# Patient Record
Sex: Female | Born: 1960 | Race: White | Hispanic: No | Marital: Married | Smoking: Never smoker
Health system: Southern US, Community
[De-identification: ages and names within clinical notes are randomized; demographics above are authoritative.]

## PROBLEM LIST (undated history)

## (undated) DIAGNOSIS — F32A Depression, unspecified: Secondary | ICD-10-CM

## (undated) DIAGNOSIS — C50919 Malignant neoplasm of unspecified site of unspecified female breast: Secondary | ICD-10-CM

## (undated) DIAGNOSIS — K5090 Crohn's disease, unspecified, without complications: Secondary | ICD-10-CM

## (undated) DIAGNOSIS — Z8614 Personal history of Methicillin resistant Staphylococcus aureus infection: Secondary | ICD-10-CM

## (undated) DIAGNOSIS — J189 Pneumonia, unspecified organism: Secondary | ICD-10-CM

## (undated) DIAGNOSIS — E042 Nontoxic multinodular goiter: Secondary | ICD-10-CM

## (undated) DIAGNOSIS — J45909 Unspecified asthma, uncomplicated: Secondary | ICD-10-CM

## (undated) DIAGNOSIS — F419 Anxiety disorder, unspecified: Secondary | ICD-10-CM

## (undated) DIAGNOSIS — G4730 Sleep apnea, unspecified: Secondary | ICD-10-CM

## (undated) DIAGNOSIS — Z923 Personal history of irradiation: Secondary | ICD-10-CM

## (undated) DIAGNOSIS — M1990 Unspecified osteoarthritis, unspecified site: Secondary | ICD-10-CM

## (undated) DIAGNOSIS — C73 Malignant neoplasm of thyroid gland: Secondary | ICD-10-CM

## (undated) DIAGNOSIS — F329 Major depressive disorder, single episode, unspecified: Secondary | ICD-10-CM

## (undated) DIAGNOSIS — K219 Gastro-esophageal reflux disease without esophagitis: Secondary | ICD-10-CM

## (undated) HISTORY — DX: Malignant neoplasm of unspecified site of unspecified female breast: C50.919

## (undated) HISTORY — PX: COLONOSCOPY: SHX174

## (undated) HISTORY — DX: Nontoxic multinodular goiter: E04.2

---

## 1997-04-25 ENCOUNTER — Other Ambulatory Visit: Admission: RE | Admit: 1997-04-25 | Discharge: 1997-04-25 | Payer: Self-pay | Admitting: *Deleted

## 1997-05-30 ENCOUNTER — Emergency Department (HOSPITAL_COMMUNITY): Admission: EM | Admit: 1997-05-30 | Discharge: 1997-05-30 | Payer: Self-pay | Admitting: Emergency Medicine

## 1997-07-26 ENCOUNTER — Other Ambulatory Visit: Admission: RE | Admit: 1997-07-26 | Discharge: 1997-07-26 | Payer: Self-pay | Admitting: Dermatology

## 1998-05-23 ENCOUNTER — Other Ambulatory Visit: Admission: RE | Admit: 1998-05-23 | Discharge: 1998-05-23 | Payer: Self-pay | Admitting: *Deleted

## 1999-05-17 ENCOUNTER — Other Ambulatory Visit: Admission: RE | Admit: 1999-05-17 | Discharge: 1999-05-17 | Payer: Self-pay | Admitting: *Deleted

## 2000-05-19 ENCOUNTER — Other Ambulatory Visit: Admission: RE | Admit: 2000-05-19 | Discharge: 2000-05-19 | Payer: Self-pay | Admitting: *Deleted

## 2001-06-10 ENCOUNTER — Other Ambulatory Visit: Admission: RE | Admit: 2001-06-10 | Discharge: 2001-06-10 | Payer: Self-pay | Admitting: *Deleted

## 2001-10-22 ENCOUNTER — Encounter: Admission: RE | Admit: 2001-10-22 | Discharge: 2001-10-22 | Payer: Self-pay | Admitting: Gastroenterology

## 2001-10-22 ENCOUNTER — Encounter: Payer: Self-pay | Admitting: Gastroenterology

## 2002-01-14 HISTORY — PX: ANAL FISSURE REPAIR: SHX2312

## 2002-05-03 ENCOUNTER — Encounter (INDEPENDENT_AMBULATORY_CARE_PROVIDER_SITE_OTHER): Payer: Self-pay | Admitting: Specialist

## 2002-05-03 ENCOUNTER — Ambulatory Visit (HOSPITAL_COMMUNITY): Admission: RE | Admit: 2002-05-03 | Discharge: 2002-05-03 | Payer: Self-pay | Admitting: Gastroenterology

## 2002-06-01 ENCOUNTER — Ambulatory Visit (HOSPITAL_COMMUNITY): Admission: AD | Admit: 2002-06-01 | Discharge: 2002-06-01 | Payer: Self-pay | Admitting: General Surgery

## 2002-06-01 ENCOUNTER — Encounter (INDEPENDENT_AMBULATORY_CARE_PROVIDER_SITE_OTHER): Payer: Self-pay | Admitting: Specialist

## 2002-06-07 ENCOUNTER — Encounter: Admission: RE | Admit: 2002-06-07 | Discharge: 2002-06-07 | Payer: Self-pay | Admitting: Gastroenterology

## 2002-06-07 ENCOUNTER — Encounter: Payer: Self-pay | Admitting: Gastroenterology

## 2002-06-11 ENCOUNTER — Encounter: Admission: RE | Admit: 2002-06-11 | Discharge: 2002-06-11 | Payer: Self-pay | Admitting: Gastroenterology

## 2002-06-11 ENCOUNTER — Encounter: Payer: Self-pay | Admitting: Gastroenterology

## 2002-06-29 ENCOUNTER — Ambulatory Visit (HOSPITAL_COMMUNITY): Admission: RE | Admit: 2002-06-29 | Discharge: 2002-06-29 | Payer: Self-pay | Admitting: Gastroenterology

## 2002-07-13 ENCOUNTER — Encounter (HOSPITAL_COMMUNITY): Admission: RE | Admit: 2002-07-13 | Discharge: 2002-10-11 | Payer: Self-pay | Admitting: Gastroenterology

## 2002-08-24 ENCOUNTER — Other Ambulatory Visit: Admission: RE | Admit: 2002-08-24 | Discharge: 2002-08-24 | Payer: Self-pay | Admitting: *Deleted

## 2003-06-15 ENCOUNTER — Ambulatory Visit (HOSPITAL_COMMUNITY): Admission: RE | Admit: 2003-06-15 | Discharge: 2003-06-15 | Payer: Self-pay | Admitting: Gastroenterology

## 2003-06-15 ENCOUNTER — Encounter (INDEPENDENT_AMBULATORY_CARE_PROVIDER_SITE_OTHER): Payer: Self-pay | Admitting: Specialist

## 2003-06-17 ENCOUNTER — Encounter (HOSPITAL_COMMUNITY): Admission: RE | Admit: 2003-06-17 | Discharge: 2003-09-14 | Payer: Self-pay | Admitting: Gastroenterology

## 2003-10-14 ENCOUNTER — Encounter (HOSPITAL_COMMUNITY): Admission: RE | Admit: 2003-10-14 | Discharge: 2003-10-18 | Payer: Self-pay | Admitting: Gastroenterology

## 2004-06-25 ENCOUNTER — Emergency Department (HOSPITAL_COMMUNITY): Admission: EM | Admit: 2004-06-25 | Discharge: 2004-06-25 | Payer: Self-pay | Admitting: Emergency Medicine

## 2004-06-26 ENCOUNTER — Inpatient Hospital Stay (HOSPITAL_COMMUNITY): Admission: AD | Admit: 2004-06-26 | Discharge: 2004-06-28 | Payer: Self-pay | Admitting: Gastroenterology

## 2004-06-28 ENCOUNTER — Encounter (INDEPENDENT_AMBULATORY_CARE_PROVIDER_SITE_OTHER): Payer: Self-pay | Admitting: *Deleted

## 2004-10-17 ENCOUNTER — Encounter: Admission: RE | Admit: 2004-10-17 | Discharge: 2004-10-17 | Payer: Self-pay | Admitting: Gastroenterology

## 2005-01-14 DIAGNOSIS — K5090 Crohn's disease, unspecified, without complications: Secondary | ICD-10-CM | POA: Insufficient documentation

## 2005-11-21 ENCOUNTER — Encounter: Admission: RE | Admit: 2005-11-21 | Discharge: 2005-11-21 | Payer: Self-pay | Admitting: Orthopedic Surgery

## 2006-04-14 ENCOUNTER — Other Ambulatory Visit: Admission: RE | Admit: 2006-04-14 | Discharge: 2006-04-14 | Payer: Self-pay | Admitting: Internal Medicine

## 2008-01-15 DIAGNOSIS — L853 Xerosis cutis: Secondary | ICD-10-CM | POA: Insufficient documentation

## 2008-04-29 ENCOUNTER — Ambulatory Visit: Payer: Self-pay | Admitting: Internal Medicine

## 2008-04-29 ENCOUNTER — Other Ambulatory Visit: Admission: RE | Admit: 2008-04-29 | Discharge: 2008-04-29 | Payer: Self-pay | Admitting: Internal Medicine

## 2008-05-27 ENCOUNTER — Ambulatory Visit: Payer: Self-pay | Admitting: Internal Medicine

## 2008-07-11 ENCOUNTER — Ambulatory Visit: Payer: Self-pay | Admitting: Internal Medicine

## 2009-03-06 ENCOUNTER — Ambulatory Visit: Payer: Self-pay | Admitting: Internal Medicine

## 2009-03-14 ENCOUNTER — Ambulatory Visit (HOSPITAL_BASED_OUTPATIENT_CLINIC_OR_DEPARTMENT_OTHER): Admission: RE | Admit: 2009-03-14 | Discharge: 2009-03-14 | Payer: Self-pay | Admitting: Orthopedic Surgery

## 2009-11-03 ENCOUNTER — Ambulatory Visit: Payer: Self-pay | Admitting: Cardiovascular Disease

## 2010-01-14 DIAGNOSIS — H409 Unspecified glaucoma: Secondary | ICD-10-CM | POA: Insufficient documentation

## 2010-02-04 ENCOUNTER — Encounter: Payer: Self-pay | Admitting: Orthopedic Surgery

## 2010-02-04 ENCOUNTER — Encounter: Payer: Self-pay | Admitting: Gastroenterology

## 2010-02-12 ENCOUNTER — Ambulatory Visit
Admission: RE | Admit: 2010-02-12 | Discharge: 2010-02-12 | Payer: Self-pay | Source: Home / Self Care | Attending: Internal Medicine | Admitting: Internal Medicine

## 2010-03-02 ENCOUNTER — Ambulatory Visit: Payer: Self-pay | Admitting: Internal Medicine

## 2010-04-05 ENCOUNTER — Other Ambulatory Visit: Payer: Self-pay | Admitting: Cardiology

## 2010-04-05 ENCOUNTER — Ambulatory Visit (INDEPENDENT_AMBULATORY_CARE_PROVIDER_SITE_OTHER): Payer: Commercial Managed Care - PPO | Admitting: *Deleted

## 2010-04-05 DIAGNOSIS — R0789 Other chest pain: Secondary | ICD-10-CM

## 2010-04-05 DIAGNOSIS — Z79899 Other long term (current) drug therapy: Secondary | ICD-10-CM

## 2010-04-05 DIAGNOSIS — E7800 Pure hypercholesterolemia, unspecified: Secondary | ICD-10-CM

## 2010-04-06 LAB — COMPREHENSIVE METABOLIC PANEL
ALT: 14 U/L (ref 0–35)
Albumin: 4.4 g/dL (ref 3.5–5.2)
Calcium: 9.3 mg/dL (ref 8.4–10.5)
Creat: 0.74 mg/dL (ref 0.40–1.20)
Glucose, Bld: 81 mg/dL (ref 70–99)
Sodium: 138 mEq/L (ref 135–145)
Total Protein: 7.2 g/dL (ref 6.0–8.3)

## 2010-04-06 LAB — CBC WITH DIFFERENTIAL/PLATELET
Basophils Absolute: 0.1 10*3/uL (ref 0.0–0.1)
HCT: 41 % (ref 36.0–46.0)
Hemoglobin: 12.9 g/dL (ref 12.0–15.0)
Lymphs Abs: 1.9 10*3/uL (ref 0.7–4.0)
MCH: 30.6 pg (ref 26.0–34.0)
Neutrophils Relative %: 46 % (ref 43–77)
Platelets: 251 10*3/uL (ref 150–400)
WBC: 4.7 10*3/uL (ref 4.0–10.5)

## 2010-04-06 LAB — ALKALINE PHOSPHATASE: Alkaline Phosphatase: 36 U/L — ABNORMAL LOW (ref 39–117)

## 2010-04-06 LAB — LIPID PANEL
Cholesterol: 217 mg/dL — ABNORMAL HIGH (ref 0–200)
HDL: 76 mg/dL (ref >39)
LDL Cholesterol: 132 mg/dL — ABNORMAL HIGH (ref 0–99)
Triglycerides: 44 mg/dL (ref <150)

## 2010-04-06 NOTE — Progress Notes (Signed)
Quick Note:  Blood work ok ______

## 2010-04-08 LAB — ANAEROBIC CULTURE: Gram Stain: NONE SEEN

## 2010-04-08 LAB — TISSUE CULTURE: Gram Stain: NONE SEEN

## 2010-04-08 LAB — WOUND CULTURE

## 2010-06-01 NOTE — Op Note (Signed)
[name], BELKNAP                        ACCOUNT NO.:  [id number]   MEDICAL RECORD NO.:  [id number]                   PATIENT TYPE:  AMB   LOCATION:  ENDO                                 FACILITY:  [facility]   PHYSICIAN:  Ronald Lobo, M.D.                DATE OF BIRTH:  [birth year]   DATE OF PROCEDURE:  06/15/2003  DATE OF DISCHARGE:                                 OPERATIVE REPORT   PROCEDURE:  Colonoscopy with biopsies.   INDICATION:  This is a 50 year old female with a history of significant  Crohn's colitis, responding in the past to Remicade.  She is maintained on  6MP.  She had been doing well for the past year or so, with her most recent  colonoscopy seven months ago showing no colitis activity whatsoever.  However, over the past couple of months, she has had a gradual but  progressive recrudescence of symptoms, with crampy abdominal discomfort,  increased frequency of bowel movements, and passage of some bloody mucous.   FINDINGS:  Distal proctitis with minimal inflammation of the ileocecal  valve.   PROCEDURE:  The nature, purpose and risks of the procedure were familiar to  the patient from prior examinations and she provided written consent.  Sedation was fentanyl 112.5 mcg and Versed 11 mg IV without arrhythmias or  desaturation.  The patient was comfortably and adequately sedated throughout  the procedure.  Perianal exam was unremarkable, specifically no fissures,  fistulae, abscess or other abnormalities were identified, just some skin  tags.   The Olympus adjustable tension pediatric video colonoscope was advanced to  the terminal ileum, turning the patient into the supine position and  applying some external abdominal compression to control looping.   The terminal ileum was entered for a distance of about 5-10 cm and had a  normal appearance.  Biopsies were obtained for histologic confirmation.   In the cecum, there was a little bit of erythema with minimal  granularity  and exudate around the orifice of the appendix, and the ileocecal valve,  especially on its roof, had granularity, exudate and superficial  ulcerations.  That was biopsied as well.   __________ was then performed around the colon.  As before, I saw some  inflammatory nodules in the mid colon.   The patient had a solitary diverticulum observed in the sigmoid region.   The only other area of inflammation apart from the cecum was in the distal  rectum, from the anal verge in a confluent fashion up to about 10 cm,  characterized by fairly intense erythema, exudate and granularity, probably  a severity of 6, or perhaps even 7, on a scale of 10.  Above 10 cm, the  proximal rectum looked normal.   Several biopsies were obtained from the normal and the inflamed sections of  the rectum for comparison.  Retroflexion was not performed in the rectum.   The patient tolerated the  procedure well and there were no apparent  complications.   IMPRESSION:  1. Crohn's colitis, with moderate but very localized activity at the present     time, confined to the distal rectum and the ileocecal valve and     appendiceal orifice (555.1).  2. Minimal diverticular change.   PLAN:  Consider aggressive topical therapy for the proctitis with  consideration of Remicade if the patient continues to have symptoms of  malaise, abdominal discomfort, hip pain, etc.                                               Ronald Lobo, M.D.    RB/MEDQ  D:  06/15/2003  T:  06/15/2003  Job:  [id number]   cc:   Cresenciano Lick. Renold Genta, M.D.  [address] [place]  [place] [address]  Fax: [phone]

## 2010-06-01 NOTE — Op Note (Signed)
[name], [name]                        ACCOUNT NO.:  [id number]   MEDICAL RECORD NO.:  [id number]                   PATIENT TYPE:  AMB   LOCATION:  DAY                                  FACILITY:  [facility]   PHYSICIAN:  Timothy E. Rosana Hoes, M.D.              DATE OF BIRTH:  01-Nov-1960   DATE OF PROCEDURE:  06/01/2002  DATE OF DISCHARGE:                                 OPERATIVE REPORT   PREOPERATIVE DIAGNOSES:  1. Multiple perirectal abscesses.  2. Anal fissure Crohn's disease.   POSTOPERATIVE DIAGNOSES:  1. Multiple perirectal abscesses.  2. Anal fissure Crohn's disease.   PROCEDURES:  1. Examination under anesthesia.  2. Multiple biopsies.  3. Incision and drainage of abscesses.   SURGEON:  Timothy E. Rosana Hoes, M.D.   ANESTHESIA:  General.   CLINICAL HISTORY:  I met the patient yesterday afternoon.  She has recently  been diagnosed with Crohn's disease and placed on oral medications.  In  spite of that, for the past several weeks she has felt not well, has eaten  little, and has had intensifying anorectal pain.  The past three days she  had had the appearance of an abscess with partial spontaneous drainage.  When seen in the office, the abscesses were noted, the anal fissure was  noted, and drainage was recommended.  She is in agreement, as well her  husband, [name], M.D.   DESCRIPTION OF PROCEDURE:  No prep was accomplished.  The patient was  identified and the permit signed.  Her laboratory data were noted with a low  albumin, low potassium, low hemoglobin and hematocrit.   She was taken to the operating room and placed supine, LMA anesthesia  provided.  She was placed in lithotomy.  The perianal area inspected,  prepped and draped in the usual fashion.  Careful inspection revealed no  involvement of the vagina.  Apparently there was a right anterior perirectal  abscess in the 10 o'clock position.  A gentle probe with a malleable probe  revealed no evidence  of fistulization.  There was a tiny abscess in the left  lateral position, and this was just outside the intense left lateral  fissure.  Again probing revealed no evidence of fistulization.  The fissure  was intense.  The base was approximately 15 mm across.  The edges were  heaped up between 5 and 8 mm over the edges of this juicy granulomatous  tissue typical of Crohn's disease.  The fissure was over 3 cm in  length.  I  was able to introduce an anoscope without difficulty and examine this more  closely.  I made two biopsies of the granulation tissue around the fissure.  I also biopsied the right anterior abscess cavity.  These were all submitted  for routine pathology.   I incised and drained the right anterior abscess, debrided it, and then  cauterized the base.  The left  lateral abscess was opened only for a  distance of about 6-7 mm, and it was as well debrided.  I was able to  cauterize the areas on the fissure internally where the biopsies were made,  and I provided no further treatment for the fissure at this time.  The  wounds were dry.  The patient was stable.  Gelfoam, gauze, and dry sterile  dressing applied.  She tolerated it well, was removed to the recovery room  in good condition.   The patient has Vicodin and antibiotic to take at home.  She will be  followed closely in the office.                                               Timothy E. Rosana Hoes, M.D.    TED/MEDQ  D:  06/01/2002  T:  06/01/2002  Job:  [id number]   cc:   Ronald Lobo, M.D.  [address] [place]  [place], [place] [address]  Fax: [phone]

## 2010-06-01 NOTE — Op Note (Signed)
[name], [name]              ACCOUNT NO.:  [id number]   MEDICAL RECORD NO.:  [id number]          PATIENT TYPE:  INP   LOCATION:  5708                         FACILITY:  [facility]   PHYSICIAN:  Ronald Lobo, M.D.   DATE OF BIRTH:  [birth year]   DATE OF PROCEDURE:  06/28/2004  DATE OF DISCHARGE:                                 OPERATIVE REPORT   PROCEDURE:  Colonoscopy with biopsies.   INDICATIONS:  This is a 50 year old female with a two-year history of  Crohn's colitis, managed over the past 6 months with Humira, with very good  control of symptoms until about a month ago when she began noticing an  increased frequency of bowel movements and some degree of looseness of  stools, without bleeding, significant pain, etc.. She was admitted to the  hospital two days ago with a 48-hour history of right upper quadrant  abdominal pain and thoracic pleuritic pain of unclear cause which, in  retrospect, might be due to be a viral pleuritis.   FINDINGS:  Essentially normal exam.   PROCEDURE:  The nature, purpose, and risks of the procedure were familiar to  the patient from prior examinations and she provided written consent.  Sedation was fentanyl 50 mcg, Versed 7.5 milligrams and Phenergan 12.5  milligrams IV without arrhythmias or desaturation. The Olympus adjustable  tension pediatric video colonoscope was advanced without significant  difficulty to the terminal ileum which had a normal appearance. It was  examined for perhaps 5 to 8 cm or so and appeared normal. Pullback was then  performed. The quality of prep was excellent and it is felt that all areas  were well seen. There was a solitary left side diverticulum and perhaps a  few tiny inflammatory polyps or nodules near the distal transverse colon,  but apart from that, this was a remarkably normal examination with good  vascularity and no evidence of colitis activity such as erythema, loss of  vascularity, exudate, ulcerations,  granularity, pseudomembranes, etc..   Random biopsies were obtained from the terminal ileum and around the colon  for histologic analysis.   No obvious colonic polyps nor any masses were observed. I did not see any  vascular ectasia. Retroflexion the rectum was attempted but could not be  accomplished due to a small rectal ampulla.   The patient tolerated the procedure well and there no apparent  complications.   IMPRESSION:  1.  Crohn's colitis, currently endoscopically quiescent (555.1).  2.  Solitary left side diverticulum, perhaps a few tiny inflammatory polyps      present.  3.  No obvious source for one-month history of altered bowel habit noted.   PLAN:  Await pathology results. Continue Humira for now.       RB/MEDQ  D:  06/28/2004  T:  06/28/2004  Job:  [id number]   cc:   Cresenciano Lick. Renold Genta, M.D.  [address] [place] [address]  Fax: [phone]   Dr. Kristie Cowman, [facility] [place]

## 2010-06-01 NOTE — Discharge Summary (Signed)
NAMELENELL, MCCONNELL              ACCOUNT NO.:  [id number]   MEDICAL RECORD NO.:  [id number]          PATIENT TYPE:  INP   LOCATION:  5708                         FACILITY:  [facility]   PHYSICIAN:  Ronald Lobo, M.D.   DATE OF BIRTH:  1960/05/18   DATE OF ADMISSION:  06/26/2004  DATE OF DISCHARGE:  06/28/2004                                 DISCHARGE SUMMARY   FINAL DIAGNOSES:  1.  Right upper quadrant pain and right thoracic pain, etiology      undetermined, resolved.  2.  Small pleural effusion with right lower lobe atelectasis.  3.  Crohn's colitis of two years' duration, currently quiescent.  4.  Past history of questionable achalasia without radiographic confirmation      on most recent testing.   CONSULTATIONS:  Danton Sewer, M.D., pulmonary medicine.   COMPLICATIONS:  None.   PROCEDURES:  1.  CT of chest, abdomen and pelvis.  2.  Perfusion scan of the lungs.  3.  Colonoscopy with biopsies.   LABORATORY DATA:  Admission white count was 9500, discharge white count  5800.  Admission differential unremarkable with 77 polys, 13 lymphs.  Hemoglobin 13.1 on admission, 11.7 following hydration.  Platelets normal at  221,000.  Sedimentation rate slightly elevated at 31.  Chemistry panel  essentially within normal limits with BUN of 4 on admission, creatinine 0.7.  Liver chemistries normal.  Albumin 3.5 on admission and 2.6 prior to  discharge.  Lipase repeatedly normal.  Urinalysis showed moderate dipstick  blood but just 0-2 red cells, and this was felt most likely to be perineal  contamination from menses.   HOSPITAL COURSE:  [name] was admitted urgently because of a two-day history  of fairly significant right upper quadrant pleuritic pain which came on  rather abruptly and was associated with some degree of shortness of breath  and food intolerance.  As an outpatient in the emergency room, the patient  had had an ultrasound  that was negative for stones despite a family  history  of gallbladder disease, and a hepatobiliary scan that was also negative for  any evidence of acute biliary obstruction or cholecystitis.  Despite normal  objective testing such as white count and the absence of fever, in view of  the ongoing symptoms and considering the patient is on immunosuppressive  therapy with Humira (antitumor necrosis factor antibody), careful inpatient  monitoring was felt to be warranted, not to mention the need for better  control of symptoms of pain.   On admission, the patient had impressive guarding of the right upper  quadrant.  A CT of the chest, abdomen and pelvis was obtained.  The CT of  the chest was done on the pulmonary embolism protocol and was negative for  any evidence of PEs but a small pleural effusion was noted along with some  atelectasis in the right lower lobe, raising impression is to whether some  very small subsegmental emboli could have occurred.  Venous Dopplers of the  lower extremities were performed and were negative and prior to discharge,  after obtaining pulmonary consultation, a perfusion scan of  the lungs was  obtained which was negative.  Thus, it is felt that pulmonary embolism, in  particular of any significance, was highly unlikely in view of the overall  picture.   The CT of the abdomen and pelvis did not show any bowel abnormalities or  evidence of colitis activity or other problems to account for the patient's  symptoms.   Fortunately, during the patient's 48-hour stay, there was a progressive  decrease in symptoms such that she did not need pain medication nor have any  night sweats on the evening prior to discharge and by the day of discharge,  her abdomen was completely nontender.   A colonoscopy was performed prior to discharge in view of the fact that, for  the preceding month, the patient had had an increased frequency of bowel  movements, but this exam was really normal all the way to the terminal   ileum, with random biopsies pending.   In view of the patient's clinical improvement and the absence of any evident  disease process, discharge was felt to be clinically appropriate.   TESTS PENDING AT TIME OF DISCHARGE:  Blood culture, PPD (negative at 24  hours).   DISPOSITION:  Discharge home.  Regular diet.  Office follow-up with me will  probably be arranged for a month or two from now to monitor the patient's  progress.   DISCHARGE MEDICATIONS:  The patient will continue her outpatient  medications, which included:  1.  Humira 40 milligrams subcutaneously once weekly.  2.  Zoloft 50 mg daily.  3.  Yasmin  on a daily basis.   COMMENT:  On balance, it is felt that this was most likely a viral pleuritis  but no definite or specific diagnosis could ever be proven in this patient.   CONDITION ON DISCHARGE:  Improved.       RB/MEDQ  D:  06/28/2004  T:  06/28/2004  Job:  [id number]   cc:   Cresenciano Lick. Renold Genta, M.D.  [address]  [place]  [place] [address]  Fax: [phone]   Danton Sewer, M.D. [facility]   Dr. Ellender Hose

## 2010-06-01 NOTE — H&P (Signed)
[name], [name] NO.:  [id number]   MEDICAL RECORD NO.:  [id number]          PATIENT TYPE:  INP   LOCATION:  5708                         FACILITY:  [facility]   PHYSICIAN:  Ronald Lobo, M.D.   DATE OF BIRTH:  [birth year]   DATE OF ADMISSION:  06/26/2004  DATE OF DISCHARGE:                                HISTORY & PHYSICAL   This 50 year old female is being admitted to the hospital because of  abdominal pain.   [name] has a two-year history of inflammatory bowel disease felt most likely  to be Crohn colitis, for which therapy over the past couple of years has  included 6-MP, Remicade, and methotrexate.  Due to medically refractory  disease, she was placed on Humira last November through Dr. Ellender Hose at [facility] and has had a fairly good response.  Over the past month or so, she has  had a mild flare up of symptoms characterized by low abdominal cramps and  diarrhea three or four times a day without bleeding or nocturnal  symptomatology and without any significant decrement in her normal  functional status, in that she is able, for example, to continue playing  tennis on a regular basis, although she does not feel she has quite her  usual pep and energy.  Note that the patient had been co-treated with  prednisone for several months and this was tapered to zero as of about six  weeks ago.  With that background, approximately 48 hours ago the patient had the fairly  abrupt onset of rather diffuse right sided abdominal pain involving the  right upper quadrant of the abdomen and the right chest with some pleuritic  right sided chest pain, although nothing really dramatic, noted only on deep  breathing.  She might have some slight exertional dyspnea but it is hard to  tell whether she is primarily short of breath or the chest discomfort cuts  her breath off somewhat but again this is a relatively moderate symptom for  the patient.  It does hurt for her to move  around, sit up, reach over, and  so forth suggesting a musculoskeletal component to exacerbation of these  symptoms whereas they do not seem to be related to food intake.  However,  she really has not eaten much over the past two days since the symptoms  began.  Yesterday afternoon, she had a temp of 100.5 degrees.  She has not been  coughing, nor has she been around anybody with respiratory ailments.  Because of these symptoms, the patient was directed yesterday afternoon to  the emergency room where she was evaluated and noted to have normal  laboratories including a normal CBC and normal liver chemistries.  An  ultrasound was obtained which showed no gallstones, although the patient has  a family history of gallbladder disease, so she was then sent for a  hepatobiliary scan which was also normal.  She was sent home on Percocet but  has continued to have pain especially when the Percocet wears off, and  really she does not feel any better.  Because of  these symptoms, inpatient  evaluation was felt to be warranted.   PAST MEDICAL HISTORY:  No known allergies.   OUTPATIENT MEDICATIONS:  1.  Yasmin (this is her week of plain pills).  2.  Zoloft 50 mg daily.  3.  Humira 40 mg subcutaneously once a week.   OPERATIONS:  No abdominal surgery but has had drainage of a perirectal  abscess, by Dr. Lennie Hummer, approximately two years ago.   MEDICAL ILLNESSES:  1.  There is a questionable history of achalasia based on dysphagia symptoms      and an abnormal barium swallow at the [facility] about ten years      ago; however, a more recent barium swallow done here about seven years      ago did not show evidence of achalasia.  The patient has had a solitary      episode of a food impaction with a questionable esophageal ring.  No      known cardiopulmonary disease, diabetes, or hypertension, or other      medical illnesses.  2.  She has been treated with Zoloft for some depressive  symptomatology.  3.  She had Clostridium difficile colitis in August 2001.   HABITS:  I believe the patient is both a nonsmoker and just occasional user  of ethanol.   FAMILY HISTORY:  Positive for gallbladder disease.   SOCIAL HISTORY:  Married, homemaker.   REVIEW OF SYSTEMS:  No problem with weight loss of loss of appetite.  On the  contrary, has been struggling not to gain weight especially on the  prednisone.  No recent rectal bleeding but has had a mild flare up of bowel  symptoms over the past month as noted.  The flare up of abdominal symptoms  does not seem to have increased over the past 48 hours since she developed  this right sided discomfort as described above.   PHYSICAL EXAMINATION:  GENERAL:  [name] is a healthy-appearing female in no  evident distress and appearing neither anxious or depressed.  VITAL SIGNS:  Temperature 98.2, blood pressure 109/58, pulse 66,  respirations 20 and unlabored.  HEENT:  She is anicteric.  SKIN:  Rather pale but warm and dry.  Note that this is despite a normal  hemoglobin of 13.1 when checked in the emergency room yesterday.  CHEST:  Clear to auscultation without any wheezes or pleural rubs.  HEART:  Normal without gallops, rubs, murmurs, clicks, or arrhythmias.  ABDOMEN:  Has bowel sounds present without any obvious bruits but there is  quite a bit of guarding and firmness and tenderness on the right side of the  abdomen, especially the right upper quadrant without any discrete mass  effect or peritoneal findings.   LABS:  Obtained in the emergency room approximately 24 hours ago, these  showed a white count of 9500, hemoglobin 13.1, platelets 221,000,  differential normal with 77% polys, 13 lymphs, and 10 monocytes.  Chemistries unremarkable with particular reference to liver chemistries  which were entirely within normal limits and albumin was normal at 3.5,  lipase normal at 27.  Urinalysis showed mild ketones but was positive  for DIP stick blood but only 0-2 red cells (note that the patient is on her  period this week).   IMPRESSION:  1.  Right sided upper quadrant and lower chest pain with questionable      pleuritic component.  The differential diagnosis might include pulmonary      embolism (for which  she is only at risk based on her Crohn disease, no      factors such as obesity, immobility, recent surgery, etcetera),      acalculous gallbladder disease, or perhaps an atypical manifestation of      renal colic.  2.  Crohn colitis of about two years' duration on immunosuppression with      Humira (anti TNF).  3.  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  4.  DIP stick hematuria, probably due to contamination from perineal blood.   PLAN:  1.  CT of chest, abdomen, and pelvis.  2.  If the above is unrevealing, get updated blood work and arrange for a      CCK stimulated hepatobiliary scan.  3.  Surgical consultation will be considered if appropriate.       RB/MEDQ  D:  06/26/2004  T:  06/26/2004  Job:  [id number]   cc:   Cresenciano Lick. Renold Genta, M.D.  [address] [place] [address]  Fax: [phone]   Kristie Cowman, Dr.  Division of Digestive Diseases  [facility] [place], [place] [address]

## 2010-06-01 NOTE — Op Note (Signed)
[name], [name]                        ACCOUNT NO.:  [id number]   MEDICAL RECORD NO.:  [id number]                   PATIENT TYPE:  AMB   LOCATION:  ENDO                                 FACILITY:  [facility]   PHYSICIAN:  Ronald Lobo, M.D.                DATE OF BIRTH:  29-Mar-1960   DATE OF PROCEDURE:  05/03/2002  DATE OF DISCHARGE:                                 OPERATIVE REPORT   PROCEDURE PERFORMED:  Colonoscopy with biopsies.   ENDOSCOPIST:  Cleotis Nipper, M.D.   INDICATIONS FOR PROCEDURE:  The patient is a 50 year old female with  longstanding history of what was thought to be irritable bowel syndrome who  recently was found to have inflammatory changes on a colonoscopy done as  part of preparation for an IBS study.  She has been placed on Asacol and  various other medications but so far has had progression of symptoms.  Further clarification of her status was felt to be warranted.   FINDINGS:  Patchy colitis consistent with a mixed picture of Crohn's colitis  and ulcerative colitis.   DESCRIPTION OF PROCEDURE:  The patient provided written consent for the  procedure.  Sedation was fentanyl 110 mcg and Versed 14 mg IV prior to and  during the course of the procedure without arrhythmias or desaturation.   The Olympus adjustable tension pediatric video colonoscope was advanced  around the colon to the terminal ileum which was entered for a short  distance and had a normal appearance.  Advancement was fairly easily  accomplished.  Biopsies were obtained of the terminal ileum prior to  pullback.  There were patchy inflammatory changes throughout the colon, as  described below.   In the ascending colon, the surrounding mucosa was normal but there was a  deep 2 to 3 cm diameter ulceration with surrounding mucosal erythema and  edema.  This solitary ulceration was the only inflammatory change in the  proximal ascending colon.  In the region of the hepatic flexure  and  transverse colon, there was a fairly lengthy (probably 30 cm) segment of  colitis with the appearance of ulcerative colitis, characterized by loss of  mucosal vascularity, and the presence of punctate erythema, and granularity.  There was no significant exudate, no significant friability, so I would rate  this roughly a level 3 on a scale of 10 in terms of severity of  inflammation.  In the left colon, the mucosa was more normal in appearance  if not frankly normal.  There was preserved vascularity.  There was a rare  diverticulum in the left colon.   In the rectum, inflammatory changes were again encountered with mild distal  proctitis (level 2 out of 10) with essentially no inflammation in the  proximal rectum.  Biopsies were obtained from the ulceration in the inflamed  segment and the terminal ileum.  Retroflexion was not performed in the  rectum.  The patient did have a large skin tag versus sentinel pile in the  perianal area.   The patient did not have any obvious fissures, fistulae or abscesses in the  perianal area although a detailed inspection was not performed.  The patient  tolerated the procedure well and there were no apparent complications.   IMPRESSION:  Nonspecific colitis as described above.  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    PLAN:  Await pathology on today's biopsies and intensify therapy by adding  Entocort 9 mg each morning and increasing Asacol from eight  tablets a day  to 12 tablets a day if she is not already on that dose.                                                Ronald Lobo, M.D.    RB/MEDQ  D:  05/03/2002  T:  05/04/2002  Job:  [id number]   cc:   Cresenciano Lick. Renold Genta, M.D.  [address] [place]  [place] [address]  Fax: [phone]

## 2010-08-07 ENCOUNTER — Encounter: Payer: Self-pay | Admitting: Internal Medicine

## 2010-08-23 ENCOUNTER — Encounter: Payer: Self-pay | Admitting: Internal Medicine

## 2010-12-17 DIAGNOSIS — K621 Rectal polyp: Secondary | ICD-10-CM

## 2010-12-17 DIAGNOSIS — K620 Anal polyp: Secondary | ICD-10-CM | POA: Insufficient documentation

## 2010-12-17 DIAGNOSIS — K639 Disease of intestine, unspecified: Secondary | ICD-10-CM | POA: Insufficient documentation

## 2011-01-21 ENCOUNTER — Ambulatory Visit (INDEPENDENT_AMBULATORY_CARE_PROVIDER_SITE_OTHER): Payer: Commercial Managed Care - PPO | Admitting: Pharmacist

## 2011-01-21 ENCOUNTER — Encounter: Payer: Self-pay | Admitting: Pharmacist

## 2011-01-21 DIAGNOSIS — L853 Xerosis cutis: Secondary | ICD-10-CM

## 2011-01-21 DIAGNOSIS — K5090 Crohn's disease, unspecified, without complications: Secondary | ICD-10-CM

## 2011-01-21 DIAGNOSIS — L258 Unspecified contact dermatitis due to other agents: Secondary | ICD-10-CM

## 2011-01-21 DIAGNOSIS — H409 Unspecified glaucoma: Secondary | ICD-10-CM

## 2011-01-21 NOTE — Assessment & Plan Note (Signed)
Following medication review, no suggestions for change.  Complete medication list provided to patient.  Total time in face to face medication review: 20 minutes.  Patient seen with: James Ivanoff, PharmD Candidate and Laurey Morale, Pharmacy Resident

## 2011-01-21 NOTE — Progress Notes (Signed)
  Subjective:    Patient ID: [name], female    DOB: 1960/02/03, 51 y.o.   MRN: [id number]  HPI Patient arrives in good spirits for medication review.  Reports seeing Dr. Tommie Ard [name] as primary care provider, Dr. Kristie Cowman [facility]) and Dr. Cristina Gong for GI, Dr. Ubaldo Glassing for dematology, Dr. Earl Gala for ophthalmology. Reports being diagnosed with Crohn's since 2007 and states she is under acceptable level of control.      Review of Systems     Objective:   Physical Exam        Assessment & Plan:  Following medication review, no suggestions for change.  Complete medication list provided to patient.  Total time in face to face medication review: 20 minutes.  Patient seen with: James Ivanoff, PharmD Candidate and Laurey Morale, Pharmacy Resident

## 2011-01-21 NOTE — Progress Notes (Signed)
  Subjective:    Patient ID: [name], female    DOB: 1960-12-12, 51 y.o.   MRN: [id number]  HPI  Reviewed and agree with Dr. Graylin Shiver management.     Review of Systems     Objective:   Physical Exam        Assessment & Plan:

## 2012-02-07 ENCOUNTER — Ambulatory Visit: Payer: Commercial Managed Care - PPO | Admitting: Pharmacist

## 2012-02-13 ENCOUNTER — Encounter: Payer: Self-pay | Admitting: Pharmacist

## 2012-02-13 ENCOUNTER — Ambulatory Visit (INDEPENDENT_AMBULATORY_CARE_PROVIDER_SITE_OTHER): Payer: Self-pay | Admitting: Pharmacist

## 2012-02-13 VITALS — BP 99/56 | HR 69 | Ht 63.0 in | Wt 138.0 lb

## 2012-02-13 DIAGNOSIS — K5090 Crohn's disease, unspecified, without complications: Secondary | ICD-10-CM

## 2012-02-13 NOTE — Assessment & Plan Note (Signed)
Following medication review, no suggestions for change.  Complete medication list provided to patient.  Total time in face to face medication review: 12 minutes.  Patient seen with: Doris Cheadle PharmD, Pharmacy Resident.

## 2012-02-13 NOTE — Patient Instructions (Addendum)
Thanks for coming in today!

## 2012-02-13 NOTE — Progress Notes (Signed)
  Subjective:    Patient ID: [name], female    DOB: February 11, 1960, 52 y.o.   MRN: [id number]  HPI  Patient arrives in good spirits for medication review. Reports seeing Dr. Eden Emms [name] as primary care provider, Dr. Cedric Fishman [facility]) for GI, Dr. Nicholas Lose for dermatology, Dr. Abel Presto for ophthalmology, Dr. Mitzi Hansen with [facility]. Reports being diagnosed with Crohn's since 2007 and states she is under acceptable level of control.   Review of Systems     Objective:   Physical Exam        Assessment & Plan:  Following medication review, no suggestions for change.  Complete medication list provided to patient.  Total time in face to face medication review: 12 minutes.  Patient seen with: Doris Cheadle PharmD, Pharmacy Resident.

## 2012-02-14 NOTE — Progress Notes (Signed)
Patient ID: [name], female   DOB: December 05, 1960, 52 y.o.   MRN: [id number] Reviewed:  Agree with Dr Graylin Shiver documentation and management.

## 2012-08-13 ENCOUNTER — Other Ambulatory Visit: Payer: Self-pay | Admitting: Dermatology

## 2013-11-15 ENCOUNTER — Other Ambulatory Visit: Payer: Self-pay | Admitting: Radiology

## 2013-11-24 ENCOUNTER — Telehealth: Payer: Self-pay

## 2013-11-24 NOTE — Telephone Encounter (Signed)
Patient called requesting hepatitis b vaccine.  She had some labs done that show she is not immune to hep b.  She would like to know if this is something she should get.  Her [facility] providers advised her to do so.  Please advise.

## 2013-11-24 NOTE — Telephone Encounter (Signed)
I have contacted her and told her it would be a 3 dose series over 6 months. I believe we have one dose here. Please check and if so, she may have that dose. Once given order one more dose to have on hand.  Second dose in 2 months.

## 2013-11-25 ENCOUNTER — Ambulatory Visit (INDEPENDENT_AMBULATORY_CARE_PROVIDER_SITE_OTHER): Payer: 59 | Admitting: Internal Medicine

## 2013-11-25 DIAGNOSIS — Z23 Encounter for immunization: Secondary | ICD-10-CM

## 2014-01-24 ENCOUNTER — Encounter: Payer: Self-pay | Admitting: Internal Medicine

## 2014-01-24 ENCOUNTER — Ambulatory Visit (INDEPENDENT_AMBULATORY_CARE_PROVIDER_SITE_OTHER): Payer: 59 | Admitting: Internal Medicine

## 2014-01-24 VITALS — BP 96/62 | HR 78 | Temp 98.2°F | Wt 138.0 lb

## 2014-01-24 DIAGNOSIS — Z23 Encounter for immunization: Secondary | ICD-10-CM

## 2014-01-24 DIAGNOSIS — F329 Major depressive disorder, single episode, unspecified: Secondary | ICD-10-CM

## 2014-01-24 DIAGNOSIS — F32A Depression, unspecified: Secondary | ICD-10-CM

## 2014-01-24 NOTE — Patient Instructions (Addendum)
Start Zoloft 50 mg daily and call me in 4 weeks with progress report. Soak toe in [place] salt water nightly.

## 2014-01-27 ENCOUNTER — Ambulatory Visit: Payer: 59 | Admitting: Internal Medicine

## 2014-02-01 ENCOUNTER — Other Ambulatory Visit: Payer: Self-pay | Admitting: Internal Medicine

## 2014-02-01 ENCOUNTER — Telehealth: Payer: Self-pay | Admitting: Internal Medicine

## 2014-02-01 DIAGNOSIS — F329 Major depressive disorder, single episode, unspecified: Secondary | ICD-10-CM | POA: Insufficient documentation

## 2014-02-01 DIAGNOSIS — F32A Depression, unspecified: Secondary | ICD-10-CM | POA: Insufficient documentation

## 2014-02-01 MED ORDER — SERTRALINE HCL 50 MG PO TABS: 50.0000 mg | ORAL_TABLET | Freq: Every day | ORAL | Status: DC

## 2014-02-01 NOTE — Telephone Encounter (Signed)
You were going to try here on some Zoloft.    Pharmacy:  [facility]  Can we please call her to let her know this has been done?  Thanks.

## 2014-02-01 NOTE — Telephone Encounter (Signed)
Please call pt. Zoloft has been sent to [facility]

## 2014-02-01 NOTE — Telephone Encounter (Signed)
Notified patient Zoloft script sent to [facility]

## 2014-02-13 ENCOUNTER — Encounter: Payer: Self-pay | Admitting: Internal Medicine
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
## 2014-04-28 ENCOUNTER — Other Ambulatory Visit: Payer: Self-pay | Admitting: Internal Medicine

## 2014-04-28 NOTE — Telephone Encounter (Signed)
Zoloft refilled

## 2014-05-24 DIAGNOSIS — K5010 Crohn's disease of large intestine without complications: Secondary | ICD-10-CM | POA: Insufficient documentation

## 2014-05-26 ENCOUNTER — Ambulatory Visit (INDEPENDENT_AMBULATORY_CARE_PROVIDER_SITE_OTHER): Payer: 59 | Admitting: Internal Medicine

## 2014-05-26 VITALS — HR 68 | Temp 97.9°F

## 2014-05-26 DIAGNOSIS — Z23 Encounter for immunization: Secondary | ICD-10-CM | POA: Diagnosis not present

## 2014-05-26 DIAGNOSIS — F32A Depression, unspecified: Secondary | ICD-10-CM

## 2014-05-26 DIAGNOSIS — F329 Major depressive disorder, single episode, unspecified: Secondary | ICD-10-CM

## 2014-05-26 DIAGNOSIS — Z0000 Encounter for general adult medical examination without abnormal findings: Secondary | ICD-10-CM

## 2014-05-26 DIAGNOSIS — K5090 Crohn's disease, unspecified, without complications: Secondary | ICD-10-CM | POA: Diagnosis not present

## 2014-05-26 MED ORDER — SERTRALINE HCL 100 MG PO TABS: 100.0000 mg | ORAL_TABLET | Freq: Every day | ORAL | Status: DC

## 2014-05-26 NOTE — Progress Notes (Signed)
Pt presents today for Hepatitis B vaccine # 3. Patient tolerated injection well.

## 2014-06-11 ENCOUNTER — Encounter: Payer: Self-pay | Admitting: Internal Medicine

## 2014-06-11 NOTE — Progress Notes (Signed)
   Subjective:    Patient ID: [name], female    DOB: [birth year], 54 y.o.   MRN: [id number]  HPI  Came in to receive hepatitis B #3. Was restarted on Zoloft recently. Would like to increase to 100 mg daily. Depression symptoms better but would like to increase dose.    Review of Systems     Objective:   Physical Exam  Not examined. Hepatitis B #3 given today.      Assessment & Plan:  Depression  Hepatitis B series completed  Plan: Hepatitis B surface antibody has been drawn at [facility] per patient. Increase Zoloft 100 mg daily and return in 4 months

## 2014-09-01 ENCOUNTER — Telehealth: Payer: Self-pay | Admitting: *Deleted

## 2014-09-01 MED ORDER — ALBUTEROL SULFATE HFA 108 (90 BASE) MCG/ACT IN AERS: 2.0000 | INHALATION_SPRAY | Freq: Four times a day (QID) | RESPIRATORY_TRACT | Status: DC | PRN

## 2014-09-01 NOTE — Telephone Encounter (Signed)
Patient requesting Albuterol Inhaler for cough and Shortness of breath. Ok to send in script per Dr Renold Genta

## 2014-09-05 ENCOUNTER — Encounter: Payer: Self-pay | Admitting: Internal Medicine

## 2014-09-05 ENCOUNTER — Telehealth: Payer: Self-pay | Admitting: *Deleted

## 2014-09-05 ENCOUNTER — Ambulatory Visit
Admission: RE | Admit: 2014-09-05 | Discharge: 2014-09-05 | Disposition: A | Discharge status: Home / Self Care | Payer: 59 | Source: Ambulatory Visit | Attending: Internal Medicine | Admitting: Internal Medicine

## 2014-09-05 ENCOUNTER — Ambulatory Visit (INDEPENDENT_AMBULATORY_CARE_PROVIDER_SITE_OTHER): Payer: 59 | Admitting: Internal Medicine

## 2014-09-05 VITALS — BP 112/76 | HR 83 | Temp 98.1°F | Wt 142.0 lb

## 2014-09-05 DIAGNOSIS — R05 Cough: Secondary | ICD-10-CM

## 2014-09-05 DIAGNOSIS — R059 Cough, unspecified: Secondary | ICD-10-CM

## 2014-09-05 DIAGNOSIS — J189 Pneumonia, unspecified organism: Secondary | ICD-10-CM

## 2014-09-05 DIAGNOSIS — J209 Acute bronchitis, unspecified: Secondary | ICD-10-CM | POA: Diagnosis not present

## 2014-09-05 DIAGNOSIS — J181 Lobar pneumonia, unspecified organism: Principal | ICD-10-CM

## 2014-09-05 MED ORDER — CLARITHROMYCIN 500 MG PO TABS: 500.0000 mg | ORAL_TABLET | Freq: Two times a day (BID) | ORAL | Status: DC

## 2014-09-05 MED ORDER — HYDROCODONE-HOMATROPINE 5-1.5 MG/5ML PO SYRP: 5.0000 mL | ORAL_SOLUTION | Freq: Three times a day (TID) | ORAL | Status: DC | PRN

## 2014-09-05 NOTE — Telephone Encounter (Signed)
Reviewed xray results with patient. Patient scheduled for repeat chest xray 09/15/14

## 2014-09-05 NOTE — Progress Notes (Signed)
   Subjective:    Patient ID: [name], female    DOB: July 15, 1960, 54 y.o.   MRN: [id number]  HPI She called last week asking for an inhaler to be prescribed. She thought she just had allergies but then developed persistent coughing. Little to no sputum production. Low-grade temperature around 99.9 orally. No shaking chills. Has been exposed to a relative who had pneumonia. Patient is on Humira and methotrexate for Crohn's disease. Has not had a respiratory infection in some time.    Review of Systems     Objective:   Physical Exam  Skin warm and dry. Nodes none. TMs are clear. Pharynx is slightly injected. Neck is supple. Chest has some coarse breath sounds in left lower lobe but no wheezing and no frank rales. She is coughing continuously in the office and cannot stop.      Assessment & Plan:  Acute bronchitis-rule out pneumonia  Plan: Chest x-ray. Biaxin 500 mg twice daily for 10 days. Hycodan 1 teaspoon by mouth every 8 hours when necessary cough.

## 2014-09-07 ENCOUNTER — Encounter: Payer: Self-pay | Admitting: Internal Medicine

## 2014-09-15 ENCOUNTER — Encounter: Payer: Self-pay | Admitting: Internal Medicine

## 2014-09-15 ENCOUNTER — Ambulatory Visit
Admission: RE | Admit: 2014-09-15 | Discharge: 2014-09-15 | Disposition: A | Discharge status: Home / Self Care | Payer: 59 | Source: Ambulatory Visit | Attending: Internal Medicine | Admitting: Internal Medicine

## 2014-09-15 ENCOUNTER — Ambulatory Visit (INDEPENDENT_AMBULATORY_CARE_PROVIDER_SITE_OTHER): Payer: 59 | Admitting: Internal Medicine

## 2014-09-15 VITALS — BP 114/62 | HR 78 | Temp 97.8°F | Wt 142.0 lb

## 2014-09-15 DIAGNOSIS — J069 Acute upper respiratory infection, unspecified: Secondary | ICD-10-CM

## 2014-09-15 DIAGNOSIS — J9801 Acute bronchospasm: Secondary | ICD-10-CM

## 2014-09-15 DIAGNOSIS — J189 Pneumonia, unspecified organism: Secondary | ICD-10-CM | POA: Diagnosis not present

## 2014-09-15 DIAGNOSIS — R5383 Other fatigue: Secondary | ICD-10-CM

## 2014-09-15 DIAGNOSIS — J181 Lobar pneumonia, unspecified organism: Principal | ICD-10-CM

## 2014-09-15 MED ORDER — MOXIFLOXACIN HCL 400 MG PO TABS: 400.0000 mg | ORAL_TABLET | Freq: Every day | ORAL | Status: DC

## 2014-09-15 MED ORDER — HYDROCODONE-HOMATROPINE 5-1.5 MG/5ML PO SYRP: 5.0000 mL | ORAL_SOLUTION | Freq: Three times a day (TID) | ORAL | Status: DC | PRN

## 2014-09-15 MED ORDER — METHYLPREDNISOLONE ACETATE 80 MG/ML IJ SUSP
80.0000 mg | Freq: Once | INTRAMUSCULAR | Status: AC
  Administered 2014-09-15: 80 mg via INTRAMUSCULAR

## 2014-09-22 ENCOUNTER — Encounter: Payer: Self-pay | Admitting: Internal Medicine

## 2014-09-22 ENCOUNTER — Ambulatory Visit (INDEPENDENT_AMBULATORY_CARE_PROVIDER_SITE_OTHER): Payer: 59 | Admitting: Internal Medicine

## 2014-09-22 VITALS — BP 102/72 | HR 78 | Temp 97.9°F | Ht 63.0 in | Wt 142.0 lb

## 2014-09-22 DIAGNOSIS — J069 Acute upper respiratory infection, unspecified: Secondary | ICD-10-CM | POA: Diagnosis not present

## 2014-09-22 DIAGNOSIS — R5383 Other fatigue: Secondary | ICD-10-CM

## 2014-09-22 DIAGNOSIS — K5190 Ulcerative colitis, unspecified, without complications: Secondary | ICD-10-CM

## 2014-09-22 DIAGNOSIS — J9801 Acute bronchospasm: Secondary | ICD-10-CM | POA: Diagnosis not present

## 2014-09-22 LAB — CBC WITH DIFFERENTIAL/PLATELET
Basophils Absolute: 0.1 10*3/uL (ref 0.0–0.1)
Basophils Relative: 1 % (ref 0–1)
EOS ABS: 0.2 10*3/uL (ref 0.0–0.7)
EOS PCT: 3 % (ref 0–5)
HEMATOCRIT: 40.1 % (ref 36.0–46.0)
Hemoglobin: 13.2 g/dL (ref 12.0–15.0)
LYMPHS PCT: 38 % (ref 12–46)
Lymphs Abs: 2.5 10*3/uL (ref 0.7–4.0)
MCH: 30.3 pg (ref 26.0–34.0)
MCHC: 32.9 g/dL (ref 30.0–36.0)
MCV: 92.2 fL (ref 78.0–100.0)
MONO ABS: 0.5 10*3/uL (ref 0.1–1.0)
MPV: 9.3 fL (ref 8.6–12.4)
Monocytes Relative: 7 % (ref 3–12)
Neutro Abs: 3.4 10*3/uL (ref 1.7–7.7)
Neutrophils Relative %: 51 % (ref 43–77)
PLATELETS: 314 10*3/uL (ref 150–400)
RBC: 4.35 MIL/uL (ref 3.87–5.11)
RDW: 13.8 % (ref 11.5–15.5)
WBC: 6.6 10*3/uL (ref 4.0–10.5)

## 2014-09-22 NOTE — Progress Notes (Addendum)
   Subjective:    Patient ID: [name], female    DOB: [birth year], 54 y.o.   MRN: [id number]  HPI  In today to follow-up on left lower lobe and lingular pneumonia diagnosed in late August. Was seen last week. Was given Depo-Medrol and Avelox. Was complaining of fatigue. Chest x-ray last week showed resolution of pneumonia. Still feeling a bit tired. History of Crohn's disease treated with Humira.   Review of Systems     Objective:   Physical Exam  Chest is clear to auscultation without rales or wheezing.      Assessment & Plan:  Fatigue  Plan: CBC with differential. Continue to rest an additional week. Call if symptoms do not improve.

## 2014-10-08 NOTE — Patient Instructions (Signed)
Rest an additional week and call if symptoms do not improve. CBC with differential drawn.

## 2014-10-08 NOTE — Progress Notes (Signed)
   Subjective:    Patient ID: [name], female    DOB: [birth year], 54 y.o.   MRN: [id number]  HPI Was seen August 22. Had URI symptoms that were protracted. Had chest x-ray and was found to have  left lower lobe and lingular pneumonia. She was started on Biaxin and Hycodan. An inhaler had been prescribed previously. She is feeling some better but is extremely fatigued. Is frustrated that she is not 100% well. Still has some cough. Repeat chest x-ray today shows resolution of pneumonia.   Review of Systems     Objective:   Physical Exam  Chest is clear to auscultation without rales or wheezing. Coarse left lower lobe breath sounds have resolved      Assessment & Plan:  Left lower lobe and lingular pneumonia-resolved  Bronchospasm  Plan: Depo-Medrol 80 mg IM. Avelox 400 mg daily for 7 days. Return in one week.

## 2014-10-08 NOTE — Patient Instructions (Addendum)
Depo-Medrol 80 mg IM given. Avelox 400 mg daily for 7 days. Return in one week.

## 2014-10-26 ENCOUNTER — Telehealth: Payer: Self-pay | Admitting: *Deleted

## 2014-10-26 DIAGNOSIS — R5383 Other fatigue: Secondary | ICD-10-CM

## 2014-10-26 DIAGNOSIS — R0683 Snoring: Secondary | ICD-10-CM

## 2014-10-26 NOTE — Telephone Encounter (Signed)
Home sleep study ordered [facility] to contact patient to set up appt.No prior authorization required for in network provider per insurance company [employer] . Patient notified that order has been placed

## 2014-12-01 ENCOUNTER — Telehealth: Payer: Self-pay | Admitting: Internal Medicine

## 2014-12-01 NOTE — Telephone Encounter (Signed)
Sleep study ordered Oct 12th. Spoke with Dr. Elsworth Soho who was not aware of this. He will check with his office. No prior authorization required.

## 2014-12-01 NOTE — Telephone Encounter (Signed)
Dr Elsworth Soho called to speak with Dr. Renold Genta r/t Home Sleep Study.  States that he would like to have Dr. Renold Genta order Home Sleep Study because he's unable to get patient in for some time.  He would like to help expedite this study for patient since he cannot get her into his office to be evaluated for some time.    Dr. Renold Genta spoke with Dr. Elsworth Soho and advised that she had ordered this study on 69/86 and no pre-cert was required for this study.  Advised that patient had been notified of this information and that all they needed to do was set the study up with the patient and proceed.    Dr Renold Genta, Jul[facility] Rainier.

## 2014-12-05 ENCOUNTER — Telehealth: Payer: Self-pay | Admitting: *Deleted

## 2014-12-05 DIAGNOSIS — G4733 Obstructive sleep apnea (adult) (pediatric): Secondary | ICD-10-CM | POA: Diagnosis not present

## 2014-12-05 NOTE — Telephone Encounter (Signed)
[facility] - when patient comes in to pick up HST, can you make sure it is set up to where Dr. Elsworth Soho can read it.   thanks

## 2014-12-05 NOTE — Telephone Encounter (Signed)
-----   Message from Rigoberto Noel, MD sent at 12/04/2014  7:30 PM EST ----- Regarding: FW: HST   ----- Message -----    From: Len Blalock, CMA    Sent: 12/02/2014   5:04 PM      To: Rigoberto Noel, MD Subject: HST                                            Hey Dr. Elsworth Soho, Just FYI: pt is picking machine for HST on Monday (12/05/14).  Thanks, Caryl Pina

## 2014-12-07 DIAGNOSIS — G4733 Obstructive sleep apnea (adult) (pediatric): Secondary | ICD-10-CM | POA: Diagnosis not present

## 2014-12-14 ENCOUNTER — Encounter: Payer: Self-pay | Admitting: Pulmonary Disease

## 2014-12-14 ENCOUNTER — Ambulatory Visit (INDEPENDENT_AMBULATORY_CARE_PROVIDER_SITE_OTHER): Payer: 59 | Admitting: Pulmonary Disease

## 2014-12-14 VITALS — BP 112/64 | HR 80 | Ht 63.0 in | Wt 143.2 lb

## 2014-12-14 DIAGNOSIS — G4733 Obstructive sleep apnea (adult) (pediatric): Secondary | ICD-10-CM

## 2014-12-14 NOTE — Progress Notes (Signed)
Subjective:     Patient ID: [name], female   DOB: [birth year], 54 y.o.   MRN: [id number]  HPI   Chief Complaint  Patient presents with  . PULMONARY CONSULT    Pt here for sleep consult. Pt had home sleep study several weeks ago. Pt reports snoring, apneas and fatigue.    54 year old woman, [name] wife, presents for evaluation of sleep-disordered breathing. Her husband has witnessed apneas and moderate snoring. She is tired when she wakes up in the morning and has occasional morning headaches. Epworth sleepiness score is 16 and she reports excessive somnolence while sitting and reading, as a passenger in a car or lying down to rest in the afternoons. Bedtime is around 11 PM, sleep latency is about 20 minutes, she sleeps on her back or her side with one pillow, reports numerous spontaneous nocturnal awakenings and is out of bed by 6:30 AM feeling tired with occasional dryness of mouth or headache. She denies nocturia. There is no history suggestive of cataplexy, sleep paralysis or parasomnias  She has Crohn's disease which is well controlled on Humira and methotrexate. She has a glass of wine daily.  Home sleep study showed total recording time of 400 minutes. There were 39 obstructive apneas and 28 hypopneas with an AHI of 12/hour, lowest desaturation was 86%. There was no relation to body position.  No past medical history on file.  No past surgical history on file.  No Known Allergies  Social History   Social History  . Marital Status: Married    Spouse Name: N/A  . Number of Children: N/A  . Years of Education: N/A   Occupational History  . Not on file.   Social History Main Topics  . Smoking status: Never Smoker   . Smokeless tobacco: Never Used  . Alcohol Use: Not on file  . Drug Use: Not on file  . Sexual Activity: Not on file   Other Topics Concern  . Not on file   Social History Narrative    Family History  Problem Relation Age of Onset  .  Alcohol abuse Mother   . Alcohol abuse Sister     Review of Systems  Constitutional: Negative.  Negative for fever and unexpected weight change.  HENT: Positive for sore throat. Negative for congestion, dental problem, ear pain, nosebleeds, postnasal drip, rhinorrhea, sinus pressure, sneezing and trouble swallowing.   Eyes: Positive for itching. Negative for redness.  Respiratory: Negative.  Negative for cough, chest tightness, shortness of breath and wheezing.   Cardiovascular: Positive for palpitations. Negative for leg swelling.  Gastrointestinal: Negative.  Negative for nausea and vomiting.  Endocrine: Negative.   Genitourinary: Negative.  Negative for dysuria.  Musculoskeletal: Positive for arthralgias. Negative for joint swelling.  Skin: Negative.  Negative for rash.  Allergic/Immunologic: Positive for environmental allergies.  Neurological: Positive for headaches.  Hematological: Negative.  Does not bruise/bleed easily.  Psychiatric/Behavioral: Negative.  Negative for dysphoric mood. The patient is not nervous/anxious.        Objective:   Physical Exam Gen. Pleasant, well-nourished, in no distress, normal affect ENT - no lesions, no post nasal drip Neck: No JVD, no thyromegaly, no carotid bruits Lungs: no use of accessory muscles, no dullness to percussion, clear without rales or rhonchi  Cardiovascular: Rhythm regular, heart sounds  normal, no murmurs or gallops, no peripheral edema Abdomen: soft and non-tender, no hepatosplenomegaly, BS normal. Musculoskeletal: No deformities, no cyanosis or clubbing Neuro:  alert, non focal  Assessment:         Plan:

## 2014-12-14 NOTE — Patient Instructions (Signed)
We will set you up with a CPAP machine Call for problems

## 2014-12-14 NOTE — Assessment & Plan Note (Addendum)
She has mild OSA- we discussed that there is not much cardio vascular risk with this degree of sleep disorder breathing. However she does appear to be symptomatic with increased somnolence and tiredness-which is impacting her daytime functioning. And so would be reasonable to undertake a trial of treatment.  We discussed the choices including oral appliance and CPAP therapy. We'll proceed with auto CPAP 5-12 cm with nasal pillows and humidity. Will obtain a download in 4 weeks for compliance and look for improvement in her somnolence and daytime energy levels  compliance with goal of at least 4-6 hrs every night is the expectation. Advised against medications with sedative side effects

## 2014-12-21 ENCOUNTER — Other Ambulatory Visit: Payer: Self-pay | Admitting: *Deleted

## 2014-12-21 DIAGNOSIS — R5383 Other fatigue: Secondary | ICD-10-CM

## 2014-12-21 DIAGNOSIS — R0683 Snoring: Secondary | ICD-10-CM

## 2015-01-12 ENCOUNTER — Other Ambulatory Visit: Payer: Self-pay | Admitting: Internal Medicine

## 2015-01-19 DIAGNOSIS — F411 Generalized anxiety disorder: Secondary | ICD-10-CM | POA: Diagnosis not present

## 2015-01-19 DIAGNOSIS — G4733 Obstructive sleep apnea (adult) (pediatric): Secondary | ICD-10-CM | POA: Diagnosis not present

## 2015-01-27 ENCOUNTER — Encounter: Payer: Self-pay | Admitting: Pulmonary Disease

## 2015-01-31 MED FILL — FOLIC ACID 1 MG TABLET: 1 | 30 days supply | Qty: 30 | Fill #5

## 2015-01-31 MED FILL — HUMIRA PEN 40 MG/0.8ML PNKT: 40 | 28 days supply | Qty: 4 | Fill #0

## 2015-02-01 DIAGNOSIS — F411 Generalized anxiety disorder: Secondary | ICD-10-CM | POA: Diagnosis not present

## 2015-02-09 MED FILL — ESTRADIOL-NORETH 1.0-0.5 MG: 1-0.5 | 28 days supply | Qty: 28 | Fill #6

## 2015-02-10 DIAGNOSIS — F411 Generalized anxiety disorder: Secondary | ICD-10-CM | POA: Diagnosis not present

## 2015-02-13 ENCOUNTER — Ambulatory Visit (INDEPENDENT_AMBULATORY_CARE_PROVIDER_SITE_OTHER): Payer: 59 | Admitting: Adult Health

## 2015-02-13 ENCOUNTER — Encounter: Payer: Self-pay | Admitting: Adult Health

## 2015-02-13 VITALS — BP 94/62 | HR 64 | Temp 98.3°F | Ht 63.0 in | Wt 149.0 lb

## 2015-02-13 DIAGNOSIS — G4733 Obstructive sleep apnea (adult) (pediatric): Secondary | ICD-10-CM | POA: Diagnosis not present

## 2015-02-13 DIAGNOSIS — Z23 Encounter for immunization: Secondary | ICD-10-CM | POA: Diagnosis not present

## 2015-02-13 NOTE — Progress Notes (Signed)
Reviewed & agree with plan Can change to fixed pressure based on download

## 2015-02-13 NOTE — Progress Notes (Signed)
Subjective:    Patient ID: [name], female    DOB: Sep 22, 1960, 55 y.o.   MRN: [id number]  HPI 55 yo female with Mild OSA  Has Crohn's disease  TEST  HST 11/2014 AHI 12/hr , SaO2 86%.   02/13/2015 Follow up : OSA Pt returns for 2 month follow up .  Pt was found to have mild OSA on home sleep study with AHI at 12/hr .  She was started on CPAP .  Says she is doing okay on CPAP . Is trying to get used to wearing mask.  Uses most nights for at least 7hrs each night.  Does feel she does not feel as sleepy but remains tired throughout day.  Download shows excellent compliance with avg usage at 7 hr . AHI 1.2 . Minimal leaks.  On autoset 5-15cmH2O.    Patient Active Problem List   Diagnosis Date Noted  . OSA (obstructive sleep apnea) 12/14/2014  . Depression 02/01/2014  . Glaucoma 01/14/2010  . Dry skin dermatitis 01/15/2008  . Crohn's disease ([place]) 01/14/2005     Current Outpatient Prescriptions on File Prior to Visit  Medication Sig Dispense Refill  . adalimumab (HUMIRA) 40 MG/0.8ML injection Inject 0.8 mLs (40 mg total) into the skin once a week.    . estradiol-norethindrone (ACTIVELLA) 1-0.5 MG per tablet Take 1 tablet by mouth daily.    . fluocinonide ointment (LIDEX) [id number] % Apply 1 application topically daily as needed.    . folic acid (FOLVITE) 1 MG tablet Take 1 tablet (1 mg total) by mouth daily.    . JUBLIA 10 % SOLN APP TO NAILS HS FOR 48 WEEKS UTD  10  . methotrexate (RHEUMATREX) 2.5 MG tablet TAKE 4 TABLETS BY MOUTH ONCE A WEEK    . sertraline (ZOLOFT) 100 MG tablet TAKE 1 TABLET BY MOUTH DAILY. 90 tablet 3   No current facility-administered medications on file prior to visit.     Review of Systems Constitutional:   No  weight loss, night sweats,  Fevers, chills, fatigue, or  lassitude.  HEENT:   No headaches,  Difficulty swallowing,  Tooth/dental problems, or  Sore throat,                No sneezing, itching, ear ache, nasal congestion, post nasal  drip,   CV:  No chest pain,  Orthopnea, PND, swelling in lower extremities, anasarca, dizziness, palpitations, syncope.    Resp: No shortness of breath with exertion or at rest.  No excess mucus, no productive cough,  No non-productive cough,  No coughing up of blood.  No change in color of mucus.  No wheezing.  No chest wall deformity  Skin: no rash or lesions.  GU: no dysuria, change in color of urine, no urgency or frequency.  No flank pain, no hematuria   MS:  No joint pain or swelling.  No decreased range of motion.  No back pain.  Psych:  No change in mood or affect. No depression or anxiety.  No memory loss.         Objective:   Physical Exam   Filed Vitals:   02/13/15 1431  BP: 94/62  Pulse: 64  Temp: 98.3 F (36.8 C)  TempSrc: Oral  Height: 5\' 3"  (1.6 m)  Weight: 149 lb (67.586 kg)  SpO2: 98%   Body mass index is 26.4 kg/(m^2).   GEN: A/Ox3; pleasant , NAD  HEENT:  [place]/AT,  EACs-clear, TMs-wnl, NOSE-clear, THROAT-clear, no lesions, no  postnasal drip or exudate noted.   NECK:  Supple w/ fair ROM; no JVD; normal carotid impulses w/o bruits; no thyromegaly or nodules palpated; no lymphadenopathy.  RESP  Clear  P & A; w/o, wheezes/ rales/ or rhonchi.no accessory muscle use, no dullness to percussion  CARD:  RRR, no m/r/g  , no peripheral edema, pulses intact, no cyanosis or clubbing.  GI:   Soft & nt; nml bowel sounds; no organomegaly or masses detected.  Musco: Warm bil, no deformities or joint swelling noted.   Neuro: alert, no focal deficits noted.    Skin: Warm, no lesions or rashes         Assessment & Plan:

## 2015-02-13 NOTE — Patient Instructions (Signed)
Continue on CPAP At bedtime  .  Keep up good work .  Flu shot today .  follow up Dr. Elsworth Soho  In 4 months and As needed

## 2015-02-13 NOTE — Assessment & Plan Note (Signed)
Doing well on CPAP   Plan  Continue on CPAP At bedtime  .  Keep up good work .  Flu shot today .  follow up Dr. Elsworth Soho  In 4 months and As needed

## 2015-02-19 DIAGNOSIS — G4733 Obstructive sleep apnea (adult) (pediatric): Secondary | ICD-10-CM | POA: Diagnosis not present

## 2015-02-21 ENCOUNTER — Institutional Professional Consult (permissible substitution): Payer: 59 | Admitting: Pulmonary Disease

## 2015-02-24 DIAGNOSIS — F411 Generalized anxiety disorder: Secondary | ICD-10-CM | POA: Diagnosis not present

## 2015-02-27 MED FILL — HUMIRA 40 MG/0.8ML PSKT: 40 | 28 days supply | Qty: 4 | Fill #0

## 2015-02-27 MED FILL — FOLIC ACID 1 MG TABLET: 1 | 30 days supply | Qty: 30 | Fill #6

## 2015-02-27 MED FILL — METHOTREXATE 2.5 MG TABLET: 2.5 | 28 days supply | Qty: 16 | Fill #1

## 2015-02-28 MED FILL — ESTRADIOL-NORETH 1.0-0.5 MG: 1-0.5 | 28 days supply | Qty: 28 | Fill #7

## 2015-03-03 ENCOUNTER — Encounter: Payer: Self-pay | Admitting: Adult Health

## 2015-03-14 DIAGNOSIS — F411 Generalized anxiety disorder: Secondary | ICD-10-CM | POA: Diagnosis not present

## 2015-03-19 DIAGNOSIS — G4733 Obstructive sleep apnea (adult) (pediatric): Secondary | ICD-10-CM | POA: Diagnosis not present

## 2015-03-22 DIAGNOSIS — F411 Generalized anxiety disorder: Secondary | ICD-10-CM | POA: Diagnosis not present

## 2015-03-24 DIAGNOSIS — G4733 Obstructive sleep apnea (adult) (pediatric): Secondary | ICD-10-CM | POA: Diagnosis not present

## 2015-04-06 MED FILL — ESTRADIOL-NORETH 1.0-0.5 MG: 1-0.5 | 28 days supply | Qty: 28 | Fill #8

## 2015-04-06 MED FILL — SERTRALINE HCL 100 MG TAB: 100 | 90 days supply | Qty: 90 | Fill #1

## 2015-04-06 MED FILL — FOLIC ACID 1 MG TABLET: 1 | 30 days supply | Qty: 30 | Fill #7

## 2015-04-06 MED FILL — HUMIRA 40 MG/0.8ML PSKT: 40 | 28 days supply | Qty: 4 | Fill #1

## 2015-04-10 MED FILL — METHOTREXATE 2.5 MG TABLET: 2.5 | 28 days supply | Qty: 16 | Fill #0

## 2015-04-18 ENCOUNTER — Other Ambulatory Visit: Payer: Self-pay | Admitting: Internal Medicine

## 2015-04-18 ENCOUNTER — Encounter: Payer: Self-pay | Admitting: Internal Medicine

## 2015-04-18 ENCOUNTER — Ambulatory Visit (INDEPENDENT_AMBULATORY_CARE_PROVIDER_SITE_OTHER): Payer: 59 | Admitting: Internal Medicine

## 2015-04-18 ENCOUNTER — Other Ambulatory Visit: Payer: Self-pay

## 2015-04-18 VITALS — BP 104/60 | HR 74 | Temp 97.7°F | Resp 18 | Ht 63.0 in | Wt 149.5 lb

## 2015-04-18 DIAGNOSIS — M7918 Myalgia, other site: Secondary | ICD-10-CM

## 2015-04-18 DIAGNOSIS — M791 Myalgia: Secondary | ICD-10-CM | POA: Diagnosis not present

## 2015-04-18 DIAGNOSIS — M2550 Pain in unspecified joint: Secondary | ICD-10-CM

## 2015-04-18 DIAGNOSIS — R768 Other specified abnormal immunological findings in serum: Secondary | ICD-10-CM | POA: Diagnosis not present

## 2015-04-18 LAB — RHEUMATOID FACTOR

## 2015-04-18 NOTE — Progress Notes (Addendum)
   Subjective:    Patient ID: [name], female    DOB: 14-Oct-1960, 55 y.o.   MRN: [id number]  HPI 55 year old Female with history of Crohn's disease of the colon treated with Humira and methotrexate in today complaining of diffuse joint pain. 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 She has knee pain and recently after a drive to [facility] had significant bilateral hip pain. She has back pain and shoulder pain. Has noticed some soreness and stiffness in her hands. Crohn's disease is treated by gastroenterologist at [facility]. Currently taking methotrexate 10 mg weekly.Patient is been advised by gastroenterologist not to take nonsteroidal anti-inflammatory medication however, the pain has been so severe she's been taking some. Offered her pain medication today such as hydrocodone/APAP or tramadol but she declined. She may want to try extra strength Tylenol sparingly.    Review of Systems     Objective:   Physical Exam She has crepitus in both knees. No knee joint effusions. No elbow effusions. No increased warmth or redness of joints. Good range of motion in shoulders.       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 Refer to rheumatologist for evaluation.

## 2015-04-18 NOTE — Patient Instructions (Signed)
Laboratory studies pending for arthritis workup. Patient declined pain medication. Refer to rheumatologist.

## 2015-04-19 DIAGNOSIS — G4733 Obstructive sleep apnea (adult) (pediatric): Secondary | ICD-10-CM | POA: Diagnosis not present

## 2015-04-19 LAB — ANTI-NUCLEAR AB-TITER (ANA TITER): ANA Titer 1: 1:640 {titer} — ABNORMAL HIGH

## 2015-04-19 LAB — SEDIMENTATION RATE: Sed Rate: 1 mm/hr (ref 0–30)

## 2015-04-19 LAB — CK TOTAL AND CKMB (NOT AT ARMC): CK TOTAL: 247 U/L — AB (ref 7–177)

## 2015-04-19 LAB — CYCLIC CITRUL PEPTIDE ANTIBODY, IGG

## 2015-04-19 LAB — ANA: Anti Nuclear Antibody(ANA): POSITIVE — AB

## 2015-04-20 DIAGNOSIS — F411 Generalized anxiety disorder: Secondary | ICD-10-CM | POA: Diagnosis not present

## 2015-04-21 LAB — ANTI-SMITH ANTIBODY: ENA SM AB SER-ACNC: NEGATIVE

## 2015-04-21 LAB — ANTI-DNA ANTIBODY, DOUBLE-STRANDED: ds DNA Ab: 2 IU/mL

## 2015-05-04 DIAGNOSIS — F411 Generalized anxiety disorder: Secondary | ICD-10-CM | POA: Diagnosis not present

## 2015-05-05 ENCOUNTER — Ambulatory Visit (INDEPENDENT_AMBULATORY_CARE_PROVIDER_SITE_OTHER): Payer: 59 | Admitting: Internal Medicine

## 2015-05-05 ENCOUNTER — Encounter: Payer: Self-pay | Admitting: Internal Medicine

## 2015-05-05 VITALS — BP 106/60 | HR 67 | Temp 98.0°F | Resp 18 | Wt 148.0 lb

## 2015-05-05 DIAGNOSIS — F909 Attention-deficit hyperactivity disorder, unspecified type: Secondary | ICD-10-CM

## 2015-05-05 DIAGNOSIS — Z1159 Encounter for screening for other viral diseases: Secondary | ICD-10-CM | POA: Diagnosis not present

## 2015-05-05 DIAGNOSIS — F988 Other specified behavioral and emotional disorders with onset usually occurring in childhood and adolescence: Secondary | ICD-10-CM

## 2015-05-05 DIAGNOSIS — R5383 Other fatigue: Secondary | ICD-10-CM | POA: Diagnosis not present

## 2015-05-05 LAB — TSH: TSH: 2.95 m[IU]/L

## 2015-05-05 LAB — T4, FREE: FREE T4: 1.1 ng/dL (ref 0.8–1.8)

## 2015-05-05 MED ORDER — AMPHETAMINE-DEXTROAMPHET ER 10 MG PO CP24: 10.0000 mg | ORAL_CAPSULE | Freq: Every day | ORAL | Status: DC

## 2015-05-05 MED FILL — ESTRADIOL-NORETH 1.0-0.5 MG: 1-0.5 | 28 days supply | Qty: 28 | Fill #9

## 2015-05-05 MED FILL — METHOTREXATE 2.5 MG TABLET: 2.5 | 28 days supply | Qty: 16 | Fill #1

## 2015-05-05 MED FILL — HUMIRA 40 MG/0.8ML PSKT: 40 | 28 days supply | Qty: 4 | Fill #2

## 2015-05-05 MED FILL — DEXTROAMP-AMPHET ER 10 MG C: 10 | 30 days supply | Qty: 30 | Fill #0

## 2015-05-05 MED FILL — FOLIC ACID 1 MG TABLET: 1 | 30 days supply | Qty: 30 | Fill #8

## 2015-05-05 NOTE — Patient Instructions (Addendum)
Starrt Adderall XR 10 mg daily and follow up in 3 weeks. Hepatitis C screening done today at patient request

## 2015-05-06 LAB — HEPATITIS C ANTIBODY: HCV AB: NEGATIVE

## 2015-05-11 DIAGNOSIS — F411 Generalized anxiety disorder: Secondary | ICD-10-CM | POA: Diagnosis not present

## 2015-05-13 NOTE — Progress Notes (Signed)
   Subjective:    Patient ID: [name], female    DOB: 12-14-1960, 54 y.o.   MRN: [id number]  HPI Patient says her counselor thinks she has an element of attention deficit disorder. She has noticed she is having trouble organizing things and keeping up with things. She does have a lot going on with family situation. She has taken in her nephew. Her sister is living in a homeless shelter in [place]. Patient has 2 sons of her own-- one of whom is living at home. Patient has had issues with back pain and hip pain and is seeing a rheumatologist in the near future for possible ankylosing spondylitis. Patient is on an SSRI for depression.    Review of Systems see above     Objective:   Physical Exam  Spent 20 minutes speaking with patient about these issues. It seems reasonable to give Adderall  a try at a low-dose and have her follow-up in mid-May.      Assessment & Plan:  Probable attention deficit disorder  Situational stress  History of depression  Crohn's disease  Musculoskeletal pain  Screen for hepatitis C at patient request  Plan: Adderall XR 10 mg daily. Reevaluate mid-May. Has upcoming Rheumatology appointment. Continue to see counselor. Continue Zoloft.

## 2015-05-16 DIAGNOSIS — M2550 Pain in unspecified joint: Secondary | ICD-10-CM | POA: Diagnosis not present

## 2015-05-16 DIAGNOSIS — M25561 Pain in right knee: Secondary | ICD-10-CM | POA: Diagnosis not present

## 2015-05-16 DIAGNOSIS — M533 Sacrococcygeal disorders, not elsewhere classified: Secondary | ICD-10-CM | POA: Diagnosis not present

## 2015-05-19 MED FILL — CELECOXIB 100 MG CAPSULE: 100 | 30 days supply | Qty: 60 | Fill #0

## 2015-05-26 ENCOUNTER — Ambulatory Visit: Payer: 59 | Admitting: Internal Medicine

## 2015-05-29 ENCOUNTER — Ambulatory Visit: Payer: Self-pay | Admitting: Internal Medicine

## 2015-05-30 ENCOUNTER — Encounter: Payer: Self-pay | Admitting: Internal Medicine

## 2015-05-30 ENCOUNTER — Ambulatory Visit (INDEPENDENT_AMBULATORY_CARE_PROVIDER_SITE_OTHER): Payer: 59 | Admitting: Internal Medicine

## 2015-05-30 VITALS — BP 106/60 | HR 93 | Temp 97.3°F | Resp 18 | Wt 141.5 lb

## 2015-05-30 DIAGNOSIS — F988 Other specified behavioral and emotional disorders with onset usually occurring in childhood and adolescence: Secondary | ICD-10-CM

## 2015-05-30 DIAGNOSIS — Z658 Other specified problems related to psychosocial circumstances: Secondary | ICD-10-CM | POA: Diagnosis not present

## 2015-05-30 DIAGNOSIS — F329 Major depressive disorder, single episode, unspecified: Secondary | ICD-10-CM

## 2015-05-30 DIAGNOSIS — F32A Depression, unspecified: Secondary | ICD-10-CM

## 2015-05-30 DIAGNOSIS — F909 Attention-deficit hyperactivity disorder, unspecified type: Secondary | ICD-10-CM | POA: Diagnosis not present

## 2015-05-30 DIAGNOSIS — F439 Reaction to severe stress, unspecified: Secondary | ICD-10-CM

## 2015-05-30 MED ORDER — ESCITALOPRAM OXALATE 10 MG PO TABS: 10.0000 mg | ORAL_TABLET | Freq: Every day | ORAL | Status: DC

## 2015-05-30 MED FILL — ESCITALOPRAM 10 MG TABLET: 10 | 90 days supply | Qty: 90 | Fill #0

## 2015-05-31 DIAGNOSIS — F411 Generalized anxiety disorder: Secondary | ICD-10-CM | POA: Diagnosis not present

## 2015-05-31 MED FILL — ESTRADIOL-NORETH 1.0-0.5 MG: 1-0.5 | 28 days supply | Qty: 28 | Fill #10

## 2015-06-06 ENCOUNTER — Other Ambulatory Visit: Payer: Self-pay | Admitting: Internal Medicine

## 2015-06-06 MED ORDER — AMPHETAMINE-DEXTROAMPHET ER 10 MG PO CP24: 10.0000 mg | ORAL_CAPSULE | Freq: Every day | ORAL | Status: DC

## 2015-06-07 NOTE — Progress Notes (Signed)
   Subjective:    Patient ID: [name], female    DOB: 20-Nov-1960, 55 y.o.   MRN: [id number]  HPI At last visit on April 21, we checked thyroid functions. These were normal. She requested screening for Hepatitis C which was negative. She is on Zoloft 100 mg daily. Feels that 10 mg of Adderall XR is about right for her but she still having some difficulty getting things organized. She is worried about her nephew who lives with the family at the present time. His parents apparently live in [place]. His mother is living in a shelter and has history of alcoholism. Patient continues to see counselor. Has noticed she is short tempered at times. Feels like she is overwhelmed many days. She saw a rheumatologist recently. Apparently does not have ankylosing spondylitis. She is on Humira and methotrexate for Crohn's disease.She has Celebrex for musculoskeletal pain.    Review of Systems see above     Objective:   Physical Exam  Not examined. Spent 20 minutes speaking with her about these issues.      Assessment & Plan:  Situational stress  Attention deficit-doing okay low-dose Adderall XR  Depression-continue Zoloft 100 mg daily  Crohn's disease  Musculoskeletal pain  Plan: Follow-up in late May

## 2015-06-07 NOTE — Patient Instructions (Addendum)
Return in 2 weeks for follow-up. Continue same medications.

## 2015-06-13 ENCOUNTER — Encounter: Payer: Self-pay | Admitting: Internal Medicine

## 2015-06-13 ENCOUNTER — Ambulatory Visit (INDEPENDENT_AMBULATORY_CARE_PROVIDER_SITE_OTHER): Payer: 59 | Admitting: Internal Medicine

## 2015-06-13 VITALS — BP 110/64 | HR 68 | Temp 98.0°F | Resp 18 | Wt 144.5 lb

## 2015-06-13 DIAGNOSIS — F439 Reaction to severe stress, unspecified: Secondary | ICD-10-CM

## 2015-06-13 DIAGNOSIS — Z658 Other specified problems related to psychosocial circumstances: Secondary | ICD-10-CM | POA: Diagnosis not present

## 2015-06-13 DIAGNOSIS — F411 Generalized anxiety disorder: Secondary | ICD-10-CM | POA: Diagnosis not present

## 2015-06-13 MED ORDER — ALPRAZOLAM 0.25 MG PO TABS: 0.2500 mg | ORAL_TABLET | Freq: Two times a day (BID) | ORAL | Status: DC | PRN

## 2015-06-13 NOTE — Patient Instructions (Signed)
Takes Xanax sparingly for anxiety. Continue Lexapro. Return as needed. Continue counseling.

## 2015-06-13 NOTE — Progress Notes (Signed)
   Subjective:    Patient ID: [name], female    DOB: July 03, 1960, 55 y.o.   MRN: [id number]  HPI She returns today for follow-up. She thinks Adderall 10 mg was making her irritable. She doesn't want to take it anymore. Still feels overwhelmed at times. Might benefit more from a small dose of antianxiety medication. She is on Lexapro. No longer on Zoloft. Continues with counseling. Her nephew who is 13 years old continues to be a challenge for her. He did not turn in a math assignment that was do today. He says he forgot his work and did not take it with him when the family when out of town for [employer]. He has been tested but his test results will be available for 2 weeks. Talked with her about possibility of [facility] with [facility] at [facility]. She thinks he might need a Company secretary. He demonstrates some oppositional defiant behavior from time to time. She feels like he is passive-aggressive at times like her sister was. This is a struggle for her. She took him in out of the goodness of her heart as her sister has alcohol abuse issues and is in a shelter in [place]. She is getting Medicaid benefits for him in the near future. She thinks she will pass the school year.    Review of Systems     Objective:   Physical Exam Spent 20 minutes speaking with her about these issues.       Assessment & Plan:  Situational stress  Plan: Continue Lexapro 10 mg daily. Xanax 0.25 mg twice daily as needed for anxiety. Take sparingly. Discontinue Adderall. Follow-up when necessary. Continue counseling.

## 2015-06-14 ENCOUNTER — Ambulatory Visit (HOSPITAL_BASED_OUTPATIENT_CLINIC_OR_DEPARTMENT_OTHER): Payer: 59 | Admitting: Pharmacist

## 2015-06-14 DIAGNOSIS — K50919 Crohn's disease, unspecified, with unspecified complications: Secondary | ICD-10-CM

## 2015-06-14 MED ORDER — ADALIMUMAB 40 MG/0.8ML ~~LOC~~ PSKT: 40.0000 mg | PREFILLED_SYRINGE | SUBCUTANEOUS | Status: DC

## 2015-06-14 MED FILL — ALPRAZolam 0.25 MG TABS: 0.25 | 30 days supply | Qty: 60 | Fill #0

## 2015-06-14 MED FILL — HUMIRA 40 MG/0.8ML PSKT: 40 | 28 days supply | Qty: 4 | Fill #0

## 2015-06-14 NOTE — Progress Notes (Signed)
S: Patient presents today to the [facility].  Patient is currently taking Humira for Crohn's Disease. Patient is managed by Kristie Cowman at [facility] for this.   Adherence: denies any missed doses of Humira or methotrexate but does endorse taking her Activella (estradiol-norethindrone) a few hours late which will lead to spotting. She is interested in discussing ways to help her remember to take it on time. She has tried using an alarm and really likes it, but will sometimes silence the alarm and then get distracted by something and forget to take the medication.   Dosing:  40 mg every week.   Drug-drug interactions: celecoxib and methotrexate - patient uses celecoxib sparingly.  Screening: TB test: completed prior to initiation  Hepatitis: completed prior to initiation  Monitoring: S/sx of infection: denies CBC: last one WNL S/sx of hypersensitivity: denies S/sx of malignancy: S/sx of heart failure: denies  Drug-specific monitoring: Adalimumab levels 5.6 Anti-Adalimumab Ab levels: <25   O:     Lab Results  Component Value Date   WBC 6.6 09/22/2014   HGB 13.2 09/22/2014   HCT 40.1 09/22/2014   MCV 92.2 09/22/2014   PLT 314 09/22/2014      Chemistry      Component Value Date/Time   NA 138 04/05/2010 0855   K 4.2 04/05/2010 0855   CL 103 04/05/2010 0855   CO2 23 04/05/2010 0855   BUN 15 04/05/2010 0855   CREATININE 0.74 04/05/2010 0855      Component Value Date/Time   CALCIUM 9.3 04/05/2010 0855   ALKPHOS 33* 04/05/2010 0855   ALKPHOS 36* 04/05/2010 0855   AST 19 04/05/2010 0855   ALT 14 04/05/2010 0855   BILITOT 0.5 04/05/2010 0855       A/P: 1. 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 All of her labs have been normal and she is regularly following with gastroenterologist at [facility] - she is having a colonoscopy and endoscopy there next month. Encouraged  patient to avoid use of NSAIDs due to drug-drug interactions. No recommendations for changes to any medications.   Counseled patient on adherence techniques, such as alarms, notes, pill boxes, and placement of medication in a often visited area. Patient will try to use a second alarm to remind her to take her medication on time.   Nicoletta Ba, PharmD, BCPS, [place] [facility] [phone]  Evaluation and management procedures were performed by the Certified Pharmacy Practitioner under my supervision and collaboration. I have reviewed the CPP's note and chart, and I agree with the management and plan.  Angelica Chessman, MD, [place], [place], [place], [place] and [place], [place]   06/14/2015, 12:00 PM

## 2015-06-15 MED FILL — FOLIC ACID 1 MG TABLET: 1 | 30 days supply | Qty: 30 | Fill #9

## 2015-06-19 ENCOUNTER — Encounter: Payer: Self-pay | Admitting: Pulmonary Disease

## 2015-06-19 ENCOUNTER — Ambulatory Visit (INDEPENDENT_AMBULATORY_CARE_PROVIDER_SITE_OTHER): Payer: 59 | Admitting: Pulmonary Disease

## 2015-06-19 VITALS — BP 122/60 | HR 63 | Ht 63.0 in | Wt 147.8 lb

## 2015-06-19 DIAGNOSIS — G4733 Obstructive sleep apnea (adult) (pediatric): Secondary | ICD-10-CM

## 2015-06-19 NOTE — Progress Notes (Signed)
   Subjective:    Patient ID: [name], female    DOB: [birth year], 55 y.o.   MRN: [id number]  HPI 55 yo female with Mild OSA  Has Crohn's disease  Chief Complaint  Patient presents with  . Follow-up    pt states she wears CPAP avg 7 hr nightly. feels mask & pressure are ok. no supplies needed. [place]   She was started on auto CPAP with good results She feels less tired rested in the daytime Download on auto settings show average pressure of 8 cm with no residuals and no leak Good compliance with average of about 7 hours  No issues with pressure or dryness    Significant tests/ events  HST 11/2014 AHI 12/hr , SaO2 86%.      Review of Systems neg for any significant sore throat, dysphagia, itching, sneezing, nasal congestion or excess/ purulent secretions, fever, chills, sweats, unintended wt loss, pleuritic or exertional cp, hempoptysis, orthopnea pnd or change in chronic leg swelling. Also denies presyncope, palpitations, heartburn, abdominal pain, nausea, vomiting, diarrhea or change in bowel or urinary habits, dysuria,hematuria, rash, arthralgias, visual complaints, headache, numbness weakness or ataxia.     Objective:   Physical Exam  Gen. Pleasant, well-nourished, in no distress ENT - no lesions, no post nasal drip Neck: No JVD, no thyromegaly, no carotid bruits Lungs: no use of accessory muscles, no dullness to percussion, clear without rales or rhonchi  Cardiovascular: Rhythm regular, heart sounds  normal, no murmurs or gallops, no peripheral edema Musculoskeletal: No deformities, no cyanosis or clubbing         Assessment & Plan:

## 2015-06-19 NOTE — Assessment & Plan Note (Signed)
Good results with CPAP usage including improvement in daytime fatigue and sleepiness.  Weight loss encouraged, compliance with goal of at least 4-6 hrs every night is the expectation. Advised against medications with sedative side effects Cautioned against driving when sleepy - understanding that sleepiness will vary on a day to day basis

## 2015-06-19 NOTE — Patient Instructions (Signed)
You are on auto CPAP settings - avg pr is 8 cm

## 2015-06-23 MED FILL — TERCONAZOLE 0.8% VAGINAL CR: 0.8 | 3 days supply | Qty: 20 | Fill #0

## 2015-06-28 DIAGNOSIS — M79641 Pain in right hand: Secondary | ICD-10-CM | POA: Diagnosis not present

## 2015-06-28 DIAGNOSIS — S6991XA Unspecified injury of right wrist, hand and finger(s), initial encounter: Secondary | ICD-10-CM | POA: Diagnosis not present

## 2015-06-28 DIAGNOSIS — M79644 Pain in right finger(s): Secondary | ICD-10-CM | POA: Diagnosis not present

## 2015-06-29 ENCOUNTER — Encounter: Payer: Self-pay | Admitting: Pulmonary Disease

## 2015-06-29 DIAGNOSIS — F411 Generalized anxiety disorder: Secondary | ICD-10-CM | POA: Diagnosis not present

## 2015-06-29 MED FILL — ESTRADIOL-NORETH 1.0-0.5 MG: 1-0.5 | 28 days supply | Qty: 28 | Fill #11

## 2015-07-11 MED FILL — GAVILYTE-N SOLUTION: 420 | 1 days supply | Qty: 4000 | Fill #0

## 2015-07-12 DIAGNOSIS — K5730 Diverticulosis of large intestine without perforation or abscess without bleeding: Secondary | ICD-10-CM | POA: Diagnosis not present

## 2015-07-12 DIAGNOSIS — K50118 Crohn's disease of large intestine with other complication: Secondary | ICD-10-CM | POA: Diagnosis not present

## 2015-07-12 DIAGNOSIS — K220 Achalasia of cardia: Secondary | ICD-10-CM | POA: Diagnosis not present

## 2015-07-12 DIAGNOSIS — Z1211 Encounter for screening for malignant neoplasm of colon: Secondary | ICD-10-CM | POA: Diagnosis not present

## 2015-07-12 DIAGNOSIS — K5010 Crohn's disease of large intestine without complications: Secondary | ICD-10-CM | POA: Diagnosis not present

## 2015-07-12 DIAGNOSIS — L309 Dermatitis, unspecified: Secondary | ICD-10-CM | POA: Diagnosis not present

## 2015-07-12 DIAGNOSIS — Z79899 Other long term (current) drug therapy: Secondary | ICD-10-CM | POA: Diagnosis not present

## 2015-07-12 DIAGNOSIS — K224 Dyskinesia of esophagus: Secondary | ICD-10-CM | POA: Diagnosis not present

## 2015-07-12 DIAGNOSIS — K5140 Inflammatory polyps of colon without complications: Secondary | ICD-10-CM | POA: Diagnosis not present

## 2015-07-12 DIAGNOSIS — G4730 Sleep apnea, unspecified: Secondary | ICD-10-CM | POA: Diagnosis not present

## 2015-07-12 DIAGNOSIS — M1990 Unspecified osteoarthritis, unspecified site: Secondary | ICD-10-CM | POA: Diagnosis not present

## 2015-07-12 DIAGNOSIS — Z8719 Personal history of other diseases of the digestive system: Secondary | ICD-10-CM | POA: Diagnosis not present

## 2015-07-14 MED FILL — FOLIC ACID 1 MG TABLET: 1 | 30 days supply | Qty: 30 | Fill #10

## 2015-07-14 MED FILL — METHOTREXATE 2.5 MG TABLET: 2.5 | 28 days supply | Qty: 16 | Fill #0

## 2015-07-19 ENCOUNTER — Telehealth: Payer: Self-pay | Admitting: Internal Medicine

## 2015-07-19 DIAGNOSIS — S6991XD Unspecified injury of right wrist, hand and finger(s), subsequent encounter: Secondary | ICD-10-CM | POA: Diagnosis not present

## 2015-07-19 NOTE — Telephone Encounter (Signed)
Patient had Upper GI and Colon at [facility] on 07/12/15.  They found that she had Hernia and she needed repair.  Faxed info over to [facility].  Patient has appointment with Dr. Fanny Skates on 7/20 @ 9:45.  Patient has been notified.

## 2015-07-24 DIAGNOSIS — F411 Generalized anxiety disorder: Secondary | ICD-10-CM | POA: Diagnosis not present

## 2015-07-31 MED FILL — ESTRADIOL-NORETH 1.0-0.5 MG: 1-0.5 | 28 days supply | Qty: 28 | Fill #0

## 2015-08-01 DIAGNOSIS — F411 Generalized anxiety disorder: Secondary | ICD-10-CM | POA: Diagnosis not present

## 2015-08-03 DIAGNOSIS — F329 Major depressive disorder, single episode, unspecified: Secondary | ICD-10-CM | POA: Diagnosis not present

## 2015-08-03 DIAGNOSIS — F988 Other specified behavioral and emotional disorders with onset usually occurring in childhood and adolescence: Secondary | ICD-10-CM | POA: Diagnosis not present

## 2015-08-03 DIAGNOSIS — K439 Ventral hernia without obstruction or gangrene: Secondary | ICD-10-CM | POA: Diagnosis not present

## 2015-08-03 DIAGNOSIS — G4733 Obstructive sleep apnea (adult) (pediatric): Secondary | ICD-10-CM | POA: Diagnosis not present

## 2015-08-03 DIAGNOSIS — K5090 Crohn's disease, unspecified, without complications: Secondary | ICD-10-CM | POA: Diagnosis not present

## 2015-08-14 DIAGNOSIS — S6991XD Unspecified injury of right wrist, hand and finger(s), subsequent encounter: Secondary | ICD-10-CM | POA: Diagnosis not present

## 2015-08-15 ENCOUNTER — Telehealth: Payer: Self-pay | Admitting: Pulmonary Disease

## 2015-08-15 DIAGNOSIS — Z1151 Encounter for screening for human papillomavirus (HPV): Secondary | ICD-10-CM | POA: Diagnosis not present

## 2015-08-15 DIAGNOSIS — Z01419 Encounter for gynecological examination (general) (routine) without abnormal findings: Secondary | ICD-10-CM | POA: Diagnosis not present

## 2015-08-15 DIAGNOSIS — Z1231 Encounter for screening mammogram for malignant neoplasm of breast: Secondary | ICD-10-CM | POA: Diagnosis not present

## 2015-08-15 DIAGNOSIS — Z6826 Body mass index (BMI) 26.0-26.9, adult: Secondary | ICD-10-CM | POA: Diagnosis not present

## 2015-08-15 NOTE — Telephone Encounter (Signed)
I tried calling her left her message  For nasal congestion associated with CPAP- heated humidifier helps If she is already using humidifier can turn up settings  Also nasal steroid such as Nasonex 1 spray each nare can help

## 2015-08-15 NOTE — Telephone Encounter (Signed)
Spoke with pt and she c/o increasing nasal congestion since starting CPAP use. Pt reports that she has sinus drainage and cough. She has been using Ventolin when she coughs a lot. Pt denies wheeze/CP/tightness. Pt has not taken any other medications for symptoms.   RA please advise. Thanks!

## 2015-08-16 ENCOUNTER — Other Ambulatory Visit: Payer: Self-pay | Admitting: Orthopedic Surgery

## 2015-08-16 DIAGNOSIS — M79644 Pain in right finger(s): Secondary | ICD-10-CM

## 2015-08-16 DIAGNOSIS — F411 Generalized anxiety disorder: Secondary | ICD-10-CM | POA: Diagnosis not present

## 2015-08-16 NOTE — Telephone Encounter (Signed)
Called and spoke with pt and she is aware of RA recs.  Nothing further is needed.  

## 2015-08-17 MED FILL — METHOTREXATE 2.5 MG TABLET: 2.5 | 28 days supply | Qty: 16 | Fill #1

## 2015-08-21 MED FILL — FOLIC ACID 1 MG TABLET: 1 | 90 days supply | Qty: 90 | Fill #0

## 2015-08-24 MED FILL — ESTRADIOL-NORETH 1.0-0.5 MG: 1-0.5 | 84 days supply | Qty: 84 | Fill #0

## 2015-08-28 ENCOUNTER — Other Ambulatory Visit: Payer: Self-pay | Admitting: Internal Medicine

## 2015-08-28 MED FILL — ESCITALOPRAM 10 MG TABLET: 10 | 90 days supply | Qty: 90 | Fill #0

## 2015-08-29 DIAGNOSIS — Z13220 Encounter for screening for lipoid disorders: Secondary | ICD-10-CM | POA: Diagnosis not present

## 2015-08-29 DIAGNOSIS — Z1329 Encounter for screening for other suspected endocrine disorder: Secondary | ICD-10-CM | POA: Diagnosis not present

## 2015-08-29 DIAGNOSIS — Z0000 Encounter for general adult medical examination without abnormal findings: Secondary | ICD-10-CM | POA: Diagnosis not present

## 2015-08-29 DIAGNOSIS — F411 Generalized anxiety disorder: Secondary | ICD-10-CM | POA: Diagnosis not present

## 2015-08-29 DIAGNOSIS — Z130 Encounter for screening for diseases of the blood and blood-forming organs and certain disorders involving the immune mechanism: Secondary | ICD-10-CM | POA: Diagnosis not present

## 2015-08-31 DIAGNOSIS — D1801 Hemangioma of skin and subcutaneous tissue: Secondary | ICD-10-CM | POA: Diagnosis not present

## 2015-08-31 DIAGNOSIS — B351 Tinea unguium: Secondary | ICD-10-CM | POA: Diagnosis not present

## 2015-08-31 DIAGNOSIS — L309 Dermatitis, unspecified: Secondary | ICD-10-CM | POA: Diagnosis not present

## 2015-08-31 DIAGNOSIS — L738 Other specified follicular disorders: Secondary | ICD-10-CM | POA: Diagnosis not present

## 2015-08-31 MED FILL — FLUCONAZOLE 200 MG TABLET: 200 | 84 days supply | Qty: 12 | Fill #0

## 2015-08-31 MED FILL — FLUOCINONIDE 0.05% CREAM: 0.05 | 30 days supply | Qty: 60 | Fill #0

## 2015-08-31 MED FILL — HYDROCORTISONE 2.5% CREAM: 2.5 | 14 days supply | Qty: 30 | Fill #0

## 2015-09-01 ENCOUNTER — Ambulatory Visit
Admission: RE | Admit: 2015-09-01 | Discharge: 2015-09-01 | Disposition: A | Discharge status: Home / Self Care | Payer: 59 | Source: Ambulatory Visit | Attending: Orthopedic Surgery | Admitting: Orthopedic Surgery

## 2015-09-01 DIAGNOSIS — M79644 Pain in right finger(s): Secondary | ICD-10-CM

## 2015-09-01 MED ORDER — IOPAMIDOL (ISOVUE-M 200) INJECTION 41%: 3.0000 mL | Freq: Once | INTRAMUSCULAR | Status: DC

## 2015-09-04 DIAGNOSIS — S6991XD Unspecified injury of right wrist, hand and finger(s), subsequent encounter: Secondary | ICD-10-CM | POA: Diagnosis not present

## 2015-09-06 DIAGNOSIS — M79644 Pain in right finger(s): Secondary | ICD-10-CM | POA: Diagnosis not present

## 2015-09-11 DIAGNOSIS — M79644 Pain in right finger(s): Secondary | ICD-10-CM | POA: Diagnosis not present

## 2015-09-13 DIAGNOSIS — F411 Generalized anxiety disorder: Secondary | ICD-10-CM | POA: Diagnosis not present

## 2015-09-20 DIAGNOSIS — M79644 Pain in right finger(s): Secondary | ICD-10-CM | POA: Diagnosis not present

## 2015-09-20 MED FILL — METHOTREXATE 2.5 MG TABLET: 2.5 | 28 days supply | Qty: 16 | Fill #0

## 2015-09-22 ENCOUNTER — Other Ambulatory Visit: Payer: Self-pay | Admitting: Pharmacist

## 2015-09-22 DIAGNOSIS — F411 Generalized anxiety disorder: Secondary | ICD-10-CM | POA: Diagnosis not present

## 2015-09-22 DIAGNOSIS — L0101 Non-bullous impetigo: Secondary | ICD-10-CM | POA: Diagnosis not present

## 2015-09-22 DIAGNOSIS — L738 Other specified follicular disorders: Secondary | ICD-10-CM | POA: Diagnosis not present

## 2015-09-22 DIAGNOSIS — L0109 Other impetigo: Secondary | ICD-10-CM | POA: Diagnosis not present

## 2015-09-22 MED ORDER — ADALIMUMAB 40 MG/0.8ML ~~LOC~~ PSKT
40.0000 mg | PREFILLED_SYRINGE | SUBCUTANEOUS | 6 refills | Status: DC
Start: 2015-09-22 — End: 2016-06-05

## 2015-09-22 MED FILL — MUPIROCIN 2% OINTMENT: 2 | 10 days supply | Qty: 22 | Fill #0

## 2015-09-22 MED FILL — HUMIRA 40 MG/0.8ML PSKT: 40 | 28 days supply | Qty: 4 | Fill #0

## 2015-09-22 MED FILL — CEPHALEXIN 500 MG CAPSULE: 500 | 13 days supply | Qty: 32 | Fill #0

## 2015-09-23 ENCOUNTER — Encounter (HOSPITAL_COMMUNITY): Payer: Self-pay

## 2015-09-23 ENCOUNTER — Emergency Department (HOSPITAL_COMMUNITY)
Admission: EM | Admit: 2015-09-23 | Discharge: 2015-09-23 | Disposition: A | Discharge status: Home / Self Care | Payer: 59 | Attending: Emergency Medicine | Admitting: Emergency Medicine

## 2015-09-23 DIAGNOSIS — L0889 Other specified local infections of the skin and subcutaneous tissue: Secondary | ICD-10-CM | POA: Diagnosis not present

## 2015-09-23 DIAGNOSIS — IMO0001 Reserved for inherently not codable concepts without codable children: Secondary | ICD-10-CM

## 2015-09-23 DIAGNOSIS — L03012 Cellulitis of left finger: Secondary | ICD-10-CM | POA: Diagnosis not present

## 2015-09-23 DIAGNOSIS — L02512 Cutaneous abscess of left hand: Secondary | ICD-10-CM | POA: Diagnosis not present

## 2015-09-23 HISTORY — DX: Crohn's disease, unspecified, without complications: K50.90

## 2015-09-23 MED ORDER — HYDROCODONE-ACETAMINOPHEN 5-325 MG PO TABS: 1.0000 | ORAL_TABLET | Freq: Four times a day (QID) | ORAL | 0 refills | Status: DC | PRN

## 2015-09-23 MED ORDER — SULFAMETHOXAZOLE-TRIMETHOPRIM 800-160 MG PO TABS: 1.0000 | ORAL_TABLET | Freq: Two times a day (BID) | ORAL | 0 refills | Status: AC

## 2015-09-23 MED ORDER — LIDOCAINE HCL 2 % IJ SOLN
20.0000 mL | Freq: Once | INTRAMUSCULAR | Status: DC
  Filled 2015-09-23: qty 20

## 2015-09-23 NOTE — ED Provider Notes (Signed)
MSE was initiated and I personally evaluated the patient and placed orders (if any) at  3:17 PM on September 23, 2015.  The patient appears stable so that the remainder of the MSE may be completed by another provider.  Patient seen upon arrival by myself. Patient is here to see Dr. Amedeo Plenty in, the hand surgeon. Well-appearing. Stable to be seen by Dr. Amedeo Plenty.   Davonna Belling, MD 09/23/15 [phone]

## 2015-09-23 NOTE — ED Triage Notes (Signed)
Pt sent here to see Dr. Amedeo Plenty for a possible staph infection to left ring finger. Finger is red and swollen.

## 2015-09-23 NOTE — ED Notes (Signed)
Declined W/C at D/C and was escorted to lobby by RN. 

## 2015-09-23 NOTE — Consult Note (Signed)
Reason for Consult: Infection left ring finger dorsal soft tissue Referring Physician: Brennah Quraishi is an 55 y.o. female.  HPI:  55 year old female with a pro the patient and her husband all issues.  She denies neck back or chest pain.  gressive infection about the ring finger left hand. She's been on Keflex. She seen a dermatologist. She expressed some infectious fluid out of the finger today. She notes no prior history of injury to the hand on the left side that she is aware of. She denies insect bite or inoculation.  I reviewed her past medical and surgical history at length. I discussed all issues with the patient and her husband at length  The infection has been worsening with progressive swelling.  Past Medical History:  Diagnosis Date  . Crohn disease [facility])     Past Surgical History:  Procedure Laterality Date  . CESAREAN SECTION      Family History  Problem Relation Age of Onset  . Alcohol abuse Mother   . Alcohol abuse Sister     Social History:  reports that she has never smoked. She has never used smokeless tobacco. She reports that she drinks alcohol. Her drug history is not on file.  Allergies: No Known Allergies  Medications: I have reviewed the patient's current medications.  No results found for this or any previous visit (from the past 48 hour(s)).  No results found.  Review of Systems  Eyes: Negative.   Respiratory: Negative.   Cardiovascular: Negative.   Genitourinary: Negative.    Blood pressure 131/75, pulse 74, temperature 98.2 F (36.8 C), temperature source Oral, resp. rate 18, SpO2 100 %. Physical Exam patient presents with known infection worsening in nature about left ring finger. She has a raised area with a sinus tract present. There is no evidence of necrotizing fasciitis dystrophy or compartment syndrome. Tendon architecture is intact. The patient is alert and oriented in no acute distress. The patient complains of pain  in the affected upper extremity.  The patient is noted to have a normal HEENT exam. Lung fields show equal chest expansion and no shortness of breath. Abdomen exam is nontender without distention. Lower extremity examination does not show any fracture dislocation or blood clot symptoms. Pelvis is stable and the neck and back are stable and nontender.  Assessment/Plan: Infection left ring finger dorsal aspect overlying the P1 region  I recommended irrigation and debridement. She consents.We are planning surgery for your upper extremity. The risk and benefits of surgery to include risk of bleeding, infection, anesthesia,  damage to normal structures and failure of the surgery to accomplish its intended goals of relieving symptoms and restoring function have been discussed in detail. With this in mind we plan to proceed. I have specifically discussed with the patient the pre-and postoperative regime and the dos and don'ts and risk and benefits in great detail. Risk and benefits of surgery also include risk of dystrophy(CRPS), chronic nerve pain, failure of the healing process to go onto completion and other inherent risks of surgery The relavent the pathophysiology of the disease/injury process, as well as the alternatives for treatment and postoperative course of action has been discussed in great detail with the patient who desires to proceed.  We will do everything in our power to help you (the patient) restore function to the upper extremity. It is a pleasure to see this patient today.   Procedure she underwent a Betadine scrub followed by alcohol preparation. Sterile field secured  in timeout was called. Following this patient underwent incision and drainage of an infectious area about the dorsal aspect of her left ring finger. This was a I and D of a deep abscess. I performed a limited extensor 10 lysis tenosynovitis me and expressed obvious pyruvic material. This was cultured for aerobic and  anaerobic culture.  I discussed with the patient these issues. She was lavage with multiple rounds of saline. This was a debridement of skin subcutaneous tenderness tissue and tendon.  Following the irrigation process we packed her with iodoform gauze without difficulty.  I would recommend she see me tomorrow for dressing change and I'll remove the packing lavage the wound and then transition her to therapy.  I recommended Bactrim DS 1 by mouth twice a day in addition to Keflex 14 days I recommended Norco when necessary pain.  She'll notify me same problem screw they have my cell phone number.   Paulene Floor 09/23/2015, 3:52 PM

## 2015-09-25 DIAGNOSIS — M79645 Pain in left finger(s): Secondary | ICD-10-CM | POA: Diagnosis not present

## 2015-09-25 LAB — AEROBIC CULTURE W GRAM STAIN (SUPERFICIAL SPECIMEN)

## 2015-09-27 ENCOUNTER — Telehealth (HOSPITAL_COMMUNITY): Payer: Self-pay

## 2015-09-27 NOTE — Telephone Encounter (Signed)
Post ED Visit - Positive Culture Follow-up  Culture report reviewed by antimicrobial stewardship pharmacist:  []  Elenor Quinones, Pharm.D. []  Heide Guile, Pharm.D., BCPS []  Parks Neptune, Pharm.D. []  Alycia Rossetti, Pharm.D., BCPS []  [place], Pharm.D., BCPS, AAHIVP []  Legrand Como, Pharm.D., BCPS, AAHIVP []  Milus Glazier, Pharm.D. []  Stephens November, [place].D. Shanon Rosser, Pharm.D.  Positive  Culture, Left Finger -> few Staph. Aureus Treated with Sulfa-TriMeth, organism sensitive to the same and no further patient follow-up is required at this time.  Dortha Kern 09/27/2015, 1:54 PM

## 2015-09-28 ENCOUNTER — Other Ambulatory Visit: Payer: Self-pay | Admitting: Internal Medicine

## 2015-09-28 DIAGNOSIS — M79645 Pain in left finger(s): Secondary | ICD-10-CM | POA: Diagnosis not present

## 2015-09-28 MED FILL — VENTOLIN HFA 90 MCG INHALER: 108 (90 BAS | 30 days supply | Qty: 18 | Fill #0

## 2015-10-02 DIAGNOSIS — G4733 Obstructive sleep apnea (adult) (pediatric): Secondary | ICD-10-CM | POA: Diagnosis not present

## 2015-10-02 DIAGNOSIS — H40013 Open angle with borderline findings, low risk, bilateral: Secondary | ICD-10-CM | POA: Diagnosis not present

## 2015-10-03 ENCOUNTER — Ambulatory Visit (INDEPENDENT_AMBULATORY_CARE_PROVIDER_SITE_OTHER): Payer: 59 | Admitting: Pulmonary Disease

## 2015-10-03 ENCOUNTER — Encounter: Payer: Self-pay | Admitting: Pulmonary Disease

## 2015-10-03 DIAGNOSIS — G4733 Obstructive sleep apnea (adult) (pediatric): Secondary | ICD-10-CM

## 2015-10-03 DIAGNOSIS — J329 Chronic sinusitis, unspecified: Secondary | ICD-10-CM | POA: Diagnosis not present

## 2015-10-03 DIAGNOSIS — J40 Bronchitis, not specified as acute or chronic: Secondary | ICD-10-CM

## 2015-10-03 MED ORDER — GUAIFENESIN-CODEINE 100-10 MG/5ML PO SYRP: 5.0000 mL | ORAL_SOLUTION | Freq: Three times a day (TID) | ORAL | 0 refills | Status: DC | PRN

## 2015-10-03 MED FILL — CHERATUSSIN AC SYRUP: 100-10 | 7 days supply | Qty: 120 | Fill #0

## 2015-10-03 NOTE — Assessment & Plan Note (Signed)
Complete antibiotic Prescription cough syrup-Cheratussin at bedtime-can take occasional dose in daytime if this does not make you too drowsy -no driving afterwards

## 2015-10-03 NOTE — Assessment & Plan Note (Signed)
Okay to stay off CPAP for 4 weeks until cough is better   Trial of SOclean -cleaning agent for CPAP  If recurrent LRI, may have to consider alternatives such as dental appliance

## 2015-10-03 NOTE — Progress Notes (Signed)
   Subjective:    Patient ID: [name], female    DOB: 10-Jan-1961, 55 y.o.   MRN: [id number]  HPI  55 yo female with Mild OSA  Has Crohn's disease  10/03/2015  Chief Complaint  Patient presents with  . Acute Visit    Pt. states she has had a dry cough x 1 week, some sob with her coughing spells, says she has been congestioned off and on since being on her CPAP machine,     She was started on auto CPAP with good results In terms of daytime tiredness and somnolence. However she complains of frequent cough and congestion and wonders if this is related to CPAP I looked at her nose and nose are pillows and humidifier and they appear clean  She had a staph infection and required I&D of left hand. She is now on Bactrim. She reports sinus congestion and cough for about 2 weeks-she has tried over-the-counter cough syrup Delsym with mild relief  Frequent coughing paroxysms in the office  Last Download on auto settings show average pressure of 8 cm with no residuals and no leak Good compliance with average of about 7 hours  No issues with pressure or dryness    Significant tests/ events  HST 11/2014 AHI 12/hr , SaO2 86%.    Review of Systems neg for any significant sore throat, dysphagia, itching, sneezing, nasal congestion or excess/ purulent secretions, fever, chills, sweats, unintended wt loss, pleuritic or exertional cp, hempoptysis, orthopnea pnd or change in chronic leg swelling. Also denies presyncope, palpitations, heartburn, abdominal pain, nausea, vomiting, diarrhea or change in bowel or urinary habits, dysuria,hematuria, rash, arthralgias, visual complaints, headache, numbness weakness or ataxia.     Objective:   Physical Exam  Gen. Pleasant, well-nourished, in no distress ENT - no lesions, no post nasal drip Neck: No JVD, no thyromegaly, no carotid bruits Lungs: no use of accessory muscles, no dullness to percussion, clear without rales or rhonchi    Cardiovascular: Rhythm regular, heart sounds  normal, no murmurs or gallops, no peripheral edema Musculoskeletal: No deformities, no cyanosis or clubbing        Assessment & Plan:

## 2015-10-03 NOTE — Patient Instructions (Signed)
Okay to stay off CPAP for 4 weeks until cough is better  Complete antibiotic Prescription cough syrup-Cheratussin at bedtime-can take occasional dose in daytime if this does not make you too drowsy -no driving afterwards   Trial of SOclean -cleaning agent for CPAP

## 2015-10-06 ENCOUNTER — Telehealth: Payer: Self-pay | Admitting: Pulmonary Disease

## 2015-10-06 DIAGNOSIS — F411 Generalized anxiety disorder: Secondary | ICD-10-CM | POA: Diagnosis not present

## 2015-10-06 MED ORDER — HYDROCOD POLST-CPM POLST ER 10-8 MG/5ML PO SUER: 5.0000 mL | Freq: Two times a day (BID) | ORAL | 0 refills | Status: DC

## 2015-10-06 MED FILL — HYDROCODONE-CHLORPHENIRAM S: 10-8 | 12 days supply | Qty: 120 | Fill #0

## 2015-10-06 NOTE — Telephone Encounter (Signed)
Spoke to pt & told her Rx is up front for her.

## 2015-10-06 NOTE — Telephone Encounter (Signed)
LMTCB x1 for pt tussionex Rx signed and placed at the front for pick up.

## 2015-10-06 NOTE — Telephone Encounter (Signed)
Tussionex 31m PO bid #120 ml No driving etc precautions with codeine

## 2015-10-06 NOTE — Telephone Encounter (Signed)
Per 10/03/15 OV: Instructions     Return in about 3 months (around 01/02/2016).   Okay to stay off CPAP for 4 weeks until cough is better   Complete antibiotic Prescription cough syrup-Cheratussin at bedtime-can take occasional dose in daytime if this does not make you too drowsy -no driving afterwards    Trial of SOclean -cleaning agent for CPAP  ---  Called spoke with pt. She reports the cough syrup is not working for her at all. Reports she is coughing so much. She is requesting tussionex. Also reports she is coughing all day long and can't get any relief. Please advise Dr. Elsworth Soho thanks

## 2015-10-12 DIAGNOSIS — F411 Generalized anxiety disorder: Secondary | ICD-10-CM | POA: Diagnosis not present

## 2015-10-13 MED FILL — METHOTREXATE 2.5 MG TABLET: 2.5 | 28 days supply | Qty: 16 | Fill #1

## 2015-10-16 DIAGNOSIS — S6991XD Unspecified injury of right wrist, hand and finger(s), subsequent encounter: Secondary | ICD-10-CM | POA: Diagnosis not present

## 2015-10-17 ENCOUNTER — Other Ambulatory Visit: Payer: Self-pay | Admitting: Orthopedic Surgery

## 2015-10-20 ENCOUNTER — Telehealth: Payer: Self-pay | Admitting: Pulmonary Disease

## 2015-10-20 DIAGNOSIS — F411 Generalized anxiety disorder: Secondary | ICD-10-CM | POA: Diagnosis not present

## 2015-10-20 NOTE — Telephone Encounter (Signed)
Spoke with the pt and notified of recs per MW  She verbalized understanding  Nothing further needed 

## 2015-10-20 NOTE — Telephone Encounter (Signed)
Per 9.19.17 ov w/ RA: Patient Instructions   Okay to stay off CPAP for 4 weeks until cough is better   Complete antibiotic Prescription cough syrup-Cheratussin at bedtime-can take occasional dose in daytime if this does not make you too drowsy -no driving afterwards    Trial of SOclean -cleaning agent for CPAP    Called spoke with patient who reported she is still having the dry cough, chest congestion and SOB.  Denies any wheezing, tightness or purulent sputum.  Cough is "some better" but still struggling at night and have "a couple of coughing jags" during the day.  Pt is requesting alternative therapy.  RA please advise, thank you!  NKDA [facility]

## 2015-10-20 NOTE — Telephone Encounter (Signed)
For w/e try Try prilosec otc 54m  Take 30-60 min before first meal of the day and Pepcid ac (famotidine) 20 mg one @  bedtime until cough is completely gone for at least a week without the need for cough suppression  If not better next week ov

## 2015-10-24 ENCOUNTER — Encounter (HOSPITAL_BASED_OUTPATIENT_CLINIC_OR_DEPARTMENT_OTHER): Payer: Self-pay | Admitting: *Deleted

## 2015-10-27 ENCOUNTER — Other Ambulatory Visit: Payer: Self-pay | Admitting: Internal Medicine

## 2015-10-27 ENCOUNTER — Ambulatory Visit (INDEPENDENT_AMBULATORY_CARE_PROVIDER_SITE_OTHER): Payer: 59 | Admitting: Internal Medicine

## 2015-10-27 ENCOUNTER — Encounter: Payer: Self-pay | Admitting: Internal Medicine

## 2015-10-27 VITALS — BP 102/70 | HR 70 | Wt 150.0 lb

## 2015-10-27 DIAGNOSIS — L0203 Carbuncle of face: Secondary | ICD-10-CM

## 2015-10-27 DIAGNOSIS — F411 Generalized anxiety disorder: Secondary | ICD-10-CM | POA: Diagnosis not present

## 2015-10-27 DIAGNOSIS — L0202 Furuncle of face: Secondary | ICD-10-CM | POA: Diagnosis not present

## 2015-10-27 DIAGNOSIS — K5090 Crohn's disease, unspecified, without complications: Secondary | ICD-10-CM | POA: Diagnosis not present

## 2015-10-27 MED ORDER — SULFAMETHOXAZOLE-TRIMETHOPRIM 800-160 MG PO TABS: 1.0000 | ORAL_TABLET | Freq: Two times a day (BID) | ORAL | 0 refills | Status: DC

## 2015-10-27 MED FILL — SULFAMETHOXAZOLE/TMP DS TAB: 800-160 | 10 days supply | Qty: 20 | Fill #0

## 2015-10-27 NOTE — Patient Instructions (Signed)
Bactrim DS 1 by mouth twice a day for 10 days. Patient is to call Dr. Fredna Dow and inquire about upcoming surgery with possible diagnosis of Staph on face. Continue Bactroban in nostrils at bedtime. Recommend bathing and Hibiclens from neck down and using antibacterial facial wash.

## 2015-10-27 NOTE — Progress Notes (Signed)
   Subjective:    Patient ID: [name], female    DOB: 1960/11/22, 55 y.o.   MRN: [id number]  HPI  Patient developed Staph aureus abscess left fourth finger requiring surgical drainage by Dr. Phillip Heal make him urgently over the Labor Day holiday. She was placed on Bactrim. Finger has improved. She is also scheduled to have right index radial collateral ligament repair by Dr. Fredna Dow October 17.  Recently has developed a couple of pustules on her right chin area. She squeezed one and said some purulent drainage came. We attempted to culture this today.  She's been using Bactroban in her nostrils.  Says she's had some eczema on the backs of her legs and developed some lesions of the air prior to the abscess on her left index finger and also had similar lesions in her scalp. She has seen dermatologist for these lesions.  She has a history of Crohn's disease. She is on methotrexate. Will not take methotrexate while she is taking Bactrim.  No fever or chills.  Has been using an antiseptic facial wash.    Review of Systems as above     Objective:   Physical Exam 2 small pustules right chin the lower one is a bit larger approximately 2 mm in size. Culture pending.  Spent 25 minutes with her discussing this issue collecting specimen and making recommendations     Assessment & Plan:  History of Staph aureus infection left index finger  Pustules right chin-likely Staph infection  She may well be colonized with staph aureus. Is to continue Bactroban to nostrils daily. Recommend bathing and Hibiclens from neck down leave on for 5 minutes and rinse well. She should contact Dr. Fredna Dow to let him know she's been placed on Bactrim for possible recurrent staph infection for 10 days. She has upcoming schedule surgery with him on October 17. Do not take methotrexate while on Bactrim. Do not squeeze pimples on face.

## 2015-10-30 LAB — CULTURE, ROUTINE-ABSCESS
GRAM STAIN: NONE SEEN
Gram Stain: NONE SEEN
Gram Stain: NONE SEEN

## 2015-10-31 ENCOUNTER — Ambulatory Visit (HOSPITAL_BASED_OUTPATIENT_CLINIC_OR_DEPARTMENT_OTHER): Admission: RE | Admit: 2015-10-31 | Payer: 59 | Source: Ambulatory Visit | Admitting: Orthopedic Surgery

## 2015-10-31 SURGERY — REPAIR, LIGAMENT
Anesthesia: Choice | Laterality: Right

## 2015-11-03 ENCOUNTER — Ambulatory Visit
Admission: RE | Admit: 2015-11-03 | Discharge: 2015-11-03 | Disposition: A | Discharge status: Home / Self Care | Payer: 59 | Source: Ambulatory Visit | Attending: Internal Medicine | Admitting: Internal Medicine

## 2015-11-03 ENCOUNTER — Ambulatory Visit (INDEPENDENT_AMBULATORY_CARE_PROVIDER_SITE_OTHER): Payer: 59 | Admitting: Internal Medicine

## 2015-11-03 ENCOUNTER — Encounter: Payer: Self-pay | Admitting: Internal Medicine

## 2015-11-03 ENCOUNTER — Telehealth: Payer: Self-pay

## 2015-11-03 VITALS — BP 110/70 | HR 68 | Wt 146.5 lb

## 2015-11-03 DIAGNOSIS — R05 Cough: Secondary | ICD-10-CM

## 2015-11-03 DIAGNOSIS — K5090 Crohn's disease, unspecified, without complications: Secondary | ICD-10-CM | POA: Diagnosis not present

## 2015-11-03 DIAGNOSIS — A4902 Methicillin resistant Staphylococcus aureus infection, unspecified site: Secondary | ICD-10-CM | POA: Diagnosis not present

## 2015-11-03 DIAGNOSIS — J209 Acute bronchitis, unspecified: Secondary | ICD-10-CM

## 2015-11-03 DIAGNOSIS — R059 Cough, unspecified: Secondary | ICD-10-CM

## 2015-11-03 MED ORDER — LEVOFLOXACIN 500 MG PO TABS: 500.0000 mg | ORAL_TABLET | Freq: Every day | ORAL | 0 refills | Status: DC

## 2015-11-03 MED FILL — levoFLOXacin 500 MG TABS: 500 | 10 days supply | Qty: 10 | Fill #0

## 2015-11-03 NOTE — Patient Instructions (Signed)
Levaquin 500 milligrams daily for 10 days. Chest x-ray. Allergy testing schedule next week.

## 2015-11-03 NOTE — Telephone Encounter (Signed)
CXR is negative

## 2015-11-03 NOTE — Progress Notes (Signed)
   Subjective:    Patient ID: [name], female    DOB: January 02, 1961, 55 y.o.   MRN: [id number]  HPI  55 year old Female seen October 13 with lesions on her face. These were cultured and grew MRSA. She was treated with doxycycline. She has had a cough now for some 6 weeks. No fever. She has stopped taking methotrexate. She has a history of Crohn's disease. GI doctor advise stop taking methotrexate until these issues have resolved. She has appointment for allergy testing on Wednesday, October 25.  History of left lower lobe pneumonia August 2016.    Review of Systems as above     Objective:   Physical Exam Skin warm and dry. Nodes none. TMs and pharynx are clear. Neck is supple. She has a deep congested cough. Chest clear to auscultation.       Assessment & Plan:  Bronchitis  History of Crohn's disease-methotrexate has been discontinued  MRSA infection of chin-status post treatment with doxycycline  Plan: Patient cannot have surgery on hand until respiratory infection and MRSA infection have resolved.  Was scheduled to have repair of radial collateral ligament injury right index finger with Dr. Fredna Dow October 17 but this surgery was canceled due to MRSA infection.  Levaquin 500 milligrams daily for 10 days. Considered short course of prednisone but she is to have allergy testing this coming Wednesday so decided not to do this. She does have cough medication. Chest x-ray ordered. History of left lower lobe pneumonia 2016

## 2015-11-03 NOTE — Telephone Encounter (Signed)
Pt was here on 10/27/15, her cough is no better "she's coughing up" "colored stuff" she would like to know if you can send something to her pharmacy.

## 2015-11-03 NOTE — Telephone Encounter (Signed)
Appointment given for today at 4:15.

## 2015-11-03 NOTE — Telephone Encounter (Signed)
Needs OV.  

## 2015-11-06 ENCOUNTER — Ambulatory Visit: Payer: Self-pay | Admitting: Internal Medicine

## 2015-11-08 ENCOUNTER — Encounter: Payer: Self-pay | Admitting: Allergy

## 2015-11-08 ENCOUNTER — Ambulatory Visit (INDEPENDENT_AMBULATORY_CARE_PROVIDER_SITE_OTHER): Payer: 59 | Admitting: Allergy

## 2015-11-08 VITALS — BP 100/75 | HR 75 | Temp 98.5°F | Resp 16 | Ht 62.21 in | Wt 147.0 lb

## 2015-11-08 DIAGNOSIS — H1010 Acute atopic conjunctivitis, unspecified eye: Secondary | ICD-10-CM | POA: Diagnosis not present

## 2015-11-08 DIAGNOSIS — K219 Gastro-esophageal reflux disease without esophagitis: Secondary | ICD-10-CM | POA: Diagnosis not present

## 2015-11-08 DIAGNOSIS — J45991 Cough variant asthma: Secondary | ICD-10-CM

## 2015-11-08 DIAGNOSIS — J309 Allergic rhinitis, unspecified: Secondary | ICD-10-CM | POA: Diagnosis not present

## 2015-11-08 MED ORDER — BECLOMETHASONE DIPROPIONATE 80 MCG/ACT IN AERS: 2.0000 | INHALATION_SPRAY | Freq: Two times a day (BID) | RESPIRATORY_TRACT | 5 refills | Status: DC

## 2015-11-08 MED ORDER — AZELASTINE HCL 0.1 % NA SOLN: 2.0000 | Freq: Two times a day (BID) | NASAL | 5 refills | Status: DC

## 2015-11-08 MED FILL — AZELASTINE HCL 137 MCG SPRY: 0.1 | 30 days supply | Qty: 30 | Fill #0

## 2015-11-08 MED FILL — QVAR 80 MCG ORAL INHALER: 80 | 30 days supply | Qty: 9 | Fill #0

## 2015-11-08 NOTE — Progress Notes (Signed)
New Patient Note  RE: [name] MRN: [id number] DOB: 02/19/1960 Date of Office Visit: 11/08/2015  Referring provider: Elby Showers, MD Primary care provider: Elby Showers, MD  Chief Complaint: cough  History of present illness: [name] is a 55 y.o. female presenting today for consultation for cough.  She has a history of Crohn's disease on Humira.   She has been having a cough since the beginning of September.  She feels like the cough is due to throat irritation and she also feels like "stuff is coming".   She reports the phlegm she coughs up was originally clear which had changed in color over time to green-yellow.  She feels someone what improved after her levaquin course that she recently completed.  She has coughing spasms as well.   Cough is worse in the morning and at night.  She denies any wheezing.  She does feel like she has tickle in her throat and has had some voice changes.  She was provided with albuterol with onset of this cough and she does feel it is helpful.  She was using albuterol several times a day in the beginning but now just using daily.     She has nasal congestion and drainage and does feel PND.  Also with itchy watery eyes.  Allergy symptoms worse in spring and fall.  She will take zyrtec but she recently switched to [place].   She uses nasacort daily 2 sprays each nostril.   She has had "bronchitis" before but it has never lasted as long as this.      She recently had a MRSA infection of her chin treated with bactrim at the beginning of the month.   She has seen pulmonary (Dr Elsworth Soho) for OSA.   She has a CPAP but stopped using as she feels it made her more congested.  He started her on pepcid and omeprazole for possible reflux as contributor to cough.  She denies any heartburn symptoms and is not sure if it has made much difference.    Review of systems: Review of Systems  Constitutional: Positive for malaise/fatigue. Negative for chills and  fever.  HENT: Positive for congestion. Negative for sore throat.   Eyes: Negative for redness.  Respiratory: Positive for cough. Negative for wheezing.   Cardiovascular: Negative for chest pain.  Gastrointestinal: Negative for heartburn, nausea and vomiting.  Skin: Negative for itching and rash.  Neurological: Negative for headaches.    All other systems negative unless noted above in HPI  Past medical history: Past Medical History:  Diagnosis Date  . Crohn disease ([place])     Past surgical history: Past Surgical History:  Procedure Laterality Date  . CESAREAN SECTION      Family history:  Family History  Problem Relation Age of Onset  . Alcohol abuse Mother   . Alcohol abuse Sister   . Angioedema Neg Hx   . Allergic rhinitis Neg Hx   . Asthma Neg Hx   . Atopy Neg Hx   . Eczema Neg Hx   . Immunodeficiency Neg Hx   . Urticaria Neg Hx     Social history: She lives in a home with carpeting in the bedroom with gas heating and central cooling. There are 2 dogs in the home. There are no concerns for water damage or mildew or cockroaches in the home.  She does not work. She is a lifetime nonsmoker.   Medication List:   Medication List  Accurate as of 11/08/15 11:15 AM. Always use your most recent med list.          Adalimumab 40 MG/0.8ML Pskt Commonly known as:  HUMIRA Inject 0.8 mLs (40 mg total) into the skin once a week.   ALPRAZolam 0.25 MG tablet Commonly known as:  XANAX Take 1 tablet (0.25 mg total) by mouth 2 (two) times daily as needed for anxiety.   azelastine 0.1 % nasal spray Commonly known as:  ASTELIN Place 2 sprays into both nostrils 2 (two) times daily.   beclomethasone 80 MCG/ACT inhaler Commonly known as:  QVAR Inhale 2 puffs into the lungs 2 (two) times daily.   chlorpheniramine-HYDROcodone 10-8 MG/5ML Suer Commonly known as:  TUSSIONEX PENNKINETIC ER Take 5 mLs by mouth 2 (two) times daily.   escitalopram 10 MG tablet Commonly  known as:  LEXAPRO TAKE 1 TABLET (10 MG TOTAL) BY MOUTH DAILY.   estradiol-norethindrone 1-0.5 MG tablet Commonly known as:  ACTIVELLA Take 1 tablet by mouth daily.   fluocinonide ointment 0.05 % Commonly known as:  LIDEX Apply 1 application topically daily as needed.   folic acid 1 MG tablet Commonly known as:  FOLVITE Take 1 tablet (1 mg total) by mouth daily.   methotrexate 2.5 MG tablet Commonly known as:  RHEUMATREX TAKE 4 TABLETS BY MOUTH ONCE A WEEK   PENNSAID 2 % Soln Generic drug:  Diclofenac Sodium APPLY 2 PUMPS (2 GRAMS) TO AFFECTED AREA TOPICALLY TWICE DAILY AS DIRECTED   VENTOLIN HFA 108 (90 Base) MCG/ACT inhaler Generic drug:  albuterol INHALE 2 PUFFS INTO THE LUNGS EVERY 6 HOURS AS NEEDED FOR WHEEZING OR SHORTNESS OF BREATH.       Known medication allergies: Allergies  Allergen Reactions  . Hydrocodone Itching     Physical examination: Blood pressure 100/75, pulse 75, temperature 98.5 F (36.9 C), temperature source Oral, resp. rate 16, height 5' 2.21" (1.58 m), weight 147 lb (66.7 kg).  General: Alert, interactive, in no acute distress. HEENT: TMs pearly gray, turbinates moderately edematous with clear discharge, post-pharynx non erythematous. Neck: Supple without lymphadenopathy. Lungs: Clear to auscultation without wheezing, rhonchi or rales. {no increased work of breathing. CV: Normal S1, S2 without murmurs. Abdomen: Nondistended, nontender. Skin: Warm and dry, without lesions or rashes. Extremities:  No clubbing, cyanosis or edema. Neuro:   Grossly intact.  Diagnositics/Labs  Spirometry: FEV1: 2.6L   111%, FVC: 3.06L  107%, ratio consistent with Nonobstructive pattern  Imaging: 11/03/15:  Personally reviewed with no focal opacities   Allergy testing:  Skin prick testing positive for oak and pecan tree and dust mites.  IDs were negative to weeds, grasses, molds, cat, dog, cockroach Allergy testing results were read and interpreted by  provider, documented by clinical staff.   Assessment and plan:   Allergic rhinoconjunctivitis   - Allergy testing today was positive for several trees (oak, pecan) and dust mites   - Allergen avoidance measures discussed.   - Continue allegra 180mg  daily   - Continue Nasacort 2 sprays each nostril daily   - Start Astelin nasal spray 2 sprays twice a day   - Demonstrated proper nasal spray technique  Cough   - likely Multifactorial in nature. Given her atopy and improved with albuterol . Her cough may be cough variant asthma. Spirometry today was normal.  Will trial on an inhaled corticosteroid to see if she has further improvement which would be diagnostic of coughing variant asthma.  Copies also likely worsened by postnasal drip from  her allergic rhinitis as well as possible LPR.  - Continue albuterol 2 puffs every 4-6 hours as needed for cough, wheeze, shortness of breath  - Trial on Qvar 12mcg 2 puffs twice a day  - Consider adding Singulair if she does not have complete resolution with inhaled corticosteroid  - She should receive a flu vaccine this season  LPR   - Continue Pepcid 20mg  twice a day  Follow-up 2 months  I appreciate the opportunity to take part in Cacie's care. Please do not hesitate to contact me with questions.  Sincerely,   Prudy Feeler, MD Allergy/Immunology [facility]

## 2015-11-08 NOTE — Patient Instructions (Addendum)
Allergy testing today was positive for several trees (oak, pecan), dust mites  Allergen avoidance measures discussed.  Continue albuterol 2 puffs every 4-6 hours as needed for cough, wheeze, shortness of breath  Trial on Qvar 53mcg 2 puffs twice a day  Continue allegra 180mg  daily  Continue Nasacort 2 sprays each nostril daily  Start Astelin nasal spray 2 sprays twice a day  Demonstrated proper nasal spray technique  Continue Pepcid 20mg  twice a day  Follow-up 2 months

## 2015-11-13 MED FILL — ESTRADIOL-NORETH 1.0-0.5 MG: 1-0.5 | 84 days supply | Qty: 84 | Fill #1

## 2015-11-17 ENCOUNTER — Encounter (HOSPITAL_BASED_OUTPATIENT_CLINIC_OR_DEPARTMENT_OTHER): Payer: Self-pay | Admitting: *Deleted

## 2015-11-17 ENCOUNTER — Other Ambulatory Visit: Payer: Self-pay | Admitting: Orthopedic Surgery

## 2015-11-20 MED FILL — FLUCONAZOLE 200 MG TABLET: 200 | 55 days supply | Qty: 8 | Fill #1

## 2015-11-23 ENCOUNTER — Encounter (HOSPITAL_BASED_OUTPATIENT_CLINIC_OR_DEPARTMENT_OTHER): Payer: Self-pay | Admitting: Anesthesiology

## 2015-11-23 ENCOUNTER — Ambulatory Visit (HOSPITAL_BASED_OUTPATIENT_CLINIC_OR_DEPARTMENT_OTHER)
Admission: RE | Admit: 2015-11-23 | Discharge: 2015-11-23 | Disposition: A | Discharge status: Home / Self Care | Payer: 59 | Source: Ambulatory Visit | Attending: Orthopedic Surgery | Admitting: Orthopedic Surgery

## 2015-11-23 ENCOUNTER — Ambulatory Visit (HOSPITAL_BASED_OUTPATIENT_CLINIC_OR_DEPARTMENT_OTHER): Payer: 59 | Admitting: Anesthesiology

## 2015-11-23 ENCOUNTER — Encounter (HOSPITAL_BASED_OUTPATIENT_CLINIC_OR_DEPARTMENT_OTHER)
Admission: RE | Disposition: A | Discharge status: Home / Self Care | Payer: Self-pay | Source: Ambulatory Visit | Attending: Orthopedic Surgery

## 2015-11-23 DIAGNOSIS — G4730 Sleep apnea, unspecified: Secondary | ICD-10-CM | POA: Diagnosis not present

## 2015-11-23 DIAGNOSIS — K219 Gastro-esophageal reflux disease without esophagitis: Secondary | ICD-10-CM | POA: Diagnosis not present

## 2015-11-23 DIAGNOSIS — S63650A Sprain of metacarpophalangeal joint of right index finger, initial encounter: Secondary | ICD-10-CM | POA: Insufficient documentation

## 2015-11-23 DIAGNOSIS — K5090 Crohn's disease, unspecified, without complications: Secondary | ICD-10-CM | POA: Diagnosis not present

## 2015-11-23 DIAGNOSIS — X58XXXA Exposure to other specified factors, initial encounter: Secondary | ICD-10-CM | POA: Insufficient documentation

## 2015-11-23 DIAGNOSIS — S63690A Other sprain of right index finger, initial encounter: Secondary | ICD-10-CM | POA: Diagnosis not present

## 2015-11-23 DIAGNOSIS — G4733 Obstructive sleep apnea (adult) (pediatric): Secondary | ICD-10-CM | POA: Diagnosis not present

## 2015-11-23 DIAGNOSIS — F329 Major depressive disorder, single episode, unspecified: Secondary | ICD-10-CM | POA: Diagnosis not present

## 2015-11-23 HISTORY — PX: LIGAMENT REPAIR: SHX5444

## 2015-11-23 HISTORY — DX: Gastro-esophageal reflux disease without esophagitis: K21.9

## 2015-11-23 HISTORY — DX: Sleep apnea, unspecified: G47.30

## 2015-11-23 HISTORY — DX: Personal history of Methicillin resistant Staphylococcus aureus infection: Z86.14

## 2015-11-23 HISTORY — DX: Major depressive disorder, single episode, unspecified: F32.9

## 2015-11-23 HISTORY — DX: Depression, unspecified: F32.A

## 2015-11-23 HISTORY — DX: Anxiety disorder, unspecified: F41.9

## 2015-11-23 SURGERY — REPAIR, LIGAMENT
Anesthesia: Monitor Anesthesia Care | Site: Hand | Laterality: Right

## 2015-11-23 MED ORDER — PROPOFOL 10 MG/ML IV BOLUS
INTRAVENOUS | Status: AC
  Filled 2015-11-23: qty 20

## 2015-11-23 MED ORDER — FENTANYL CITRATE (PF) 100 MCG/2ML IJ SOLN
50.0000 ug | INTRAMUSCULAR | Status: DC | PRN
  Administered 2015-11-23: 100 ug via INTRAVENOUS

## 2015-11-23 MED ORDER — LACTATED RINGERS IV SOLN
INTRAVENOUS | Status: DC
  Administered 2015-11-23: 12:00:00 via INTRAVENOUS

## 2015-11-23 MED ORDER — KETOROLAC TROMETHAMINE 30 MG/ML IJ SOLN: 30.0000 mg | Freq: Once | INTRAMUSCULAR | Status: DC | PRN

## 2015-11-23 MED ORDER — PROMETHAZINE HCL 25 MG/ML IJ SOLN: 6.2500 mg | INTRAMUSCULAR | Status: DC | PRN

## 2015-11-23 MED ORDER — LIDOCAINE HCL (CARDIAC) 20 MG/ML IV SOLN
INTRAVENOUS | Status: DC | PRN
  Administered 2015-11-23: 30 mg via INTRAVENOUS

## 2015-11-23 MED ORDER — BUPIVACAINE-EPINEPHRINE (PF) 0.5% -1:200000 IJ SOLN
INTRAMUSCULAR | Status: DC | PRN
  Administered 2015-11-23: 30 mL via PERINEURAL

## 2015-11-23 MED ORDER — MIDAZOLAM HCL 2 MG/2ML IJ SOLN
INTRAMUSCULAR | Status: AC
  Filled 2015-11-23: qty 2

## 2015-11-23 MED ORDER — DIPHENHYDRAMINE HCL 50 MG/ML IJ SOLN
12.5000 mg | Freq: Once | INTRAMUSCULAR | Status: AC
  Administered 2015-11-23: 12.5 mg via INTRAVENOUS

## 2015-11-23 MED ORDER — FENTANYL CITRATE (PF) 100 MCG/2ML IJ SOLN
INTRAMUSCULAR | Status: AC
  Filled 2015-11-23: qty 2

## 2015-11-23 MED ORDER — CEFAZOLIN SODIUM-DEXTROSE 2-4 GM/100ML-% IV SOLN
INTRAVENOUS | Status: AC
  Filled 2015-11-23: qty 100

## 2015-11-23 MED ORDER — DEXAMETHASONE SODIUM PHOSPHATE 10 MG/ML IJ SOLN
INTRAMUSCULAR | Status: AC
  Filled 2015-11-23: qty 1

## 2015-11-23 MED ORDER — ONDANSETRON HCL 4 MG/2ML IJ SOLN
INTRAMUSCULAR | Status: AC
  Filled 2015-11-23: qty 2

## 2015-11-23 MED ORDER — ONDANSETRON HCL 4 MG/2ML IJ SOLN
INTRAMUSCULAR | Status: DC | PRN
  Administered 2015-11-23: 4 mg via INTRAVENOUS

## 2015-11-23 MED ORDER — MIDAZOLAM HCL 2 MG/2ML IJ SOLN
1.0000 mg | INTRAMUSCULAR | Status: DC | PRN
  Administered 2015-11-23: 2 mg via INTRAVENOUS

## 2015-11-23 MED ORDER — CHLORHEXIDINE GLUCONATE 4 % EX LIQD: 60.0000 mL | Freq: Once | CUTANEOUS | Status: DC

## 2015-11-23 MED ORDER — PROPOFOL 500 MG/50ML IV EMUL
INTRAVENOUS | Status: DC | PRN
  Administered 2015-11-23: 75 ug/kg/min via INTRAVENOUS

## 2015-11-23 MED ORDER — SCOPOLAMINE 1 MG/3DAYS TD PT72: 1.0000 | MEDICATED_PATCH | Freq: Once | TRANSDERMAL | Status: DC | PRN

## 2015-11-23 MED ORDER — CEFAZOLIN SODIUM-DEXTROSE 2-4 GM/100ML-% IV SOLN
2.0000 g | INTRAVENOUS | Status: AC
  Administered 2015-11-23: 2 g via INTRAVENOUS

## 2015-11-23 MED ORDER — LIDOCAINE 2% (20 MG/ML) 5 ML SYRINGE
INTRAMUSCULAR | Status: AC
  Filled 2015-11-23: qty 5

## 2015-11-23 MED ORDER — FENTANYL CITRATE (PF) 100 MCG/2ML IJ SOLN: 25.0000 ug | INTRAMUSCULAR | Status: DC | PRN

## 2015-11-23 MED ORDER — OXYCODONE-ACETAMINOPHEN 5-325 MG PO TABS: ORAL_TABLET | ORAL | 0 refills | Status: DC

## 2015-11-23 MED ORDER — MEPERIDINE HCL 25 MG/ML IJ SOLN: 6.2500 mg | INTRAMUSCULAR | Status: DC | PRN

## 2015-11-23 MED ORDER — DIPHENHYDRAMINE HCL 50 MG/ML IJ SOLN
INTRAMUSCULAR | Status: AC
  Filled 2015-11-23: qty 1

## 2015-11-23 MED FILL — OXYCODONE W/APAP 5/325 TAB: 5-325 | 3 days supply | Qty: 20 | Fill #0

## 2015-11-23 SURGICAL SUPPLY — 68 items
ANCHOR JUGGERKNOT 1.0 3-0 NLD (Anchor) ×2 IMPLANT
BANDAGE ACE 3X5.8 VEL STRL LF (GAUZE/BANDAGES/DRESSINGS) ×2 IMPLANT
BLADE MINI RND TIP GREEN BEAV (BLADE) ×2 IMPLANT
BLADE SURG 15 STRL LF DISP TIS (BLADE) ×2 IMPLANT
BLADE SURG 15 STRL SS (BLADE) ×2
BNDG ELASTIC 2X5.8 VLCR STR LF (GAUZE/BANDAGES/DRESSINGS) IMPLANT
BNDG ESMARK 4X9 LF (GAUZE/BANDAGES/DRESSINGS) ×2 IMPLANT
BNDG GAUZE ELAST 4 BULKY (GAUZE/BANDAGES/DRESSINGS) ×2 IMPLANT
CATH ROBINSON RED A/P 8FR (CATHETERS) IMPLANT
CHLORAPREP W/TINT 26ML (MISCELLANEOUS) IMPLANT
CORDS BIPOLAR (ELECTRODE) ×2 IMPLANT
COVER BACK TABLE 60X90IN (DRAPES) ×2 IMPLANT
COVER MAYO STAND STRL (DRAPES) ×2 IMPLANT
CUFF TOURNIQUET SINGLE 18IN (TOURNIQUET CUFF) ×2 IMPLANT
DECANTER SPIKE VIAL GLASS SM (MISCELLANEOUS) IMPLANT
DRAPE EXTREMITY T 121X128X90 (DRAPE) ×2 IMPLANT
DRAPE OEC MINIVIEW 54X84 (DRAPES) IMPLANT
DRAPE SURG 17X23 STRL (DRAPES) ×2 IMPLANT
DRSG PAD ABDOMINAL 8X10 ST (GAUZE/BANDAGES/DRESSINGS) IMPLANT
DURAPREP 26ML APPLICATOR (WOUND CARE) ×2 IMPLANT
GAUZE SPONGE 4X4 12PLY STRL (GAUZE/BANDAGES/DRESSINGS) ×2 IMPLANT
GAUZE XEROFORM 1X8 LF (GAUZE/BANDAGES/DRESSINGS) ×2 IMPLANT
GLOVE BIO SURGEON STRL SZ 6.5 (GLOVE) ×2 IMPLANT
GLOVE BIO SURGEON STRL SZ7.5 (GLOVE) ×2 IMPLANT
GLOVE BIOGEL PI IND STRL 7.0 (GLOVE) ×2 IMPLANT
GLOVE BIOGEL PI IND STRL 8 (GLOVE) ×1 IMPLANT
GLOVE BIOGEL PI IND STRL 8.5 (GLOVE) ×1 IMPLANT
GLOVE BIOGEL PI INDICATOR 7.0 (GLOVE) ×2
GLOVE BIOGEL PI INDICATOR 8 (GLOVE) ×1
GLOVE BIOGEL PI INDICATOR 8.5 (GLOVE) ×1
GLOVE SURG ORTHO 8.0 STRL STRW (GLOVE) ×2 IMPLANT
GOWN STRL REUS W/ TWL LRG LVL3 (GOWN DISPOSABLE) ×1 IMPLANT
GOWN STRL REUS W/TWL LRG LVL3 (GOWN DISPOSABLE) ×1
GOWN STRL REUS W/TWL XL LVL3 (GOWN DISPOSABLE) ×4 IMPLANT
K-WIRE .035X4 (WIRE) IMPLANT
NDL SAFETY ECLIPSE 18X1.5 (NEEDLE) IMPLANT
NEEDLE HYPO 18GX1.5 SHARP (NEEDLE)
NEEDLE HYPO 25X1 1.5 SAFETY (NEEDLE) IMPLANT
NEEDLE KEITH (NEEDLE) IMPLANT
NS IRRIG 1000ML POUR BTL (IV SOLUTION) ×2 IMPLANT
PACK BASIN DAY SURGERY FS (CUSTOM PROCEDURE TRAY) ×2 IMPLANT
PAD CAST 3X4 CTTN HI CHSV (CAST SUPPLIES) ×1 IMPLANT
PAD CAST 4YDX4 CTTN HI CHSV (CAST SUPPLIES) IMPLANT
PADDING CAST ABS 4INX4YD NS (CAST SUPPLIES) ×1
PADDING CAST ABS COTTON 4X4 ST (CAST SUPPLIES) ×1 IMPLANT
PADDING CAST COTTON 3X4 STRL (CAST SUPPLIES) ×1
PADDING CAST COTTON 4X4 STRL (CAST SUPPLIES)
PASSER SUT SWANSON 36MM LOOP (INSTRUMENTS) IMPLANT
SLEEVE SCD COMPRESS KNEE MED (MISCELLANEOUS) IMPLANT
SLING ARM FOAM STRAP MED (SOFTGOODS) ×2 IMPLANT
SPLINT PLASTER CAST XFAST 3X15 (CAST SUPPLIES) ×1 IMPLANT
SPLINT PLASTER XTRA FASTSET 3X (CAST SUPPLIES) ×1
STOCKINETTE 4X48 STRL (DRAPES) ×2 IMPLANT
SUT ETHIBOND 3-0 V-5 (SUTURE) IMPLANT
SUT ETHILON 3 0 PS 1 (SUTURE) IMPLANT
SUT ETHILON 4 0 PS 2 18 (SUTURE) IMPLANT
SUT FIBERWIRE 2-0 18 17.9 3/8 (SUTURE)
SUT MERSILENE 2.0 SH NDLE (SUTURE) IMPLANT
SUT MERSILENE 4 0 P 3 (SUTURE) IMPLANT
SUT MERSILENE 6 0 P 1 (SUTURE) IMPLANT
SUT SILK 4 0 PS 2 (SUTURE) IMPLANT
SUT VIC AB 0 SH 27 (SUTURE) IMPLANT
SUT VICRYL 4-0 PS2 18IN ABS (SUTURE) IMPLANT
SUTURE FIBERWR 2-0 18 17.9 3/8 (SUTURE) IMPLANT
SYR BULB 3OZ (MISCELLANEOUS) ×2 IMPLANT
SYR CONTROL 10ML LL (SYRINGE) IMPLANT
TOWEL OR 17X24 6PK STRL BLUE (TOWEL DISPOSABLE) ×4 IMPLANT
UNDERPAD 30X30 (UNDERPADS AND DIAPERS) ×2 IMPLANT

## 2015-11-23 NOTE — Anesthesia Postprocedure Evaluation (Signed)
Anesthesia Post Note  Patient: [name]  Procedure(s) Performed: Procedure(s) (LRB): right index radial collateral LIGAMENT REPAIR (Right)  Patient location during evaluation: PACU Anesthesia Type: MAC and Regional Level of consciousness: awake and alert Pain management: pain level controlled Vital Signs Assessment: post-procedure vital signs reviewed and stable Respiratory status: spontaneous breathing Cardiovascular status: stable Anesthetic complications: no    Last Vitals:  Vitals:   11/23/15 1430 11/23/15 1507  BP: (!) 111/59 (!) 106/45  Pulse: 70 62  Resp: (!) 23 16  Temp:  36.9 C    Last Pain:  Vitals:   11/23/15 1507  TempSrc: Oral  PainSc: 0-No pain                 Nolon Nations

## 2015-11-23 NOTE — Anesthesia Procedure Notes (Signed)
Anesthesia Regional Block:  Axillary brachial plexus block  Pre-Anesthetic Checklist: ,, timeout performed, Correct Patient, Correct Site, Correct Laterality, Correct Procedure, Correct Position, site marked, Risks and benefits discussed,  Surgical consent,  Pre-op evaluation,  At surgeon's request and post-op pain management  Laterality: Right  Prep: chloraprep       Needles:  Injection technique: Single-shot  Needle Type: Stimulator Needle - 40     Needle Length: 4cm 4 cm Needle Gauge: 22 and 22 G    Additional Needles:  Procedures: nerve stimulator Axillary brachial plexus block Narrative:  Injection made incrementally with aspirations every 5 mL. Anesthesiologist: Nolon Nations  Additional Notes: BP cuff, EKG monitors applied. Sedation begun. Nerve location verified with U/S. Anesthetic injected incrementally, slowly , and after neg aspirations under direct u/s guidance. Good perineural spread. Tolerated well.

## 2015-11-23 NOTE — Op Note (Signed)
I assisted Surgeon(s) and Role:    * Leanora Cover, MD - Primary    * Daryll Brod, MD - Assisting on the Procedure(s): right index radial collateral LIGAMENT REPAIR on 11/23/2015.  I provided assistance on this case as follows: approach, dissection, identification of the lesion, repair and closure including application of the dressing and splint. I was present for the entire case.   Electronically signed by: Wynonia Sours, MD Date: 11/23/2015 Time: 2:02 PM

## 2015-11-23 NOTE — Anesthesia Procedure Notes (Signed)
Procedure Name: MAC Date/Time: 11/23/2015 1:03 PM Performed by: Asencion Loveday D Pre-anesthesia Checklist: Patient identified, Emergency Drugs available, Suction available, Patient being monitored and Timeout performed Patient Re-evaluated:Patient Re-evaluated prior to inductionOxygen Delivery Method: Simple face mask

## 2015-11-23 NOTE — Transfer of Care (Addendum)
Immediate Anesthesia Transfer of Care Note  Patient: [name]  Procedure(s) Performed: Procedure(s): right index radial collateral LIGAMENT REPAIR versus reconstruction possible retinacular graft (Right)  Patient Location: PACU  Anesthesia Type:MAC regional  Level of Consciousness: awake, alert , oriented and patient cooperative  Airway & Oxygen Therapy: Patient Spontanous Breathing and Patient connected to face mask oxygen  Post-op Assessment: Report given to RN and Post -op Vital signs reviewed and stable  Post vital signs: Reviewed and stable  Last Vitals:  Vitals:   11/23/15 1230 11/23/15 1235  BP: (!) 108/52   Pulse: (!) 57 74  Resp: 10 (!) 23  Temp:      Last Pain:  Vitals:   11/23/15 1145  TempSrc: Oral         Complications: No apparent anesthesia complications

## 2015-11-23 NOTE — H&P (Signed)
  [name] is an 55 y.o. female.   Chief Complaint: right index collateral ligament injury HPI: 55 yo female with right index finger radial collateral ligament injury.  She has treated this non operatively with some improvement but continued pain in the mp joint of the finger.  She wishes to have repair vs reconstruction of the ligament.  Allergies:  Allergies  Allergen Reactions  . Hydrocodone Itching    Past Medical History:  Diagnosis Date  . Anxiety   . Crohn disease ([place])   . Depression   . GERD (gastroesophageal reflux disease)   . History of methicillin resistant staphylococcus aureus (MRSA)   . Sleep apnea    CPAP    Past Surgical History:  Procedure Laterality Date  . CESAREAN SECTION      Family History: Family History  Problem Relation Age of Onset  . Alcohol abuse Mother   . Alcohol abuse Sister   . Angioedema Neg Hx   . Allergic rhinitis Neg Hx   . Asthma Neg Hx   . Atopy Neg Hx   . Eczema Neg Hx   . Immunodeficiency Neg Hx   . Urticaria Neg Hx     Social History:   reports that she has never smoked. She has never used smokeless tobacco. She reports that she drinks alcohol. She reports that she does not use drugs.  Medications: No prescriptions prior to admission.    No results found for this or any previous visit (from the past 48 hour(s)).  No results found.   A comprehensive review of systems was negative.  Height 5\' 2"  (1.575 m), weight 66.7 kg (147 lb), last menstrual period 01/10/2014.  General appearance: alert, cooperative and appears stated age Head: Normocephalic, without obvious abnormality, atraumatic Neck: supple, symmetrical, trachea midline Resp: clear to auscultation bilaterally Cardio: regular rate and rhythm GI: non-tender Extremities: Intact sensation and capillary refill all digits.  +epl/fpl/io.  No wounds.  Pulses: 2+ and symmetric Skin: Skin color, texture, turgor normal. No rashes or lesions Neurologic:  Grossly normal Incision/Wound:none  Assessment/Plan Right index finger radial collateral ligament injury.  Non operative and operative treatment options were discussed with the patient and patient wishes to proceed with operative treatment. Risks, benefits, and alternatives of surgery were discussed and the patient agrees with the plan of care.   Tanaka Gillen R 11/23/2015, 9:40 AM

## 2015-11-23 NOTE — Anesthesia Preprocedure Evaluation (Signed)
Anesthesia Evaluation  Patient identified by MRN, date of birth, ID band Patient awake    Reviewed: Allergy & Precautions, NPO status , Patient's Chart, lab work & pertinent test results  Airway Mallampati: II  TM Distance: >3 FB Neck ROM: Full    Dental no notable dental hx.    Pulmonary sleep apnea ,    Pulmonary exam normal breath sounds clear to auscultation       Cardiovascular negative cardio ROS Normal cardiovascular exam Rhythm:Regular Rate:Normal     Neuro/Psych negative neurological ROS  negative psych ROS   GI/Hepatic Neg liver ROS, GERD  ,  Endo/Other  negative endocrine ROS  Renal/GU negative Renal ROS     Musculoskeletal negative musculoskeletal ROS (+)   Abdominal   Peds  Hematology negative hematology ROS (+)   Anesthesia Other Findings   Reproductive/Obstetrics negative OB ROS                            Anesthesia Physical Anesthesia Plan  ASA: II  Anesthesia Plan: Regional   Post-op Pain Management:    Induction:   Airway Management Planned:   Additional Equipment:   Intra-op Plan:   Post-operative Plan:   Informed Consent: I have reviewed the patients History and Physical, chart, labs and discussed the procedure including the risks, benefits and alternatives for the proposed anesthesia with the patient or authorized representative who has indicated his/her understanding and acceptance.   Dental advisory given  Plan Discussed with: CRNA  Anesthesia Plan Comments:         Anesthesia Quick Evaluation

## 2015-11-23 NOTE — Discharge Instructions (Addendum)

## 2015-11-23 NOTE — Progress Notes (Signed)
Assisted Dr. Germeroth with right, ultrasound guided, axillary block. Side rails up, monitors on throughout procedure. See vital signs in flow sheet. Tolerated Procedure well. 

## 2015-11-23 NOTE — Brief Op Note (Signed)
11/23/2015  1:57 PM  PATIENT:  [name]  55 y.o. female  PRE-OPERATIVE DIAGNOSIS:  right index radial collateral ligament tear  POST-OPERATIVE DIAGNOSIS:  right index radial collateral ligament tear  PROCEDURE:  Procedure(s): right index radial collateral LIGAMENT REPAIR versus reconstruction possible retinacular graft (Right)  SURGEON:  Surgeon(s) and Role:    * Leanora Cover, MD - Primary    * Daryll Brod, MD - Assisting  PHYSICIAN ASSISTANT:   ASSISTANTS: Daryll Brod, MD   ANESTHESIA:   regional and MAC  EBL:  Total I/O In: 700 [I.V.:700] Out: -   BLOOD ADMINISTERED:none  DRAINS: none   LOCAL MEDICATIONS USED:  NONE  SPECIMEN:  No Specimen  DISPOSITION OF SPECIMEN:  N/A  COUNTS:  YES  TOURNIQUET:   Total Tourniquet Time Documented: Upper Arm (Right) - 30 minutes Total: Upper Arm (Right) - 30 minutes   DICTATION: .Other Dictation: Dictation Number no number given  PLAN OF CARE: Discharge to home after PACU  PATIENT DISPOSITION:  PACU - hemodynamically stable.

## 2015-11-24 ENCOUNTER — Encounter (HOSPITAL_BASED_OUTPATIENT_CLINIC_OR_DEPARTMENT_OTHER): Payer: Self-pay | Admitting: Orthopedic Surgery

## 2015-11-24 NOTE — Op Note (Deleted)
  The note originally documented on this encounter has been moved the the encounter in which it belongs.  

## 2015-11-24 NOTE — Op Note (Signed)
NAMEKAZMIRA, HARMENING NO.:  [id number]  MEDICAL RECORD NO.:  [id number]  LOCATION:                                 FACILITY:  PHYSICIAN:  Leanora Cover, MD        DATE OF BIRTH:  Sep 12, 1960  DATE OF PROCEDURE:  11/23/2015 DATE OF DISCHARGE:                              OPERATIVE REPORT   PREOPERATIVE DIAGNOSIS:  Right index finger MP joint radial collateral ligament tear.  POSTOPERATIVE DIAGNOSIS:  Right index finger MP joint radial collateral ligament tear.  PROCEDURE:  Right index finger radial collateral ligament repair.  SURGEON:  Leanora Cover, MD.  ASSISTANT:  Daryll Brod, MD.  ANESTHESIA:  Regional with MAC.  IV FLUIDS:  Per anesthesia flow sheet.  ESTIMATED BLOOD LOSS:  Minimal.  COMPLICATIONS:  None.  SPECIMENS:  None.  TOURNIQUET TIME:  30 minutes.  DISPOSITION:  Stable to PACU.  INDICATIONS:  Ms. Dougal is a 55 year old female, who injured her right index finger radial collateral ligament at the MP joint.  She attempted to manage this nonoperatively with splinting.  She has continued to have pain.  She wished to have a repair of the ligament for management issues.  We discussed risks, benefits, and alternatives of surgery including the risk of blood loss; infection; damage to nerves, vessels, tendons, ligaments, bone; failure of surgery; need for additional surgery; complications with wound healing, continued pain, and stiffness.  She voiced understanding of these risks and elected to proceed.  OPERATIVE COURSE:  After being identified preoperatively by myself, the patient and I agreed upon the procedure and site of procedure.  Surgical site was marked.  The risks, benefits, and alternatives of surgery were reviewed, and she wished to proceed.  Surgical consent had been signed. She was given IV Ancef as preoperative antibiotic prophylaxis.  She was transferred to the operating room and placed on the operating room table in supine  position with the right upper extremity on arm board.  MAC anesthesia induced by Anesthesiology.  A regional block had been performed by Anesthesia in the preoperative holding area.  The right upper extremity was prepped and draped in normal sterile orthopedic fashion.  Surgical pause was performed between surgeons, anesthesia, and operating staff, and all were in agreement as to the patient, procedure, and site of procedure.  Tourniquet at the proximal aspect of the extremity was inflated to 250 mmHg after exsanguination of the limb with an Esmarch bandage.  An incision was made at the radial side of the index finger MP joint, carried into the subcutaneous tissues by spreading technique.  Bipolar electrocautery was used to obtain hemostasis.  Care was taken to protect all neurovascular structures. The sagittal bands were sharply incised.  The joint was identified.  It was entered.  The collateral ligament was identified.  It was intact in its midsection.  It appeared nicely attached at the proximal phalanx. At the sulcus of the metacarpal head, it was detached except for the edges.  This was then freed up with the [facility] blade.  There was degenerative tissue within the metacarpal head sulcus.  This was removed with a house curette.  A JuggerKnot suture anchor was  then used.  It was placed using standard technique.  The suture anchor was used to repair the collateral ligament origin back into the sulcus.  This was done with the finger flexed approximately 45 degrees.  This provided good repair of the ligament.  The suture was then used to repair the edges of the ligament as well.  The wound was copiously irrigated with sterile saline.  The sagittal bands were repaired with a 4-0 Mersilene in running fashion.  The skin was closed with 4-0 nylon in a horizontal mattress fashion.  The wound was dressed with sterile Xeroform, 4x4s, and wrapped with a Kerlix bandage.  A volar splint was placed  and wrapped with Kerlix and Ace bandage.  Tourniquet was deflated at 30 minutes.  Fingertips were pink with brisk capillary refill after deflation of tourniquet.  Operative drapes were broken down.  The patient was awakened from anesthesia safely.  She was transferred back to a stretcher and taken to PACU in stable condition.  I will see her back in the office in 1 week for postoperative followup.  I will give her Percocet 5/325 one to two p.o. q.6 hours p.r.n. pain, dispensed #20.     Leanora Cover, MD     KK/MEDQ  D:  11/23/2015  T:  11/24/2015  Job:  [id number]

## 2015-11-27 ENCOUNTER — Telehealth: Payer: Self-pay | Admitting: Allergy

## 2015-11-27 MED FILL — FOLIC ACID 1 MG TABLET: 1 | 90 days supply | Qty: 90 | Fill #1

## 2015-11-27 MED FILL — ESCITALOPRAM 10 MG TABLET: 10 | 90 days supply | Qty: 90 | Fill #1

## 2015-11-27 NOTE — Telephone Encounter (Signed)
Patient informed she may use medications as needed. Patient agreed with plan.

## 2015-11-27 NOTE — Telephone Encounter (Signed)
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  If cough is resolved she may discontinue use of Qvar and may use 2 puffs twice a day in future if/when cough returns.  Nasal spray she can also use as needed for runny or congested nose.  Thanks.

## 2015-11-27 NOTE — Telephone Encounter (Signed)
Pt called and needs to talk with Dr. Nelva Bush about about the qvar and nasel spray, want to know if she needs to stay on it because her cough went away. [phone]

## 2015-11-28 DIAGNOSIS — K5010 Crohn's disease of large intestine without complications: Secondary | ICD-10-CM | POA: Diagnosis not present

## 2015-11-28 DIAGNOSIS — Z23 Encounter for immunization: Secondary | ICD-10-CM | POA: Diagnosis not present

## 2015-11-28 DIAGNOSIS — M2550 Pain in unspecified joint: Secondary | ICD-10-CM | POA: Diagnosis not present

## 2015-11-28 DIAGNOSIS — K5090 Crohn's disease, unspecified, without complications: Secondary | ICD-10-CM | POA: Diagnosis not present

## 2015-11-29 DIAGNOSIS — M79644 Pain in right finger(s): Secondary | ICD-10-CM | POA: Diagnosis not present

## 2015-11-29 DIAGNOSIS — F411 Generalized anxiety disorder: Secondary | ICD-10-CM | POA: Diagnosis not present

## 2015-12-11 DIAGNOSIS — M79644 Pain in right finger(s): Secondary | ICD-10-CM | POA: Diagnosis not present

## 2015-12-11 DIAGNOSIS — F411 Generalized anxiety disorder: Secondary | ICD-10-CM | POA: Diagnosis not present

## 2015-12-14 DIAGNOSIS — F411 Generalized anxiety disorder: Secondary | ICD-10-CM | POA: Diagnosis not present

## 2015-12-19 MED FILL — HUMIRA 40 MG/0.8ML PSKT: 40 | 28 days supply | Qty: 4 | Fill #1

## 2015-12-21 DIAGNOSIS — M79644 Pain in right finger(s): Secondary | ICD-10-CM | POA: Diagnosis not present

## 2015-12-26 DIAGNOSIS — M79644 Pain in right finger(s): Secondary | ICD-10-CM | POA: Diagnosis not present

## 2015-12-27 DIAGNOSIS — F411 Generalized anxiety disorder: Secondary | ICD-10-CM | POA: Diagnosis not present

## 2015-12-29 ENCOUNTER — Ambulatory Visit (INDEPENDENT_AMBULATORY_CARE_PROVIDER_SITE_OTHER): Payer: 59 | Admitting: Internal Medicine

## 2015-12-29 ENCOUNTER — Encounter: Payer: Self-pay | Admitting: Internal Medicine

## 2015-12-29 VITALS — BP 110/72 | HR 95 | Temp 98.1°F | Ht 62.0 in | Wt 146.0 lb

## 2015-12-29 DIAGNOSIS — A4902 Methicillin resistant Staphylococcus aureus infection, unspecified site: Secondary | ICD-10-CM | POA: Diagnosis not present

## 2015-12-29 DIAGNOSIS — F439 Reaction to severe stress, unspecified: Secondary | ICD-10-CM

## 2015-12-29 DIAGNOSIS — K5090 Crohn's disease, unspecified, without complications: Secondary | ICD-10-CM

## 2015-12-29 DIAGNOSIS — Z23 Encounter for immunization: Secondary | ICD-10-CM | POA: Diagnosis not present

## 2015-12-29 DIAGNOSIS — K130 Diseases of lips: Secondary | ICD-10-CM | POA: Diagnosis not present

## 2015-12-29 MED ORDER — CLOTRIMAZOLE-BETAMETHASONE 1-0.05 % EX CREA: 1.0000 "application " | TOPICAL_CREAM | Freq: Two times a day (BID) | CUTANEOUS | 0 refills | Status: DC

## 2015-12-29 MED ORDER — DOXYCYCLINE HYCLATE 100 MG PO TABS: 100.0000 mg | ORAL_TABLET | Freq: Two times a day (BID) | ORAL | 0 refills | Status: DC

## 2015-12-29 MED ORDER — VALACYCLOVIR HCL 500 MG PO TABS: 500.0000 mg | ORAL_TABLET | Freq: Every day | ORAL | 0 refills | Status: AC

## 2015-12-29 MED FILL — CLOTRIMAZOLE-BETAMETHASONE: 1-0.05 | 10 days supply | Qty: 30 | Fill #0

## 2015-12-29 MED FILL — DOXYCYCLINE HYCLATE 100 MG: 100 | 10 days supply | Qty: 20 | Fill #0

## 2015-12-29 MED FILL — VALACYCLOVIR HCL 500 MG TAB: 500 | 5 days supply | Qty: 5 | Fill #0

## 2016-01-02 ENCOUNTER — Ambulatory Visit (INDEPENDENT_AMBULATORY_CARE_PROVIDER_SITE_OTHER): Payer: 59 | Admitting: Pulmonary Disease

## 2016-01-02 ENCOUNTER — Encounter: Payer: Self-pay | Admitting: Pulmonary Disease

## 2016-01-02 ENCOUNTER — Ambulatory Visit: Payer: 59 | Admitting: Pulmonary Disease

## 2016-01-02 DIAGNOSIS — M79644 Pain in right finger(s): Secondary | ICD-10-CM | POA: Diagnosis not present

## 2016-01-02 DIAGNOSIS — G4733 Obstructive sleep apnea (adult) (pediatric): Secondary | ICD-10-CM

## 2016-01-02 DIAGNOSIS — H1010 Acute atopic conjunctivitis, unspecified eye: Secondary | ICD-10-CM | POA: Diagnosis not present

## 2016-01-02 DIAGNOSIS — J309 Allergic rhinitis, unspecified: Secondary | ICD-10-CM

## 2016-01-02 NOTE — Progress Notes (Signed)
   Subjective:    Patient ID: [name], female    DOB: 1960/08/19, 55 y.o.   MRN: [id number]  HPI   55 yo female with Mild OSA  Has Crohn's disease  01/02/2016  Chief Complaint  Patient presents with  . Follow-up    Pt. uses a cpap machine, Pt. having trouble breathing with the machine, tries to use it every night, DME: [facility]    She was started on auto CPAP in 2016 with good results In terms of daytime tiredness and somnolence. She had a chronic cough that lasted for almost 4 months over the summer and fall -she finally saw an allergist, received Qvar and Astelin nasal spray which finally seemed to subside the cough . she was also maintained on Pepcid for reflux   She is now back on the CPAP machine and download shows good compliance with average pressure 9 cm, on auto settings with good control of events and minimal leak .  Unfortunately she has a right wrist splint due to a ligament tear. And she continues to have problems with staph infections and is on doxycycline   She complains of nasal congestion despite of using Allegra and Astelin nasal spray        Significant tests/ events  HST 11/2014 [facility] 12/hr , SaO2 86%.    Review of Systems neg for any significant sore throat, dysphagia, itching, sneezing, nasal congestion or excess/ purulent secretions, fever, chills, sweats, unintended wt loss, pleuritic or exertional cp, hempoptysis, orthopnea pnd or change in chronic leg swelling. Also denies presyncope, palpitations, heartburn, abdominal pain, nausea, vomiting, diarrhea or change in bowel or urinary habits, dysuria,hematuria, rash, arthralgias, visual complaints, headache, numbness weakness or ataxia.     Objective:   Physical Exam   Gen. Pleasant, well-nourished, in no distress ENT - no lesions, no post nasal drip Neck: No JVD, no thyromegaly, no carotid bruits Lungs: no use of accessory muscles, no dullness to percussion, clear without rales or rhonchi    Cardiovascular: Rhythm regular, heart sounds  normal, no murmurs or gallops, no peripheral edema Musculoskeletal: No deformities, no cyanosis or clubbing         Assessment & Plan:

## 2016-01-02 NOTE — Patient Instructions (Signed)
Stay on Astelin nasal spray Okay to take storebrand Sudafed for one week for nasal congestion  Change CPAP to 9 cm  We discussed overall appliance- can Refer to Dentist in the Future

## 2016-01-02 NOTE — Assessment & Plan Note (Signed)
Change CPAP to 9 cm  We discussed dental appliance- can Refer to Dentist in the Future  Weight loss encouraged, compliance with goal of at least 4-6 hrs every night is the expectation. Advised against medications with sedative side effects Cautioned against driving when sleepy - understanding that sleepiness will vary on a day to day basis

## 2016-01-02 NOTE — Assessment & Plan Note (Signed)
Stay on Astelin nasal spray Okay to take storebrand Sudafed for one week for nasal congestion

## 2016-01-10 DIAGNOSIS — F411 Generalized anxiety disorder: Secondary | ICD-10-CM | POA: Diagnosis not present

## 2016-01-11 ENCOUNTER — Ambulatory Visit: Payer: 59 | Admitting: Allergy

## 2016-01-11 ENCOUNTER — Encounter: Payer: Self-pay | Admitting: Pulmonary Disease

## 2016-01-11 DIAGNOSIS — M79644 Pain in right finger(s): Secondary | ICD-10-CM | POA: Diagnosis not present

## 2016-01-12 NOTE — Patient Instructions (Signed)
Use Lotrisone cream in corner of mouth twice daily until lesion heals. Doxycycline 100 mg twice daily for 10 days.

## 2016-01-12 NOTE — Progress Notes (Signed)
   Subjective:    Patient ID: [name], female    DOB: [birth year], 55 y.o.   MRN: [id number]  HPI Patient with history of Crohn's disease presents with cracking in corner of mouth. Also has lesion on lip that she thinks could be MRSA. Has situational stress.    Review of Systems see above     Objective:   Physical Exam Lesion consistent with cheilosis right corner of mouth. Upper lip slightly swollen. Patient indicates it's quite tender.       Assessment & Plan:  Cheilosis-explained to her that that she is usually a yeast infection. Prescribed Lotrisone cream twice daily for this. Regarding upper lip swelling, this does not appear to be angioedema. Cannot be certain if she has another MRSA infection but have prescribed doxycycline 100 mg twice daily for 10 days.  She'll let me know if these lesions do not improve.

## 2016-01-22 ENCOUNTER — Encounter: Payer: Self-pay | Admitting: Allergy

## 2016-01-22 ENCOUNTER — Ambulatory Visit (INDEPENDENT_AMBULATORY_CARE_PROVIDER_SITE_OTHER): Payer: 59 | Admitting: Allergy

## 2016-01-22 VITALS — BP 118/70 | HR 81 | Temp 97.6°F | Resp 19 | Ht 62.5 in | Wt 148.0 lb

## 2016-01-22 DIAGNOSIS — K219 Gastro-esophageal reflux disease without esophagitis: Secondary | ICD-10-CM

## 2016-01-22 DIAGNOSIS — H1010 Acute atopic conjunctivitis, unspecified eye: Secondary | ICD-10-CM

## 2016-01-22 DIAGNOSIS — J45991 Cough variant asthma: Secondary | ICD-10-CM

## 2016-01-22 DIAGNOSIS — J309 Allergic rhinitis, unspecified: Secondary | ICD-10-CM | POA: Diagnosis not present

## 2016-01-22 NOTE — Patient Instructions (Addendum)
Continue allergen avoidance measures  Resume use of Qvar 62mcg 2 puffs twice a day.   Use during periods of cough for presumed cough variant asthma.   Use albuterol 2 puffs every 4-6 hours as needed for cough, wheeze, shortness of breath  Use allegra 180mg  daily  Use Nasacort 2 sprays each nostril daily.  Use for 1-2 weeks at a time before stopping.    Start Mucinex 1200mg  daily (may use up to twice a day) and drink with plenty of water to help thin mucus and help with cough.    Resume Astelin nasal spray 2 sprays twice a day once your nasal congestion has improved  Continue Pepcid 20mg  twice a day  Follow-up 2 months

## 2016-01-22 NOTE — Progress Notes (Signed)
Follow-up Note  RE: [name] MRN: [id number] DOB: 25-Oct-1960 Date of Office Visit: 01/22/2016   History of present illness: [name] is a 56 y.o. female presenting today for follow-up of allergic rhinoconjunctivitis, cough and reflux.  She was last seen for initial visit on 11/08/15.  At that time she was trialed on Qvar 24mg 2 puffs twice a day and albuterol as needed.   For her allergies (sensitivity to trees, dust mites) she was started on astelin spray.    She states after this visit she used to Qvar 40 while and her cough stopped and her symptoms improved that she stop using the Qvar and had no need for albuterol. She also did not need to take the nasal spray or use the antihistamine. Around Christmas however her congestion worse and she started coughing again. She states she has been coughing up a bit of mucus. Outside of taking Sudafed she has not resumed any of her medications previously prescribed. She denies any fevers.   With her reflux she continues on pepcid.      Review of systems: Review of Systems  Constitutional: Negative for chills, fever and malaise/fatigue.  HENT: Positive for congestion. Negative for ear pain, nosebleeds, sinus pain and sore throat.   Eyes: Negative for discharge and redness.  Respiratory: Positive for cough and sputum production. Negative for shortness of breath and wheezing.   Cardiovascular: Negative for chest pain.  Gastrointestinal: Negative for abdominal pain, heartburn, nausea and vomiting.  Skin: Negative for itching and rash.    All other systems negative unless noted above in HPI  Past medical/social/surgical/family history have been reviewed and are unchanged unless specifically indicated below.  No changes  Medication List: Allergies as of 01/22/2016      Reactions   Chlorhexidine Gluconate Itching   Hydrocodone Itching      Medication List       Accurate as of 01/22/16 12:16 PM. Always use your most recent med  list.          Adalimumab 40 MG/0.8ML Pskt Commonly known as:  HUMIRA Inject 0.8 mLs (40 mg total) into the skin once a week.   ALPRAZolam 0.25 MG tablet Commonly known as:  XANAX Take 1 tablet (0.25 mg total) by mouth 2 (two) times daily as needed for anxiety.   azelastine 0.1 % nasal spray Commonly known as:  ASTELIN Place 2 sprays into both nostrils 2 (two) times daily.   beclomethasone 80 MCG/ACT inhaler Commonly known as:  QVAR Inhale 2 puffs into the lungs 2 (two) times daily.   clotrimazole-betamethasone cream Commonly known as:  LOTRISONE Apply 1 application topically 2 (two) times daily.   doxycycline 100 MG tablet Commonly known as:  VIBRA-TABS Take 1 tablet (100 mg total) by mouth 2 (two) times daily.   escitalopram 10 MG tablet Commonly known as:  LEXAPRO TAKE 1 TABLET (10 MG TOTAL) BY MOUTH DAILY.   estradiol-norethindrone 1-0.5 MG tablet Commonly known as:  ACTIVELLA Take 1 tablet by mouth daily.   famotidine 20 MG tablet Commonly known as:  PEPCID Take 20 mg by mouth daily.   fluocinonide ointment 0.05 % Commonly known as:  LIDEX Apply 1 application topically daily as needed.   VENTOLIN HFA 108 (90 Base) MCG/ACT inhaler Generic drug:  albuterol INHALE 2 PUFFS INTO THE LUNGS EVERY 6 HOURS AS NEEDED FOR WHEEZING OR SHORTNESS OF BREATH.       Known medication allergies: Allergies  Allergen Reactions  .  Chlorhexidine Gluconate Itching  . Hydrocodone Itching     Physical examination: Blood pressure 118/70, pulse 81, temperature 97.6 F (36.4 C), temperature source Oral, resp. rate 19, height 5' 2.5" (1.588 m), weight 148 lb (67.1 kg), last menstrual period 01/10/2014, SpO2 97 %.  General: Alert, interactive, in no acute distress. HEENT: TMs pearly gray, turbinates moderately edematous with clear discharge, post-pharynx non erythematous. Neck: Supple without lymphadenopathy. Lungs: Clear to auscultation without wheezing, rhonchi or rales.  {no increased work of breathing. CV: Normal S1, S2 without murmurs. Abdomen: Nondistended, nontender. Skin: Warm and dry, without lesions or rashes. Extremities:  No clubbing, cyanosis or edema. Neuro:   Grossly intact.  Diagnositics/Labs:  Spirometry: FEV1: 2.37L  97%, FVC: 2.75L  93%, ratio consistent with Nonobstructive pattern  Assessment and plan:   Allergic rhinoconjunctivitis  - Continue allergen avoidance measures  - Use allegra 165m daily  - Use Nasacort 2 sprays each nostril daily.  Use for 1-2 weeks at a time before stopping.  - Resume Astelin nasal spray 2 sprays twice a day once your nasal congestion has improved  Cough, asthma variant  - Resume use of Qvar 877m 2 puffs twice a day.   Use during periods of cough for presumed cough variant asthma.   - Use albuterol 2 puffs every 4-6 hours as needed for cough, wheeze, shortness of breath  - Start Mucinex 120038maily (may use up to twice a day) and drink with plenty of water to help thin mucus and help with cough.    LPR   - Continue Pepcid 6m49mice a day  Follow-up 2-3 months  I appreciate the opportunity to take part in Dail's care. Please do not hesitate to contact me with questions.  Sincerely,   ShayPrudy Feeler [facility]

## 2016-01-24 DIAGNOSIS — F411 Generalized anxiety disorder: Secondary | ICD-10-CM | POA: Diagnosis not present

## 2016-02-07 DIAGNOSIS — F411 Generalized anxiety disorder: Secondary | ICD-10-CM | POA: Diagnosis not present

## 2016-02-09 ENCOUNTER — Encounter: Payer: Self-pay | Admitting: Pharmacist

## 2016-02-09 NOTE — Progress Notes (Signed)
Completing chart review for specialty medication.  Patient is currently taking Humira for Crohn's Disease. Patient is managed by Kristie Cowman at [facility] for this.   Patient stopped methotrexate in November 2017 when it was held for a surgery and she did not have any worsening of disease while off of it.   Dosing:  40 mg every week.    Screening: TB test: completed prior to initiation  Hepatitis: completed prior to initiation  Monitoring: S/sx of infection: reported to GI that she has had a viral infection in November 2017 CBC: last one WNL (I cannot see the last labs but per GI were normal, next labs due in May 2018) S/sx of hypersensitivity: none reported S/sx of malignancy: none reported S/sx of heart failure: none reported  Patient tolerating Humira well and has continued regular follow up with gastroenterology. She has recently stopped methotrexate with no worsening of disease. No recommendations for any changes - will follow up with patient at next refill.

## 2016-02-12 MED FILL — ESTRADIOL-NORETH 1.0-0.5 MG: 1-0.5 | 84 days supply | Qty: 84 | Fill #2

## 2016-02-14 MED FILL — HUMIRA 40 MG/0.8ML PSKT: 40 | 28 days supply | Qty: 4 | Fill #2

## 2016-02-20 ENCOUNTER — Encounter: Payer: Self-pay | Admitting: Allergy

## 2016-02-21 DIAGNOSIS — F411 Generalized anxiety disorder: Secondary | ICD-10-CM | POA: Diagnosis not present

## 2016-02-26 ENCOUNTER — Other Ambulatory Visit: Payer: Self-pay | Admitting: Internal Medicine

## 2016-02-26 MED FILL — ESCITALOPRAM 10 MG TABLET: 10 | 90 days supply | Qty: 90 | Fill #0

## 2016-03-04 ENCOUNTER — Encounter: Payer: Self-pay | Admitting: Internal Medicine

## 2016-03-04 ENCOUNTER — Telehealth: Payer: Self-pay | Admitting: Internal Medicine

## 2016-03-04 ENCOUNTER — Ambulatory Visit (INDEPENDENT_AMBULATORY_CARE_PROVIDER_SITE_OTHER): Payer: 59 | Admitting: Internal Medicine

## 2016-03-04 VITALS — BP 112/70 | HR 72 | Temp 98.1°F | Ht 63.0 in | Wt 150.0 lb

## 2016-03-04 DIAGNOSIS — J209 Acute bronchitis, unspecified: Secondary | ICD-10-CM

## 2016-03-04 DIAGNOSIS — R05 Cough: Secondary | ICD-10-CM | POA: Diagnosis not present

## 2016-03-04 DIAGNOSIS — K5090 Crohn's disease, unspecified, without complications: Secondary | ICD-10-CM

## 2016-03-04 DIAGNOSIS — F419 Anxiety disorder, unspecified: Secondary | ICD-10-CM

## 2016-03-04 DIAGNOSIS — Z8701 Personal history of pneumonia (recurrent): Secondary | ICD-10-CM | POA: Diagnosis not present

## 2016-03-04 DIAGNOSIS — R059 Cough, unspecified: Secondary | ICD-10-CM

## 2016-03-04 MED ORDER — LEVOFLOXACIN 500 MG PO TABS: 500.0000 mg | ORAL_TABLET | Freq: Every day | ORAL | 0 refills | Status: DC

## 2016-03-04 MED ORDER — BUSPIRONE HCL 7.5 MG PO TABS: 7.5000 mg | ORAL_TABLET | Freq: Two times a day (BID) | ORAL | 0 refills | Status: DC

## 2016-03-04 MED ORDER — HYDROCODONE-HOMATROPINE 5-1.5 MG/5ML PO SYRP: 5.0000 mL | ORAL_SOLUTION | Freq: Three times a day (TID) | ORAL | 0 refills | Status: DC | PRN

## 2016-03-04 MED FILL — busPIRone HCL 7.5 MG TABS: 7.5 | 30 days supply | Qty: 60 | Fill #0

## 2016-03-04 MED FILL — levoFLOXacin 500 MG TABS: 500 | 10 days supply | Qty: 10 | Fill #0

## 2016-03-04 NOTE — Progress Notes (Signed)
   Subjective:    Patient ID: [name], female    DOB: 20-Jan-1960, 56 y.o.   MRN: [id number]  HPI 56 year old Female in today for cough which is been going on for about 3 weeks. No fever or shaking chills. No myalgias. Coughing a lot in the office today.  History of lingular and left lower lobe pneumonia 2016. Called today wanting refill of Hycodan but I wanted to examine her chest.  Also she seeing counselor recently who recommended BuSpar instead of Xanax.  Patient also has history of Crohn's disease bowel disease which is stable.  Has seen allergist, Dr. Nelva Bush and has Qvar and albuterol inhalers. She saw allergist in January after initial visit October 2017. Also under treatment for reflux. Thought to have asthma variant. Allergy skin tests were positive to oak, pecan tree, dust mite.    Review of Systems see above     Objective:   Physical Exam TMs are clear. Pharynx slightly injected. Neck supple. Chest clear. Lots of coughing in the office today.       Assessment & Plan:  Acute bronchitis  History of allergic rhinitis  Anxiety disorder  Plan: BuSpar 7.5 mg twice daily. Is to call a progress report in 2 weeks. Zithromax Z-PAK take as directed. Hycodan 1 teaspoon by mouth every 8 hours when necessary cough. Rest and drink plenty of fluids.

## 2016-03-04 NOTE — Telephone Encounter (Signed)
Patient called to request a refill on Hycodan that was given sometime back September or October.  Advised patient that if she is that sick and hasn't been seen for this cough, Dr. Renold Genta will want to see her.  And, due to the change of laws for prescribing narcotic medications, we need to see her prior to prescribing this medication for her.    Patient given appointment for today @ 3:45 p.m.

## 2016-03-04 NOTE — Patient Instructions (Signed)
Take Zithromax Z-PAK as directed. Rest and drink plenty of fluids. Call if not better in one week or sooner if worse. Take Hycodan sparingly for cough.  With regard to anxiety start BuSpar 7.5 mg twice a day and call if not significantly improved in 2 weeks for dose increase.

## 2016-03-05 DIAGNOSIS — F411 Generalized anxiety disorder: Secondary | ICD-10-CM | POA: Diagnosis not present

## 2016-03-05 MED FILL — HYDROCODONE-HOMATROPINE SYR: 5-1.5 | 8 days supply | Qty: 120 | Fill #0

## 2016-03-18 DIAGNOSIS — L089 Local infection of the skin and subcutaneous tissue, unspecified: Secondary | ICD-10-CM | POA: Diagnosis not present

## 2016-03-18 MED FILL — DOXYCYCLINE HYCLATE 100 MG: 100 | 14 days supply | Qty: 28 | Fill #0

## 2016-03-19 DIAGNOSIS — L089 Local infection of the skin and subcutaneous tissue, unspecified: Secondary | ICD-10-CM | POA: Diagnosis not present

## 2016-03-20 DIAGNOSIS — L089 Local infection of the skin and subcutaneous tissue, unspecified: Secondary | ICD-10-CM | POA: Diagnosis not present

## 2016-03-20 DIAGNOSIS — F411 Generalized anxiety disorder: Secondary | ICD-10-CM | POA: Diagnosis not present

## 2016-03-21 ENCOUNTER — Ambulatory Visit: Payer: 59 | Admitting: Allergy

## 2016-03-21 DIAGNOSIS — L089 Local infection of the skin and subcutaneous tissue, unspecified: Secondary | ICD-10-CM | POA: Diagnosis not present

## 2016-03-28 ENCOUNTER — Ambulatory Visit: Payer: 59 | Admitting: Allergy

## 2016-04-01 ENCOUNTER — Encounter: Payer: Self-pay | Admitting: Allergy

## 2016-04-01 ENCOUNTER — Ambulatory Visit (INDEPENDENT_AMBULATORY_CARE_PROVIDER_SITE_OTHER): Payer: 59 | Admitting: Allergy

## 2016-04-01 VITALS — BP 110/68 | HR 74 | Resp 16

## 2016-04-01 DIAGNOSIS — H1010 Acute atopic conjunctivitis, unspecified eye: Secondary | ICD-10-CM

## 2016-04-01 DIAGNOSIS — K219 Gastro-esophageal reflux disease without esophagitis: Secondary | ICD-10-CM | POA: Diagnosis not present

## 2016-04-01 DIAGNOSIS — J309 Allergic rhinitis, unspecified: Secondary | ICD-10-CM | POA: Diagnosis not present

## 2016-04-01 DIAGNOSIS — J45991 Cough variant asthma: Secondary | ICD-10-CM

## 2016-04-01 DIAGNOSIS — Z9114 Patient's other noncompliance with medication regimen: Secondary | ICD-10-CM | POA: Diagnosis not present

## 2016-04-01 NOTE — Patient Instructions (Signed)
Continue allergen avoidance measures  Resume use of Qvar 15mcg 2 puffs twice a day.     Use albuterol 2 puffs every 4-6 hours as needed for cough, wheeze, shortness of breath  Use allegra 180mg  daily  Use Dymista spray (sample provided) 1-2 sprays twice a day  While on Dymista hold Nasacort/Flonase and Astelin.  Once dymista is completed resume use Nasacort or Flonase 2 sprays each nostril daily.   Astelin nasal spray 2 sprays twice a day once your nasal congestion has improved  Continue Pepcid 20mg  twice a day  Follow-up 4-6 months

## 2016-04-01 NOTE — Progress Notes (Signed)
Follow-up Note  RE: CORLIS ANGELICA MRN: [id number] DOB: [birth year] Date of Office Visit: 04/01/2016   History of present illness: [name] is a 56 y.o. female presenting today for follow-up of allergic rhinoconjunctivitis, cough variant asthma and reflux.   She was last seen in office on 01/22/2016 by myself.   The last 2-3 weeks she reports she is doing better.   Prior to that she reports she had a lot of coughing, headaches and congestion.  She report she was treated with an antibiotic by her PCP.  She has not been taking her medications as prescribed as she says she moved her medicines around and has been forgetting to take them. This includes her nasal sprays as well as her Qvar.  She has been using Mucinex mostly at night which she reports helps her to be able to sleep with her CPAP.  She also has been taking allegra as well as sudafed in the mornings.    Review of systems: Review of Systems  Constitutional: Negative for chills, fever and malaise/fatigue.  HENT: Positive for congestion. Negative for ear discharge, ear pain, nosebleeds, sinus pain, sore throat and tinnitus.   Eyes: Negative for discharge and redness.  Respiratory: Positive for cough and shortness of breath. Negative for wheezing.   Cardiovascular: Negative for chest pain.  Gastrointestinal: Positive for heartburn. Negative for abdominal pain, diarrhea, nausea and vomiting.  Musculoskeletal: Negative for joint pain and myalgias.  Skin: Negative for itching and rash.  Neurological: Positive for headaches.    All other systems negative unless noted above in HPI  Past medical/social/surgical/family history have been reviewed and are unchanged unless specifically indicated below.  No changes  Medication List: Allergies as of 04/01/2016      Reactions   Chlorhexidine Gluconate Itching   Hydrocodone Itching      Medication List       Accurate as of 04/01/16 12:03 PM. Always use your most recent med  list.          Adalimumab 40 MG/0.8ML Pskt Commonly known as:  HUMIRA Inject 0.8 mLs (40 mg total) into the skin once a week.   ALPRAZolam 0.25 MG tablet Commonly known as:  XANAX Take 1 tablet (0.25 mg total) by mouth 2 (two) times daily as needed for anxiety.   azelastine 0.1 % nasal spray Commonly known as:  ASTELIN Place 2 sprays into both nostrils 2 (two) times daily.   beclomethasone 80 MCG/ACT inhaler Commonly known as:  QVAR Inhale 2 puffs into the lungs 2 (two) times daily.   busPIRone 7.5 MG tablet Commonly known as:  BUSPAR Take 1 tablet (7.5 mg total) by mouth 2 (two) times daily.   clotrimazole-betamethasone cream Commonly known as:  LOTRISONE Apply 1 application topically 2 (two) times daily.   doxycycline 100 MG tablet Commonly known as:  VIBRA-TABS Take 1 tablet (100 mg total) by mouth 2 (two) times daily.   escitalopram 10 MG tablet Commonly known as:  LEXAPRO TAKE 1 TABLET BY MOUTH ONCE DAILY   estradiol-norethindrone 1-0.5 MG tablet Commonly known as:  ACTIVELLA Take 1 tablet by mouth daily.   famotidine 20 MG tablet Commonly known as:  PEPCID Take 20 mg by mouth daily.   fluocinonide ointment 0.05 % Commonly known as:  LIDEX Apply 1 application topically daily as needed.   HYDROcodone-homatropine 5-1.5 MG/5ML syrup Commonly known as:  HYCODAN Take 5 mLs by mouth every 8 (eight) hours as needed for cough.   levofloxacin  500 MG tablet Commonly known as:  LEVAQUIN Take 1 tablet (500 mg total) by mouth daily.   MUCINEX 600 MG 12 hr tablet Generic drug:  guaiFENesin Take 600 mg by mouth every evening.   SUDAFED 30 MG tablet Generic drug:  pseudoephedrine Take 30 mg by mouth every morning.   VENTOLIN HFA 108 (90 Base) MCG/ACT inhaler Generic drug:  albuterol INHALE 2 PUFFS INTO THE LUNGS EVERY 6 HOURS AS NEEDED FOR WHEEZING OR SHORTNESS OF BREATH.       Known medication allergies: Allergies  Allergen Reactions  . Chlorhexidine  Gluconate Itching  . Hydrocodone Itching     Physical examination: Blood pressure 110/68, pulse 74, resp. rate 16, last menstrual period 01/10/2014, SpO2 96 %.  General: Alert, interactive, in no acute distress. HEENT: TMs pearly gray, turbinates moderately edematous with clear discharge, post-pharynx non erythematous. Neck: Supple without lymphadenopathy. Lungs: Clear to auscultation without wheezing, rhonchi or rales. {no increased work of breathing. CV: Normal S1, S2 without murmurs. Abdomen: Nondistended, nontender. Skin: Warm and dry, without lesions or rashes. Extremities:  No clubbing, cyanosis or edema. Neuro:   Grossly intact.  Diagnositics/Labs: None today  Assessment and plan:   Cough variant asthma - Resume use of Qvar 14mg 2 puffs twice a day. - Use albuterol 2 puffs every 4-6 hours as needed for cough, wheeze, shortness of breath  Allergic rhinoconjunctivitis - Continue allergen avoidance measures - Use allegra 1847mdaily - Trial Dymista spray (sample provided) 1-2 sprays twice a day    While on Dymista hold Nasacort/Flonase and Astelin.  Once Dymista is completed resume use Nasacort or Flonase 2 sprays each nostril daily.   Astelin nasal spray 2 sprays twice a day once your nasal congestion has improved  GERD - Continue Pepcid 2071mwice a day  Medication noncompliance Encouraged importance of being compliant with her medications to help maintain and manage her day-to-day symptoms.   Follow-up 4-6 months   I appreciate the opportunity to take part in Zurri's care. Please do not hesitate to contact me with questions.  Sincerely,   ShaPrudy FeelerD Allergy/Immunology [facility] [place]

## 2016-04-04 ENCOUNTER — Other Ambulatory Visit: Payer: Self-pay | Admitting: Internal Medicine

## 2016-04-04 MED FILL — busPIRone HCL 7.5 MG TABS: 7.5 | 90 days supply | Qty: 180 | Fill #0

## 2016-05-01 DIAGNOSIS — F411 Generalized anxiety disorder: Secondary | ICD-10-CM | POA: Diagnosis not present

## 2016-05-07 MED FILL — ESTRADIOL-NORETH 1.0-0.5 MG: 1-0.5 | 84 days supply | Qty: 84 | Fill #3

## 2016-05-10 MED FILL — DOXYCYCLINE HYCLATE 100 MG: 100 | 14 days supply | Qty: 28 | Fill #0

## 2016-05-15 DIAGNOSIS — F411 Generalized anxiety disorder: Secondary | ICD-10-CM | POA: Diagnosis not present

## 2016-05-24 DIAGNOSIS — B351 Tinea unguium: Secondary | ICD-10-CM | POA: Diagnosis not present

## 2016-05-24 DIAGNOSIS — L0109 Other impetigo: Secondary | ICD-10-CM | POA: Diagnosis not present

## 2016-05-24 DIAGNOSIS — L0889 Other specified local infections of the skin and subcutaneous tissue: Secondary | ICD-10-CM | POA: Diagnosis not present

## 2016-05-24 DIAGNOSIS — L738 Other specified follicular disorders: Secondary | ICD-10-CM | POA: Diagnosis not present

## 2016-05-24 DIAGNOSIS — L0101 Non-bullous impetigo: Secondary | ICD-10-CM | POA: Diagnosis not present

## 2016-05-24 DIAGNOSIS — L723 Sebaceous cyst: Secondary | ICD-10-CM | POA: Diagnosis not present

## 2016-05-24 DIAGNOSIS — L814 Other melanin hyperpigmentation: Secondary | ICD-10-CM | POA: Diagnosis not present

## 2016-05-24 MED FILL — FLUCONAZOLE 200 MG TABLET: 200 | 84 days supply | Qty: 12 | Fill #0

## 2016-05-24 MED FILL — DOXYCYCLINE HYCLATE 100 MG: 100 | 14 days supply | Qty: 28 | Fill #1

## 2016-05-27 MED FILL — ESCITALOPRAM 10 MG TABLET: 10 | 90 days supply | Qty: 90 | Fill #1

## 2016-06-05 ENCOUNTER — Other Ambulatory Visit: Payer: Self-pay | Admitting: Pharmacist

## 2016-06-05 MED ORDER — ADALIMUMAB 40 MG/0.8ML ~~LOC~~ PSKT: 40.0000 mg | PREFILLED_SYRINGE | SUBCUTANEOUS | 5 refills | Status: DC

## 2016-06-05 MED FILL — HUMIRA 40 MG/0.8ML PSKT: 40 | 28 days supply | Qty: 4 | Fill #0

## 2016-06-12 DIAGNOSIS — F411 Generalized anxiety disorder: Secondary | ICD-10-CM | POA: Diagnosis not present

## 2016-06-13 MED FILL — DOXYCYCLINE HYCLATE 100 MG: 100 | 14 days supply | Qty: 28 | Fill #0

## 2016-06-26 DIAGNOSIS — F411 Generalized anxiety disorder: Secondary | ICD-10-CM | POA: Diagnosis not present

## 2016-07-04 MED FILL — valACYclovir HCL 1 GM TABS: 1 | 3 days supply | Qty: 12 | Fill #0

## 2016-07-09 DIAGNOSIS — M2550 Pain in unspecified joint: Secondary | ICD-10-CM | POA: Diagnosis not present

## 2016-07-09 DIAGNOSIS — Z79899 Other long term (current) drug therapy: Secondary | ICD-10-CM | POA: Diagnosis not present

## 2016-07-09 DIAGNOSIS — K5010 Crohn's disease of large intestine without complications: Secondary | ICD-10-CM | POA: Diagnosis not present

## 2016-07-09 DIAGNOSIS — L989 Disorder of the skin and subcutaneous tissue, unspecified: Secondary | ICD-10-CM | POA: Diagnosis not present

## 2016-07-09 MED FILL — rifAMPin 300 MG CAPS: 300 | 30 days supply | Qty: 30 | Fill #0

## 2016-07-10 DIAGNOSIS — F411 Generalized anxiety disorder: Secondary | ICD-10-CM | POA: Diagnosis not present

## 2016-07-22 ENCOUNTER — Telehealth: Payer: Self-pay

## 2016-07-22 DIAGNOSIS — Z1239 Encounter for other screening for malignant neoplasm of breast: Secondary | ICD-10-CM

## 2016-07-22 NOTE — Telephone Encounter (Signed)
Order for mammogram placed and letter sent

## 2016-07-24 DIAGNOSIS — F411 Generalized anxiety disorder: Secondary | ICD-10-CM | POA: Diagnosis not present

## 2016-07-24 MED FILL — busPIRone HCL 7.5 MG TABS: 7.5 | 90 days supply | Qty: 180 | Fill #1

## 2016-07-25 MED FILL — ESTRADIOL-NORETH 1.0-0.5 MG: 1-0.5 | 84 days supply | Qty: 84 | Fill #4

## 2016-08-06 MED FILL — FLUCONAZOLE 200 MG TABLET: 200 | 84 days supply | Qty: 12 | Fill #1

## 2016-08-14 DIAGNOSIS — L739 Follicular disorder, unspecified: Secondary | ICD-10-CM | POA: Diagnosis not present

## 2016-08-14 MED FILL — CLINDAMYCIN PHOSP 1% LOTION: 1 | 30 days supply | Qty: 60 | Fill #0

## 2016-08-14 MED FILL — rifAMPin 300 MG CAPS: 300 | 30 days supply | Qty: 30 | Fill #0

## 2016-08-26 ENCOUNTER — Other Ambulatory Visit: Payer: Self-pay | Admitting: Internal Medicine

## 2016-08-26 MED FILL — ESCITALOPRAM 10 MG TABLET: 10 | 90 days supply | Qty: 90 | Fill #0

## 2016-08-27 DIAGNOSIS — Z01419 Encounter for gynecological examination (general) (routine) without abnormal findings: Secondary | ICD-10-CM | POA: Diagnosis not present

## 2016-08-27 DIAGNOSIS — Z1231 Encounter for screening mammogram for malignant neoplasm of breast: Secondary | ICD-10-CM | POA: Diagnosis not present

## 2016-08-27 DIAGNOSIS — Z6826 Body mass index (BMI) 26.0-26.9, adult: Secondary | ICD-10-CM | POA: Diagnosis not present

## 2016-09-04 DIAGNOSIS — F411 Generalized anxiety disorder: Secondary | ICD-10-CM | POA: Diagnosis not present

## 2016-09-05 MED FILL — HUMIRA 40 MG/0.8ML PSKT: 40 | 28 days supply | Qty: 4 | Fill #1

## 2016-09-19 DIAGNOSIS — F411 Generalized anxiety disorder: Secondary | ICD-10-CM | POA: Diagnosis not present

## 2016-09-25 DIAGNOSIS — L739 Follicular disorder, unspecified: Secondary | ICD-10-CM | POA: Diagnosis not present

## 2016-09-26 MED FILL — FLUOCINONIDE 0.05 % OINT: 0.05 | 30 days supply | Qty: 60 | Fill #0

## 2016-09-26 MED FILL — CLINDAMYCIN PHOSPHATE 1% FO: 1 | 30 days supply | Qty: 50 | Fill #0

## 2016-09-26 MED FILL — FLUOCINONIDE 0.05% SOLUTION: 0.05 | 30 days supply | Qty: 60 | Fill #0

## 2016-10-03 DIAGNOSIS — Z13220 Encounter for screening for lipoid disorders: Secondary | ICD-10-CM | POA: Diagnosis not present

## 2016-10-03 DIAGNOSIS — Z1329 Encounter for screening for other suspected endocrine disorder: Secondary | ICD-10-CM | POA: Diagnosis not present

## 2016-10-03 DIAGNOSIS — Z131 Encounter for screening for diabetes mellitus: Secondary | ICD-10-CM | POA: Diagnosis not present

## 2016-10-03 DIAGNOSIS — F411 Generalized anxiety disorder: Secondary | ICD-10-CM | POA: Diagnosis not present

## 2016-10-17 DIAGNOSIS — F411 Generalized anxiety disorder: Secondary | ICD-10-CM | POA: Diagnosis not present

## 2016-10-22 MED FILL — ESTRADIOL-NORETH 1.0-0.5 MG: 1-0.5 | 84 days supply | Qty: 84 | Fill #0

## 2016-11-01 ENCOUNTER — Other Ambulatory Visit: Payer: Self-pay | Admitting: Internal Medicine

## 2016-11-01 DIAGNOSIS — F411 Generalized anxiety disorder: Secondary | ICD-10-CM | POA: Diagnosis not present

## 2016-11-01 MED FILL — busPIRone HCL 7.5 MG TABS: 7.5 | 90 days supply | Qty: 180 | Fill #0

## 2016-11-14 DIAGNOSIS — F411 Generalized anxiety disorder: Secondary | ICD-10-CM | POA: Diagnosis not present

## 2016-11-19 MED FILL — ESCITALOPRAM 10 MG TABLET: 10 | 90 days supply | Qty: 90 | Fill #1

## 2016-11-27 DIAGNOSIS — F411 Generalized anxiety disorder: Secondary | ICD-10-CM | POA: Diagnosis not present

## 2016-12-10 MED FILL — HUMIRA 40 MG/0.8ML PSKT: 40 | 28 days supply | Qty: 4 | Fill #2

## 2016-12-13 DIAGNOSIS — H40013 Open angle with borderline findings, low risk, bilateral: Secondary | ICD-10-CM | POA: Diagnosis not present

## 2016-12-13 DIAGNOSIS — H2513 Age-related nuclear cataract, bilateral: Secondary | ICD-10-CM | POA: Diagnosis not present

## 2016-12-13 DIAGNOSIS — H10413 Chronic giant papillary conjunctivitis, bilateral: Secondary | ICD-10-CM | POA: Diagnosis not present

## 2016-12-26 DIAGNOSIS — F411 Generalized anxiety disorder: Secondary | ICD-10-CM | POA: Diagnosis not present

## 2017-01-06 MED FILL — ESTRADIOL-NORETH 1.0-0.5 MG: 1-0.5 | 84 days supply | Qty: 84 | Fill #1

## 2017-01-10 DIAGNOSIS — H2513 Age-related nuclear cataract, bilateral: Secondary | ICD-10-CM | POA: Diagnosis not present

## 2017-01-10 DIAGNOSIS — H10413 Chronic giant papillary conjunctivitis, bilateral: Secondary | ICD-10-CM | POA: Diagnosis not present

## 2017-01-10 DIAGNOSIS — H40013 Open angle with borderline findings, low risk, bilateral: Secondary | ICD-10-CM | POA: Diagnosis not present

## 2017-01-10 MED FILL — HUMIRA 40 MG/0.8ML PSKT: 40 | 28 days supply | Qty: 4 | Fill #3

## 2017-01-15 ENCOUNTER — Telehealth: Payer: Self-pay | Admitting: Allergy

## 2017-01-15 NOTE — Telephone Encounter (Signed)
I think it would be best if she can been seen to determine symptoms and if she needs antibiotics or steroids or other management.  If she can come tomorrow please put her in on of those slots Dr. Ernst Bowler has.    In the meantime recommend she use her albuterol 2 puffs every 4 hours while awake for next several days.  She can also increase her Qvar to 2 puffs 3 times a day as well.

## 2017-01-15 NOTE — Telephone Encounter (Signed)
Please advise 

## 2017-01-15 NOTE — Telephone Encounter (Signed)
Patient thinks she has bronchitis. She was coughing a lot. She wants to know if something can be called into [facility], or if she needs to make an appt. Dr. Nelva Bush doesn't have anything for awhile. Dr. Ernst Bowler has 2 appointment tomorrow. She would like a call back to advise her on what needs to be done.

## 2017-01-15 NOTE — Telephone Encounter (Signed)
Spoke with Pt. Advised pt to increase Qvar to 2 puffs 3 times a day and to use her albuterol 2 puffs every 4 hours. Pt stated she would like to be seen tomorrow. Appointment scheduled.

## 2017-01-16 ENCOUNTER — Encounter: Payer: Self-pay | Admitting: Family Medicine

## 2017-01-16 ENCOUNTER — Ambulatory Visit (INDEPENDENT_AMBULATORY_CARE_PROVIDER_SITE_OTHER): Payer: 59 | Admitting: Family Medicine

## 2017-01-16 VITALS — BP 110/75 | HR 76 | Temp 98.7°F | Resp 16

## 2017-01-16 DIAGNOSIS — Z9114 Patient's other noncompliance with medication regimen: Secondary | ICD-10-CM

## 2017-01-16 DIAGNOSIS — J45991 Cough variant asthma: Secondary | ICD-10-CM | POA: Diagnosis not present

## 2017-01-16 DIAGNOSIS — J309 Allergic rhinitis, unspecified: Secondary | ICD-10-CM

## 2017-01-16 DIAGNOSIS — K219 Gastro-esophageal reflux disease without esophagitis: Secondary | ICD-10-CM

## 2017-01-16 DIAGNOSIS — H1010 Acute atopic conjunctivitis, unspecified eye: Secondary | ICD-10-CM | POA: Diagnosis not present

## 2017-01-16 DIAGNOSIS — Z91148 Patient's other noncompliance with medication regimen for other reason: Secondary | ICD-10-CM

## 2017-01-16 MED ORDER — IPRATROPIUM BROMIDE 0.03 % NA SOLN: 2.0000 | Freq: Two times a day (BID) | NASAL | 12 refills | Status: DC

## 2017-01-16 MED ORDER — BENZONATATE 100 MG PO CAPS
100.0000 mg | ORAL_CAPSULE | Freq: Three times a day (TID) | ORAL | 1 refills | Status: DC | PRN
Start: 2017-01-16 — End: 2017-07-11

## 2017-01-16 MED ORDER — ALBUTEROL SULFATE HFA 108 (90 BASE) MCG/ACT IN AERS: 2.0000 | INHALATION_SPRAY | RESPIRATORY_TRACT | 3 refills | Status: DC | PRN

## 2017-01-16 MED ORDER — FLUTICASONE PROPIONATE HFA 110 MCG/ACT IN AERO: 2.0000 | INHALATION_SPRAY | Freq: Two times a day (BID) | RESPIRATORY_TRACT | 5 refills | Status: DC

## 2017-01-16 MED ORDER — GUAIFENESIN ER 600 MG PO TB12: 600.0000 mg | ORAL_TABLET | Freq: Every evening | ORAL | 1 refills | Status: DC

## 2017-01-16 MED FILL — VENTOLIN HFA 90 MCG INHALER: 108 (90 BAS | 17 days supply | Qty: 18 | Fill #0

## 2017-01-16 MED FILL — BENZONATATE 100 MG CAPS: 100 | 30 days supply | Qty: 90 | Fill #0

## 2017-01-16 MED FILL — LUMIGAN 0.01% EYE DROPS: 0.01 | 30 days supply | Qty: 3 | Fill #0

## 2017-01-16 MED FILL — IPRATROPIUM 0.03% SPRAY: 0.03 | 30 days supply | Qty: 30 | Fill #0

## 2017-01-16 MED FILL — FLOVENT HFA 110 MCG INHALER: 110 | 30 days supply | Qty: 12 | Fill #0

## 2017-01-16 NOTE — Progress Notes (Addendum)
[place] [address] Dept: [phone]  FAMILY NURSE PRACTITIONER FOLLOW UP NOTE  Patient ID: [name], female    DOB: Mar 03, 1960  Age: 57 y.o. MRN: [id number] Date of Office Visit: 01/16/2017  Assessment  Chief Complaint: Shortness of Breath; Cough; Headache; and Nasal Congestion  HPI [name] is a 57 year old female patient who presents to the clinic today for a sick visit. She was last seen in this office on 04/01/2016 by Dr. Nelva Bush for evaluation of allergic rhinoconjunctivitis, cough variant asthma, and reflux. Prior to that visit, she had been treated by her primary care doctor with an antibiotic, however, she had not been taking any of her medications for asthma. At that time, she was prescribed Qvar, albuterol, Pepcid, and Dymista to gain better control of her airway inflammation.   At today's visit, she reports she began to have cold symptoms that began around the last week of November including nasal congestion, headache, watery eyes, and dry cough that resolved in about one week with the exception of a dry cough. On Saturday, 6 days ago, she started to have shortness of breath, wheezing, and her dry cough continued. On Saturday, she started using a Qvar inhaler that she has had at her home for about 1 year and albuterol every 4 hours as needed. She is reporting thick post nasal drip as well as rhinitis. She has not taken any nasal steroids or nasal rinses. She has taken Allegra on an as needed basis. She is reporting occasional heartburn which is worse after eating spicy food or beef for which she takes Pepcid 20 mg as needed. She denies fever and sick contacts.    Drug Allergies:  Allergies  Allergen Reactions  . Chlorhexidine Gluconate Itching  . Hydrocodone Itching    Physical Exam: BP 110/75 (BP Location: Right Arm, Cuff Size: Normal)   Pulse 76   Temp 98.7 F (37.1 C) (Oral)   Resp 16   LMP 01/10/2014   SpO2 97%    Physical  Exam  Constitutional: She is oriented to person, place, and time. She appears well-developed and well-nourished.  HENT:  Right Ear: External ear normal.  Left Ear: External ear normal.  Bilateral nares slightly erythematous and edematous. Ears normal. Eyes normal. Pharynx slightly erythematous and edematous with cobblestoning appearance and thick mucus noted.  Eyes: Conjunctivae are normal.  Neck: Normal range of motion. Neck supple.  Cardiovascular: Normal rate, regular rhythm and normal heart sounds.  S1-S2 normal. Regular heart rate and rhythm. No murmur noted.  Pulmonary/Chest: Effort normal and breath sounds normal.  Lungs clear to auscultation. Continuous dry cough noted  Musculoskeletal: Normal range of motion.  Neurological: She is alert and oriented to person, place, and time.  Skin: Skin is warm and dry.  Psychiatric: She has a normal mood and affect. Her behavior is normal.    Diagnostics: FVC 2.6, FEV1 2.36. Predicted FVC 3.29, predicted FEV1 2.57. Spirometry is within the normal range.   Assessment and Plan: 1. Allergic rhinoconjunctivitis   2. Gastroesophageal reflux disease, esophagitis presence not specified   3. Cough variant asthma   4. Noncompliance with medication treatment due to intermittent use of medication     Meds ordered this encounter  Medications  . benzonatate (TESSALON PERLES) 100 MG capsule    Sig: Take 1 capsule (100 mg total) by mouth 3 (three) times daily as needed for cough.    Dispense:  90 capsule    Refill:  1  .  fluticasone (FLOVENT HFA) 110 MCG/ACT inhaler    Sig: Inhale 2 puffs into the lungs 2 (two) times daily.    Dispense:  1 Inhaler    Refill:  5  . ipratropium (ATROVENT) 0.03 % nasal spray    Sig: Place 2 sprays into both nostrils 2 (two) times daily.    Dispense:  30 mL    Refill:  12  . albuterol (PROVENTIL HFA;VENTOLIN HFA) 108 (90 Base) MCG/ACT inhaler    Sig: Inhale 2 puffs into the lungs every 4 (four) hours as needed for  wheezing or shortness of breath (Ane cough).    Dispense:  1 Inhaler    Refill:  3  . guaiFENesin (MUCINEX) 600 MG 12 hr tablet    Sig: Take 1 tablet (600 mg total) by mouth every evening.    Dispense:  30 tablet    Refill:  1    Patient Instructions  Cough variant asthma - Begin Flovent 110 two puffs twice a day with a spacer - Continue albuterol 2 puffs every 4 hours as needed. - Tessalon Perles 100 mg up to 3 times a day as needed for cough Asthma control goals:   Full participation in all desired activities (may need albuterol before activity)  Albuterol use two time or less a week on average (not counting use with activity)  Cough interfering with sleep two time or less a month  Oral steroids no more than once a year  No hospitalizations   Allergic rhinoconjunctivitis - Continue allergen avoidance measures - Begin Atrovent nasal spray 0.03% two sprays in each nostril twice a day as needed  - Continue Allegra 180 mg a day as needed  - Continue mucinex 600 mg twice a day as needed  Gastroesophageal reflux disease - Continue Pepcid 20 mg a day for the next 2 weeks. May increase to 40 mg once a day if cough or heartburn is not well controlled  - Decrease intake of spice foods for now  Return in about 2 months (around 03/16/2017), or if symptoms worsen or fail to improve.    Thank you for the opportunity to care for this patient.  Please do not hesitate to contact me with questions.  Gareth Morgan, FNP [facility] [place] [facility]     I reviewed the Nurse Practitioner's note and agree with the documented findings and plan of care. We briefly discussed the patient and developed a plan concurrently.   Salvatore Marvel, MD [facility] [place]

## 2017-01-16 NOTE — Patient Instructions (Addendum)
Cough variant asthma - Begin Flovent 110 two puffs twice a day with a spacer - Continue albuterol 2 puffs every 4 hours as needed. - Tessalon Perles 100 mg up to 3 times a day as needed for cough Asthma control goals:   Full participation in all desired activities (may need albuterol before activity)  Albuterol use two time or less a week on average (not counting use with activity)  Cough interfering with sleep two time or less a month  Oral steroids no more than once a year  No hospitalizations   Allergic rhinoconjunctivitis - Continue allergen avoidance measures - Begin Atrovent nasal spray 0.03% two sprays in each nostril twice a day as needed  - Continue Allegra 180 mg a day as needed  - Continue mucinex 600 mg twice a day as needed  Gastroesophageal reflux disease - Continue Pepcid 20 mg a day for the next 2 weeks. May increase to 40 mg once a day if cough or heartburn is not well controlled  - Decrease intake of spice foods for now  Follow up in 2 months

## 2017-01-17 DIAGNOSIS — F411 Generalized anxiety disorder: Secondary | ICD-10-CM | POA: Diagnosis not present

## 2017-01-23 DIAGNOSIS — F411 Generalized anxiety disorder: Secondary | ICD-10-CM | POA: Diagnosis not present

## 2017-01-30 DIAGNOSIS — D485 Neoplasm of uncertain behavior of skin: Secondary | ICD-10-CM | POA: Diagnosis not present

## 2017-01-30 DIAGNOSIS — L738 Other specified follicular disorders: Secondary | ICD-10-CM | POA: Diagnosis not present

## 2017-01-30 DIAGNOSIS — L820 Inflamed seborrheic keratosis: Secondary | ICD-10-CM | POA: Diagnosis not present

## 2017-01-30 DIAGNOSIS — L723 Sebaceous cyst: Secondary | ICD-10-CM | POA: Diagnosis not present

## 2017-01-30 DIAGNOSIS — L603 Nail dystrophy: Secondary | ICD-10-CM | POA: Diagnosis not present

## 2017-01-30 DIAGNOSIS — I788 Other diseases of capillaries: Secondary | ICD-10-CM | POA: Diagnosis not present

## 2017-02-07 DIAGNOSIS — F411 Generalized anxiety disorder: Secondary | ICD-10-CM | POA: Diagnosis not present

## 2017-02-14 DIAGNOSIS — L720 Epidermal cyst: Secondary | ICD-10-CM | POA: Diagnosis not present

## 2017-02-14 DIAGNOSIS — L723 Sebaceous cyst: Secondary | ICD-10-CM | POA: Diagnosis not present

## 2017-02-19 DIAGNOSIS — M5137 Other intervertebral disc degeneration, lumbosacral region: Secondary | ICD-10-CM | POA: Diagnosis not present

## 2017-02-19 DIAGNOSIS — M50223 Other cervical disc displacement at C6-C7 level: Secondary | ICD-10-CM | POA: Diagnosis not present

## 2017-02-19 DIAGNOSIS — M5127 Other intervertebral disc displacement, lumbosacral region: Secondary | ICD-10-CM | POA: Diagnosis not present

## 2017-02-19 DIAGNOSIS — M791 Myalgia, unspecified site: Secondary | ICD-10-CM | POA: Diagnosis not present

## 2017-02-19 DIAGNOSIS — M545 Low back pain: Secondary | ICD-10-CM | POA: Diagnosis not present

## 2017-02-19 MED FILL — FLUCONAZOLE 200 MG TABLET: 200 | 84 days supply | Qty: 12 | Fill #0

## 2017-02-20 DIAGNOSIS — F411 Generalized anxiety disorder: Secondary | ICD-10-CM | POA: Diagnosis not present

## 2017-02-21 ENCOUNTER — Other Ambulatory Visit: Payer: Self-pay | Admitting: Internal Medicine

## 2017-02-21 MED FILL — busPIRone HCL 7.5 MG TABS: 7.5 | 90 days supply | Qty: 180 | Fill #1

## 2017-02-21 MED FILL — ESCITALOPRAM 10 MG TABLET: 10 | 90 days supply | Qty: 90 | Fill #0

## 2017-02-28 DIAGNOSIS — M50223 Other cervical disc displacement at C6-C7 level: Secondary | ICD-10-CM | POA: Diagnosis not present

## 2017-02-28 DIAGNOSIS — M5127 Other intervertebral disc displacement, lumbosacral region: Secondary | ICD-10-CM | POA: Diagnosis not present

## 2017-02-28 DIAGNOSIS — M791 Myalgia, unspecified site: Secondary | ICD-10-CM | POA: Diagnosis not present

## 2017-02-28 DIAGNOSIS — M545 Low back pain: Secondary | ICD-10-CM | POA: Diagnosis not present

## 2017-02-28 DIAGNOSIS — M5137 Other intervertebral disc degeneration, lumbosacral region: Secondary | ICD-10-CM | POA: Diagnosis not present

## 2017-03-04 DIAGNOSIS — F411 Generalized anxiety disorder: Secondary | ICD-10-CM | POA: Diagnosis not present

## 2017-03-11 DIAGNOSIS — F411 Generalized anxiety disorder: Secondary | ICD-10-CM | POA: Diagnosis not present

## 2017-03-18 MED FILL — LUMIGAN 0.01% EYE DROPS: 0.01 | 30 days supply | Qty: 3 | Fill #1

## 2017-03-27 DIAGNOSIS — H40013 Open angle with borderline findings, low risk, bilateral: Secondary | ICD-10-CM | POA: Diagnosis not present

## 2017-03-27 DIAGNOSIS — H10413 Chronic giant papillary conjunctivitis, bilateral: Secondary | ICD-10-CM | POA: Diagnosis not present

## 2017-03-27 DIAGNOSIS — H2513 Age-related nuclear cataract, bilateral: Secondary | ICD-10-CM | POA: Diagnosis not present

## 2017-04-03 DIAGNOSIS — F411 Generalized anxiety disorder: Secondary | ICD-10-CM | POA: Diagnosis not present

## 2017-04-07 MED FILL — ESTRADIOL-NORETH 1.0-0.5 MG: 1-0.5 | 28 days supply | Qty: 28 | Fill #2

## 2017-04-17 DIAGNOSIS — F411 Generalized anxiety disorder: Secondary | ICD-10-CM | POA: Diagnosis not present

## 2017-04-29 DIAGNOSIS — F411 Generalized anxiety disorder: Secondary | ICD-10-CM | POA: Diagnosis not present

## 2017-04-29 MED FILL — ESTRADIOL-NORETH 1.0-0.5 MG: 1-0.5 | 28 days supply | Qty: 28 | Fill #3

## 2017-05-05 ENCOUNTER — Other Ambulatory Visit: Payer: Self-pay | Admitting: Pharmacist

## 2017-05-05 MED ORDER — ADALIMUMAB 40 MG/0.8ML ~~LOC~~ PSKT: 40.0000 mg | PREFILLED_SYRINGE | SUBCUTANEOUS | 6 refills | Status: DC

## 2017-05-05 MED FILL — HUMIRA 40 MG/0.8ML PSKT: 40 | 28 days supply | Qty: 4 | Fill #4

## 2017-05-05 NOTE — Telephone Encounter (Signed)
Called patient to schedule an appointment for the [facility]. I was unable to reach the patient so I left a HIPAA-compliant message requesting that the patient return my call.   

## 2017-05-07 ENCOUNTER — Ambulatory Visit (INDEPENDENT_AMBULATORY_CARE_PROVIDER_SITE_OTHER): Payer: 59 | Admitting: Pharmacist

## 2017-05-07 DIAGNOSIS — Z79899 Other long term (current) drug therapy: Secondary | ICD-10-CM

## 2017-05-07 NOTE — Addendum Note (Signed)
Addended by: Rica Mast on: 05/07/2017 09:37 AM   Modules accepted: Orders

## 2017-05-07 NOTE — Progress Notes (Addendum)
S: Patient presents today to the [facility] for review of her specialty medication.  Patient is currently taking Humira for Crohn's Disease. Patient is managed by Kristie Cowman at [facility] for this.   Adherence: denies any missed doses of Humira   Dosing:  40 mg every week.   Screening: TB test: completed prior to initiation  Hepatitis: completed prior to initiation  Efficacy: reports that it is working very well for her and that she has not really had any side effects.   Monitoring: S/sx of infection: denies CBC: last one WNL S/sx of hypersensitivity: denies S/sx of malignancy: denies S/sx of heart failure: denies  Drug-specific monitoring: Adalimumab levels 5.6 Anti-Adalimumab Ab levels: <25   O:     Lab Results  Component Value Date   WBC 6.6 09/22/2014   HGB 13.2 09/22/2014   HCT 40.1 09/22/2014   MCV 92.2 09/22/2014   PLT 314 09/22/2014      Chemistry      Component Value Date/Time   NA 138 04/05/2010 0855   K 4.2 04/05/2010 0855   CL 103 04/05/2010 0855   CO2 23 04/05/2010 0855   BUN 15 04/05/2010 0855   CREATININE 0.74 04/05/2010 0855      Component Value Date/Time   CALCIUM 9.3 04/05/2010 0855   ALKPHOS 33 (L) 04/05/2010 0855   ALKPHOS 36 (L) 04/05/2010 0855   AST 19 04/05/2010 0855   ALT 14 04/05/2010 0855   BILITOT 0.5 04/05/2010 0855       A/P: 1. 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 All of her labs have been normal and she is regularly following with gastroenterologist at [facility] - last colonoscopy showed disease in remission. She did have skin lesions and they are unsure if they are secondary to Humira use or autoimmune disease but she is to continue the Humira for now. No recommendations for changes at this time.    Christella Hartigan, PharmD, BCPS, BCACP, CPP Clinical Pharmacist Practitioner  [phone]

## 2017-05-15 ENCOUNTER — Other Ambulatory Visit: Payer: Self-pay | Admitting: Internal Medicine

## 2017-05-15 DIAGNOSIS — F411 Generalized anxiety disorder: Secondary | ICD-10-CM | POA: Diagnosis not present

## 2017-05-15 MED FILL — LUMIGAN 0.01% EYE DROPS: 0.01 | 30 days supply | Qty: 3 | Fill #2

## 2017-05-16 MED FILL — busPIRone HCL 7.5 MG TABS: 7.5 | 90 days supply | Qty: 180 | Fill #0

## 2017-05-28 MED FILL — ESCITALOPRAM 10 MG TABLET: 10 | 90 days supply | Qty: 90 | Fill #1

## 2017-06-02 MED FILL — ESTRADIOL-NORETH 1.0-0.5 MG: 1-0.5 | 28 days supply | Qty: 28 | Fill #4

## 2017-06-12 DIAGNOSIS — F411 Generalized anxiety disorder: Secondary | ICD-10-CM | POA: Diagnosis not present

## 2017-06-20 ENCOUNTER — Other Ambulatory Visit: Payer: 59 | Admitting: Internal Medicine

## 2017-06-23 IMAGING — CR DG CHEST 2V
2 series · 2 of 2 positions shown · non-contrast
Comparison: PA and lateral chest x-ray dated June 28, 2004

CLINICAL DATA: Five days of nonproductive cough and low-grade
fever, recent exposure to family member with pneumonia

EXAM:
CHEST  2 VIEW

[w chest pa]
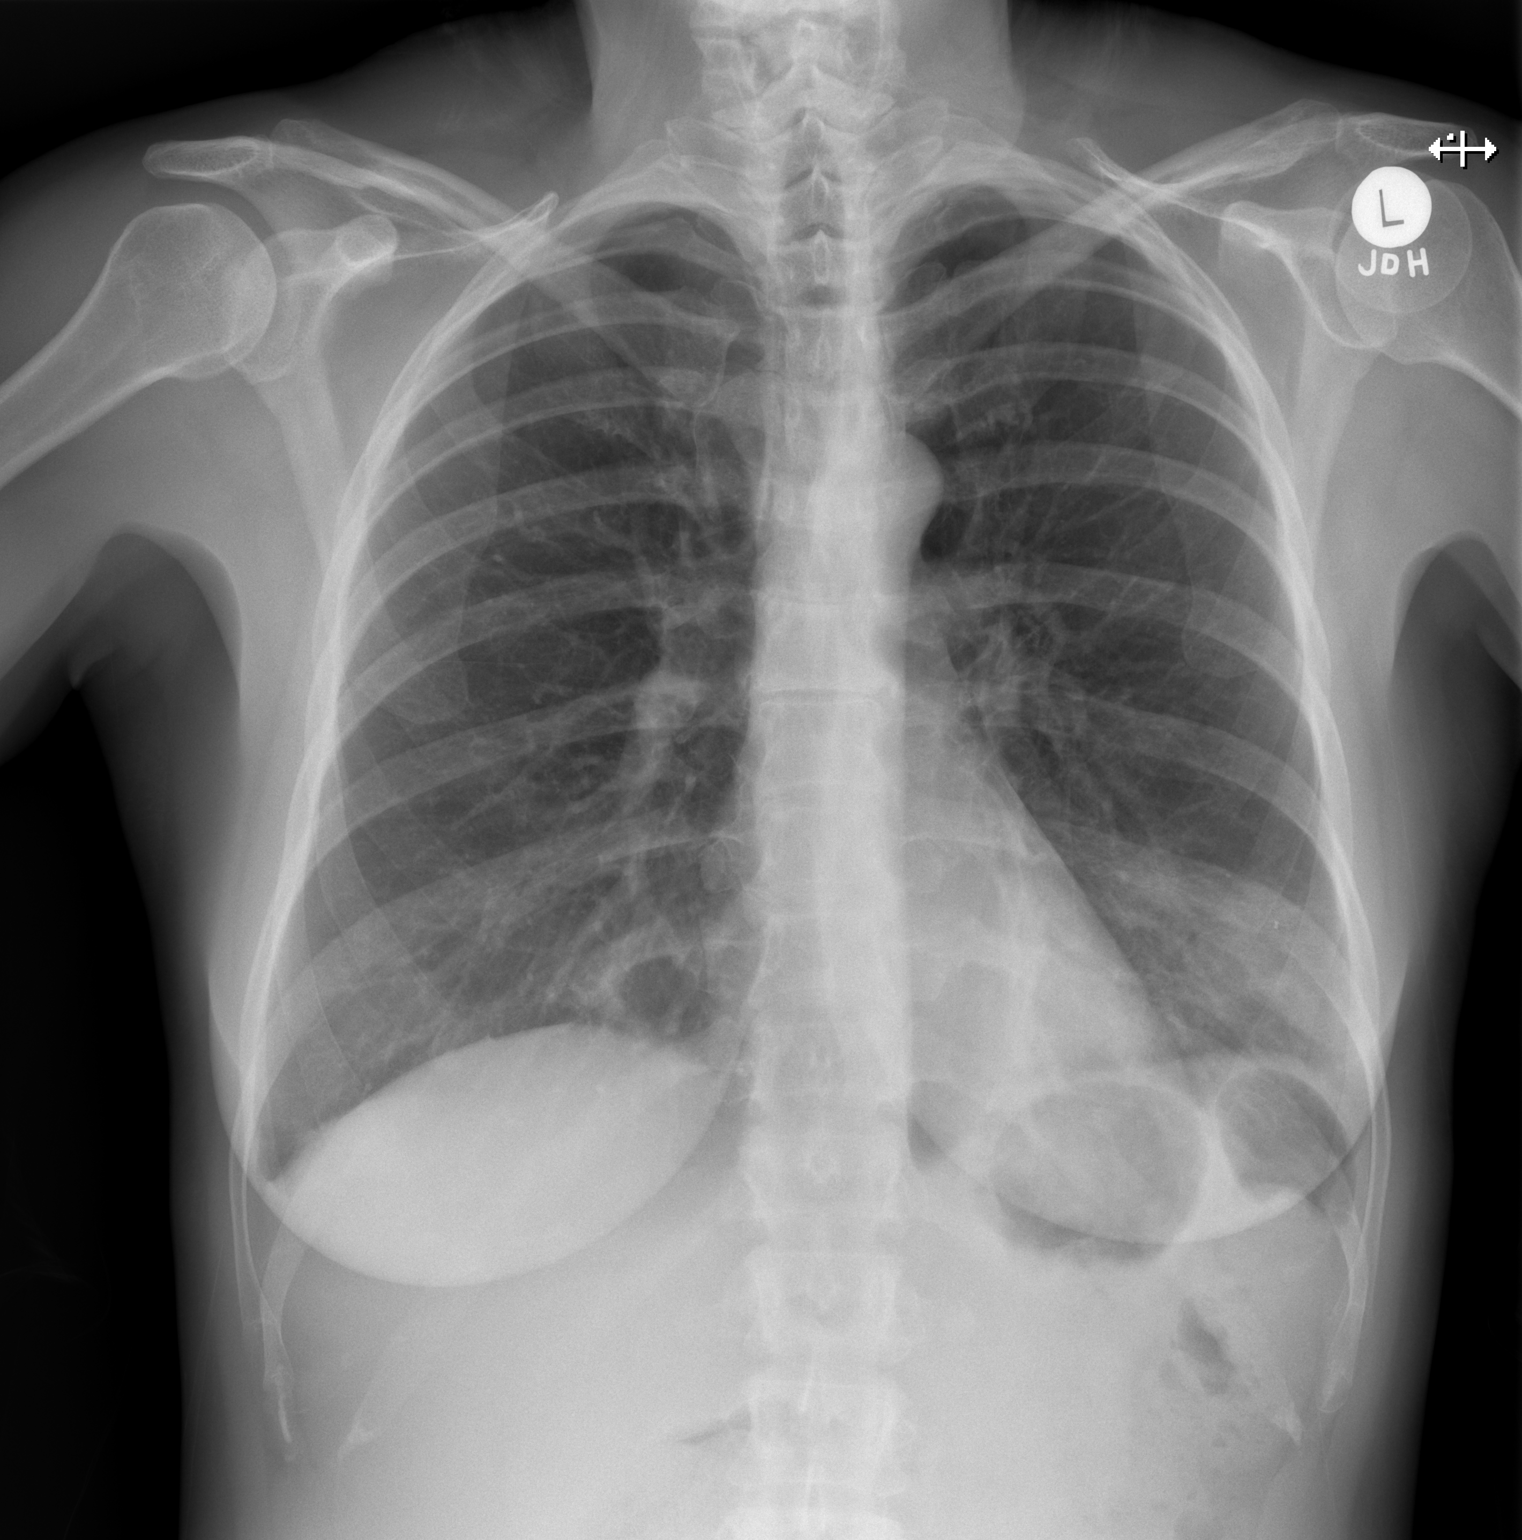

[w chest lat]
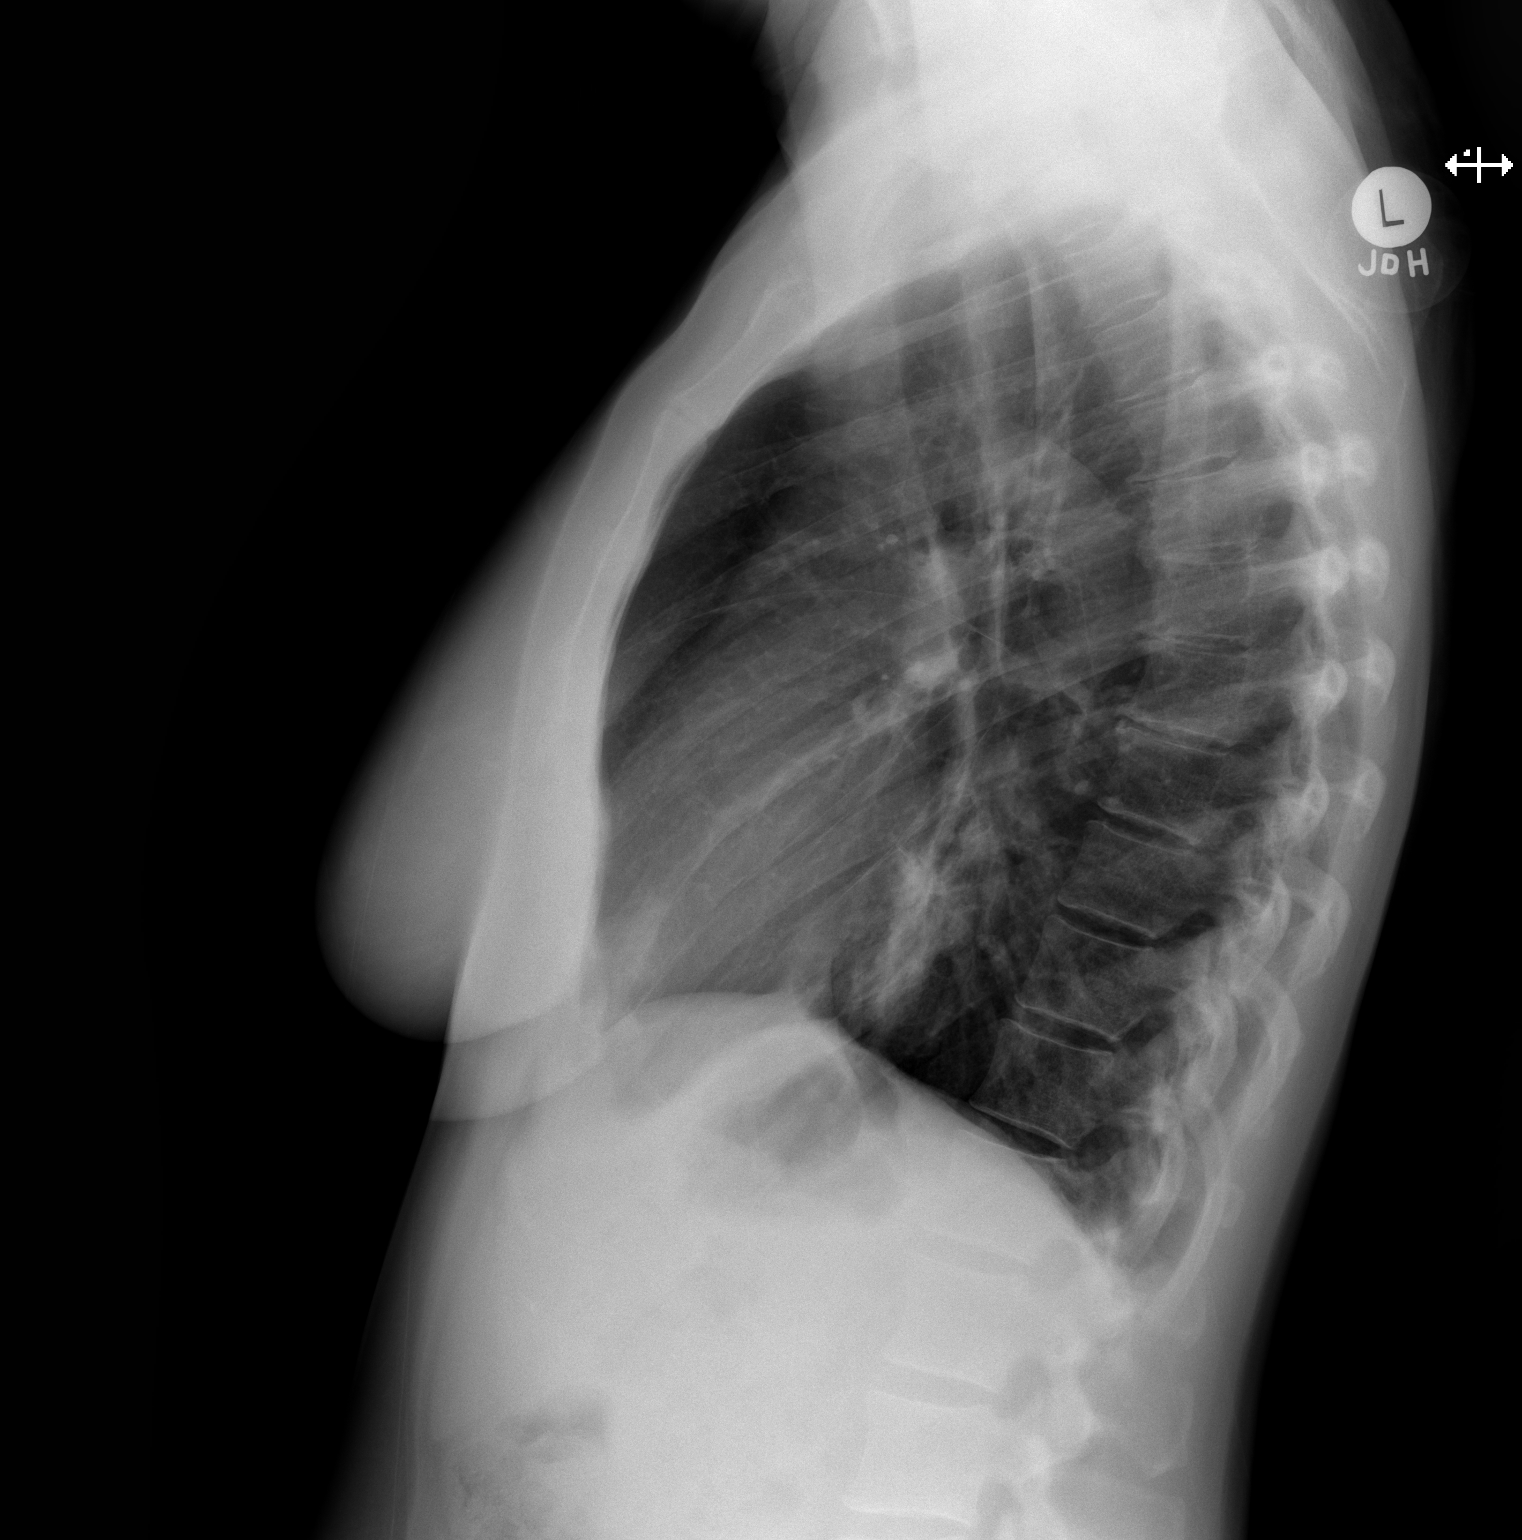

[2 of 2 positions shown; findings below may reference images not displayed]

FINDINGS: There is an infiltrate in the anterior aspect of the right middle
lobe. There is minimal lingular infiltrate is well. There is no
pleural effusion. The right lung is clear. The heart and pulmonary
vascularity are normal. The mediastinum is normal in width. The bony
thorax exhibits no acute abnormality.
IMPRESSION: Lingular and left lower lobe pneumonia. Followup PA and lateral
chest X-ray is recommended in 3-4 weeks following trial of
antibiotic therapy to ensure resolution and exclude underlying
malignancy.

## 2017-06-24 ENCOUNTER — Encounter: Payer: 59 | Admitting: Internal Medicine

## 2017-06-25 ENCOUNTER — Other Ambulatory Visit: Payer: Self-pay

## 2017-06-25 DIAGNOSIS — Z1321 Encounter for screening for nutritional disorder: Secondary | ICD-10-CM

## 2017-06-25 DIAGNOSIS — Z1329 Encounter for screening for other suspected endocrine disorder: Secondary | ICD-10-CM

## 2017-06-25 DIAGNOSIS — K50919 Crohn's disease, unspecified, with unspecified complications: Secondary | ICD-10-CM

## 2017-06-25 DIAGNOSIS — Z8639 Personal history of other endocrine, nutritional and metabolic disease: Secondary | ICD-10-CM

## 2017-06-25 DIAGNOSIS — Z0000 Encounter for general adult medical examination without abnormal findings: Secondary | ICD-10-CM

## 2017-06-26 DIAGNOSIS — F411 Generalized anxiety disorder: Secondary | ICD-10-CM | POA: Diagnosis not present

## 2017-06-26 MED FILL — ESTRADIOL-NORETH 1.0-0.5 MG: 1-0.5 | 28 days supply | Qty: 28 | Fill #5

## 2017-07-10 ENCOUNTER — Other Ambulatory Visit: Payer: 59 | Admitting: Internal Medicine

## 2017-07-10 DIAGNOSIS — K50919 Crohn's disease, unspecified, with unspecified complications: Secondary | ICD-10-CM | POA: Diagnosis not present

## 2017-07-10 DIAGNOSIS — Z1329 Encounter for screening for other suspected endocrine disorder: Secondary | ICD-10-CM

## 2017-07-10 DIAGNOSIS — Z8639 Personal history of other endocrine, nutritional and metabolic disease: Secondary | ICD-10-CM

## 2017-07-10 DIAGNOSIS — Z0000 Encounter for general adult medical examination without abnormal findings: Secondary | ICD-10-CM | POA: Diagnosis not present

## 2017-07-10 DIAGNOSIS — Z1321 Encounter for screening for nutritional disorder: Secondary | ICD-10-CM

## 2017-07-10 MED FILL — LUMIGAN 0.01% EYE DROPS: 0.01 | 30 days supply | Qty: 3 | Fill #3

## 2017-07-11 ENCOUNTER — Other Ambulatory Visit: Payer: 59 | Admitting: Internal Medicine

## 2017-07-11 ENCOUNTER — Ambulatory Visit (INDEPENDENT_AMBULATORY_CARE_PROVIDER_SITE_OTHER): Payer: 59 | Admitting: Internal Medicine

## 2017-07-11 ENCOUNTER — Encounter: Payer: Self-pay | Admitting: Internal Medicine

## 2017-07-11 VITALS — BP 110/70 | HR 72 | Ht 62.5 in | Wt 142.0 lb

## 2017-07-11 DIAGNOSIS — J309 Allergic rhinitis, unspecified: Secondary | ICD-10-CM

## 2017-07-11 DIAGNOSIS — F439 Reaction to severe stress, unspecified: Secondary | ICD-10-CM

## 2017-07-11 DIAGNOSIS — K50919 Crohn's disease, unspecified, with unspecified complications: Secondary | ICD-10-CM

## 2017-07-11 DIAGNOSIS — Z0000 Encounter for general adult medical examination without abnormal findings: Secondary | ICD-10-CM

## 2017-07-11 DIAGNOSIS — Z23 Encounter for immunization: Secondary | ICD-10-CM | POA: Diagnosis not present

## 2017-07-11 DIAGNOSIS — F411 Generalized anxiety disorder: Secondary | ICD-10-CM

## 2017-07-11 LAB — POCT URINALYSIS DIPSTICK
APPEARANCE: NORMAL
Bilirubin, UA: NEGATIVE
Glucose, UA: NEGATIVE
Ketones, UA: NEGATIVE
Leukocytes, UA: NEGATIVE
NITRITE UA: NEGATIVE
ODOR: NORMAL
PH UA: 6 (ref 5.0–8.0)
Protein, UA: NEGATIVE
Spec Grav, UA: 1.015 (ref 1.010–1.025)
UROBILINOGEN UA: 0.2 U/dL

## 2017-07-11 LAB — CBC WITH DIFFERENTIAL/PLATELET
Basophils Absolute: 83 cells/uL (ref 0–200)
Basophils Relative: 1.6 %
EOS PCT: 5 %
Eosinophils Absolute: 260 cells/uL (ref 15–500)
HEMATOCRIT: 38.1 % (ref 35.0–45.0)
Hemoglobin: 12.7 g/dL (ref 11.7–15.5)
LYMPHS ABS: 2678 {cells}/uL (ref 850–3900)
MCH: 30.8 pg (ref 27.0–33.0)
MCHC: 33.3 g/dL (ref 32.0–36.0)
MCV: 92.5 fL (ref 80.0–100.0)
MPV: 10.1 fL (ref 7.5–12.5)
Monocytes Relative: 8.5 %
NEUTROS PCT: 33.4 %
Neutro Abs: 1737 cells/uL (ref 1500–7800)
Platelets: 244 10*3/uL (ref 140–400)
RBC: 4.12 10*6/uL (ref 3.80–5.10)
RDW: 11.8 % (ref 11.0–15.0)
Total Lymphocyte: 51.5 %
WBC: 5.2 10*3/uL (ref 3.8–10.8)
WBCMIX: 442 {cells}/uL (ref 200–950)

## 2017-07-11 LAB — LIPID PANEL
Cholesterol: 207 mg/dL — ABNORMAL HIGH (ref <200)
HDL: 78 mg/dL (ref >50)
LDL Cholesterol (Calc): 115 mg/dL (calc) — ABNORMAL HIGH
Non-HDL Cholesterol (Calc): 129 mg/dL (calc) (ref <130)
Total CHOL/HDL Ratio: 2.7 (calc) (ref <5.0)
Triglycerides: 49 mg/dL (ref <150)

## 2017-07-11 LAB — COMPLETE METABOLIC PANEL WITH GFR
AG Ratio: 1.4 (calc) (ref 1.0–2.5)
ALBUMIN MSPROF: 4.2 g/dL (ref 3.6–5.1)
ALT: 16 U/L (ref 6–29)
AST: 21 U/L (ref 10–35)
Alkaline phosphatase (APISO): 29 U/L — ABNORMAL LOW (ref 33–130)
BILIRUBIN TOTAL: 0.7 mg/dL (ref 0.2–1.2)
BUN: 17 mg/dL (ref 7–25)
CALCIUM: 8.9 mg/dL (ref 8.6–10.4)
CHLORIDE: 104 mmol/L (ref 98–110)
CO2: 26 mmol/L (ref 20–32)
Creat: 0.72 mg/dL (ref 0.50–1.05)
GFR, Est African American: 108 mL/min/{1.73_m2} (ref >=60)
GFR, Est Non African American: 94 mL/min/{1.73_m2} (ref >=60)
GLUCOSE: 83 mg/dL (ref 65–99)
Globulin: 2.9 g/dL (calc) (ref 1.9–3.7)
Potassium: 4.3 mmol/L (ref 3.5–5.3)
Sodium: 135 mmol/L (ref 135–146)
TOTAL PROTEIN: 7.1 g/dL (ref 6.1–8.1)

## 2017-07-11 LAB — TSH: TSH: 1.32 m[IU]/L (ref 0.40–4.50)

## 2017-07-11 LAB — VITAMIN D 25 HYDROXY (VIT D DEFICIENCY, FRACTURES): VIT D 25 HYDROXY: 32 ng/mL (ref 30–100)

## 2017-07-11 MED ORDER — PREDNISONE 10 MG PO TABS: ORAL_TABLET | ORAL | 0 refills | Status: DC

## 2017-07-11 MED ORDER — ESCITALOPRAM OXALATE 10 MG PO TABS: 10.0000 mg | ORAL_TABLET | Freq: Every day | ORAL | 1 refills | Status: DC

## 2017-07-11 MED FILL — predniSONE 10 MG TABS: 10 | 6 days supply | Qty: 21 | Fill #0

## 2017-07-11 NOTE — Patient Instructions (Addendum)
Take Prednisone in tapering course as directed 6-5-4-3-2-1.  If not improved, see allergist for further treatment.  Tetanus immunization update given.  Okay to refill Lexapro and BuSpar.

## 2017-07-11 NOTE — Progress Notes (Signed)
Subjective:    Patient ID: [name], female    DOB: 1960/02/05, 57 y.o.   MRN: [id number]  HPI 57 year old Female for health maintenance exam and evaluation of medical issues.  She has a history of Crohn's disease and is followed at [facility] by Dr. Kristie Cowman and is on Humira..  She has a history of allergic rhinitis and chronic recurrent sinusitis.  She has had significant nasal congestion for a number of weeks.  She will be treated with short course of prednisone going from 60 mg to 0 mg over 7 days.  We talked about antibiotics but she prefers not to do this at the present time.  She may need to go back and see Dr. Nelva Bush, allergist.  She has a history of situational stress and anxiety with her sister who has alcohol addiction issues.  Patient is raising sister's son who is now 29 years old.  He has not been doing well in school.  Patient has a history of sleep apnea but has not been able to wear his CPAP device recently because of chronic nasal congestion.  She has been taking antihistamine orally and has been prescribed steroid inhalers in the past per allergist.  Most recently has been using Atrovent nasal spray and Flovent inhaler.  For anxiety and situational stress she is on Lexapro 10 mg daily and BuSpar.  Tetanus immunization update given today.  LDL is mildly elevated at 115 with a total cholesterol of 207.  She gets a lot of exercise playing tennis and watches her weight.  Social history: She is married.  2 sons.  She does not smoke.  Occasionally consumes beer.  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  Husband is a cardiologist.  Fractured left arm 1973.  Dog bite with sutures on face and left ear 1965.  Cesarean section January 01, 1989 in [place].  Vertigo 2002.  In 2011 she had left thumb incision and drainage of a mucoid cyst by Dr. Daylene Katayama.  History of Herpes simplex type I.  Family history: Stroke and history of alcoholism in mother and  sister.  Mother is now deceased.  Patient says she never knew her real father.  In November 2017 she had right index finger radial collateral ligament injury requiring surgery by Dr. Fredna Dow.  In September 2017 she had abscess of fourth finger requiring surgical drainage.  Culture grew Staph aureus sensitive to Bactrim.  Epidermoid cyst ( See Derm Path report) from back February 2019 at [facility]    Review of Systems no new complaints except for nasal congestion     Objective:   Physical Exam  Constitutional: She is oriented to person, place, and time. She appears well-developed and well-nourished. No distress.  HENT:  Head: Normocephalic and atraumatic.  Right Ear: External ear normal.  Left Ear: External ear normal.  Mouth/Throat: Oropharynx is clear and moist.  Eyes: Pupils are equal, round, and reactive to light. EOM are normal. Right eye exhibits no discharge. Left eye exhibits no discharge. No scleral icterus.  Neck: Neck supple. No JVD present. No thyromegaly present.  Cardiovascular: Normal rate, regular rhythm and normal heart sounds. Exam reveals no friction rub.  No murmur heard. Pulmonary/Chest: Effort normal and breath sounds normal. No stridor. No respiratory distress. She has no wheezes.  Abdominal: Soft. Bowel sounds are normal. She exhibits no distension and no mass. There is no tenderness. There is no guarding.  Genitourinary:  Genitourinary Comments: Deferred to GYN  Musculoskeletal: She exhibits no edema.  Lymphadenopathy:    She has no cervical adenopathy.  Neurological: She is alert and oriented to person, place, and time. She displays normal reflexes. No cranial nerve deficit. Coordination normal.  Skin: Skin is warm and dry. She is not diaphoretic.  Psychiatric: She has a normal mood and affect. Her behavior is normal. Judgment and thought content normal.  Vitals reviewed.         Assessment & Plan:  Mild elevation of LDL  cholesterol  Crohn's disease treated with Humira  Need for Tdap vaccine  Chronic rhinosinusitis/allergic rhinitis.  Take short course of prednisone and see if improves.  If not needs to be seen by allergist.  Situational stress and anxiety treated with Lexapro and BuSpar  Plan: Continue current medications and return in 1 year or as needed.

## 2017-07-15 ENCOUNTER — Encounter: Payer: 59 | Admitting: Internal Medicine

## 2017-07-24 MED FILL — ESTRADIOL-NORETH 1.0-0.5 MG: 1-0.5 | 28 days supply | Qty: 28 | Fill #6

## 2017-08-18 DIAGNOSIS — F411 Generalized anxiety disorder: Secondary | ICD-10-CM | POA: Diagnosis not present

## 2017-08-25 MED FILL — ESTRADIOL-NORETH 1.0-0.5 MG: 1-0.5 | 28 days supply | Qty: 28 | Fill #7

## 2017-08-25 MED FILL — ESCITALOPRAM 10 MG TABLET: 10 | 90 days supply | Qty: 90 | Fill #0

## 2017-08-25 MED FILL — HUMIRA 40 MG/0.8ML PSKT: 40 | 28 days supply | Qty: 4 | Fill #0

## 2017-08-25 MED FILL — busPIRone HCL 7.5 MG TABS: 7.5 | 90 days supply | Qty: 180 | Fill #1

## 2017-09-04 DIAGNOSIS — F411 Generalized anxiety disorder: Secondary | ICD-10-CM | POA: Diagnosis not present

## 2017-09-08 ENCOUNTER — Encounter: Payer: Self-pay | Admitting: Internal Medicine

## 2017-09-08 DIAGNOSIS — Z1231 Encounter for screening mammogram for malignant neoplasm of breast: Secondary | ICD-10-CM | POA: Diagnosis not present

## 2017-09-11 DIAGNOSIS — F411 Generalized anxiety disorder: Secondary | ICD-10-CM | POA: Diagnosis not present

## 2017-09-11 MED FILL — IPRATROPIUM 0.03% SPRAY: 0.03 | 30 days supply | Qty: 30 | Fill #1

## 2017-09-18 DIAGNOSIS — F411 Generalized anxiety disorder: Secondary | ICD-10-CM | POA: Diagnosis not present

## 2017-09-23 MED FILL — FLUCONAZOLE 200 MG TABLET: 200 | 27 days supply | Qty: 4 | Fill #1

## 2017-09-25 DIAGNOSIS — F411 Generalized anxiety disorder: Secondary | ICD-10-CM | POA: Diagnosis not present

## 2017-09-29 DIAGNOSIS — Z01419 Encounter for gynecological examination (general) (routine) without abnormal findings: Secondary | ICD-10-CM | POA: Diagnosis not present

## 2017-09-29 DIAGNOSIS — Z6825 Body mass index (BMI) 25.0-25.9, adult: Secondary | ICD-10-CM | POA: Diagnosis not present

## 2017-09-29 MED FILL — ESTRADIOL-NORETH 1.0-0.5 MG: 1-0.5 | 84 days supply | Qty: 84 | Fill #0

## 2017-10-02 DIAGNOSIS — F411 Generalized anxiety disorder: Secondary | ICD-10-CM | POA: Diagnosis not present

## 2017-10-07 DIAGNOSIS — H10413 Chronic giant papillary conjunctivitis, bilateral: Secondary | ICD-10-CM | POA: Diagnosis not present

## 2017-10-07 DIAGNOSIS — H2513 Age-related nuclear cataract, bilateral: Secondary | ICD-10-CM | POA: Diagnosis not present

## 2017-10-07 DIAGNOSIS — H40013 Open angle with borderline findings, low risk, bilateral: Secondary | ICD-10-CM | POA: Diagnosis not present

## 2017-10-09 DIAGNOSIS — F411 Generalized anxiety disorder: Secondary | ICD-10-CM | POA: Diagnosis not present

## 2017-10-14 DIAGNOSIS — Z23 Encounter for immunization: Secondary | ICD-10-CM | POA: Diagnosis not present

## 2017-10-14 DIAGNOSIS — K5010 Crohn's disease of large intestine without complications: Secondary | ICD-10-CM | POA: Diagnosis not present

## 2017-10-14 DIAGNOSIS — K5090 Crohn's disease, unspecified, without complications: Secondary | ICD-10-CM | POA: Diagnosis not present

## 2017-10-16 DIAGNOSIS — F411 Generalized anxiety disorder: Secondary | ICD-10-CM | POA: Diagnosis not present

## 2017-10-17 MED FILL — GAVILYTE-G SOLUTION: 236 | 1 days supply | Qty: 4000 | Fill #0

## 2017-10-23 MED FILL — LUMIGAN 0.01% EYE DROPS: 0.01 | 30 days supply | Qty: 3 | Fill #4

## 2017-10-27 DIAGNOSIS — K5010 Crohn's disease of large intestine without complications: Secondary | ICD-10-CM | POA: Diagnosis not present

## 2017-10-30 MED FILL — HUMIRA 40 MG/0.8ML PSKT: 40 | 28 days supply | Qty: 4 | Fill #1

## 2017-11-07 ENCOUNTER — Encounter: Payer: Self-pay | Admitting: Internal Medicine

## 2017-11-07 DIAGNOSIS — K635 Polyp of colon: Secondary | ICD-10-CM | POA: Diagnosis not present

## 2017-11-07 DIAGNOSIS — K5140 Inflammatory polyps of colon without complications: Secondary | ICD-10-CM | POA: Diagnosis not present

## 2017-11-07 DIAGNOSIS — Z888 Allergy status to other drugs, medicaments and biological substances status: Secondary | ICD-10-CM | POA: Diagnosis not present

## 2017-11-07 DIAGNOSIS — D123 Benign neoplasm of transverse colon: Secondary | ICD-10-CM | POA: Diagnosis not present

## 2017-11-07 DIAGNOSIS — K50118 Crohn's disease of large intestine with other complication: Secondary | ICD-10-CM | POA: Diagnosis not present

## 2017-11-07 DIAGNOSIS — D125 Benign neoplasm of sigmoid colon: Secondary | ICD-10-CM | POA: Diagnosis not present

## 2017-11-07 DIAGNOSIS — M1990 Unspecified osteoarthritis, unspecified site: Secondary | ICD-10-CM | POA: Diagnosis not present

## 2017-11-07 DIAGNOSIS — Z885 Allergy status to narcotic agent status: Secondary | ICD-10-CM | POA: Diagnosis not present

## 2017-11-07 DIAGNOSIS — G4730 Sleep apnea, unspecified: Secondary | ICD-10-CM | POA: Diagnosis not present

## 2017-11-07 DIAGNOSIS — Z9989 Dependence on other enabling machines and devices: Secondary | ICD-10-CM | POA: Diagnosis not present

## 2017-11-07 DIAGNOSIS — K5010 Crohn's disease of large intestine without complications: Secondary | ICD-10-CM | POA: Diagnosis not present

## 2017-11-07 DIAGNOSIS — K5730 Diverticulosis of large intestine without perforation or abscess without bleeding: Secondary | ICD-10-CM | POA: Diagnosis not present

## 2017-11-07 DIAGNOSIS — K6389 Other specified diseases of intestine: Secondary | ICD-10-CM | POA: Diagnosis not present

## 2017-11-07 MED FILL — COLESTIPOL HCL 1 GM TABLET: 1 | 30 days supply | Qty: 180 | Fill #0

## 2017-11-12 DIAGNOSIS — F411 Generalized anxiety disorder: Secondary | ICD-10-CM | POA: Diagnosis not present

## 2017-11-13 MED FILL — CELECOXIB 100 MG CAP: 100 | 30 days supply | Qty: 60 | Fill #0

## 2017-11-26 ENCOUNTER — Other Ambulatory Visit: Payer: Self-pay | Admitting: Internal Medicine

## 2017-11-26 MED FILL — ESCITALOPRAM 10 MG TABLET: 10 | 90 days supply | Qty: 90 | Fill #1

## 2017-11-26 MED FILL — busPIRone HCL 7.5 MG TABS: 7.5 | 90 days supply | Qty: 180 | Fill #0

## 2017-11-27 DIAGNOSIS — F411 Generalized anxiety disorder: Secondary | ICD-10-CM | POA: Diagnosis not present

## 2017-12-24 MED FILL — ESTRADIOL-NORETHINDRONE ACE: 1-0.5 | 84 days supply | Qty: 84 | Fill #1

## 2017-12-26 DIAGNOSIS — F411 Generalized anxiety disorder: Secondary | ICD-10-CM | POA: Diagnosis not present

## 2018-01-01 MED FILL — COLESTIPOL HCL 1 GM TABLET: 1 | 30 days supply | Qty: 180 | Fill #1

## 2018-01-06 DIAGNOSIS — F411 Generalized anxiety disorder: Secondary | ICD-10-CM | POA: Diagnosis not present

## 2018-01-08 MED FILL — CELECOXIB 100 MG CAP: 100 | 30 days supply | Qty: 60 | Fill #1

## 2018-01-27 MED FILL — HUMIRA 40 MG/0.8ML PSKT: 40 | 28 days supply | Qty: 4 | Fill #2

## 2018-02-10 NOTE — Progress Notes (Signed)
[name] [address] [place], [place] [address] Phone: [phone] Subjective:    I [name] am serving as a Education administrator for Dr. Hulan Saas.   I'm seeing this patient by the request  of:    CC: Ankle, knee pain, back pain  EHU:[id number]  [name] is a 58 y.o. female coming in with complaint of left ankle, right hip and bilateral knee pain. Ankle is acute. States that she fell and the knee has been hurting worse since the ankle pain. States her ankle pain came about 2 weeks after the fall. Believes she lost her balance stepping off a sidewalk. Fell on her right knee. Arthritis in knees. Knees feel weak.   Onset- Fell 12/27 Pain in the ankle Jan 6th Location- Talar dome, lateral right knee, groin, piriformis  Duration-  Character- Aggravating factors- hip aches with sitting and sometimes has a sharp shooting pain, throbbing ankle pain, Knee pain is sharp Reliving factors-  Therapies tried- Pennsaid Severity-   Last medical history significant for Crohn's disease  Past Medical History:  Diagnosis Date  . Anxiety   . Crohn disease ([place])   . Depression   . GERD (gastroesophageal reflux disease)   . History of methicillin resistant staphylococcus aureus (MRSA)   . Sleep apnea    CPAP   Past Surgical History:  Procedure Laterality Date  . CESAREAN SECTION    . LIGAMENT REPAIR Right 11/23/2015   Procedure: right index radial collateral LIGAMENT REPAIR;  Surgeon: Leanora Cover, MD;  Location: [facility];  Service: Orthopedics;  Laterality: Right;   Social History   Socioeconomic History  . Marital status: Married    Spouse name: Not on file  . Number of children: Not on file  . Years of education: Not on file  . Highest education level: Not on file  Occupational History  . Not on file  Social Needs  . Financial resource strain: Not on file  . Food insecurity:    Worry: Not on file    Inability: Not on file  .  Transportation needs:    Medical: Not on file    Non-medical: Not on file  Tobacco Use  . Smoking status: Never Smoker  . Smokeless tobacco: Never Used  Substance and Sexual Activity  . Alcohol use: Yes  . Drug use: No  . Sexual activity: Yes    Birth control/protection: Pill  Lifestyle  . Physical activity:    Days per week: Not on file    Minutes per session: Not on file  . Stress: Not on file  Relationships  . Social connections:    Talks on phone: Not on file    Gets together: Not on file    Attends religious service: Not on file    Active member of club or organization: Not on file    Attends meetings of clubs or organizations: Not on file    Relationship status: Not on file  Other Topics Concern  . Not on file  Social History Narrative  . Not on file   Allergies  Allergen Reactions  . Chlorhexidine Gluconate Itching  . Hydrocodone Itching   Family History  Problem Relation Age of Onset  . Alcohol abuse Mother   . Alcohol abuse Sister   . Angioedema Neg Hx   . Allergic rhinitis Neg Hx   . Asthma Neg Hx   . Atopy Neg Hx   . Eczema Neg Hx   .  Immunodeficiency Neg Hx   . Urticaria Neg Hx     Current Outpatient Medications (Endocrine & Metabolic):  .  levonorgestrel-ethinyl estradiol (AVIANE,ALESSE,LESSINA) 0.1-20 MG-MCG tablet, Take by mouth. .  predniSONE (DELTASONE) 10 MG tablet, Take in tapering course as directed 6-5-4-3-2-1   Current Outpatient Medications (Respiratory):  .  albuterol (PROVENTIL HFA;VENTOLIN HFA) 108 (90 Base) MCG/ACT inhaler, Inhale 2 puffs into the lungs every 4 (four) hours as needed for wheezing or shortness of breath (Ane cough). (Patient not taking: Reported on 07/11/2017) .  cetirizine (ZYRTEC) 10 MG tablet, Take by mouth. .  fluticasone (FLOVENT HFA) 110 MCG/ACT inhaler, Inhale 2 puffs into the lungs 2 (two) times daily. Marland Kitchen  ipratropium (ATROVENT) 0.03 % nasal spray, Place 2 sprays into both nostrils 2 (two) times daily. .   pseudoephedrine (SUDAFED) 30 MG tablet, Take 30 mg by mouth every morning. .  VENTOLIN HFA 108 (90 Base) MCG/ACT inhaler, INHALE 2 PUFFS INTO THE LUNGS EVERY 6 HOURS AS NEEDED FOR WHEEZING OR SHORTNESS OF BREATH.  Current Outpatient Medications (Analgesics):  Marland Kitchen  Adalimumab (HUMIRA) 40 MG/0.8ML PSKT, Inject 0.8 mLs (40 mg total) into the skin once a week.   Current Outpatient Medications (Other):  .  busPIRone (BUSPAR) 7.5 MG tablet, TAKE 1 TABLET BY MOUTH TWICE A DAY .  clotrimazole-betamethasone (LOTRISONE) cream, Apply 1 application topically 2 (two) times daily. Marland Kitchen  escitalopram (LEXAPRO) 10 MG tablet, Take 1 tablet (10 mg total) by mouth daily. .  famotidine (PEPCID) 20 MG tablet, Take 20 mg by mouth daily. .  fluocinonide ointment (LIDEX) 1.32 %, Apply 1 application topically daily as needed. Marland Kitchen  LUMIGAN 0.01 % SOLN, Place 1 drop into both eyes at bedtime.    Past medical history, social, surgical and family history all reviewed in electronic medical record.  No pertanent information unless stated regarding to the chief complaint.   Review of Systems:  No headache, visual changes, nausea, vomiting, diarrhea, constipation, dizziness, abdominal pain, skin rash, fevers, chills, night sweats, weight loss, swollen lymph nodes, body aches, joint swelling, muscle aches, chest pain, shortness of breath, mood changes.   Objective  Last menstrual period 01/10/2014. Systems examined below as of    General: No apparent distress alert and oriented x3 mood and affect normal, dressed appropriately.  HEENT: Pupils equal, extraocular movements intact  Respiratory: Patient's speak in full sentences and does not appear short of breath  Cardiovascular: No lower extremity edema, non tender, no erythema  Skin: Warm dry intact with no signs of infection or rash on extremities or on axial skeleton.  Abdomen: Soft nontender  Neuro: Cranial nerves II through XII are intact, neurovascularly intact in all  extremities with 2+ DTRs and 2+ pulses.  Lymph: No lymphadenopathy of posterior or anterior cervical chain or axillae bilaterally.  Gait antalgic MSK:  Non tender with full range of motion and good stability and symmetric strength and tone of shoulders, elbows, wrist, hip, and bilaterally.  Ankle: Left No visible erythema or swelling. Range of motion is full in all directions. Strength is 5/5 in all directions. Stable lateral and medial ligaments; squeeze test and kleiger test unremarkable; Talar dome nontender; No pain at base of 5th MT; No tenderness over cuboid; No tenderness over N spot or navicular prominence No tenderness on posterior aspects of lateral and medial malleolus Positive tenderness to palpation over the lateral aspect of the ankle more over the peroneal tendons possible subluxation noted Able to walk 4 steps.  Right knee exam shows  some mild lateral tracking of the patella.  Mild positive patellar grind.  Full range of motion, good stability otherwise.  Contralateral knee unremarkable  Back exam does have some mild loss of lordosis.  Positive Faber on the right side.  Negative straight leg test.  Severe tenderness over the right sacroiliac joint.  4-5 strength of the hip abductor on the right compared to the left.  [id number]; 15 additional minutes spent for Therapeutic exercises as stated in above notes.  This included exercises focusing on stretching, strengthening, with significant focus on eccentric aspects.   Long term goals include an improvement in range of motion, strength, endurance as well as avoiding reinjury. Patient's frequency would include in 1-2 times a day, 3-5 times a week for a duration of 6-12 weeks. Ankle strengthening that included:  Basic range of motion exercises to allow proper full motion at ankle Stretching of the lower leg and hamstrings  Theraband exercises for the lower leg - inversion, eversion, dorsiflexion and plantarflexion each to be completed  with a theraband Balance exercises to increase proprioception Weight bearing exercises to increase strength and balance   Proper technique shown and discussed handout in great detail with ATC.  All questions were discussed and answered.     Impression and Recommendations:     This case required medical decision making of moderate complexity. The above documentation has been reviewed and is accurate and complete Stacey Pulley, DO       Note: This dictation was prepared with Dragon dictation along with smaller phrase technology. Any transcriptional errors that result from this process are unintentional.

## 2018-02-11 ENCOUNTER — Ambulatory Visit (INDEPENDENT_AMBULATORY_CARE_PROVIDER_SITE_OTHER): Payer: 59 | Admitting: Family Medicine

## 2018-02-11 ENCOUNTER — Encounter: Payer: Self-pay | Admitting: Family Medicine

## 2018-02-11 ENCOUNTER — Other Ambulatory Visit (INDEPENDENT_AMBULATORY_CARE_PROVIDER_SITE_OTHER): Payer: 59

## 2018-02-11 ENCOUNTER — Ambulatory Visit: Payer: Self-pay

## 2018-02-11 VITALS — BP 130/73 | HR 97 | Ht 62.0 in | Wt 146.0 lb

## 2018-02-11 DIAGNOSIS — M2550 Pain in unspecified joint: Secondary | ICD-10-CM

## 2018-02-11 DIAGNOSIS — M7672 Peroneal tendinitis, left leg: Secondary | ICD-10-CM | POA: Diagnosis not present

## 2018-02-11 DIAGNOSIS — M25572 Pain in left ankle and joints of left foot: Secondary | ICD-10-CM | POA: Diagnosis not present

## 2018-02-11 DIAGNOSIS — M222X1 Patellofemoral disorders, right knee: Secondary | ICD-10-CM | POA: Insufficient documentation

## 2018-02-11 LAB — CBC WITH DIFFERENTIAL/PLATELET
BASOS PCT: 1.4 % (ref 0.0–3.0)
Basophils Absolute: 0.1 10*3/uL (ref 0.0–0.1)
EOS ABS: 0.3 10*3/uL (ref 0.0–0.7)
Eosinophils Relative: 5.3 % — ABNORMAL HIGH (ref 0.0–5.0)
HCT: 40.4 % (ref 36.0–46.0)
Hemoglobin: 13.5 g/dL (ref 12.0–15.0)
Lymphocytes Relative: 38.8 % (ref 12.0–46.0)
Lymphs Abs: 2.4 10*3/uL (ref 0.7–4.0)
MCHC: 33.3 g/dL (ref 30.0–36.0)
MCV: 93.8 fl (ref 78.0–100.0)
Monocytes Absolute: 0.5 10*3/uL (ref 0.1–1.0)
Monocytes Relative: 8 % (ref 3.0–12.0)
Neutro Abs: 2.8 10*3/uL (ref 1.4–7.7)
Neutrophils Relative %: 46.5 % (ref 43.0–77.0)
Platelets: 248 10*3/uL (ref 150.0–400.0)
RBC: 4.3 Mil/uL (ref 3.87–5.11)
RDW: 14.1 % (ref 11.5–15.5)
WBC: 6.1 10*3/uL (ref 4.0–10.5)

## 2018-02-11 LAB — IBC PANEL
Iron: 163 ug/dL — ABNORMAL HIGH (ref 42–145)
Saturation Ratios: 41.9 % (ref 20.0–50.0)
Transferrin: 278 mg/dL (ref 212.0–360.0)

## 2018-02-11 LAB — TSH: TSH: 1.37 u[IU]/mL (ref 0.35–4.50)

## 2018-02-11 LAB — COMPREHENSIVE METABOLIC PANEL
ALT: 15 U/L (ref 0–35)
AST: 19 U/L (ref 0–37)
Albumin: 4.4 g/dL (ref 3.5–5.2)
Alkaline Phosphatase: 31 U/L — ABNORMAL LOW (ref 39–117)
BUN: 14 mg/dL (ref 6–23)
CO2: 28 mEq/L (ref 19–32)
Calcium: 9.2 mg/dL (ref 8.4–10.5)
Chloride: 106 mEq/L (ref 96–112)
Creatinine, Ser: 0.65 mg/dL (ref 0.40–1.20)
GFR: 93.77 mL/min (ref >60.00)
Glucose, Bld: 85 mg/dL (ref 70–99)
Potassium: 4.2 mEq/L (ref 3.5–5.1)
Sodium: 138 mEq/L (ref 135–145)
Total Bilirubin: 0.6 mg/dL (ref 0.2–1.2)
Total Protein: 7.2 g/dL (ref 6.0–8.3)

## 2018-02-11 LAB — SEDIMENTATION RATE: Sed Rate: 7 mm/hr (ref 0–30)

## 2018-02-11 LAB — C-REACTIVE PROTEIN: CRP: <1 mg/dL (ref 0.5–20.0)

## 2018-02-11 LAB — URIC ACID: Uric Acid, Serum: 3.7 mg/dL (ref 2.4–7.0)

## 2018-02-11 MED ORDER — VITAMIN D (ERGOCALCIFEROL) 1.25 MG (50000 UNIT) PO CAPS: 50000.0000 [IU] | ORAL_CAPSULE | ORAL | 0 refills | Status: DC

## 2018-02-11 MED ORDER — DICLOFENAC SODIUM 2 % TD SOLN: 2.0000 g | Freq: Two times a day (BID) | TRANSDERMAL | 3 refills | Status: DC

## 2018-02-11 MED FILL — VIT D2 1.25 MG (50,000 UNIT: 1.25 MG | 84 days supply | Qty: 12 | Fill #0

## 2018-02-11 NOTE — Assessment & Plan Note (Signed)
Could be secondary to patient's Crohn's disease.  Discussed the possibility of malnutrition and deficiencies.  Laboratory work-up ordered today.  Follow-up again in 4 weeks.  Once weekly vitamin D

## 2018-02-11 NOTE — Patient Instructions (Addendum)
Great to see you  Peroneal subluxation, patellofemoral syndrome and SI joint dysfunction  Ice 20 minutes 2 times daily. Usually after activity and before bed. Exercises 3 times a week. Alternate but focus on the ankle Body helix x-fit ankle brace Heel lift 1/16 to 1/8 inch can help as well  pennsaid pinkie amount topically 2 times daily as needed.  Once weekly vitamin D for 12 weeks Labs downstairs  See me again in 3-5 weeks

## 2018-02-11 NOTE — Assessment & Plan Note (Signed)
Peroneal tendinitis with chronic subluxation.  We discussed with patient about bracing, home exercise also patient work with Product/process development scientist.  We discussed icing regimen.  Discussed topical anti-inflammatories if needed.  See how patient does with conservative therapy.  Follow-up again in 4 to 6 weeks

## 2018-02-11 NOTE — Assessment & Plan Note (Signed)
Patellofemoral Syndrome  Reviewed anatomy using anatomical model and how PFS occurs.  Given rehab exercises handout for VMO, hip abductors, core, entire kinetic chain including proprioception exercises including cone touches, step downs, hip elevations and turn outs.  Could benefit from PT, regular exercise, upright biking, and a PFS knee brace to assist with tracking abnormalities. Return to clinic in 4 weeks

## 2018-02-16 MED FILL — COLESTIPOL HCL 1 GM TABLET: 1 | 30 days supply | Qty: 180 | Fill #2

## 2018-02-17 LAB — RHEUMATOID FACTOR: Rheumatoid fact SerPl-aCnc: <14 IU/mL (ref <14)

## 2018-02-17 LAB — PTH, INTACT AND CALCIUM
Calcium: 9.4 mg/dL (ref 8.6–10.4)
PTH: 21 pg/mL (ref 14–64)

## 2018-02-17 LAB — VITAMIN D 1,25 DIHYDROXY
VITAMIN D3 1, 25 (OH): 56 pg/mL
Vitamin D 1, 25 (OH)2 Total: 56 pg/mL (ref 18–72)

## 2018-02-17 LAB — CALCIUM, IONIZED: Calcium, Ion: 5.01 mg/dL (ref 4.8–5.6)

## 2018-02-17 LAB — ANA: Anti Nuclear Antibody(ANA): NEGATIVE

## 2018-02-17 LAB — ANGIOTENSIN CONVERTING ENZYME: ANGIOTENSIN-CONVERTING ENZYME: 4 U/L — AB (ref 9–67)

## 2018-02-17 LAB — CYCLIC CITRUL PEPTIDE ANTIBODY, IGG: Cyclic Citrullin Peptide Ab: <16 UNITS

## 2018-02-20 DIAGNOSIS — F411 Generalized anxiety disorder: Secondary | ICD-10-CM | POA: Diagnosis not present

## 2018-02-27 ENCOUNTER — Other Ambulatory Visit: Payer: Self-pay | Admitting: Internal Medicine

## 2018-02-27 MED FILL — ESCITALOPRAM 10 MG TABLET: 10 | 90 days supply | Qty: 90 | Fill #0

## 2018-02-27 MED FILL — busPIRone HCL 7.5 MG TABS: 7.5 | 90 days supply | Qty: 180 | Fill #1

## 2018-03-04 DIAGNOSIS — D225 Melanocytic nevi of trunk: Secondary | ICD-10-CM | POA: Diagnosis not present

## 2018-03-04 DIAGNOSIS — D1801 Hemangioma of skin and subcutaneous tissue: Secondary | ICD-10-CM | POA: Diagnosis not present

## 2018-03-04 DIAGNOSIS — D2272 Melanocytic nevi of left lower limb, including hip: Secondary | ICD-10-CM | POA: Diagnosis not present

## 2018-03-04 DIAGNOSIS — L814 Other melanin hyperpigmentation: Secondary | ICD-10-CM | POA: Diagnosis not present

## 2018-03-04 DIAGNOSIS — D2262 Melanocytic nevi of left upper limb, including shoulder: Secondary | ICD-10-CM | POA: Diagnosis not present

## 2018-03-04 DIAGNOSIS — D2371 Other benign neoplasm of skin of right lower limb, including hip: Secondary | ICD-10-CM | POA: Diagnosis not present

## 2018-03-04 DIAGNOSIS — D2261 Melanocytic nevi of right upper limb, including shoulder: Secondary | ICD-10-CM | POA: Diagnosis not present

## 2018-03-04 DIAGNOSIS — D2271 Melanocytic nevi of right lower limb, including hip: Secondary | ICD-10-CM | POA: Diagnosis not present

## 2018-03-04 DIAGNOSIS — L821 Other seborrheic keratosis: Secondary | ICD-10-CM | POA: Diagnosis not present

## 2018-03-10 NOTE — Progress Notes (Signed)
[name] [address] [place], [place] [address] Phone: [phone] Subjective:    CC: Ankle pain follow-up  FMB:[id number]     Update 03/11/2018: [name] is a 57 y.o. female coming in with complaint of left ankle pain. Has been wearing a brace to exercise. Feels that her pain is less than previous visit. Does have achy feeling when playing tennis.  Peroneal tendinitis.  Doing home exercises on a more regular basis.  Has been ill and not been playing tennis would state about 90% better.    Past Medical History:  Diagnosis Date  . Anxiety   . Crohn disease ([place])   . Depression   . GERD (gastroesophageal reflux disease)   . History of methicillin resistant staphylococcus aureus (MRSA)   . Sleep apnea    CPAP   Past Surgical History:  Procedure Laterality Date  . CESAREAN SECTION    . LIGAMENT REPAIR Right 11/23/2015   Procedure: right index radial collateral LIGAMENT REPAIR;  Surgeon: Leanora Cover, MD;  Location: [facility];  Service: Orthopedics;  Laterality: Right;   Social History   Socioeconomic History  . Marital status: Married    Spouse name: Not on file  . Number of children: Not on file  . Years of education: Not on file  . Highest education level: Not on file  Occupational History  . Not on file  Social Needs  . Financial resource strain: Not on file  . Food insecurity:    Worry: Not on file    Inability: Not on file  . Transportation needs:    Medical: Not on file    Non-medical: Not on file  Tobacco Use  . Smoking status: Never Smoker  . Smokeless tobacco: Never Used  Substance and Sexual Activity  . Alcohol use: Yes  . Drug use: No  . Sexual activity: Yes    Birth control/protection: Pill  Lifestyle  . Physical activity:    Days per week: Not on file    Minutes per session: Not on file  . Stress: Not on file  Relationships  . Social connections:    Talks on phone: Not on file    Gets  together: Not on file    Attends religious service: Not on file    Active member of club or organization: Not on file    Attends meetings of clubs or organizations: Not on file    Relationship status: Not on file  Other Topics Concern  . Not on file  Social History Narrative  . Not on file   Allergies  Allergen Reactions  . Chlorhexidine Gluconate Itching  . Hydrocodone Itching   Family History  Problem Relation Age of Onset  . Alcohol abuse Mother   . Alcohol abuse Sister   . Angioedema Neg Hx   . Allergic rhinitis Neg Hx   . Asthma Neg Hx   . Atopy Neg Hx   . Eczema Neg Hx   . Immunodeficiency Neg Hx   . Urticaria Neg Hx     Current Outpatient Medications (Endocrine & Metabolic):  .  levonorgestrel-ethinyl estradiol (AVIANE,ALESSE,LESSINA) 0.1-20 MG-MCG tablet, Take by mouth. .  predniSONE (DELTASONE) 10 MG tablet, Take in tapering course as directed 6-5-4-3-2-1  Current Outpatient Medications (Cardiovascular):  .  colestipol (COLESTID) 1 g tablet, Take 1 g by mouth 2 (two) times daily.  Current Outpatient Medications (Respiratory):  .  albuterol (PROVENTIL HFA;VENTOLIN HFA) 108 (90 Base) MCG/ACT inhaler,  Inhale 2 puffs into the lungs every 4 (four) hours as needed for wheezing or shortness of breath (Ane cough). .  cetirizine (ZYRTEC) 10 MG tablet, Take by mouth. .  fluticasone (FLOVENT HFA) 110 MCG/ACT inhaler, Inhale 2 puffs into the lungs 2 (two) times daily. Marland Kitchen  ipratropium (ATROVENT) 0.03 % nasal spray, Place 2 sprays into both nostrils 2 (two) times daily. .  pseudoephedrine (SUDAFED) 30 MG tablet, Take 30 mg by mouth every morning. .  VENTOLIN HFA 108 (90 Base) MCG/ACT inhaler, INHALE 2 PUFFS INTO THE LUNGS EVERY 6 HOURS AS NEEDED FOR WHEEZING OR SHORTNESS OF BREATH.  Current Outpatient Medications (Analgesics):  Marland Kitchen  Adalimumab (HUMIRA) 40 MG/0.8ML PSKT, Inject 0.8 mLs (40 mg total) into the skin once a week.   Current Outpatient Medications (Other):  .   busPIRone (BUSPAR) 7.5 MG tablet, TAKE 1 TABLET BY MOUTH TWICE A DAY .  clotrimazole-betamethasone (LOTRISONE) cream, Apply 1 application topically 2 (two) times daily. .  Diclofenac Sodium (PENNSAID) 2 % SOLN, Place onto the skin. .  Diclofenac Sodium (PENNSAID) 2 % SOLN, Place 2 g onto the skin 2 (two) times daily. Marland Kitchen  escitalopram (LEXAPRO) 10 MG tablet, TAKE 1 TABLET (10 MG TOTAL) BY MOUTH DAILY. .  famotidine (PEPCID) 20 MG tablet, Take 20 mg by mouth daily. .  fluocinonide ointment (LIDEX) 4.92 %, Apply 1 application topically daily as needed. Marland Kitchen  LUMIGAN 0.01 % SOLN, Place 1 drop into both eyes at bedtime. .  Vitamin D, Ergocalciferol, (DRISDOL) 1.25 MG (50000 UT) CAPS capsule, Take 1 capsule (50,000 Units total) by mouth every 7 (seven) days.    Past medical history, social, surgical and family history all reviewed in electronic medical record.  No pertanent information unless stated regarding to the chief complaint.   Review of Systems:  No headache, visual changes, nausea, vomiting, diarrhea, constipation, dizziness, abdominal pain, skin rash, fevers, chills, night sweats, weight loss, swollen lymph nodes, body aches, joint swelling, muscle aches, chest pain, shortness of breath, mood changes.   Objective  Blood pressure 102/66, pulse 87, height 5' 2"  (1.575 m), weight 145 lb (65.8 kg), last menstrual period 01/10/2014, SpO2 97 %.   General: No apparent distress alert and oriented x3 mood and affect normal, dressed appropriately.  HEENT: Pupils equal, extraocular movements intact  Respiratory: Patient's speak in full sentences and does not appear short of breath  Cardiovascular: No lower extremity edema, non tender, no erythema  Skin: Warm dry intact with no signs of infection or rash on extremities or on axial skeleton.  Abdomen: Soft nontender  Neuro: Cranial nerves II through XII are intact, neurovascularly intact in all extremities with 2+ DTRs and 2+ pulses.  Lymph: No  lymphadenopathy of posterior or anterior cervical chain or axillae bilaterally.  Gait normal with good balance and coordination.  MSK:  Non tender with full range of motion and good stability and symmetric strength and tone of shoulders, elbows, wrist, hip, knee bilaterally.  Ankle: Left No visible erythema or swelling. Decrease range of motion all planes of 5 to 10 degrees. Strength is 5/5 in all directions. Stable lateral and medial ligaments; squeeze test and kleiger test unremarkable; Talar dome notably tender; No pain at base of 5th MT; No tenderness over cuboid; No tenderness over N spot or navicular prominence No tenderness on posterior aspects of lateral and medial malleolus No sign of peroneal tendon subluxations or tenderness to palpation Negative tarsal tunnel tinel's Able to walk 4 steps.  Musculoskeletal ultrasound  was performed and interpreted by Lyndal Pulley  Ultrasound significantly decreased since previous exam. No significant hypoechoic changes around the peroneal tendon noted.  Impression: Interval improvement of the peroneal subluxation   Impression and Recommendations:     This case required medical decision making of moderate complexity. The above documentation has been reviewed and is accurate and complete Lyndal Pulley, DO       Note: This dictation was prepared with Dragon dictation along with smaller phrase technology. Any transcriptional errors that result from this process are unintentional.

## 2018-03-11 ENCOUNTER — Ambulatory Visit (INDEPENDENT_AMBULATORY_CARE_PROVIDER_SITE_OTHER): Payer: 59 | Admitting: Family Medicine

## 2018-03-11 ENCOUNTER — Ambulatory Visit: Payer: Self-pay

## 2018-03-11 VITALS — BP 102/66 | HR 87 | Ht 62.0 in | Wt 145.0 lb

## 2018-03-11 DIAGNOSIS — M25572 Pain in left ankle and joints of left foot: Secondary | ICD-10-CM

## 2018-03-11 DIAGNOSIS — M7672 Peroneal tendinitis, left leg: Secondary | ICD-10-CM | POA: Diagnosis not present

## 2018-03-11 NOTE — Patient Instructions (Signed)
Good to see you  The cough will take a little morre time Wear the compression with exercising another 6 weeks pennsaid pinkie amount topically 2 times daily as needed.  If need ibuprofen then I would take 3 pills 3 times a day for 3 days  See me again in 6 weeks if not perfect  [phone]

## 2018-03-11 NOTE — Assessment & Plan Note (Signed)
Improvement noted.  Discussed icing regimen and home exercise.  Discussed which activities of doing which wants to avoid.  Topical anti-inflammatories and compression still.  Follow-up as needed

## 2018-03-13 ENCOUNTER — Telehealth: Payer: Self-pay | Admitting: Internal Medicine

## 2018-03-13 MED ORDER — VALACYCLOVIR HCL 500 MG PO TABS: 500.0000 mg | ORAL_TABLET | Freq: Three times a day (TID) | ORAL | 3 refills | Status: DC

## 2018-03-13 MED FILL — VALACYCLOVIR HCL 500 MG TAB: 500 | 5 days supply | Qty: 15 | Fill #0 | Status: TO

## 2018-03-13 NOTE — Telephone Encounter (Signed)
Patient requesting a Rx for a cold sore.  States that it is already raised on the skin.    Pharmacy:  [facility]  Phone:  [phone]  Thank you.

## 2018-03-13 NOTE — Telephone Encounter (Signed)
Call in Valtrex 500 mg po x 5 days with 3 refills.

## 2018-03-13 NOTE — Telephone Encounter (Signed)
Sent in

## 2018-03-17 ENCOUNTER — Telehealth: Payer: Self-pay | Admitting: Pulmonary Disease

## 2018-03-17 ENCOUNTER — Encounter: Payer: Self-pay | Admitting: Primary Care

## 2018-03-17 ENCOUNTER — Ambulatory Visit (INDEPENDENT_AMBULATORY_CARE_PROVIDER_SITE_OTHER): Payer: 59 | Admitting: Primary Care

## 2018-03-17 VITALS — BP 104/58 | HR 99 | Temp 99.0°F | Ht 63.0 in | Wt 143.4 lb

## 2018-03-17 DIAGNOSIS — R05 Cough: Secondary | ICD-10-CM

## 2018-03-17 DIAGNOSIS — J45991 Cough variant asthma: Secondary | ICD-10-CM

## 2018-03-17 DIAGNOSIS — R059 Cough, unspecified: Secondary | ICD-10-CM

## 2018-03-17 LAB — POCT INFLUENZA A/B
Influenza A, POC: NEGATIVE
Influenza B, POC: NEGATIVE

## 2018-03-17 LAB — NITRIC OXIDE: Nitric Oxide: 32

## 2018-03-17 MED ORDER — ALBUTEROL SULFATE HFA 108 (90 BASE) MCG/ACT IN AERS: 2.0000 | INHALATION_SPRAY | RESPIRATORY_TRACT | 3 refills | Status: DC | PRN

## 2018-03-17 MED ORDER — IPRATROPIUM BROMIDE 0.03 % NA SOLN: 2.0000 | Freq: Two times a day (BID) | NASAL | 12 refills | Status: DC

## 2018-03-17 MED ORDER — BENZONATATE 200 MG PO CAPS: 200.0000 mg | ORAL_CAPSULE | Freq: Three times a day (TID) | ORAL | 1 refills | Status: DC | PRN

## 2018-03-17 MED ORDER — FLUTICASONE PROPIONATE HFA 110 MCG/ACT IN AERO: 2.0000 | INHALATION_SPRAY | Freq: Two times a day (BID) | RESPIRATORY_TRACT | 5 refills | Status: DC

## 2018-03-17 MED FILL — FLOVENT HFA 110 MCG INHALER: 110 | 30 days supply | Qty: 12 | Fill #0 | Status: TO

## 2018-03-17 MED FILL — VENTOLIN HFA 90 MCG INHALER: 108 (90 BAS | 17 days supply | Qty: 18 | Fill #0

## 2018-03-17 MED FILL — IPRATROPIUM 0.03% SPRAY: 0.03 | 43 days supply | Qty: 30 | Fill #0

## 2018-03-17 MED FILL — BENZONATATE 200 MG CAPS: 200 | 15 days supply | Qty: 45 | Fill #0

## 2018-03-17 NOTE — Patient Instructions (Addendum)
Office testing: - FENO 32 - Rapid flu __neg_____  Cough: - Advise Delsym cough syrup twice daily for cough  - RX for tessalon perles - take every 8 hours for cough - Refill Atrovent nasal spray - take as directed   Asthma: - Take Flovent two puffs twice a day scheduled when asthma symptoms are flaring or during allergy seasons  - Back up is your Albuterol rescue inhaler 2 puffs every 4-6 hours as needed for wheezing/shortness of breath (no more than 6 times a day) - Take prednisone 20mg  x 5 days if above not effective   If no improvement in 7-10 days please return/or call, consider CXR at that time

## 2018-03-17 NOTE — Progress Notes (Signed)
@Patient  ID: [name], female    DOB: [birth year], 58 y.o.   MRN: [id number]  Chief Complaint  Patient presents with  . Acute Visit    non-productive cough, wheezing, shortness of breath    Referring provider: Elby Showers, MD  HPI: 58 year old female, never smoked. PMH OSA, cough variant asthma, sinobronchitis. Patient of Dr. Elsworth Soho, last seen on 01/02/16.   03/18/2018 Patient presents for acute visit with reports of dry cough x 3 weeks. States that's she had a bad cold recently with HA, chills, aches. She has been taking delsym severe cold and flu. Not current taking her Flovent inhaler. Using her rescue inhaler 12 times a day. Low grade temp. Flu negative.   Allergies  Allergen Reactions  . Chlorhexidine Gluconate Itching  . Hydrocodone Itching    Immunization History  Administered Date(s) Administered  . Hepatitis B, adult 11/25/2013, 01/24/2014, 05/26/2014  . Influenza,inj,Quad PF,6+ Mos 11/25/2013, 02/13/2015, 11/28/2015, 02/12/2017, 10/14/2017  . Pneumococcal Conjugate-13 12/29/2015  . Tdap 07/11/2017    Past Medical History:  Diagnosis Date  . Anxiety   . Crohn disease ([place])   . Depression   . GERD (gastroesophageal reflux disease)   . History of methicillin resistant staphylococcus aureus (MRSA)   . Sleep apnea    CPAP    Tobacco History: Social History   Tobacco Use  Smoking Status Never Smoker  Smokeless Tobacco Never Used   Counseling given: Not Answered   Outpatient Medications Prior to Visit  Medication Sig Dispense Refill  . Adalimumab (HUMIRA) 40 MG/0.8ML PSKT Inject 0.8 mLs (40 mg total) into the skin once a week. 4 each 6  . busPIRone (BUSPAR) 7.5 MG tablet TAKE 1 TABLET BY MOUTH TWICE A DAY 180 tablet 1  . cetirizine (ZYRTEC) 10 MG tablet Take by mouth.    . colestipol (COLESTID) 1 g tablet Take 1 g by mouth 2 (two) times daily.    Marland Kitchen escitalopram (LEXAPRO) 10 MG tablet TAKE 1 TABLET (10 MG TOTAL) BY MOUTH DAILY. 90 tablet 1  .  famotidine (PEPCID) 20 MG tablet Take 20 mg by mouth daily.    . fluocinonide ointment (LIDEX) 5.00 % Apply 1 application topically daily as needed.    Marland Kitchen levonorgestrel-ethinyl estradiol (AVIANE,ALESSE,LESSINA) 0.1-20 MG-MCG tablet Take by mouth.    . LUMIGAN 0.01 % SOLN Place 1 drop into both eyes at bedtime.  11  . pseudoephedrine (SUDAFED) 30 MG tablet Take 30 mg by mouth every morning.    . valACYclovir (VALTREX) 500 MG tablet Take 1 tablet (500 mg total) by mouth 3 (three) times daily. 15 tablet 3  . Vitamin D, Ergocalciferol, (DRISDOL) 1.25 MG (50000 UT) CAPS capsule Take 1 capsule (50,000 Units total) by mouth every 7 (seven) days. 12 capsule 0  . albuterol (PROVENTIL HFA;VENTOLIN HFA) 108 (90 Base) MCG/ACT inhaler Inhale 2 puffs into the lungs every 4 (four) hours as needed for wheezing or shortness of breath (Ane cough). 1 Inhaler 3  . fluticasone (FLOVENT HFA) 110 MCG/ACT inhaler Inhale 2 puffs into the lungs 2 (two) times daily. 1 Inhaler 5  . ipratropium (ATROVENT) 0.03 % nasal spray Place 2 sprays into both nostrils 2 (two) times daily. 30 mL 12  . VENTOLIN HFA 108 (90 Base) MCG/ACT inhaler INHALE 2 PUFFS INTO THE LUNGS EVERY 6 HOURS AS NEEDED FOR WHEEZING OR SHORTNESS OF BREATH. 18 g prn  . clotrimazole-betamethasone (LOTRISONE) cream Apply 1 application topically 2 (two) times daily. (Patient not taking: Reported  on 03/17/2018) 30 g 0  . Diclofenac Sodium (PENNSAID) 2 % SOLN Place onto the skin.    . Diclofenac Sodium (PENNSAID) 2 % SOLN Place 2 g onto the skin 2 (two) times daily. (Patient not taking: Reported on 03/17/2018) 112 g 3  . predniSONE (DELTASONE) 10 MG tablet Take in tapering course as directed 6-5-4-3-2-1 (Patient not taking: Reported on 03/17/2018) 21 tablet 0   No facility-administered medications prior to visit.     Review of Systems  Review of Systems  Constitutional: Positive for chills and fever.  Respiratory: Positive for cough and wheezing.   Cardiovascular:  Negative.     Physical Exam  BP (!) 104/58 (BP Location: Left Arm, Cuff Size: Normal)   Pulse 99   Temp 99 F (37.2 C) (Oral)   Ht 5\' 3"  (1.6 m)   Wt 143 lb 6.4 oz (65 kg)   LMP 01/10/2014   SpO2 96%   BMI 25.40 kg/m  Physical Exam Constitutional:      Appearance: She is well-developed.  HENT:     Head: Normocephalic and atraumatic.  Eyes:     Pupils: Pupils are equal, round, and reactive to light.  Neck:     Musculoskeletal: Normal range of motion and neck supple.  Cardiovascular:     Rate and Rhythm: Normal rate and regular rhythm.     Heart sounds: Normal heart sounds. No murmur.  Pulmonary:     Effort: Pulmonary effort is normal. No respiratory distress.     Breath sounds: Normal breath sounds. No wheezing.  Abdominal:     General: Bowel sounds are normal.     Palpations: Abdomen is soft.     Tenderness: There is no abdominal tenderness.  Skin:    General: Skin is warm and dry.     Findings: No erythema or rash.  Neurological:     Mental Status: She is alert and oriented to person, place, and time.  Psychiatric:        Behavior: Behavior normal.        Judgment: Judgment normal.      Lab Results:  CBC    Component Value Date/Time   WBC 6.1 02/11/2018 0951   RBC 4.30 02/11/2018 0951   HGB 13.5 02/11/2018 0951   HCT 40.4 02/11/2018 0951   PLT 248.0 02/11/2018 0951   MCV 93.8 02/11/2018 0951   MCH 30.8 07/10/2017 0950   MCHC 33.3 02/11/2018 0951   RDW 14.1 02/11/2018 0951   LYMPHSABS 2.4 02/11/2018 0951   MONOABS 0.5 02/11/2018 0951   EOSABS 0.3 02/11/2018 0951   BASOSABS 0.1 02/11/2018 0951    BMET    Component Value Date/Time   NA 138 02/11/2018 0951   K 4.2 02/11/2018 0951   CL 106 02/11/2018 0951   CO2 28 02/11/2018 0951   GLUCOSE 85 02/11/2018 0951   BUN 14 02/11/2018 0951   CREATININE 0.65 02/11/2018 0951   CREATININE 0.72 07/10/2017 0950   CALCIUM 9.4 02/11/2018 0951   CALCIUM 9.2 02/11/2018 0951   GFRNONAA 94 07/10/2017 0950    GFRAA 108 07/10/2017 0950    BNP No results found for: BNP  ProBNP No results found for: PROBNP  Imaging: No results found.   Assessment & Plan:   Cough variant asthma - Persistent dry cough x 3 weeks  - Not taking Flovent, using rescue inhaler 12 times daily - FENO 32 - Instructed patient to resume ICS/Flovent twice daily; reviewed proper use Ventolin prn  - Patient  declined prednisone taper. If above plan not effective, patient to take prednisone 20mg  x 5 days  - Advised Delsym cough syrup twice daily for cough  - RX for tessalon perles q 8 hours for cough - Refill Atrovent nasal spray    Martyn Ehrich, NP 03/18/2018

## 2018-03-18 ENCOUNTER — Encounter: Payer: Self-pay | Admitting: Primary Care

## 2018-03-18 MED FILL — ESTRADIOL-NORETHINDRONE ACE: 1-0.5 | 84 days supply | Qty: 84 | Fill #2 | Status: TO

## 2018-03-18 MED FILL — CELECOXIB 100 MG CAP: 100 | 30 days supply | Qty: 60 | Fill #2

## 2018-03-18 NOTE — Telephone Encounter (Signed)
[name] was this opened in error? Please advise as I do not see where the documentation is. Thank you.

## 2018-03-18 NOTE — Assessment & Plan Note (Signed)
-   Persistent dry cough x 3 weeks  - Not taking Flovent, using rescue inhaler 12 times daily - FENO 32 - Instructed patient to resume ICS/Flovent twice daily; reviewed proper use Ventolin prn  - Patient declined prednisone taper. If above plan not effective, patient to take prednisone 20mg  x 5 days  - Advised Delsym cough syrup twice daily for cough  - RX for tessalon perles q 8 hours for cough - Refill Atrovent nasal spray

## 2018-03-19 NOTE — Telephone Encounter (Signed)
Per Marcene Brawn opened in error will close.

## 2018-04-15 ENCOUNTER — Other Ambulatory Visit: Payer: Self-pay | Admitting: Family Medicine

## 2018-04-15 MED FILL — COLESTIPOL HCL 1 GM TABLET: 1 | 30 days supply | Qty: 180 | Fill #0

## 2018-04-15 MED FILL — HUMIRA 40 MG/0.8ML PSKT: 40 | 28 days supply | Qty: 4 | Fill #0

## 2018-04-15 MED FILL — LUMIGAN 0.01% EYE DROPS: 0.01 | 25 days supply | Qty: 3 | Fill #0

## 2018-04-16 ENCOUNTER — Ambulatory Visit: Payer: 59 | Admitting: Family Medicine

## 2018-04-16 MED FILL — VALACYCLOVIR HCL 500 MG TAB: 500 | 5 days supply | Qty: 15 | Fill #0

## 2018-04-16 MED FILL — VIT D2 1.25 MG (50,000 UNIT: 1.25 MG | 84 days supply | Qty: 12 | Fill #0

## 2018-04-28 MED FILL — VALACYCLOVIR HCL 500 MG TAB: 500 | 5 days supply | Qty: 15 | Fill #1

## 2018-04-28 MED FILL — FLOVENT HFA 110 MCG INHALER: 110 | 30 days supply | Qty: 12 | Fill #0

## 2018-05-22 DIAGNOSIS — Z03818 Encounter for observation for suspected exposure to other biological agents ruled out: Secondary | ICD-10-CM | POA: Diagnosis not present

## 2018-05-25 ENCOUNTER — Other Ambulatory Visit: Payer: Self-pay | Admitting: Internal Medicine

## 2018-05-25 MED FILL — BUSPIRONE HCL 7.5 MG TABS: 7.5 | 90 days supply | Qty: 180 | Fill #0

## 2018-05-25 MED FILL — CELECOXIB 100 MG CAP: 100 | 30 days supply | Qty: 60 | Fill #0

## 2018-05-25 MED FILL — ESTRADIOL-NORETHINDRONE ACE: 1-0.5 | 84 days supply | Qty: 84 | Fill #0

## 2018-05-25 MED FILL — ESCITALOPRAM 10 MG TABLET: 10 | 90 days supply | Qty: 90 | Fill #0

## 2018-05-26 ENCOUNTER — Ambulatory Visit (INDEPENDENT_AMBULATORY_CARE_PROVIDER_SITE_OTHER): Payer: 59 | Admitting: Internal Medicine

## 2018-05-26 ENCOUNTER — Encounter: Payer: Self-pay | Admitting: Internal Medicine

## 2018-05-26 ENCOUNTER — Telehealth: Payer: Self-pay | Admitting: Internal Medicine

## 2018-05-26 VITALS — Ht 63.0 in

## 2018-05-26 DIAGNOSIS — L0293 Carbuncle, unspecified: Secondary | ICD-10-CM | POA: Diagnosis not present

## 2018-05-26 DIAGNOSIS — Z8719 Personal history of other diseases of the digestive system: Secondary | ICD-10-CM | POA: Diagnosis not present

## 2018-05-26 MED ORDER — DOXYCYCLINE HYCLATE 100 MG PO TABS: 100.0000 mg | ORAL_TABLET | Freq: Two times a day (BID) | ORAL | 1 refills | Status: DC

## 2018-05-26 MED FILL — DOXYCYCLINE HYCLATE 100 MG: 100 | 10 days supply | Qty: 20 | Fill #0

## 2018-05-26 NOTE — Telephone Encounter (Signed)
scheduled

## 2018-05-26 NOTE — Telephone Encounter (Signed)
[name] [phone]  [name] called to say that she has staff infection on her face, it is increasing instead of getting better, she is up to 7 spots now. Okay with virtual visit.

## 2018-05-26 NOTE — Telephone Encounter (Signed)
Set up virtual visit 

## 2018-05-29 NOTE — Progress Notes (Signed)
   Subjective:    Patient ID: [name], female    DOB: January 04, 1961, 58 y.o.   MRN: [id number]  HPI 58 year old female with history of Crohn's disease treated by Dr. Ellender Hose at [facility] currently on Humira seen today by interactive audio and video telecommunications due to the coronavirus pandemic.  Patient agrees to visit in this format today.  She is identified by 2 identifiers as [name], a patient in this practice  She sees Dr. Nelva Bush for allergic rhinoconjunctivitis, cough variant asthma and reflux.  History of Staph aureus abscess left fourth finger October 2017 requiring surgical drainage.  Subsequently developed a couple of pustules on her right chin area.  Culture was sent from the chin area and culture grew staph aureus sensitive to Bactrim with which she was treated.  She called today and said she had several areas on her face that were similar to those of 2017.  She is not sure why these appeared fairly suddenly over the past week or so.  They are not going away and she is concerned.  She says they are now 7 lesions on her face.  They are not draining pus.  They are papular and tender to touch.    Review of Systems she denies fever or chills.     Objective:   Physical Exam Multiple small papules on face that are erythematous.  They do not appear to be draining.  They are single papules in several different places of the chin and cheeks.       Assessment & Plan:  Recurrent carbuncles of face  Crohn's disease treated with Humira  History of staph aureus on face in 2017  Plan: Doxycycline 100 mg twice daily for 10 days.  Call if not better in 10 days or sooner if worse.  Total of 15 minutes spent with patient and virtual visit as well as reviewing her medical records and previous treatment.

## 2018-05-29 NOTE — Patient Instructions (Addendum)
Warm hot compresses to carbuncles 20 minutes twice daily.  Doxycycline 100 mg twice daily for 10 days.  Call if not better in 10 days or sooner if worse

## 2018-06-05 MED FILL — DOXYCYCLINE HYCLATE 100 MG: 100 | 10 days supply | Qty: 20 | Fill #1

## 2018-06-17 DIAGNOSIS — F411 Generalized anxiety disorder: Secondary | ICD-10-CM | POA: Diagnosis not present

## 2018-06-19 IMAGING — XA DG FLUORO GUIDE NDL PLC/BX
2 series · 2 of 2 positions shown · non-contrast
Comparison: none

CLINICAL DATA: Right index finger pain. Concern for radial ligament
injury.

[Series 1: ortho adipose · 1 of 1 slices shown (1 of 2)]
[im 1/1]
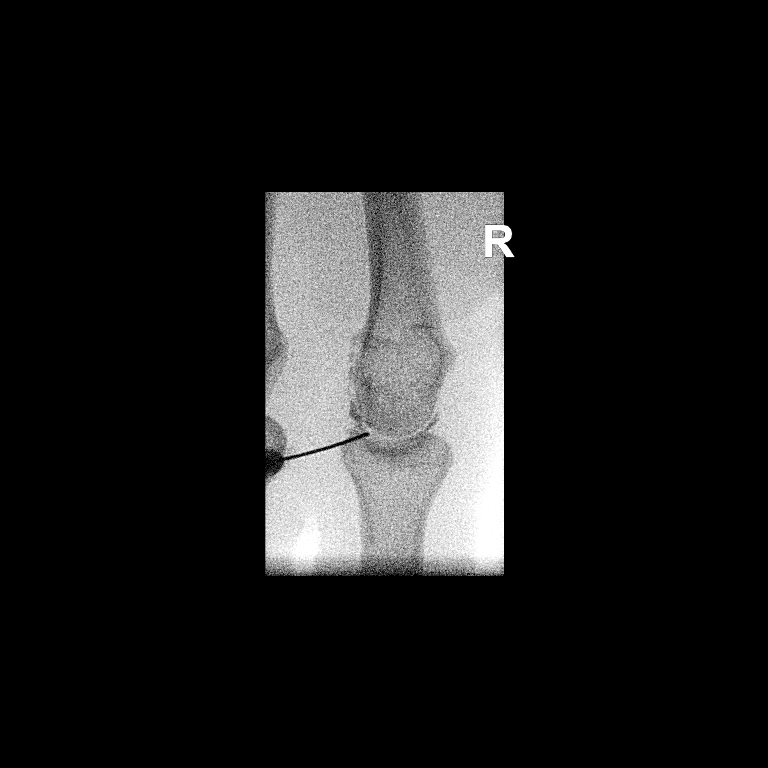

[Series 2: ortho adipose · 1 of 1 slices shown (2 of 2)]
[im 1/1]
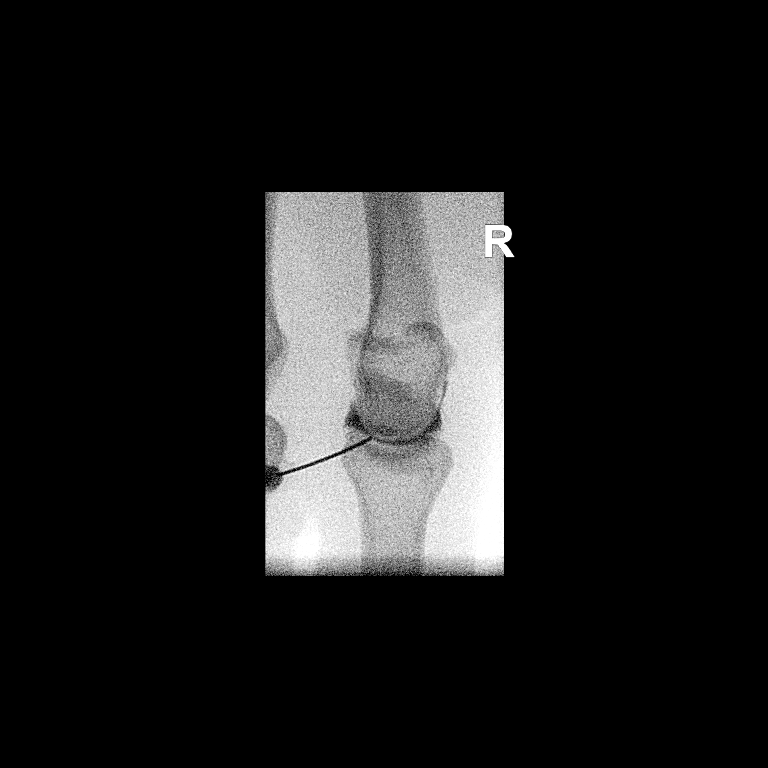

[2 of 2 positions shown; findings below may reference images not displayed]

FLUOROSCOPY TIME:  Radiation Exposure Index (as provided by the
fluoroscopic device): 0.18 microGray*m^2

Fluoroscopy Time (in minutes and seconds):  3 seconds

PROCEDURE:
RIGHT INDEX FINGER METACARPOPHALANGEAL JOINT INJECTION UNDER
FLUOROSCOPY

An appropriate skin entrance site was determined. The site was
marked, prepped with Betadine, draped in the usual sterile fashion,
and infiltrated locally with 1% Lidocaine. A 25 gauge skin needle
was advanced into the ulnar aspect of the index finger
metacarpophalangeal joint under intermittent fluoroscopy. A mixture
of 0.05 mL of MultiHance, 5 mL of 1% lidocaine, and 15 mL of
Isovue-M 200 was then used to opacify the MCP joint. 1 mL was
injected. No immediate complication.
IMPRESSION: Technically successful right index finger MCP joint injection for
MRI.

## 2018-06-23 DIAGNOSIS — L0101 Non-bullous impetigo: Secondary | ICD-10-CM | POA: Diagnosis not present

## 2018-06-23 DIAGNOSIS — L0109 Other impetigo: Secondary | ICD-10-CM | POA: Diagnosis not present

## 2018-06-23 DIAGNOSIS — L738 Other specified follicular disorders: Secondary | ICD-10-CM | POA: Diagnosis not present

## 2018-06-23 MED FILL — MUPIROCIN 2% OINTMENT: 2 | 10 days supply | Qty: 22 | Fill #0

## 2018-06-23 MED FILL — CEPHALEXIN 500 MG CAPSULE: 500 | 7 days supply | Qty: 28 | Fill #0

## 2018-07-21 DIAGNOSIS — L738 Other specified follicular disorders: Secondary | ICD-10-CM | POA: Diagnosis not present

## 2018-07-21 DIAGNOSIS — L0889 Other specified local infections of the skin and subcutaneous tissue: Secondary | ICD-10-CM | POA: Diagnosis not present

## 2018-07-21 DIAGNOSIS — L0109 Other impetigo: Secondary | ICD-10-CM | POA: Diagnosis not present

## 2018-07-21 MED FILL — SULFAMETHOXAZOLE-TMP DS TAB: 800-160 | 14 days supply | Qty: 28 | Fill #0

## 2018-07-24 ENCOUNTER — Other Ambulatory Visit: Payer: Self-pay | Admitting: Family Medicine

## 2018-07-24 MED FILL — CELECOXIB 100 MG CAP: 100 | 30 days supply | Qty: 60 | Fill #0

## 2018-07-27 MED FILL — VIT D2 1.25 MG (50,000 UNIT: 1.25 MG | 84 days supply | Qty: 12 | Fill #0

## 2018-07-30 DIAGNOSIS — F411 Generalized anxiety disorder: Secondary | ICD-10-CM | POA: Diagnosis not present

## 2018-08-12 DIAGNOSIS — H10413 Chronic giant papillary conjunctivitis, bilateral: Secondary | ICD-10-CM | POA: Diagnosis not present

## 2018-08-12 DIAGNOSIS — H40013 Open angle with borderline findings, low risk, bilateral: Secondary | ICD-10-CM | POA: Diagnosis not present

## 2018-08-12 DIAGNOSIS — H2513 Age-related nuclear cataract, bilateral: Secondary | ICD-10-CM | POA: Diagnosis not present

## 2018-08-13 MED FILL — LUMIGAN 0.01% EYE DROPS: 0.01 | 25 days supply | Qty: 3 | Fill #0

## 2018-08-14 ENCOUNTER — Telehealth (INDEPENDENT_AMBULATORY_CARE_PROVIDER_SITE_OTHER): Payer: 59 | Admitting: Pharmacist

## 2018-08-14 DIAGNOSIS — Z79899 Other long term (current) drug therapy: Secondary | ICD-10-CM

## 2018-08-14 MED ORDER — HUMIRA (2 SYRINGE) 40 MG/0.4ML ~~LOC~~ PSKT: 0.4000 mL | PREFILLED_SYRINGE | SUBCUTANEOUS | 2 refills | Status: DC

## 2018-08-14 NOTE — Progress Notes (Signed)
S: Patient presents today for review of her Humira.  Patient is currently taking Humira for Crohn's Disease. Patient is managed by Kristie Cowman at [facility] for this.   Adherence: denies any missed doses of Humira.    Dosing:  40 mg every week.   Screening: TB test: completed prior to initiation  Hepatitis: completed prior to initiation  Efficacy: reports that it is working very well for her and that she has not really had any side effects but does mention that the Humira would sting when she was injecting it.   Monitoring: S/sx of infection: denies CBC: last one WNL (see care everywhere) S/sx of hypersensitivity: denies S/sx of malignancy: denies S/sx of heart failure: denies  Drug-specific monitoring: Adalimumab levels 7.0 (10/27/2017) Anti-Adalimumab Ab levels: 51 (10/27/2017)   O:     Lab Results  Component Value Date   WBC 6.1 02/11/2018   HGB 13.5 02/11/2018   HCT 40.4 02/11/2018   MCV 93.8 02/11/2018   PLT 248.0 02/11/2018      Chemistry      Component Value Date/Time   NA 138 02/11/2018 0951   K 4.2 02/11/2018 0951   CL 106 02/11/2018 0951   CO2 28 02/11/2018 0951   BUN 14 02/11/2018 0951   CREATININE 0.65 02/11/2018 0951   CREATININE 0.72 07/10/2017 0950      Component Value Date/Time   CALCIUM 9.4 02/11/2018 0951   CALCIUM 9.2 02/11/2018 0951   ALKPHOS 31 (L) 02/11/2018 0951   AST 19 02/11/2018 0951   ALT 15 02/11/2018 0951   BILITOT 0.6 02/11/2018 0951       A/P: 1. 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 Reviewed the medication with the patient, including that change to the doctor made to citrate-free. No recommendations for changes at this time.    Christella Hartigan, PharmD, BCPS, BCACP, CPP Clinical Pharmacist Practitioner   [phone]

## 2018-08-21 MED FILL — ESTRADIOL-NORETHINDRONE ACE: 1-0.5 | 84 days supply | Qty: 84 | Fill #0

## 2018-08-21 MED FILL — HUMIRA CF 40 MG/0.4ML PSKT: 40 | 28 days supply | Qty: 4 | Fill #0

## 2018-08-29 ENCOUNTER — Other Ambulatory Visit: Payer: Self-pay | Admitting: Internal Medicine

## 2018-08-29 MED FILL — ESCITALOPRAM 10 MG TABLET: 10 | 90 days supply | Qty: 90 | Fill #0

## 2018-09-09 MED FILL — SULFAMETHOXAZOLE-TMP DS TAB: 800-160 | 14 days supply | Qty: 28 | Fill #0

## 2018-09-10 MED FILL — busPIRone HCL 7.5 MG TABS: 7.5 | 90 days supply | Qty: 180 | Fill #0

## 2018-09-18 DIAGNOSIS — F411 Generalized anxiety disorder: Secondary | ICD-10-CM | POA: Diagnosis not present

## 2018-09-23 MED FILL — CELECOXIB 100 MG CAP: 100 | 30 days supply | Qty: 60 | Fill #1

## 2018-09-25 ENCOUNTER — Other Ambulatory Visit: Payer: Self-pay

## 2018-09-25 ENCOUNTER — Other Ambulatory Visit: Payer: 59 | Admitting: Internal Medicine

## 2018-09-25 DIAGNOSIS — Z0000 Encounter for general adult medical examination without abnormal findings: Secondary | ICD-10-CM

## 2018-09-25 DIAGNOSIS — E7800 Pure hypercholesterolemia, unspecified: Secondary | ICD-10-CM | POA: Diagnosis not present

## 2018-09-25 DIAGNOSIS — F439 Reaction to severe stress, unspecified: Secondary | ICD-10-CM

## 2018-09-25 DIAGNOSIS — K50919 Crohn's disease, unspecified, with unspecified complications: Secondary | ICD-10-CM

## 2018-09-25 DIAGNOSIS — F411 Generalized anxiety disorder: Secondary | ICD-10-CM | POA: Diagnosis not present

## 2018-09-26 LAB — CBC WITH DIFFERENTIAL/PLATELET
Absolute Monocytes: 437 cells/uL (ref 200–950)
Basophils Absolute: 72 cells/uL (ref 0–200)
Basophils Relative: 1.5 %
Eosinophils Absolute: 269 cells/uL (ref 15–500)
Eosinophils Relative: 5.6 %
HCT: 39.7 % (ref 35.0–45.0)
Hemoglobin: 13.2 g/dL (ref 11.7–15.5)
Lymphs Abs: 2083 cells/uL (ref 850–3900)
MCH: 31.1 pg (ref 27.0–33.0)
MCHC: 33.2 g/dL (ref 32.0–36.0)
MCV: 93.6 fL (ref 80.0–100.0)
MPV: 10 fL (ref 7.5–12.5)
Monocytes Relative: 9.1 %
Neutro Abs: 1939 cells/uL (ref 1500–7800)
Neutrophils Relative %: 40.4 %
Platelets: 249 10*3/uL (ref 140–400)
RBC: 4.24 10*6/uL (ref 3.80–5.10)
RDW: 12.3 % (ref 11.0–15.0)
Total Lymphocyte: 43.4 %
WBC: 4.8 10*3/uL (ref 3.8–10.8)

## 2018-09-26 LAB — COMPLETE METABOLIC PANEL WITH GFR
AG Ratio: 1.6 (calc) (ref 1.0–2.5)
ALT: 18 U/L (ref 6–29)
AST: 25 U/L (ref 10–35)
Albumin: 4.6 g/dL (ref 3.6–5.1)
Alkaline phosphatase (APISO): 34 U/L — ABNORMAL LOW (ref 37–153)
BUN: 12 mg/dL (ref 7–25)
CO2: 23 mmol/L (ref 20–32)
Calcium: 9.4 mg/dL (ref 8.6–10.4)
Chloride: 103 mmol/L (ref 98–110)
Creat: 0.81 mg/dL (ref 0.50–1.05)
GFR, Est African American: 93 mL/min/{1.73_m2} (ref >=60)
GFR, Est Non African American: 80 mL/min/{1.73_m2} (ref >=60)
Globulin: 2.9 g/dL (calc) (ref 1.9–3.7)
Glucose, Bld: 84 mg/dL (ref 65–99)
Potassium: 4.4 mmol/L (ref 3.5–5.3)
Sodium: 137 mmol/L (ref 135–146)
Total Bilirubin: 0.7 mg/dL (ref 0.2–1.2)
Total Protein: 7.5 g/dL (ref 6.1–8.1)

## 2018-09-26 LAB — LIPID PANEL
Cholesterol: 240 mg/dL — ABNORMAL HIGH (ref <200)
HDL: 101 mg/dL (ref >=50)
LDL Cholesterol (Calc): 125 mg/dL (calc) — ABNORMAL HIGH
Non-HDL Cholesterol (Calc): 139 mg/dL (calc) — ABNORMAL HIGH (ref <130)
Total CHOL/HDL Ratio: 2.4 (calc) (ref <5.0)
Triglycerides: 58 mg/dL (ref <150)

## 2018-09-26 LAB — VITAMIN D 25 HYDROXY (VIT D DEFICIENCY, FRACTURES): Vit D, 25-Hydroxy: 60 ng/mL (ref 30–100)

## 2018-09-26 LAB — TSH: TSH: 2.96 mIU/L (ref 0.40–4.50)

## 2018-09-28 ENCOUNTER — Encounter: Payer: Self-pay | Admitting: Internal Medicine

## 2018-09-28 ENCOUNTER — Other Ambulatory Visit: Payer: Self-pay

## 2018-09-28 ENCOUNTER — Ambulatory Visit (INDEPENDENT_AMBULATORY_CARE_PROVIDER_SITE_OTHER): Payer: 59 | Admitting: Internal Medicine

## 2018-09-28 ENCOUNTER — Encounter

## 2018-09-28 VITALS — BP 100/60 | HR 60 | Temp 98.1°F | Ht 62.0 in | Wt 138.0 lb

## 2018-09-28 DIAGNOSIS — F439 Reaction to severe stress, unspecified: Secondary | ICD-10-CM | POA: Diagnosis not present

## 2018-09-28 DIAGNOSIS — Z8719 Personal history of other diseases of the digestive system: Secondary | ICD-10-CM

## 2018-09-28 DIAGNOSIS — F411 Generalized anxiety disorder: Secondary | ICD-10-CM

## 2018-09-28 DIAGNOSIS — J309 Allergic rhinitis, unspecified: Secondary | ICD-10-CM

## 2018-09-28 DIAGNOSIS — Z23 Encounter for immunization: Secondary | ICD-10-CM | POA: Diagnosis not present

## 2018-09-28 DIAGNOSIS — Z0000 Encounter for general adult medical examination without abnormal findings: Secondary | ICD-10-CM | POA: Diagnosis not present

## 2018-09-28 DIAGNOSIS — E7800 Pure hypercholesterolemia, unspecified: Secondary | ICD-10-CM | POA: Diagnosis not present

## 2018-09-28 LAB — POCT URINALYSIS DIPSTICK
Appearance: NEGATIVE
Bilirubin, UA: NEGATIVE
Blood, UA: NEGATIVE
Glucose, UA: NEGATIVE
Ketones, UA: NEGATIVE
Leukocytes, UA: NEGATIVE
Nitrite, UA: NEGATIVE
Odor: NEGATIVE
Protein, UA: NEGATIVE
Spec Grav, UA: 1.01 (ref 1.010–1.025)
Urobilinogen, UA: 0.2 E.U./dL
pH, UA: 6 (ref 5.0–8.0)

## 2018-09-28 NOTE — Progress Notes (Signed)
Subjective:    Patient ID: [name], female    DOB: Oct 29, 1960, 58 y.o.   MRN: [id number]  HPI 58 year old female with history of Crohn's disease followed at [facility] doing well on Humira.  She is playing a lot of tennis.  Staying in great shape.  History of allergic rhinitis and recurrent sinusitis.  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  History of situational stress and anxiety with her sister who has alcohol addiction issues.  Patient's nephew was living with she and her husband for a while but is now back in [place] with his mother but has not been doing all that well.  Patient has history of sleep apnea but not able to wear CPAP device if chronically nasally congested.  For anxiety and situational stress is taking Lexapro and BuSpar.  Has had mild elevation of LDL at 115 last year and is now 125.  Total cholesterol was 240.  However she has a very high HDL of 101 this year and previously was 78.  She and her husband who is a cardiologist think she does not need statin therapy  Social history: Married.  2 sons.  Does not smoke.  Occasionally consumes beer.  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  Husband is a Film/video editor.  Family history: Stroke and alcoholism in mother and sister.  Mother is deceased.  Patient says she never knew her real father.  And November 2017, she had right index finger radial collateral ligament injury requiring surgery by Dr. Fredna Dow.  In September 2017 she had an abscess of right fourth finger requiring surgical drainage and culture grew staph aureus sensitive to Bactrim.  History of epidermoid cyst removed from back February 2019 agrees for dermatology.    Review of Systems  Constitutional: Negative.   All other systems reviewed and are negative.      Objective:   Physical Exam Constitutional:      General: She is not in acute distress.    Appearance: Normal appearance.  HENT:     Right Ear: Tympanic  membrane normal.     Left Ear: Tympanic membrane normal.     Nose: Nose normal.     Mouth/Throat:     Pharynx: Oropharynx is clear.  Eyes:     General: No scleral icterus.       Right eye: No discharge.        Left eye: No discharge.     Extraocular Movements: Extraocular movements intact.     Conjunctiva/sclera: Conjunctivae normal.     Pupils: Pupils are equal, round, and reactive to light.  Neck:     Musculoskeletal: Neck supple. No neck rigidity.  Cardiovascular:     Rate and Rhythm: Normal rate and regular rhythm.     Pulses: Normal pulses.     Heart sounds: No murmur.     Comments: No thyromegaly Pulmonary:     Effort: Pulmonary effort is normal. No respiratory distress.     Breath sounds: Normal breath sounds. No wheezing.     Comments: Breast without masses Abdominal:     General: There is no distension.     Palpations: Abdomen is soft. There is no mass.     Tenderness: There is no abdominal tenderness. There is no guarding or rebound.  Genitourinary:    Comments: Deferred to GYN Lymphadenopathy:     Cervical: No cervical adenopathy.  Skin:    General: Skin is warm and dry.  Neurological:     General: No focal deficit present.     Mental Status: She is alert and oriented to person, place, and time.     Cranial Nerves: No cranial nerve deficit.     Motor: No weakness.     Coordination: Coordination normal.     Gait: Gait normal.  Psychiatric:        Mood and Affect: Mood normal.        Behavior: Behavior normal.        Thought Content: Thought content normal.           Assessment & Plan:  History of Crohn's disease-now doing well on Humira  Situational stress with family-he handles it well  Mild elevation of LDL cholesterol but has very high HDL and will not be started on statin therapy  History of allergic rhinitis  Plan: Continue current medications and follow-up in 1 year or as needed.  Recently has been placed on high-dose vitamin D therapy by  Dr. Gardenia Phlegm sports medicine physician.  She does have glaucoma and is on Lumigan.  Continue Lexapro and BuSpar for anxiety.  GYN has her on oral contraceptives.

## 2018-09-29 NOTE — Progress Notes (Signed)
[name] [place] [place], [place] [address] Phone: [phone] Subjective:   I [name] am serving as a Education administrator for Dr. Hulan Saas.  I  CC: Lumbar back pain  ENI:[id number]   03/11/2018 Improvement noted.  Discussed icing regimen and home exercise.  Discussed which activities of doing which wants to avoid.  Topical anti-inflammatories and compression still.  Follow-up as needed  09/30/2018 [name] is a 58 y.o. female coming in with complaint of right leg and knee pain. No numbness and tingling noted. Slight back pain due to a prior injury but patient states that she is doing well at this time.   Onset- chronic  Location - piriformis/glut. Duration-  Character- Aggravating factors- sitting in the car  Reliving factors-  Therapies tried- ice, Celebrex does cause some improvement Severity-5 out of 10.   But nothing that stops her from activity at all.  Past Medical History:  Diagnosis Date  . Anxiety   . Crohn disease ([place])   . Depression   . GERD (gastroesophageal reflux disease)   . History of methicillin resistant staphylococcus aureus (MRSA)   . Sleep apnea    CPAP   Past Surgical History:  Procedure Laterality Date  . CESAREAN SECTION    . LIGAMENT REPAIR Right 11/23/2015   Procedure: right index radial collateral LIGAMENT REPAIR;  Surgeon: Leanora Cover, MD;  Location: [facility];  Service: Orthopedics;  Laterality: Right;   Social History   Socioeconomic History  . Marital status: Married    Spouse name: Not on file  . Number of children: Not on file  . Years of education: Not on file  . Highest education level: Not on file  Occupational History  . Not on file  Social Needs  . Financial resource strain: Not on file  . Food insecurity    Worry: Not on file    Inability: Not on file  . Transportation needs    Medical: Not on file    Non-medical: Not on file  Tobacco Use  . Smoking status:  Never Smoker  . Smokeless tobacco: Never Used  Substance and Sexual Activity  . Alcohol use: Yes    Comment: 10 glasses   . Drug use: No  . Sexual activity: Yes    Birth control/protection: Pill  Lifestyle  . Physical activity    Days per week: Not on file    Minutes per session: Not on file  . Stress: Not on file  Relationships  . Social Herbalist on phone: Not on file    Gets together: Not on file    Attends religious service: Not on file    Active member of club or organization: Not on file    Attends meetings of clubs or organizations: Not on file    Relationship status: Not on file  Other Topics Concern  . Not on file  Social History Narrative  . Not on file   Allergies  Allergen Reactions  . Chlorhexidine Gluconate Itching  . Hydrocodone Itching   Family History  Problem Relation Age of Onset  . Alcohol abuse Mother   . Alcohol abuse Sister   . Angioedema Neg Hx   . Allergic rhinitis Neg Hx   . Asthma Neg Hx   . Atopy Neg Hx   . Eczema Neg Hx   . Immunodeficiency Neg Hx   . Urticaria Neg Hx     Current Outpatient Medications (  Endocrine & Metabolic):  .  levonorgestrel-ethinyl estradiol (AVIANE,ALESSE,LESSINA) 0.1-20 MG-MCG tablet, Take by mouth.  Current Outpatient Medications (Cardiovascular):  .  colestipol (COLESTID) 1 g tablet, Take 1 g by mouth 2 (two) times daily.  Current Outpatient Medications (Respiratory):  .  cetirizine (ZYRTEC) 10 MG tablet, Take by mouth.  Current Outpatient Medications (Analgesics):  Marland Kitchen  Adalimumab (HUMIRA) 40 MG/0.4ML PSKT, Inject 0.4 mLs into the skin once a week. (every 7 days)   Current Outpatient Medications (Other):  .  busPIRone (BUSPAR) 7.5 MG tablet, TAKE 1 TABLET BY MOUTH TWICE A DAY .  clotrimazole-betamethasone (LOTRISONE) cream, Apply 1 application topically 2 (two) times daily. Marland Kitchen  escitalopram (LEXAPRO) 10 MG tablet, TAKE 1 TABLET BY MOUTH DAILY. .  famotidine (PEPCID) 20 MG tablet, Take 20 mg by  mouth daily. .  fluocinonide ointment (LIDEX) 2.94 %, Apply 1 application topically daily as needed. Marland Kitchen  LUMIGAN 0.01 % SOLN, Place 1 drop into both eyes at bedtime. .  Vitamin D, Ergocalciferol, (DRISDOL) 1.25 MG (50000 UT) CAPS capsule, TAKE 1 CAPSULE BY MOUTH EVERY 7 DAYS.    Past medical history, social, surgical and family history all reviewed in electronic medical record.  No pertanent information unless stated regarding to the chief complaint.   Review of Systems:  No headache, visual changes, nausea, vomiting, diarrhea, constipation, dizziness, abdominal pain, skin rash, fevers, chills, night sweats, weight loss, swollen lymph nodes, body aches, joint swelling,  chest pain, shortness of breath, mood changes.  Positive muscle aches  Objective  Blood pressure 110/84, pulse (!) 59, height 5' 2"  (1.575 m), weight 138 lb (62.6 kg), last menstrual period 01/10/2014, SpO2 98 %.   General: No apparent distress alert and oriented x3 mood and affect normal, dressed appropriately.  HEENT: Pupils equal, extraocular movements intact  Respiratory: Patient's speak in full sentences and does not appear short of breath  Cardiovascular: No lower extremity edema, non tender, no erythema  Skin: Warm dry intact with no signs of infection or rash on extremities or on axial skeleton.  Abdomen: Soft nontender  Neuro: Cranial nerves II through XII are intact, neurovascularly intact in all extremities with 2+ DTRs and 2+ pulses.  Lymph: No lymphadenopathy of posterior or anterior cervical chain or axillae bilaterally.  Gait normal with good balance and coordination.  MSK:  tender with limited range of motion and good stability and symmetric strength and tone of shoulders, elbows, wrist, hip, knee and ankles bilaterally.  Back exam shows some mild tightness in the paraspinal musculature and tenderness to palpation over the sacroiliac joint.  Negative straight leg test.  Positive Faber test with worsening pain  in the piriformis.  Severely tender to palpation over the piriformis and the sacroiliac joint.   Osteopathic findings   T9 extended rotated and side bent left L2 flexed rotated and side bent right Sacrum right on right    Impression and Recommendations:     This case required medical decision making of moderate complexity. The above documentation has been reviewed and is accurate and complete Lyndal Pulley, DO       Note: This dictation was prepared with Dragon dictation along with smaller phrase technology. Any transcriptional errors that result from this process are unintentional.

## 2018-09-30 ENCOUNTER — Encounter: Payer: Self-pay | Admitting: Family Medicine

## 2018-09-30 ENCOUNTER — Other Ambulatory Visit: Payer: Self-pay

## 2018-09-30 ENCOUNTER — Ambulatory Visit (INDEPENDENT_AMBULATORY_CARE_PROVIDER_SITE_OTHER): Payer: 59 | Admitting: Family Medicine

## 2018-09-30 DIAGNOSIS — M999 Biomechanical lesion, unspecified: Secondary | ICD-10-CM

## 2018-09-30 DIAGNOSIS — G5701 Lesion of sciatic nerve, right lower limb: Secondary | ICD-10-CM | POA: Diagnosis not present

## 2018-09-30 NOTE — Assessment & Plan Note (Signed)
Right-sided piriformis.  Discussed icing regimen and home exercise, which activities to do which wants to avoid.  Responded fairly well to osteopathic manipulation.  Differential includes sacroiliac dysfunction as well as lumbar radiculopathy.  Worsening symptoms will consider the possibility of tendon injection.  Discussed over-the-counter medicines.  Follow-up again in 4 to 8 weeks

## 2018-09-30 NOTE — Patient Instructions (Signed)
Tennis ball in back pocket Stay active Exercise 3 times a week See me again in 4 weeks

## 2018-09-30 NOTE — Assessment & Plan Note (Signed)
Decision today to treat with OMT was based on Physical Exam  After verbal consent patient was treated with HVLA, ME, FPR techniques in  thoracic, lumbar and sacral areas  Patient tolerated the procedure well with improvement in symptoms  Patient given exercises, stretches and lifestyle modifications  See medications in patient instructions if given  Patient will follow up in 4-8 weeks

## 2018-10-09 DIAGNOSIS — F411 Generalized anxiety disorder: Secondary | ICD-10-CM | POA: Diagnosis not present

## 2018-10-09 DIAGNOSIS — Z1231 Encounter for screening mammogram for malignant neoplasm of breast: Secondary | ICD-10-CM | POA: Diagnosis not present

## 2018-10-09 LAB — HM MAMMOGRAPHY

## 2018-10-12 ENCOUNTER — Encounter: Payer: Self-pay | Admitting: Internal Medicine

## 2018-10-14 NOTE — Patient Instructions (Signed)
It was a pleasure to see you today.  Continue high-dose vitamin D per Dr. Tamala Julian.  Continue current medications and follow-up in 1 year.

## 2018-10-15 ENCOUNTER — Other Ambulatory Visit: Payer: Self-pay | Admitting: Family Medicine

## 2018-10-15 MED FILL — HUMIRA CF 40 MG/0.4ML PSKT: 40 | 28 days supply | Qty: 4 | Fill #0

## 2018-10-15 MED FILL — VIT D2 1.25 MG (50,000 UNIT: 1.25 MG | 84 days supply | Qty: 12 | Fill #0

## 2018-10-20 DIAGNOSIS — Z23 Encounter for immunization: Secondary | ICD-10-CM | POA: Diagnosis not present

## 2018-10-20 DIAGNOSIS — M545 Low back pain: Secondary | ICD-10-CM | POA: Diagnosis not present

## 2018-10-20 DIAGNOSIS — R109 Unspecified abdominal pain: Secondary | ICD-10-CM | POA: Diagnosis not present

## 2018-10-20 DIAGNOSIS — R21 Rash and other nonspecific skin eruption: Secondary | ICD-10-CM | POA: Diagnosis not present

## 2018-10-20 DIAGNOSIS — M25551 Pain in right hip: Secondary | ICD-10-CM | POA: Diagnosis not present

## 2018-10-20 DIAGNOSIS — Z5181 Encounter for therapeutic drug level monitoring: Secondary | ICD-10-CM | POA: Diagnosis not present

## 2018-10-20 DIAGNOSIS — K5010 Crohn's disease of large intestine without complications: Secondary | ICD-10-CM | POA: Diagnosis not present

## 2018-10-20 MED FILL — OSCIMIN SL 0.125 MG TABLET: 0.125 | 15 days supply | Qty: 60 | Fill #0

## 2018-10-21 ENCOUNTER — Encounter: Payer: Self-pay | Admitting: Family Medicine

## 2018-10-22 DIAGNOSIS — M545 Low back pain: Secondary | ICD-10-CM | POA: Diagnosis not present

## 2018-10-22 DIAGNOSIS — M25551 Pain in right hip: Secondary | ICD-10-CM | POA: Diagnosis not present

## 2018-10-26 DIAGNOSIS — Z5181 Encounter for therapeutic drug level monitoring: Secondary | ICD-10-CM | POA: Diagnosis not present

## 2018-10-26 DIAGNOSIS — K50919 Crohn's disease, unspecified, with unspecified complications: Secondary | ICD-10-CM | POA: Diagnosis not present

## 2018-10-27 ENCOUNTER — Encounter: Payer: Self-pay | Admitting: Internal Medicine

## 2018-10-27 ENCOUNTER — Other Ambulatory Visit: Payer: Self-pay

## 2018-10-27 ENCOUNTER — Ambulatory Visit (INDEPENDENT_AMBULATORY_CARE_PROVIDER_SITE_OTHER): Payer: 59 | Admitting: Internal Medicine

## 2018-10-27 VITALS — BP 100/70 | HR 63 | Temp 98.2°F | Ht 62.0 in | Wt 138.0 lb

## 2018-10-27 DIAGNOSIS — M25551 Pain in right hip: Secondary | ICD-10-CM | POA: Diagnosis not present

## 2018-10-27 DIAGNOSIS — M545 Low back pain: Secondary | ICD-10-CM | POA: Diagnosis not present

## 2018-10-27 DIAGNOSIS — K5010 Crohn's disease of large intestine without complications: Secondary | ICD-10-CM

## 2018-10-27 DIAGNOSIS — Z23 Encounter for immunization: Secondary | ICD-10-CM

## 2018-10-27 DIAGNOSIS — G4733 Obstructive sleep apnea (adult) (pediatric): Secondary | ICD-10-CM | POA: Diagnosis not present

## 2018-10-27 NOTE — Patient Instructions (Addendum)
Patient received a pneumococcal 23 shot IM L deltoid, AV, cma due to history of Crohn's disease

## 2018-10-27 NOTE — Progress Notes (Signed)
Pneumococcal 23 vaccine given by CMA.  Patient has chronic illness/Crohn's disease treated with Humira and warrants this vaccine.  Vital signs reviewed.

## 2018-10-28 NOTE — Progress Notes (Signed)
[name] [address] [place], [place] [address] Phone: [phone] Subjective:   I [name] am serving as a Education administrator for Dr. Hulan Saas.  I'm seeing this patient by the request  of:    CC: Right sided buttocks pain  [id number]   09/30/2018 Right-sided piriformis.  Discussed icing regimen and home exercise, which activities to do which wants to avoid.  Responded fairly well to osteopathic manipulation.  Differential includes sacroiliac dysfunction as well as lumbar radiculopathy.  Worsening symptoms will consider the possibility of tendon injection.  Discussed over-the-counter medicines.  Follow-up again in 4 to 8 weeks  10/29/2018 [name] is a 58 y.o. female coming in with complaint of right sided piriformis pain. States she is still tight.  Patient notes having more left-sided back pain.  Will pick something up.  Severe amount of pain.  Patient was doing yard work.  States that anytime she seems to extend the back seems to get worse or catches.  Denies any radiation down the legs or any numbness or tingling.    Past Medical History:  Diagnosis Date  . Anxiety   . Crohn disease ([place])   . Depression   . GERD (gastroesophageal reflux disease)   . History of methicillin resistant staphylococcus aureus (MRSA)   . Sleep apnea    CPAP   Past Surgical History:  Procedure Laterality Date  . CESAREAN SECTION    . LIGAMENT REPAIR Right 11/23/2015   Procedure: right index radial collateral LIGAMENT REPAIR;  Surgeon: Leanora Cover, MD;  Location: [facility];  Service: Orthopedics;  Laterality: Right;   Social History   Socioeconomic History  . Marital status: Married    Spouse name: Not on file  . Number of children: Not on file  . Years of education: Not on file  . Highest education level: Not on file  Occupational History  . Not on file  Social Needs  . Financial resource strain: Not on file  . Food insecurity   Worry: Not on file    Inability: Not on file  . Transportation needs    Medical: Not on file    Non-medical: Not on file  Tobacco Use  . Smoking status: Never Smoker  . Smokeless tobacco: Never Used  Substance and Sexual Activity  . Alcohol use: Yes    Comment: 10 glasses   . Drug use: No  . Sexual activity: Yes    Birth control/protection: Pill  Lifestyle  . Physical activity    Days per week: Not on file    Minutes per session: Not on file  . Stress: Not on file  Relationships  . Social Herbalist on phone: Not on file    Gets together: Not on file    Attends religious service: Not on file    Active member of club or organization: Not on file    Attends meetings of clubs or organizations: Not on file    Relationship status: Not on file  Other Topics Concern  . Not on file  Social History Narrative  . Not on file   Allergies  Allergen Reactions  . Chlorhexidine Gluconate Itching  . Hydrocodone Itching   Family History  Problem Relation Age of Onset  . Alcohol abuse Mother   . Alcohol abuse Sister   . Angioedema Neg Hx   . Allergic rhinitis Neg Hx   . Asthma Neg Hx   . Atopy Neg  Hx   . Eczema Neg Hx   . Immunodeficiency Neg Hx   . Urticaria Neg Hx     Current Outpatient Medications (Endocrine & Metabolic):  .  levonorgestrel-ethinyl estradiol (AVIANE,ALESSE,LESSINA) 0.1-20 MG-MCG tablet, Take by mouth. .  predniSONE (DELTASONE) 50 MG tablet, 1 pill a day  Current Outpatient Medications (Cardiovascular):  .  colestipol (COLESTID) 1 g tablet, Take 1 g by mouth 2 (two) times daily.  Current Outpatient Medications (Respiratory):  .  cetirizine (ZYRTEC) 10 MG tablet, Take by mouth.  Current Outpatient Medications (Analgesics):  Marland Kitchen  Adalimumab (HUMIRA) 40 MG/0.4ML PSKT, Inject 0.4 mLs into the skin once a week. (every 7 days)   Current Outpatient Medications (Other):  .  busPIRone (BUSPAR) 7.5 MG tablet, TAKE 1 TABLET BY MOUTH TWICE A DAY .   clotrimazole-betamethasone (LOTRISONE) cream, Apply 1 application topically 2 (two) times daily. Marland Kitchen  escitalopram (LEXAPRO) 10 MG tablet, TAKE 1 TABLET BY MOUTH DAILY. .  famotidine (PEPCID) 20 MG tablet, Take 20 mg by mouth daily. .  fluocinonide ointment (LIDEX) 8.33 %, Apply 1 application topically daily as needed. Marland Kitchen  LUMIGAN 0.01 % SOLN, Place 1 drop into both eyes at bedtime. .  Vitamin D, Ergocalciferol, (DRISDOL) 1.25 MG (50000 UT) CAPS capsule, TAKE 1 CAPSULE BY MOUTH EVERY 7 DAYS. Marland Kitchen  tiZANidine (ZANAFLEX) 4 MG capsule, 1 tablet at night    Past medical history, social, surgical and family history all reviewed in electronic medical record.  No pertanent information unless stated regarding to the chief complaint.   Review of Systems:  No headache, visual changes, nausea, vomiting, diarrhea, constipation, dizziness, abdominal pain, skin rash, fevers, chills, night sweats, weight loss, swollen lymph nodes, body aches, joint swelling,  chest pain, shortness of breath, mood changes.  Positive muscle aches  Objective  Blood pressure 106/80, pulse 65, height 5' 2"  (1.575 m), last menstrual period 01/10/2014, SpO2 98 %.    General: No apparent distress alert and oriented x3 mood and affect normal, dressed appropriately.  HEENT: Pupils equal, extraocular movements intact  Respiratory: Patient's speak in full sentences and does not appear short of breath  Cardiovascular: No lower extremity edema, non tender, no erythema  Skin: Warm dry intact with no signs of infection or rash on extremities or on axial skeleton.  Abdomen: Soft nontender  Neuro: Cranial nerves II through XII are intact, neurovascularly intact in all extremities with 2+ DTRs and 2+ pulses.  Lymph: No lymphadenopathy of posterior or anterior cervical chain or axillae bilaterally.  Gait normal with good balance and coordination.  MSK:  Non tender with full range of motion and good stability and symmetric strength and tone of  shoulders, elbows, wrist, hip, knee and ankles bilaterally.  Back exam noted mild loss of lordosis more than usual.  Patient has worsening pain with extension in the back of even 5 degrees.  Pain more in the paraspinal musculature lumbar spine more over the iliolumbar ligament on the left side.  Negative straight leg test.  Positive Faber on the right side.  Deep tendon reflexes intact.  5-5 strength of the lower extremities    Impression and Recommendations:     This case required medical decision making of moderate complexity. The above documentation has been reviewed and is accurate and complete Lyndal Pulley, DO       Note: This dictation was prepared with Dragon dictation along with smaller phrase technology. Any transcriptional errors that result from this process are unintentional.

## 2018-10-29 ENCOUNTER — Encounter: Payer: Self-pay | Admitting: Family Medicine

## 2018-10-29 ENCOUNTER — Ambulatory Visit (INDEPENDENT_AMBULATORY_CARE_PROVIDER_SITE_OTHER): Payer: 59 | Admitting: Family Medicine

## 2018-10-29 ENCOUNTER — Other Ambulatory Visit: Payer: Self-pay

## 2018-10-29 DIAGNOSIS — M545 Low back pain: Secondary | ICD-10-CM | POA: Diagnosis not present

## 2018-10-29 DIAGNOSIS — S338XXA Sprain of other parts of lumbar spine and pelvis, initial encounter: Secondary | ICD-10-CM

## 2018-10-29 DIAGNOSIS — Z6824 Body mass index (BMI) 24.0-24.9, adult: Secondary | ICD-10-CM | POA: Diagnosis not present

## 2018-10-29 DIAGNOSIS — Z1151 Encounter for screening for human papillomavirus (HPV): Secondary | ICD-10-CM | POA: Diagnosis not present

## 2018-10-29 DIAGNOSIS — Z01419 Encounter for gynecological examination (general) (routine) without abnormal findings: Secondary | ICD-10-CM | POA: Diagnosis not present

## 2018-10-29 DIAGNOSIS — M25551 Pain in right hip: Secondary | ICD-10-CM | POA: Diagnosis not present

## 2018-10-29 MED ORDER — PREDNISONE 50 MG PO TABS: ORAL_TABLET | ORAL | 0 refills | Status: DC

## 2018-10-29 MED ORDER — TIZANIDINE HCL 4 MG PO CAPS: ORAL_CAPSULE | ORAL | 0 refills | Status: DC

## 2018-10-29 MED FILL — tiZANidine HCL 4 MG TABS: 4 | 30 days supply | Qty: 30 | Fill #0

## 2018-10-29 MED FILL — predniSONE 50 MG TABS: 50 | 5 days supply | Qty: 5 | Fill #0

## 2018-10-29 NOTE — Assessment & Plan Note (Signed)
Patient has more of an iliolumbar ligament strain.  Discussed with patient in great length, discussed icing regimen and home exercise.  Prednisone for short course, attempted manipulation previously but will hold today secondary to increasing discomfort and pain.  Discussed the anti-inflammatories, patient is to increase activity slowly.  Patient will follow-up with me again 2 to 3 weeks for further evaluation and treatment likely restart manipulation consider the possibility for injection in the piriformis.

## 2018-10-29 NOTE — Patient Instructions (Addendum)
Good to see you iliolumbar ligament injury Exercise 3 times a week Avoid extension Prednisone for 5 days  Zanaflex at night When done with prednisone start duexis 3 times a day for 3 days See me again in 3 weeks

## 2018-11-04 MED FILL — ESTRADIOL-NORETHINDRONE ACE: 1-0.5 | 84 days supply | Qty: 84 | Fill #0

## 2018-11-10 ENCOUNTER — Other Ambulatory Visit: Payer: Self-pay

## 2018-11-10 ENCOUNTER — Encounter: Payer: Self-pay | Admitting: Family Medicine

## 2018-11-10 ENCOUNTER — Ambulatory Visit (INDEPENDENT_AMBULATORY_CARE_PROVIDER_SITE_OTHER): Payer: 59 | Admitting: Family Medicine

## 2018-11-10 VITALS — BP 92/60 | HR 65 | Ht 62.0 in | Wt 139.0 lb

## 2018-11-10 DIAGNOSIS — S338XXD Sprain of other parts of lumbar spine and pelvis, subsequent encounter: Secondary | ICD-10-CM

## 2018-11-10 DIAGNOSIS — M999 Biomechanical lesion, unspecified: Secondary | ICD-10-CM | POA: Diagnosis not present

## 2018-11-10 NOTE — Patient Instructions (Signed)
I expect you to win in straight sets See me again in 3-4 weeks

## 2018-11-10 NOTE — Assessment & Plan Note (Signed)
Sprain noted, discussed icing regimen and home exercise, discussed which activities to do which wants to avoid.  Patient will increase activity slowly over the course of several weeks.

## 2018-11-10 NOTE — Assessment & Plan Note (Signed)
Decision today to treat with OMT was based on Physical Exam  After verbal consent patient was treated with HVLA, ME, FPR techniques in  thoracic, lumbar and sacral areas  Patient tolerated the procedure well with improvement in symptoms  Patient given exercises, stretches and lifestyle modifications  See medications in patient instructions if given  Patient will follow up in 4-8 weeks 

## 2018-11-10 NOTE — Progress Notes (Signed)
[name] [place], [place] [address] Phone: [phone] Subjective:   [name], am serving as a scribe for Dr. Hulan Saas.   CC: Low back pain  [id number]   10/29/2018 Patient has more of an iliolumbar ligament strain.  Discussed with patient in great length, discussed icing regimen and home exercise.  Prednisone for short course, attempted manipulation previously but will hold today secondary to increasing discomfort and pain.  Discussed the anti-inflammatories, patient is to increase activity slowly.  Patient will follow-up with me again 2 to 3 weeks for further evaluation and treatment likely restart manipulation consider the possibility for injection in the piriformis.  Update 11/10/2018 [name] is a 58 y.o. female coming in with complaint of back pain. Is doing much better than last visit. Was able to hike 5 miles and stretching helped to alleviate any pain that she had. Has not tried to play tennis but plans on trying today.  Patient would state that she is feeling 90% better.  Noticing more of the discomfort in the right lower portion of the back.    Past Medical History:  Diagnosis Date  . Anxiety   . Crohn disease ([place])   . Depression   . GERD (gastroesophageal reflux disease)   . History of methicillin resistant staphylococcus aureus (MRSA)   . Sleep apnea    CPAP   Past Surgical History:  Procedure Laterality Date  . CESAREAN SECTION    . LIGAMENT REPAIR Right 11/23/2015   Procedure: right index radial collateral LIGAMENT REPAIR;  Surgeon: Leanora Cover, MD;  Location: [facility];  Service: Orthopedics;  Laterality: Right;   Social History   Socioeconomic History  . Marital status: Married    Spouse name: Not on file  . Number of children: Not on file  . Years of education: Not on file  . Highest education level: Not on file  Occupational History  . Not on file  Social Needs  .  Financial resource strain: Not on file  . Food insecurity    Worry: Not on file    Inability: Not on file  . Transportation needs    Medical: Not on file    Non-medical: Not on file  Tobacco Use  . Smoking status: Never Smoker  . Smokeless tobacco: Never Used  Substance and Sexual Activity  . Alcohol use: Yes    Comment: 10 glasses   . Drug use: [name]  . Sexual activity: Yes    Birth control/protection: Pill  Lifestyle  . Physical activity    Days per week: Not on file    Minutes per session: Not on file  . Stress: Not on file  Relationships  . Social Herbalist on phone: Not on file    Gets together: Not on file    Attends religious service: Not on file    Active member of club or organization: Not on file    Attends meetings of clubs or organizations: Not on file    Relationship status: Not on file  Other Topics Concern  . Not on file  Social History Narrative  . Not on file   Allergies  Allergen Reactions  . Chlorhexidine Gluconate Itching  . Hydrocodone Itching   Family History  Problem Relation Age of Onset  . Alcohol abuse Mother   . Alcohol abuse Sister   . Angioedema Neg Hx   . Allergic rhinitis Neg Hx   .  Asthma Neg Hx   . Atopy Neg Hx   . Eczema Neg Hx   . Immunodeficiency Neg Hx   . Urticaria Neg Hx     Current Outpatient Medications (Endocrine & Metabolic):  .  levonorgestrel-ethinyl estradiol (AVIANE,ALESSE,LESSINA) 0.1-20 MG-MCG tablet, Take by mouth. .  predniSONE (DELTASONE) 50 MG tablet, 1 pill a day  Current Outpatient Medications (Cardiovascular):  .  colestipol (COLESTID) 1 g tablet, Take 1 g by mouth 2 (two) times daily.  Current Outpatient Medications (Respiratory):  .  cetirizine (ZYRTEC) 10 MG tablet, Take by mouth.  Current Outpatient Medications (Analgesics):  Marland Kitchen  Adalimumab (HUMIRA) 40 MG/0.4ML PSKT, Inject 0.4 mLs into the skin once a week. (every 7 days)   Current Outpatient Medications (Other):  .  busPIRone  (BUSPAR) 7.5 MG tablet, TAKE 1 TABLET BY MOUTH TWICE A DAY .  clotrimazole-betamethasone (LOTRISONE) cream, Apply 1 application topically 2 (two) times daily. Marland Kitchen  escitalopram (LEXAPRO) 10 MG tablet, TAKE 1 TABLET BY MOUTH DAILY. .  famotidine (PEPCID) 20 MG tablet, Take 20 mg by mouth daily. .  fluocinonide ointment (LIDEX) AB-123456789 %, Apply 1 application topically daily as needed. Marland Kitchen  LUMIGAN 0.01 % SOLN, Place 1 drop into both eyes at bedtime. Marland Kitchen  tiZANidine (ZANAFLEX) 4 MG capsule, 1 tablet at night .  Vitamin D, Ergocalciferol, (DRISDOL) 1.25 MG (50000 UT) CAPS capsule, TAKE 1 CAPSULE BY MOUTH EVERY 7 DAYS.    Past medical history, social, surgical and family history all reviewed in electronic medical record.  [name] pertanent information unless stated regarding to the chief complaint.   Review of Systems:  [name] headache, visual changes, nausea, vomiting, diarrhea, constipation, dizziness, abdominal pain, skin rash, fevers, chills, night sweats, weight loss, swollen lymph nodes, body aches, joint swelling, muscle aches, chest pain, shortness of breath, mood changes.   Objective  Blood pressure 92/60, pulse 65, height 5\' 2"  (1.575 m), weight 139 lb (63 kg), last menstrual period 01/10/2014, SpO2 97 %.   General: [name] apparent distress alert and oriented x3 mood and affect normal, dressed appropriately.  HEENT: Pupils equal, extraocular movements intact  Respiratory: Patient's speak in full sentences and does not appear short of breath  Cardiovascular: [name] lower extremity edema, non tender, [name] erythema  Skin: Warm dry intact with [name] signs of infection or rash on extremities or on axial skeleton.  Abdomen: Soft nontender  Neuro: Cranial nerves II through XII are intact, neurovascularly intact in all extremities with 2+ DTRs and 2+ pulses.  Lymph: [name] lymphadenopathy of posterior or anterior cervical chain or axillae bilaterally.  Gait normal with good balance and coordination.  MSK:  Non tender with  full range of motion and good stability and symmetric strength and tone of shoulders, elbows, wrist, hip, knee and ankles bilaterally.  Low back exam shows some mild decrease in lordosis.  Patient is tender more over the paraspinal musculature and iliolumbar ligament still noted.  Patient has more pain over the sacroiliac joint as well.  Negative straight leg test, negative FABER test today.  Osteopathic findings T11 extended rotated and side bent left L2 flexed rotated and side bent right Sacrum right on right     Impression and Recommendations:     This case required medical decision making of moderate complexity. The above documentation has been reviewed and is accurate and complete Lyndal Pulley, DO       Note: This dictation was prepared with Dragon dictation along with smaller phrase technology. Any transcriptional errors that  result from this process are unintentional.

## 2018-11-12 ENCOUNTER — Other Ambulatory Visit: Payer: 59 | Admitting: Internal Medicine

## 2018-11-12 DIAGNOSIS — M25551 Pain in right hip: Secondary | ICD-10-CM | POA: Diagnosis not present

## 2018-11-12 DIAGNOSIS — M545 Low back pain: Secondary | ICD-10-CM | POA: Diagnosis not present

## 2018-11-16 ENCOUNTER — Other Ambulatory Visit: Payer: Self-pay | Admitting: Internal Medicine

## 2018-11-16 MED FILL — COLESTIPOL HCL 1 GM TABLET: 1 | 30 days supply | Qty: 180 | Fill #0

## 2018-11-16 MED FILL — LUMIGAN 0.01% EYE DROPS: 0.01 | 25 days supply | Qty: 3 | Fill #1

## 2018-11-16 MED FILL — ESCITALOPRAM 10 MG TABLET: 10 | 90 days supply | Qty: 90 | Fill #0

## 2018-11-16 MED FILL — ESTRADIOL-NORETHINDRONE ACE: 1-0.5 | 84 days supply | Qty: 84 | Fill #0

## 2018-11-17 ENCOUNTER — Encounter: Payer: 59 | Admitting: Internal Medicine

## 2018-11-23 MED FILL — CELECOXIB 100 MG CAP: 100 | 30 days supply | Qty: 60 | Fill #0

## 2018-11-26 DIAGNOSIS — M545 Low back pain: Secondary | ICD-10-CM | POA: Diagnosis not present

## 2018-11-26 DIAGNOSIS — M25551 Pain in right hip: Secondary | ICD-10-CM | POA: Diagnosis not present

## 2018-12-07 DIAGNOSIS — M25551 Pain in right hip: Secondary | ICD-10-CM | POA: Diagnosis not present

## 2018-12-07 DIAGNOSIS — M545 Low back pain: Secondary | ICD-10-CM | POA: Diagnosis not present

## 2018-12-09 ENCOUNTER — Ambulatory Visit (INDEPENDENT_AMBULATORY_CARE_PROVIDER_SITE_OTHER): Payer: 59 | Admitting: Family Medicine

## 2018-12-09 ENCOUNTER — Encounter: Payer: Self-pay | Admitting: Family Medicine

## 2018-12-09 ENCOUNTER — Other Ambulatory Visit: Payer: Self-pay

## 2018-12-09 VITALS — BP 104/62 | HR 77 | Ht 62.0 in | Wt 142.0 lb

## 2018-12-09 DIAGNOSIS — M999 Biomechanical lesion, unspecified: Secondary | ICD-10-CM | POA: Diagnosis not present

## 2018-12-09 DIAGNOSIS — G5701 Lesion of sciatic nerve, right lower limb: Secondary | ICD-10-CM

## 2018-12-09 NOTE — Assessment & Plan Note (Signed)
Decision today to treat with OMT was based on Physical Exam  After verbal consent patient was treated with HVLA, ME, FPR techniques in  thoracic, lumbar and sacral areas  Patient tolerated the procedure well with improvement in symptoms  Patient given exercises, stretches and lifestyle modifications  See medications in patient instructions if given  Patient will follow up in 4-8 weeks 

## 2018-12-09 NOTE — Patient Instructions (Signed)
Heel lift in the toe of the shoe Continue the exercises Spenco orthotics "total support" See me again in 4 weeks

## 2018-12-09 NOTE — Assessment & Plan Note (Signed)
History of piriformis syndrome with concern for potentially more of a lumbar radiculopathy.  We discussed which activities to do which wants to avoid.  Discussed the possibility of gabapentin.  Patient wants to hold at this moment.  Has a muscle relaxer.  We will monitor for patient having the weakness.  Patient will follow up with me again 4 weeks

## 2018-12-09 NOTE — Progress Notes (Signed)
[name] [name], [place] [address] Phone: [phone] Subjective:   [name], am serving as a scribe for Dr. Hulan Saas.   This visit occurred during the SARS-CoV-2 public health emergency.  Safety protocols were in place, including screening questions prior to the visit, additional usage of staff PPE, and extensive cleaning of exam room while observing appropriate contact time as indicated for disinfecting solutions.     CC: Low back pain follow-up  [id number]   11/10/2018 Sprain noted, discussed icing regimen and home exercise, discussed which activities to do which wants to avoid.  Patient will increase activity slowly over the course of several weeks.  Update 12/09/2018 [name] is a 58 y.o. female coming in with complaint of back pain. Did have OMT on 11/10/2018. Patient states  Has been doing relatively well.  Some mild increase in discomfort.  Has been doing relatively better.  Still having back pain on the right side.  No true weakness that she is noted.    Past Medical History:  Diagnosis Date  . Anxiety   . Crohn disease ([place])   . Depression   . GERD (gastroesophageal reflux disease)   . History of methicillin resistant staphylococcus aureus (MRSA)   . Sleep apnea    CPAP   Past Surgical History:  Procedure Laterality Date  . CESAREAN SECTION    . LIGAMENT REPAIR Right 11/23/2015   Procedure: right index radial collateral LIGAMENT REPAIR;  Surgeon: Leanora Cover, MD;  Location: [facility];  Service: Orthopedics;  Laterality: Right;   Social History   Socioeconomic History  . Marital status: Married    Spouse name: Not on file  . Number of children: Not on file  . Years of education: Not on file  . Highest education level: Not on file  Occupational History  . Not on file  Social Needs  . Financial resource strain: Not on file  . Food insecurity    Worry: Not on file     Inability: Not on file  . Transportation needs    Medical: Not on file    Non-medical: Not on file  Tobacco Use  . Smoking status: Never Smoker  . Smokeless tobacco: Never Used  Substance and Sexual Activity  . Alcohol use: Yes    Comment: 10 glasses   . Drug use: No  . Sexual activity: Yes    Birth control/protection: Pill  Lifestyle  . Physical activity    Days per week: Not on file    Minutes per session: Not on file  . Stress: Not on file  Relationships  . Social Herbalist on phone: Not on file    Gets together: Not on file    Attends religious service: Not on file    Active member of club or organization: Not on file    Attends meetings of clubs or organizations: Not on file    Relationship status: Not on file  Other Topics Concern  . Not on file  Social History Narrative  . Not on file   Allergies  Allergen Reactions  . Chlorhexidine Gluconate Itching  . Hydrocodone Itching   Family History  Problem Relation Age of Onset  . Alcohol abuse Mother   . Alcohol abuse Sister   . Angioedema Neg Hx   . Allergic rhinitis Neg Hx   . Asthma Neg Hx   . Atopy Neg Hx   . Eczema  Neg Hx   . Immunodeficiency Neg Hx   . Urticaria Neg Hx     Current Outpatient Medications (Endocrine & Metabolic):  .  levonorgestrel-ethinyl estradiol (AVIANE,ALESSE,LESSINA) 0.1-20 MG-MCG tablet, Take by mouth. .  predniSONE (DELTASONE) 50 MG tablet, 1 pill a day  Current Outpatient Medications (Cardiovascular):  .  colestipol (COLESTID) 1 g tablet, Take 1 g by mouth 2 (two) times daily.  Current Outpatient Medications (Respiratory):  .  cetirizine (ZYRTEC) 10 MG tablet, Take by mouth.  Current Outpatient Medications (Analgesics):  Marland Kitchen  Adalimumab (HUMIRA) 40 MG/0.4ML PSKT, Inject 0.4 mLs into the skin once a week. (every 7 days)   Current Outpatient Medications (Other):  .  busPIRone (BUSPAR) 7.5 MG tablet, TAKE 1 TABLET BY MOUTH TWICE A DAY .  clotrimazole-betamethasone  (LOTRISONE) cream, Apply 1 application topically 2 (two) times daily. Marland Kitchen  escitalopram (LEXAPRO) 10 MG tablet, TAKE 1 TABLET BY MOUTH DAILY. .  famotidine (PEPCID) 20 MG tablet, Take 20 mg by mouth daily. .  fluocinonide ointment (LIDEX) AB-123456789 %, Apply 1 application topically daily as needed. Marland Kitchen  LUMIGAN 0.01 % SOLN, Place 1 drop into both eyes at bedtime. Marland Kitchen  tiZANidine (ZANAFLEX) 4 MG capsule, 1 tablet at night .  Vitamin D, Ergocalciferol, (DRISDOL) 1.25 MG (50000 UT) CAPS capsule, TAKE 1 CAPSULE BY MOUTH EVERY 7 DAYS.    Past medical history, social, surgical and family history all reviewed in electronic medical record.  No pertanent information unless stated regarding to the chief complaint.   Review of Systems:  No headache, visual changes, nausea, vomiting, diarrhea, constipation, dizziness, abdominal pain, skin rash, fevers, chills, night sweats, weight loss, swollen lymph nodes, body aches, joint swelling, muscle aches, chest pain, shortness of breath, mood changes.   Objective  Blood pressure 104/62, pulse 77, height 5\' 2"  (1.575 m), weight 142 lb (64.4 kg), last menstrual period 01/10/2014, SpO2 97 %.    General: No apparent distress alert and oriented x3 mood and affect normal, dressed appropriately.  HEENT: Pupils equal, extraocular movements intact  Respiratory: Patient's speak in full sentences and does not appear short of breath  Cardiovascular: No lower extremity edema, non tender, no erythema  Skin: Warm dry intact with no signs of infection or rash on extremities or on axial skeleton.  Abdomen: Soft nontender  Neuro: Cranial nerves II through XII are intact, neurovascularly intact in all extremities with 2+ DTRs and 2+ pulses.  Lymph: No lymphadenopathy of posterior or anterior cervical chain or axillae bilaterally.  Gait normal with good balance and coordination.  MSK:  Non tender with full range of motion and good stability and symmetric strength and tone of shoulders,  elbows, wrist, hip, knee and ankles bilaterally.  Back exam does have some loss of lordosis.  Mild positive FABER test.  Tightness of the hamstring but no radicular symptoms.  4-5 strength with dorsi flexion compared to the contralateral side.  Deep tendon reflexes intact.  Osteopathic findings  C4 flexed rotated and side bent left T5 extended rotated and side bent left L1 flexed rotated and side bent right Sacrum right on right    Impression and Recommendations:     This case required medical decision making of moderate complexity. The above documentation has been reviewed and is accurate and complete Lyndal Pulley, DO       Note: This dictation was prepared with Dragon dictation along with smaller phrase technology. Any transcriptional errors that result from this process are unintentional.

## 2018-12-11 ENCOUNTER — Other Ambulatory Visit: Payer: Self-pay | Admitting: Family Medicine

## 2018-12-22 ENCOUNTER — Other Ambulatory Visit: Payer: Self-pay | Admitting: Internal Medicine

## 2018-12-22 MED FILL — busPIRone HCL 7.5 MG TABS: 7.5 | 90 days supply | Qty: 180 | Fill #0

## 2019-01-01 DIAGNOSIS — M25551 Pain in right hip: Secondary | ICD-10-CM | POA: Diagnosis not present

## 2019-01-01 DIAGNOSIS — M545 Low back pain: Secondary | ICD-10-CM | POA: Diagnosis not present

## 2019-01-06 ENCOUNTER — Ambulatory Visit: Payer: 59 | Admitting: Family Medicine

## 2019-01-11 ENCOUNTER — Other Ambulatory Visit: Payer: Self-pay | Admitting: Family Medicine

## 2019-01-12 MED FILL — VIT D2 1.25 MG (50,000 UNIT: 1.25 MG | 84 days supply | Qty: 12 | Fill #0

## 2019-01-13 DIAGNOSIS — M25551 Pain in right hip: Secondary | ICD-10-CM | POA: Diagnosis not present

## 2019-01-13 DIAGNOSIS — M545 Low back pain: Secondary | ICD-10-CM | POA: Diagnosis not present

## 2019-01-19 DIAGNOSIS — G4733 Obstructive sleep apnea (adult) (pediatric): Secondary | ICD-10-CM | POA: Diagnosis not present

## 2019-01-19 MED FILL — HUMIRA CF 40 MG/0.4ML PSKT: 40 | 28 days supply | Qty: 4 | Fill #0

## 2019-01-19 MED FILL — COLESTIPOL HCL 1 GM TABLET: 1 | 30 days supply | Qty: 180 | Fill #1

## 2019-01-19 MED FILL — CELECOXIB 100 MG CAP: 100 | 30 days supply | Qty: 60 | Fill #1

## 2019-01-21 DIAGNOSIS — M545 Low back pain: Secondary | ICD-10-CM | POA: Diagnosis not present

## 2019-01-21 DIAGNOSIS — M25551 Pain in right hip: Secondary | ICD-10-CM | POA: Diagnosis not present

## 2019-01-29 DIAGNOSIS — M25551 Pain in right hip: Secondary | ICD-10-CM | POA: Diagnosis not present

## 2019-01-29 DIAGNOSIS — M545 Low back pain: Secondary | ICD-10-CM | POA: Diagnosis not present

## 2019-02-11 ENCOUNTER — Encounter: Payer: Self-pay | Admitting: Family Medicine

## 2019-02-11 ENCOUNTER — Other Ambulatory Visit: Payer: Self-pay

## 2019-02-11 ENCOUNTER — Ambulatory Visit (INDEPENDENT_AMBULATORY_CARE_PROVIDER_SITE_OTHER): Payer: 59 | Admitting: Family Medicine

## 2019-02-11 VITALS — BP 100/70 | HR 68 | Ht 62.0 in | Wt 145.0 lb

## 2019-02-11 DIAGNOSIS — M216X9 Other acquired deformities of unspecified foot: Secondary | ICD-10-CM | POA: Diagnosis not present

## 2019-02-11 DIAGNOSIS — G5701 Lesion of sciatic nerve, right lower limb: Secondary | ICD-10-CM

## 2019-02-11 DIAGNOSIS — M999 Biomechanical lesion, unspecified: Secondary | ICD-10-CM | POA: Diagnosis not present

## 2019-02-11 DIAGNOSIS — M216X1 Other acquired deformities of right foot: Secondary | ICD-10-CM | POA: Insufficient documentation

## 2019-02-11 NOTE — Assessment & Plan Note (Signed)
Continue on the tightness.  Discussed which activities to do which wants to avoid.  Discussed hip abductor strengthening.  Chronic problem but seems to be stable.  Patient encouraged to continue the home exercises and icing regimen.  Follow-up again in 4 to 8 weeks

## 2019-02-11 NOTE — Assessment & Plan Note (Signed)
Decision today to treat with OMT was based on Physical Exam  After verbal consent patient was treated with HVLA, ME, FPR techniques in  thoracic, lumbar and sacral areas  Patient tolerated the procedure well with improvement in symptoms  Patient given exercises, stretches and lifestyle modifications  See medications in patient instructions if given  Patient will follow up in 4-8 weeks

## 2019-02-11 NOTE — Progress Notes (Signed)
[name] Phone: [phone] Subjective:   I [name] am serving as a Education administrator for Dr. Hulan [name].  This visit occurred during the SARS-CoV-2 public health emergency.  Safety protocols were in place, including screening questions prior to the visit, additional usage of staff PPE, and extensive cleaning of exam room while observing appropriate contact time as indicated for disinfecting solutions.   I'm seeing this patient by the request  of:  [name], Stacey Lick, MD  CC: Low back and foot pain follow-up  [id number]  [name] is a 59 y.o. female coming in with complaint of back pain. Patient states she is feeling a lot better. Right foot is still painful.  Low back pain still some mild discomfort from time to time and some tightness noted.  Nothing that stops her from activity.  Foot pain has some numbness with it.  Patient states it is more of a tingling sensation.     Past Medical History:  Diagnosis Date  . Anxiety   . Crohn disease ([place])   . Depression   . GERD (gastroesophageal reflux disease)   . History of methicillin resistant staphylococcus aureus (MRSA)   . Sleep apnea    CPAP   Past Surgical History:  Procedure Laterality Date  . CESAREAN SECTION    . LIGAMENT REPAIR Right 11/23/2015   Procedure: right index radial collateral LIGAMENT REPAIR;  Surgeon: Leanora Cover, MD;  Location: [facility];  Service: Orthopedics;  Laterality: Right;   Social History   Socioeconomic History  . Marital status: Married    Spouse name: Not on file  . Number of children: Not on file  . Years of education: Not on file  . Highest education level: Not on file  Occupational History  . Not on file  Tobacco Use  . Smoking status: Never Smoker  . Smokeless tobacco: Never Used  Substance and Sexual Activity  . Alcohol use: Yes    Comment: 10 glasses   . Drug use: No  . Sexual activity:  Yes    Birth control/protection: Pill  Other Topics Concern  . Not on file  Social History Narrative  . Not on file   Social Determinants of Health   Financial Resource Strain:   . Difficulty of Paying Living Expenses: Not on file  Food Insecurity:   . Worried About Charity fundraiser in the Last Year: Not on file  . Ran Out of Food in the Last Year: Not on file  Transportation Needs:   . Lack of Transportation (Medical): Not on file  . Lack of Transportation (Non-Medical): Not on file  Physical Activity:   . Days of Exercise per Week: Not on file  . Minutes of Exercise per Session: Not on file  Stress:   . Feeling of Stress : Not on file  Social Connections:   . Frequency of Communication with Friends and Family: Not on file  . Frequency of Social Gatherings with Friends and Family: Not on file  . Attends Religious Services: Not on file  . Active Member of Clubs or Organizations: Not on file  . Attends Archivist Meetings: Not on file  . Marital Status: Not on file   Allergies  Allergen Reactions  . Chlorhexidine Gluconate Itching  . Hydrocodone Itching   Family History  Problem Relation Age of Onset  . Alcohol abuse Mother   . Alcohol abuse Sister   .  Angioedema Neg Hx   . Allergic rhinitis Neg Hx   . Asthma Neg Hx   . Atopy Neg Hx   . Eczema Neg Hx   . Immunodeficiency Neg Hx   . Urticaria Neg Hx     Current Outpatient Medications (Endocrine & Metabolic):  .  levonorgestrel-ethinyl estradiol (AVIANE,ALESSE,LESSINA) 0.1-20 MG-MCG tablet, Take by mouth. .  predniSONE (DELTASONE) 50 MG tablet, 1 pill a day  Current Outpatient Medications (Cardiovascular):  .  colestipol (COLESTID) 1 g tablet, Take 1 g by mouth 2 (two) times daily.  Current Outpatient Medications (Respiratory):  .  cetirizine (ZYRTEC) 10 MG tablet, Take by mouth.  Current Outpatient Medications (Analgesics):  Marland Kitchen  Adalimumab (HUMIRA) 40 MG/0.4ML PSKT, Inject 0.4 mLs into the skin  once a week. (every 7 days)   Current Outpatient Medications (Other):  .  busPIRone (BUSPAR) 7.5 MG tablet, TAKE 1 TABLET BY MOUTH TWICE A DAY .  clotrimazole-betamethasone (LOTRISONE) cream, Apply 1 application topically 2 (two) times daily. Marland Kitchen  escitalopram (LEXAPRO) 10 MG tablet, TAKE 1 TABLET BY MOUTH DAILY. .  famotidine (PEPCID) 20 MG tablet, Take 20 mg by mouth daily. .  fluocinonide ointment (LIDEX) 6.16 %, Apply 1 application topically daily as needed. Marland Kitchen  LUMIGAN 0.01 % SOLN, Place 1 drop into both eyes at bedtime. Marland Kitchen  tiZANidine (ZANAFLEX) 4 MG tablet, TAKE 1 TABLET BY MOUTH AT NIGHT .  Vitamin D, Ergocalciferol, (DRISDOL) 1.25 MG (50000 UT) CAPS capsule, TAKE 1 CAPSULE BY MOUTH EVERY 7 DAYS.   Reviewed prior external information including notes and imaging from  primary care provider As well as notes that were available from care everywhere and other healthcare systems.  Past medical history, social, surgical and family history all reviewed in electronic medical record.  No pertanent information unless stated regarding to the chief complaint.   Review of Systems:  No headache, visual changes, nausea, vomiting, diarrhea, constipation, dizziness, abdominal pain, skin rash, fevers, chills, night sweats, weight loss, swollen lymph nodes, body aches, joint swelling, chest pain, shortness of breath, mood changes. POSITIVE muscle aches  Objective  Blood pressure 100/70, pulse 68, height 5' 2"  (1.575 m), weight 145 lb (65.8 kg), last menstrual period 01/10/2014, SpO2 98 %.   General: No apparent distress alert and oriented x3 mood and affect normal, dressed appropriately.  HEENT: Pupils equal, extraocular movements intact  Respiratory: Patient's speak in full sentences and does not appear short of breath  Cardiovascular: No lower extremity edema, non tender, no erythema  Skin: Warm dry intact with no signs of infection or rash on extremities or on axial skeleton.  Abdomen: Soft  nontender  Neuro: Cranial nerves II through XII are intact, neurovascularly intact in all extremities with 2+ DTRs and 2+ pulses.  Lymph: No lymphadenopathy of posterior or anterior cervical chain or axillae bilaterally.  Gait normal with good balance and coordination.  MSK:  Non tender with full range of motion and good stability and symmetric strength and tone of shoulders, elbows, wrist, hip, knee bilaterally.  Low back exam does have some mild loss of lordosis.  Patient does have some tenderness to palpation.  Patient's pain seems to be sacroiliac joint right greater than left.  Mild positive Faber, negative straight leg test.  Foot exam does have some breakdown of the transverse arch.  Mild bunion and bunionette formation.  Positive squeeze test.  Osteopathic findings C2 flexed rotated and side bent right T9 extended rotated and side bent left L2 flexed rotated and side  bent right Sacrum right on right    Impression and Recommendations:     This case required medical decision making of moderate complexity. The above documentation has been reviewed and is accurate and complete Lyndal Pulley, DO       Note: This dictation was prepared with Dragon dictation along with smaller phrase technology. Any transcriptional errors that result from this process are unintentional.

## 2019-02-11 NOTE — Assessment & Plan Note (Signed)
Patient does have some breakdown the transverse arch.  Some neuroma could be a possibility.  Discussed possible need for injection, metatarsal pads, change patient's insoles today.  Follow-up again in 4 to 8 weeks

## 2019-02-11 NOTE — Patient Instructions (Addendum)
Good to see you Exercise 3 times a week [place] ah[place] Don't lace last eye See me again in 5-6 weeks

## 2019-02-16 ENCOUNTER — Other Ambulatory Visit: Payer: Self-pay | Admitting: Pharmacist

## 2019-02-16 MED ORDER — HUMIRA (2 SYRINGE) 40 MG/0.4ML ~~LOC~~ PSKT: 0.4000 mL | PREFILLED_SYRINGE | SUBCUTANEOUS | 2 refills | Status: DC

## 2019-02-19 MED FILL — ESTRADIOL-NORETHINDRONE ACE: 1-0.5 | 84 days supply | Qty: 84 | Fill #1

## 2019-03-02 MED FILL — ESCITALOPRAM 10 MG TABLET: 10 | 90 days supply | Qty: 90 | Fill #1

## 2019-03-15 MED FILL — HUMIRA CF 40 MG/0.4ML PSKT: 40 | 28 days supply | Qty: 2 | Fill #0

## 2019-03-19 ENCOUNTER — Ambulatory Visit (INDEPENDENT_AMBULATORY_CARE_PROVIDER_SITE_OTHER): Payer: 59 | Admitting: Family Medicine

## 2019-03-19 ENCOUNTER — Encounter: Payer: Self-pay | Admitting: Family Medicine

## 2019-03-19 ENCOUNTER — Other Ambulatory Visit: Payer: Self-pay

## 2019-03-19 VITALS — BP 114/82 | HR 60 | Ht 62.0 in | Wt 138.0 lb

## 2019-03-19 DIAGNOSIS — M999 Biomechanical lesion, unspecified: Secondary | ICD-10-CM

## 2019-03-19 DIAGNOSIS — G5701 Lesion of sciatic nerve, right lower limb: Secondary | ICD-10-CM | POA: Diagnosis not present

## 2019-03-19 DIAGNOSIS — S338XXD Sprain of other parts of lumbar spine and pelvis, subsequent encounter: Secondary | ICD-10-CM | POA: Diagnosis not present

## 2019-03-19 NOTE — Patient Instructions (Signed)
See me again in 5-6 weeks Continue everything Doing better though

## 2019-03-19 NOTE — Assessment & Plan Note (Signed)
Mild tightness noted.  Will discuss with patient about icing regimen and home exercise.  Wearing the proper shoes at this time due to the Covid.  Discussed importance of hydration.  Importance of stretching.  Follow-up again in 4 to

## 2019-03-19 NOTE — Assessment & Plan Note (Signed)
Patient is doing very well overall.  Discussed posture ergonomics, which activities to do which wants to avoid.  Patient is to increase activity as tolerated.  Discussed icing regimen.  Follow-up again in 4 to 8 weeks.

## 2019-03-19 NOTE — Progress Notes (Signed)
[place] Phone: [phone] Subjective:   [name], am serving as a scribe for Dr. Hulan Saas. This visit occurred during the SARS-CoV-2 public health emergency.  Safety protocols were in place, including screening questions prior to the visit, additional usage of staff PPE, and extensive cleaning of exam room while observing appropriate contact time as indicated for disinfecting solutions.    I'm seeing this patient by the request  of:  [name], Cresenciano Lick, MD  CC: Low back pain follow-up  [id number]  [name] is a 59 y.o. female coming in with complaint of back pain. Last seen on 02/11/2019 for OMT. Patient states that she was playing tennis this week and developed left side tightness. Pain increases with flexion. Has tried stretching and hot shower. Pain feels better but is still not 100% better. Not able to sleep through the night.       Past Medical History:  Diagnosis Date  . Anxiety   . Crohn disease ([place])   . Depression   . GERD (gastroesophageal reflux disease)   . History of methicillin resistant staphylococcus aureus (MRSA)   . Sleep apnea    CPAP   Past Surgical History:  Procedure Laterality Date  . CESAREAN SECTION    . LIGAMENT REPAIR Right 11/23/2015   Procedure: right index radial collateral LIGAMENT REPAIR;  Surgeon: Leanora Cover, MD;  Location: [place];  Service: Orthopedics;  Laterality: Right;   Social History   Socioeconomic History  . Marital status: Married    Spouse name: Not on file  . Number of children: Not on file  . Years of education: Not on file  . Highest education level: Not on file  Occupational History  . Not on file  Tobacco Use  . Smoking status: Never Smoker  . Smokeless tobacco: Never Used  Substance and Sexual Activity  . Alcohol use: Yes    Comment: 10 glasses   . Drug use: [name]  . Sexual activity: Yes    Birth  control/protection: Pill  Other Topics Concern  . Not on file  Social History Narrative  . Not on file   Social Determinants of Health   Financial Resource Strain:   . Difficulty of Paying Living Expenses: Not on file  Food Insecurity:   . Worried About Charity fundraiser in the Last Year: Not on file  . Ran Out of Food in the Last Year: Not on file  Transportation Needs:   . Lack of Transportation (Medical): Not on file  . Lack of Transportation (Non-Medical): Not on file  Physical Activity:   . Days of Exercise per Week: Not on file  . Minutes of Exercise per Session: Not on file  Stress:   . Feeling of Stress : Not on file  Social Connections:   . Frequency of Communication with Friends and Family: Not on file  . Frequency of Social Gatherings with Friends and Family: Not on file  . Attends Religious Services: Not on file  . Active Member of Clubs or Organizations: Not on file  . Attends Archivist Meetings: Not on file  . Marital Status: Not on file   Allergies  Allergen Reactions  . Chlorhexidine Gluconate Itching  . Hydrocodone Itching   Family History  Problem Relation Age of Onset  . Alcohol abuse Mother   . Alcohol abuse Sister   . Angioedema Neg Hx   .  Allergic rhinitis Neg Hx   . Asthma Neg Hx   . Atopy Neg Hx   . Eczema Neg Hx   . Immunodeficiency Neg Hx   . Urticaria Neg Hx     Current Outpatient Medications (Endocrine & Metabolic):  .  levonorgestrel-ethinyl estradiol (AVIANE,ALESSE,LESSINA) 0.1-20 MG-MCG tablet, Take by mouth. .  predniSONE (DELTASONE) 50 MG tablet, 1 pill a day  Current Outpatient Medications (Cardiovascular):  .  colestipol (COLESTID) 1 g tablet, Take 1 g by mouth 2 (two) times daily.  Current Outpatient Medications (Respiratory):  .  cetirizine (ZYRTEC) 10 MG tablet, Take by mouth.  Current Outpatient Medications (Analgesics):  Marland Kitchen  Adalimumab (HUMIRA) 40 MG/0.4ML PSKT, Inject 0.4 mLs into the skin every 14  (fourteen) days.   Current Outpatient Medications (Other):  .  busPIRone (BUSPAR) 7.5 MG tablet, TAKE 1 TABLET BY MOUTH TWICE A DAY .  clotrimazole-betamethasone (LOTRISONE) cream, Apply 1 application topically 2 (two) times daily. Marland Kitchen  escitalopram (LEXAPRO) 10 MG tablet, TAKE 1 TABLET BY MOUTH DAILY. .  famotidine (PEPCID) 20 MG tablet, Take 20 mg by mouth daily. .  fluocinonide ointment (LIDEX) 6.81 %, Apply 1 application topically daily as needed. Marland Kitchen  LUMIGAN 0.01 % SOLN, Place 1 drop into both eyes at bedtime. Marland Kitchen  tiZANidine (ZANAFLEX) 4 MG tablet, TAKE 1 TABLET BY MOUTH AT NIGHT .  Vitamin D, Ergocalciferol, (DRISDOL) 1.25 MG (50000 UT) CAPS capsule, TAKE 1 CAPSULE BY MOUTH EVERY 7 DAYS.   Reviewed prior external information including notes and imaging from  primary care provider As well as notes that were available from care everywhere and other healthcare systems.  Past medical history, social, surgical and family history all reviewed in electronic medical record.  [name] pertanent information unless stated regarding to the chief complaint.   Review of Systems:  [name] headache, visual changes, nausea, vomiting, diarrhea, constipation, dizziness, abdominal pain, skin rash, fevers, chills, night sweats, weight loss, swollen lymph nodes, body aches, joint swelling, chest pain, shortness of breath, mood changes. POSITIVE muscle aches  Objective  Blood pressure 114/82, pulse 60, height 5' 2"  (1.575 m), weight 138 lb (62.6 kg), last menstrual period 01/10/2014, SpO2 97 %.   General: [name] apparent distress alert and oriented x3 mood and affect normal, dressed appropriately.  HEENT: Pupils equal, extraocular movements intact  Respiratory: Patient's speak in full sentences and does not appear short of breath  Cardiovascular: [name] lower extremity edema, non tender, [name] erythema  Skin: Warm dry intact with [name] signs of infection or rash on extremities or on axial skeleton.  Abdomen: Soft nontender    Neuro: Cranial nerves II through XII are intact, neurovascularly intact in all extremities with 2+ DTRs and 2+ pulses.  Lymph: [name] lymphadenopathy of posterior or anterior cervical chain or axillae bilaterally.  Gait normal with good balance and coordination.  MSK:  Non tender with full range of motion and good stability and symmetric strength and tone of shoulders, elbows, wrist, hip, knee and ankles bilaterally.  Low back exam still has some tightness comparing to patient's baseline. Tightness in the piriformis also still noted. Negative straight leg test. Mild positive FABER test. Still some mild pain with extension and stress over the iliolumbar ligament  Osteopathic findings T9 extended rotated and side bent left L4 flexed rotated and side bent left Sacrum right on right    Impression and Recommendations:     This case required medical decision making of moderate complexity. The above documentation has been reviewed and is  accurate and complete Lyndal Pulley, DO       Note: This dictation was prepared with Dragon dictation along with smaller phrase technology. Any transcriptional errors that result from this process are unintentional.

## 2019-03-19 NOTE — Assessment & Plan Note (Signed)
Decision today to treat with OMT was based on Physical Exam  After verbal consent patient was treated with HVLA, ME, FPR techniques in  thoracic, lumbar and sacral areas  Patient tolerated the procedure well with improvement in symptoms  Patient given exercises, stretches and lifestyle modifications  See medications in patient instructions if given  Patient will follow up in 4-8 weeks

## 2019-03-22 ENCOUNTER — Telehealth: Payer: Self-pay | Admitting: Family Medicine

## 2019-03-22 NOTE — Telephone Encounter (Signed)
Patient sat on stool today to tie shoe. When she bent over felt a pop above the tailbone. Recommended patient ice and gently stretch. On schedule on Wednesday.

## 2019-03-22 NOTE — Telephone Encounter (Signed)
Patient called stating that [name] was here on Friday for an adjustment and really seemed to help until [name] bent over to tie her shoe. [name] said that [name] felt/heard a pop and now her left hip hurts.  [name] did not know if [name] needed to come back in (if so, where?) or what Dr Tamala Julian would suggest?  Please advise.

## 2019-03-24 ENCOUNTER — Encounter: Payer: Self-pay | Admitting: Family Medicine

## 2019-03-24 ENCOUNTER — Other Ambulatory Visit: Payer: Self-pay

## 2019-03-24 ENCOUNTER — Ambulatory Visit (INDEPENDENT_AMBULATORY_CARE_PROVIDER_SITE_OTHER): Payer: 59 | Admitting: Family Medicine

## 2019-03-24 VITALS — BP 110/82 | HR 70 | Ht 62.0 in | Wt 138.0 lb

## 2019-03-24 DIAGNOSIS — M999 Biomechanical lesion, unspecified: Secondary | ICD-10-CM

## 2019-03-24 DIAGNOSIS — G5701 Lesion of sciatic nerve, right lower limb: Secondary | ICD-10-CM | POA: Diagnosis not present

## 2019-03-24 NOTE — Assessment & Plan Note (Signed)
Decision today to treat with OMT was based on Physical Exam  After verbal consent patient was treated with HVLA, ME, FPR techniques in  thoracic, lumbar and sacral areas  Patient tolerated the procedure well with improvement in symptoms  Patient given exercises, stretches and lifestyle modifications  See medications in patient instructions if given  Patient will follow up in 4-8 weeks

## 2019-03-24 NOTE — Assessment & Plan Note (Signed)
Patient usually has pain on the contralateral side.  The symptoms are somewhat different.  Seem to be more sacroiliac dysfunction.  We discussed with patient at the core strengthening and hip stability.  Patient has responded fairly well to osteopathic manipulation previously.  Follow-up again in 4 to 8 weeks

## 2019-03-24 NOTE — Progress Notes (Signed)
[place] Phone: [phone] Subjective:   [name], am serving as a scribe for Dr. Hulan Saas.This visit occurred during the SARS-CoV-2 public health emergency.  Safety protocols were in place, including screening questions prior to the visit, additional usage of staff PPE, and extensive cleaning of exam room while observing appropriate contact time as indicated for disinfecting solutions.   I'm seeing this patient by the request  of:  [name], Cresenciano Lick, MD  CC: Left buttocks pain  [id number]  [name] is a 59 y.o. female coming in with complaint of bilateral hip pain. Patient states that she bent down to tie her shoe and had pain in the SI joint down into the glute. Took Zanaflex and Tylenol for pain.  Patient states it is a dull, throbbing aching pain that seems to be improving a little bit.  Patient is concerned now because any type of increasing movement sometimes can cause a severe amount of pain.  Patient of course wants to avoid anything more aggressive but unfortunately is also wondering what else can be done to help the pain.    Past Medical History:  Diagnosis Date  . Anxiety   . Crohn disease ([place])   . Depression   . GERD (gastroesophageal reflux disease)   . History of methicillin resistant staphylococcus aureus (MRSA)   . Sleep apnea    CPAP   Past Surgical History:  Procedure Laterality Date  . CESAREAN SECTION    . LIGAMENT REPAIR Right 11/23/2015   Procedure: right index radial collateral LIGAMENT REPAIR;  Surgeon: Leanora Cover, MD;  Location: [facility];  Service: Orthopedics;  Laterality: Right;   Social History   Socioeconomic History  . Marital status: Married    Spouse name: Not on file  . Number of children: Not on file  . Years of education: Not on file  . Highest education level: Not on file  Occupational History  . Not on file  Tobacco Use  .  Smoking status: Never Smoker  . Smokeless tobacco: Never Used  Substance and Sexual Activity  . Alcohol use: Yes    Comment: 10 glasses   . Drug use: No  . Sexual activity: Yes    Birth control/protection: Pill  Other Topics Concern  . Not on file  Social History Narrative  . Not on file   Social Determinants of Health   Financial Resource Strain:   . Difficulty of Paying Living Expenses: Not on file  Food Insecurity:   . Worried About Charity fundraiser in the Last Year: Not on file  . Ran Out of Food in the Last Year: Not on file  Transportation Needs:   . Lack of Transportation (Medical): Not on file  . Lack of Transportation (Non-Medical): Not on file  Physical Activity:   . Days of Exercise per Week: Not on file  . Minutes of Exercise per Session: Not on file  Stress:   . Feeling of Stress : Not on file  Social Connections:   . Frequency of Communication with Friends and Family: Not on file  . Frequency of Social Gatherings with Friends and Family: Not on file  . Attends Religious Services: Not on file  . Active Member of Clubs or Organizations: Not on file  . Attends Archivist Meetings: Not on file  . Marital Status: Not on file   Allergies  Allergen Reactions  .  Chlorhexidine Gluconate Itching  . Hydrocodone Itching   Family History  Problem Relation Age of Onset  . Alcohol abuse Mother   . Alcohol abuse Sister   . Angioedema Neg Hx   . Allergic rhinitis Neg Hx   . Asthma Neg Hx   . Atopy Neg Hx   . Eczema Neg Hx   . Immunodeficiency Neg Hx   . Urticaria Neg Hx     Current Outpatient Medications (Endocrine & Metabolic):  .  levonorgestrel-ethinyl estradiol (AVIANE,ALESSE,LESSINA) 0.1-20 MG-MCG tablet, Take by mouth. .  predniSONE (DELTASONE) 50 MG tablet, 1 pill a day  Current Outpatient Medications (Cardiovascular):  .  colestipol (COLESTID) 1 g tablet, Take 1 g by mouth 2 (two) times daily.  Current Outpatient Medications  (Respiratory):  .  cetirizine (ZYRTEC) 10 MG tablet, Take by mouth.  Current Outpatient Medications (Analgesics):  Marland Kitchen  Adalimumab (HUMIRA) 40 MG/0.4ML PSKT, Inject 0.4 mLs into the skin every 14 (fourteen) days.   Current Outpatient Medications (Other):  .  busPIRone (BUSPAR) 7.5 MG tablet, TAKE 1 TABLET BY MOUTH TWICE A DAY .  clotrimazole-betamethasone (LOTRISONE) cream, Apply 1 application topically 2 (two) times daily. Marland Kitchen  escitalopram (LEXAPRO) 10 MG tablet, TAKE 1 TABLET BY MOUTH DAILY. .  famotidine (PEPCID) 20 MG tablet, Take 20 mg by mouth daily. .  fluocinonide ointment (LIDEX) 0.93 %, Apply 1 application topically daily as needed. Marland Kitchen  LUMIGAN 0.01 % SOLN, Place 1 drop into both eyes at bedtime. Marland Kitchen  tiZANidine (ZANAFLEX) 4 MG tablet, TAKE 1 TABLET BY MOUTH AT NIGHT .  Vitamin D, Ergocalciferol, (DRISDOL) 1.25 MG (50000 UT) CAPS capsule, TAKE 1 CAPSULE BY MOUTH EVERY 7 DAYS.   Reviewed prior external information including notes and imaging from  primary care provider As well as notes that were available from care everywhere and other healthcare systems.  Past medical history, social, surgical and family history all reviewed in electronic medical record.  No pertanent information unless stated regarding to the chief complaint.   Review of Systems:  No headache, visual changes, nausea, vomiting, diarrhea, constipation, dizziness, abdominal pain, skin rash, fevers, chills, night sweats, weight loss, swollen lymph nodes, body aches, joint swelling, chest pain, shortness of breath, mood changes. POSITIVE muscle aches  Objective  Blood pressure 110/82, pulse 70, height 5' 2"  (1.575 m), weight 138 lb (62.6 kg), last menstrual period 01/10/2014, SpO2 98 %.   General: No apparent distress alert and oriented x3 mood and affect normal, dressed appropriately.  HEENT: Pupils equal, extraocular movements intact  Respiratory: Patient's speak in full sentences and does not appear short of breath   Cardiovascular: No lower extremity edema, non tender, no erythema  Skin: Warm dry intact with no signs of infection or rash on extremities or on axial skeleton.  Abdomen: Soft nontender  Neuro: Cranial nerves II through XII are intact, neurovascularly intact in all extremities with 2+ DTRs and 2+ pulses.  Lymph: No lymphadenopathy of posterior or anterior cervical chain or axillae bilaterally.  Gait normal with good balance and coordination.  MSK:  tender with full range of motion and good stability and symmetric strength and tone of shoulders, elbows, wrist, hip, knee and ankles bilaterally.  Low back exam does show some tightness noted and some increasing tenderness over the left sacroiliac joint.  This seems to be a little bit worse.  Patient does have a mild positive Corky Sox but a negative straight leg test.  Less pain with flexion extension than usual though.  Neurovascular  intact distally with 5 out of 5 strength.  Osteopathic findings T7 extended rotated and side bent left L2 flexed rotated and side bent right Sacrum left on left    Impression and Recommendations:     This case required medical decision making of moderate complexity. The above documentation has been reviewed and is accurate and complete Lyndal Pulley, DO       Note: This dictation was prepared with Dragon dictation along with smaller phrase technology. Any transcriptional errors that result from this process are unintentional.

## 2019-03-24 NOTE — Patient Instructions (Signed)
Take it easy today See me again in 4 weeks

## 2019-03-25 ENCOUNTER — Ambulatory Visit: Payer: 59 | Admitting: Family Medicine

## 2019-04-02 ENCOUNTER — Other Ambulatory Visit: Payer: Self-pay | Admitting: Family Medicine

## 2019-04-02 MED FILL — COLESTIPOL HCL 1 GM TABLET: 1 | 30 days supply | Qty: 180 | Fill #2

## 2019-04-02 MED FILL — VIT D2 1.25 MG (50,000 UNIT: 1.25 MG | 84 days supply | Qty: 12 | Fill #0

## 2019-04-02 MED FILL — CELECOXIB 100 MG CAP: 100 | 30 days supply | Qty: 60 | Fill #0

## 2019-04-02 MED FILL — LUMIGAN 0.01% EYE DROPS: 0.01 | 25 days supply | Qty: 3 | Fill #2

## 2019-04-05 MED FILL — busPIRone HCL 7.5 MG TABS: 7.5 | 90 days supply | Qty: 180 | Fill #1

## 2019-04-19 DIAGNOSIS — G4733 Obstructive sleep apnea (adult) (pediatric): Secondary | ICD-10-CM | POA: Diagnosis not present

## 2019-04-20 ENCOUNTER — Encounter: Payer: Self-pay | Admitting: Family Medicine

## 2019-04-20 ENCOUNTER — Other Ambulatory Visit: Payer: Self-pay

## 2019-04-20 ENCOUNTER — Ambulatory Visit (INDEPENDENT_AMBULATORY_CARE_PROVIDER_SITE_OTHER): Payer: 59

## 2019-04-20 ENCOUNTER — Ambulatory Visit (INDEPENDENT_AMBULATORY_CARE_PROVIDER_SITE_OTHER): Payer: 59 | Admitting: Family Medicine

## 2019-04-20 VITALS — BP 110/72 | HR 74 | Ht 62.0 in | Wt 149.0 lb

## 2019-04-20 DIAGNOSIS — M25551 Pain in right hip: Secondary | ICD-10-CM

## 2019-04-20 DIAGNOSIS — S7001XA Contusion of right hip, initial encounter: Secondary | ICD-10-CM | POA: Insufficient documentation

## 2019-04-20 DIAGNOSIS — S7011XA Contusion of right thigh, initial encounter: Secondary | ICD-10-CM | POA: Diagnosis not present

## 2019-04-20 NOTE — Progress Notes (Signed)
[place] [address] [place] Phone: [phone] Subjective:   I [name] am serving as a Education administrator for Dr. Hulan Saas.  This visit occurred during the SARS-CoV-2 public health emergency.  Safety protocols were in place, including screening questions prior to the visit, additional usage of staff PPE, and extensive cleaning of exam room while observing appropriate contact time as indicated for disinfecting solutions.   I'm seeing this patient by the request  of:  [name], Cresenciano Lick, MD  CC: Right hip new injury  [id number]  [name] is a 59 y.o. female coming in with complaint of right hip pain. Last seen on 03/24/2019 for OMT. Patient states she fell yesterday. Fell directly on the hip and felt something pop. Patient states the pain radiates to the posterior knee. Sitting, flexion, and stairs is painful.      Past Medical History:  Diagnosis Date  . Anxiety   . Crohn disease ([place])   . Depression   . GERD (gastroesophageal reflux disease)   . History of methicillin resistant staphylococcus aureus (MRSA)   . Sleep apnea    CPAP   Past Surgical History:  Procedure Laterality Date  . CESAREAN SECTION    . LIGAMENT REPAIR Right 11/23/2015   Procedure: right index radial collateral LIGAMENT REPAIR;  Surgeon: Leanora Cover, MD;  Location: [facility];  Service: Orthopedics;  Laterality: Right;   Social History   Socioeconomic History  . Marital status: Married    Spouse name: Not on file  . Number of children: Not on file  . Years of education: Not on file  . Highest education level: Not on file  Occupational History  . Not on file  Tobacco Use  . Smoking status: Never Smoker  . Smokeless tobacco: Never Used  Substance and Sexual Activity  . Alcohol use: Yes    Comment: 10 glasses   . Drug use: No  . Sexual activity: Yes    Birth control/protection: Pill  Other Topics Concern  . Not on file    Social History Narrative  . Not on file   Social Determinants of Health   Financial Resource Strain:   . Difficulty of Paying Living Expenses:   Food Insecurity:   . Worried About Charity fundraiser in the Last Year:   . Arboriculturist in the Last Year:   Transportation Needs:   . Film/video editor (Medical):   Marland Kitchen Lack of Transportation (Non-Medical):   Physical Activity:   . Days of Exercise per Week:   . Minutes of Exercise per Session:   Stress:   . Feeling of Stress :   Social Connections:   . Frequency of Communication with Friends and Family:   . Frequency of Social Gatherings with Friends and Family:   . Attends Religious Services:   . Active Member of Clubs or Organizations:   . Attends Archivist Meetings:   Marland Kitchen Marital Status:    Allergies  Allergen Reactions  . Chlorhexidine Gluconate Itching  . Hydrocodone Itching   Family History  Problem Relation Age of Onset  . Alcohol abuse Mother   . Alcohol abuse Sister   . Angioedema Neg Hx   . Allergic rhinitis Neg Hx   . Asthma Neg Hx   . Atopy Neg Hx   . Eczema Neg Hx   . Immunodeficiency Neg Hx   . Urticaria Neg Hx  Current Outpatient Medications (Endocrine & Metabolic):  .  levonorgestrel-ethinyl estradiol (AVIANE,ALESSE,LESSINA) 0.1-20 MG-MCG tablet, Take by mouth. .  predniSONE (DELTASONE) 50 MG tablet, 1 pill a day  Current Outpatient Medications (Cardiovascular):  .  colestipol (COLESTID) 1 g tablet, Take 1 g by mouth 2 (two) times daily.  Current Outpatient Medications (Respiratory):  .  cetirizine (ZYRTEC) 10 MG tablet, Take by mouth.  Current Outpatient Medications (Analgesics):  Marland Kitchen  Adalimumab (HUMIRA) 40 MG/0.4ML PSKT, Inject 0.4 mLs into the skin every 14 (fourteen) days.   Current Outpatient Medications (Other):  .  busPIRone (BUSPAR) 7.5 MG tablet, TAKE 1 TABLET BY MOUTH TWICE A DAY .  clotrimazole-betamethasone (LOTRISONE) cream, Apply 1 application topically 2 (two)  times daily. Marland Kitchen  escitalopram (LEXAPRO) 10 MG tablet, TAKE 1 TABLET BY MOUTH DAILY. .  famotidine (PEPCID) 20 MG tablet, Take 20 mg by mouth daily. .  fluocinonide ointment (LIDEX) 1.61 %, Apply 1 application topically daily as needed. Marland Kitchen  LUMIGAN 0.01 % SOLN, Place 1 drop into both eyes at bedtime. Marland Kitchen  tiZANidine (ZANAFLEX) 4 MG tablet, TAKE 1 TABLET BY MOUTH AT NIGHT .  Vitamin D, Ergocalciferol, (DRISDOL) 1.25 MG (50000 UNIT) CAPS capsule, TAKE 1 CAPSULE BY MOUTH EVERY 7 DAYS.   Reviewed prior external information including notes and imaging from  primary care provider As well as notes that were available from care everywhere and other healthcare systems.  Past medical history, social, surgical and family history all reviewed in electronic medical record.  No pertanent information unless stated regarding to the chief complaint.   Review of Systems:  No headache, visual changes, nausea, vomiting, diarrhea, constipation, dizziness, abdominal pain, skin rash, fevers, chills, night sweats, weight loss, swollen lymph nodes, body aches, joint swelling, chest pain, shortness of breath, mood changes. POSITIVE muscle aches  Objective  Blood pressure 110/72, pulse 74, height 5' 2"  (1.575 m), weight 149 lb (67.6 kg), last menstrual period 01/10/2014, SpO2 98 %.   General: No apparent distress alert and oriented x3 mood and affect normal, dressed appropriately.  HEENT: Pupils equal, extraocular movements intact  Respiratory: Patient's speak in full sentences and does not appear short of breath  Cardiovascular: No lower extremity edema, non tender, no erythema  Neuro: Cranial nerves II through XII are intact, neurovascularly intact in all extremities with 2+ DTRs and 2+ pulses.  Gait normal with good balance and coordination.  MSK:  Non tender with full range of motion and good stability and symmetric strength and tone of shoulders, elbows, wrist, knee and ankles bilaterally.  Right hip exam shows the  patient is severely tender over the greater trochanteric area.  No pain in the groin area.  Patient does have some guarded range of motion but does have near full range of motion.  Negative straight leg test.  Limited musculoskeletal ultrasound was performed and interpreted by Lyndal Pulley  Limited ultrasound of patient's hip on the lateral aspect shows the patient does have soft tissue contusion noted with hypoechoic changes.  Patient also unfortunately does have a bone contusion but no cortical irregularity of the greater trochanteric area.   Impression and Recommendations:     This case required medical decision making of moderate complexity. The above documentation has been reviewed and is accurate and complete Lyndal Pulley, DO       Note: This dictation was prepared with Dragon dictation along with smaller phrase technology. Any transcriptional errors that result from this process are unintentional.

## 2019-04-20 NOTE — Assessment & Plan Note (Signed)
Right hip contusion.  Discussed icing regimen and home exercise, which activities to do left wants to avoid.  Patient is to increase activity slowly over the course the next several weeks.  Patient will follow up with me again in 4 to 8 weeks.

## 2019-04-20 NOTE — Patient Instructions (Addendum)
Good to see you  Arnica lotion 2 times a day Ice every 4-6 hours while awake for the next 3 days Heat after activity then ice Ok to increase activity in about a week See me again as scheduled

## 2019-04-26 MED FILL — HUMIRA CF 40 MG/0.4ML PSKT: 40 | 28 days supply | Qty: 2 | Fill #1

## 2019-04-29 ENCOUNTER — Encounter: Payer: Self-pay | Admitting: Family Medicine

## 2019-04-29 ENCOUNTER — Ambulatory Visit (INDEPENDENT_AMBULATORY_CARE_PROVIDER_SITE_OTHER): Payer: 59 | Admitting: Family Medicine

## 2019-04-29 ENCOUNTER — Other Ambulatory Visit: Payer: Self-pay

## 2019-04-29 VITALS — BP 110/72 | HR 65 | Ht 62.0 in | Wt 149.0 lb

## 2019-04-29 DIAGNOSIS — M999 Biomechanical lesion, unspecified: Secondary | ICD-10-CM

## 2019-04-29 DIAGNOSIS — G5701 Lesion of sciatic nerve, right lower limb: Secondary | ICD-10-CM | POA: Diagnosis not present

## 2019-04-29 NOTE — Assessment & Plan Note (Signed)
Patient is doing much better at this time.  Discussed icing regimen and home exercise, which activities to do which wants to avoid.  Increase activity slowly.  Follow-up again in 4 to 6 weeks

## 2019-04-29 NOTE — Assessment & Plan Note (Signed)

## 2019-04-29 NOTE — Patient Instructions (Addendum)
Everyly well See me again in 5 weeks

## 2019-04-29 NOTE — Progress Notes (Signed)
[place] Phone: [phone] Subjective:   [name], am serving as a scribe for Dr. Hulan Saas. This visit occurred during the SARS-CoV-2 public health emergency.  Safety protocols were in place, including screening questions prior to the visit, additional usage of staff PPE, and extensive cleaning of exam room while observing appropriate contact time as indicated for disinfecting solutions.   I'm seeing this patient by the request  of:  [name], [name], MD  CC: Low back and hip pain follow-up  [id number]   4/6/20021 Right hip contusion.  Discussed icing regimen and home exercise, which activities to do left wants to avoid.  Patient is to increase activity slowly over the course the next several weeks.  Patient will follow up with me again in 4 to 8 weeks.  Update 04/29/2019 [name] is a 59 y.o. female coming in with complaint of right hip pain. Patient states that her hip is still sore but is much better than last visit. Patient is having lower back tightness and right hamstring tightness. Has been stretching.  Making progress again.  Still some mild discomfort from time to time but nothing as severe.  Was able to play tennis the other day without any significant pain.      Past Medical History:  Diagnosis Date  . Anxiety   . Crohn disease ([place])   . Depression   . GERD (gastroesophageal reflux disease)   . History of methicillin resistant staphylococcus aureus (MRSA)   . Sleep apnea    CPAP   Past Surgical History:  Procedure Laterality Date  . CESAREAN SECTION    . LIGAMENT REPAIR Right 11/23/2015   Procedure: right index radial collateral LIGAMENT REPAIR;  Surgeon: Leanora Cover, MD;  Location: [facility];  Service: Orthopedics;  Laterality: Right;   Social History   Socioeconomic History  . Marital status: Married    Spouse name: Not on file  . Number of children:  Not on file  . Years of education: Not on file  . Highest education level: Not on file  Occupational History  . Not on file  Tobacco Use  . Smoking status: Never Smoker  . Smokeless tobacco: Never Used  Substance and Sexual Activity  . Alcohol use: Yes    Comment: 10 glasses   . Drug use: [name]  . Sexual activity: Yes    Birth control/protection: Pill  Other Topics Concern  . Not on file  Social History Narrative  . Not on file   Social Determinants of Health   Financial Resource Strain:   . Difficulty of Paying Living Expenses:   Food Insecurity:   . Worried About Charity fundraiser in the Last Year:   . Arboriculturist in the Last Year:   Transportation Needs:   . Film/video editor (Medical):   Marland Kitchen Lack of Transportation (Non-Medical):   Physical Activity:   . Days of Exercise per Week:   . Minutes of Exercise per Session:   Stress:   . Feeling of Stress :   Social Connections:   . Frequency of Communication with Friends and Family:   . Frequency of Social Gatherings with Friends and Family:   . Attends Religious Services:   . Active Member of Clubs or Organizations:   . Attends Archivist Meetings:   Marland Kitchen Marital Status:    Allergies  Allergen Reactions  . Chlorhexidine Gluconate  Itching  . Hydrocodone Itching   Family History  Problem Relation Age of Onset  . Alcohol abuse Mother   . Alcohol abuse Sister   . Angioedema Neg Hx   . Allergic rhinitis Neg Hx   . Asthma Neg Hx   . Atopy Neg Hx   . Eczema Neg Hx   . Immunodeficiency Neg Hx   . Urticaria Neg Hx     Current Outpatient Medications (Endocrine & Metabolic):  .  levonorgestrel-ethinyl estradiol (AVIANE,ALESSE,LESSINA) 0.1-20 MG-MCG tablet, Take by mouth. .  predniSONE (DELTASONE) 50 MG tablet, 1 pill a day  Current Outpatient Medications (Cardiovascular):  .  colestipol (COLESTID) 1 g tablet, Take 1 g by mouth 2 (two) times daily.  Current Outpatient Medications (Respiratory):  .   cetirizine (ZYRTEC) 10 MG tablet, Take by mouth.  Current Outpatient Medications (Analgesics):  Marland Kitchen  Adalimumab (HUMIRA) 40 MG/0.4ML PSKT, Inject 0.4 mLs into the skin every 14 (fourteen) days.   Current Outpatient Medications (Other):  .  busPIRone (BUSPAR) 7.5 MG tablet, TAKE 1 TABLET BY MOUTH TWICE A DAY .  clotrimazole-betamethasone (LOTRISONE) cream, Apply 1 application topically 2 (two) times daily. Marland Kitchen  escitalopram (LEXAPRO) 10 MG tablet, TAKE 1 TABLET BY MOUTH DAILY. .  famotidine (PEPCID) 20 MG tablet, Take 20 mg by mouth daily. .  fluocinonide ointment (LIDEX) 4.56 %, Apply 1 application topically daily as needed. Marland Kitchen  LUMIGAN 0.01 % SOLN, Place 1 drop into both eyes at bedtime. Marland Kitchen  tiZANidine (ZANAFLEX) 4 MG tablet, TAKE 1 TABLET BY MOUTH AT NIGHT .  Vitamin D, Ergocalciferol, (DRISDOL) 1.25 MG (50000 UNIT) CAPS capsule, TAKE 1 CAPSULE BY MOUTH EVERY 7 DAYS.   Reviewed prior external information including notes and imaging from  primary care provider As well as notes that were available from care everywhere and other healthcare systems.  Past medical history, social, surgical and family history all reviewed in electronic medical record.  [name] pertanent information unless stated regarding to the chief complaint.   Review of Systems:  [name] headache, visual changes, nausea, vomiting, diarrhea, constipation, dizziness, abdominal pain, skin rash, fevers, chills, night sweats, weight loss, swollen lymph nodes, body aches, joint swelling, chest pain, shortness of breath, mood changes. POSITIVE muscle aches  Objective  Blood pressure 110/72, pulse 65, height 5' 2"  (1.575 m), weight 149 lb (67.6 kg), last menstrual period 01/10/2014, SpO2 96 %.   General: [name] apparent distress alert and oriented x3 mood and affect normal, dressed appropriately.  HEENT: Pupils equal, extraocular movements intact  Respiratory: Patient's speak in full sentences and does not appear short of breath  Cardiovascular:  [name] lower extremity edema, non tender, [name] erythema  Neuro: Cranial nerves II through XII are intact, neurovascularly intact in all extremities with 2+ DTRs and 2+ pulses.  Gait normal with good balance and coordination.  MSK:  Non tender with full range of motion and good stability and symmetric strength and tone of shoulders, elbows, wrist, hip, knee and ankles bilaterally.  Low back does show some mild loss of lordosis.  Tender to palpation in the paraspinal musculature lumbar spine right greater than left.  Patient has minimal tenderness over the greater trochanteric areas bilaterally.  Osteopathic findings  T7 extended rotated and side bent left L2 flexed rotated and side bent right Sacrum right on right    Impression and Recommendations:     This case required medical decision making of moderate complexity. The above documentation has been reviewed and is accurate and complete [name]  Koren Bound, DO       Note: This dictation was prepared with Dragon dictation along with smaller phrase technology. Any transcriptional errors that result from this process are unintentional.

## 2019-05-11 ENCOUNTER — Encounter: Payer: Self-pay | Admitting: Family Medicine

## 2019-05-11 ENCOUNTER — Ambulatory Visit (INDEPENDENT_AMBULATORY_CARE_PROVIDER_SITE_OTHER): Payer: 59 | Admitting: Family Medicine

## 2019-05-11 ENCOUNTER — Other Ambulatory Visit: Payer: Self-pay

## 2019-05-11 ENCOUNTER — Ambulatory Visit (INDEPENDENT_AMBULATORY_CARE_PROVIDER_SITE_OTHER): Payer: 59

## 2019-05-11 VITALS — BP 102/60 | HR 87 | Ht 62.0 in | Wt 143.0 lb

## 2019-05-11 DIAGNOSIS — G5701 Lesion of sciatic nerve, right lower limb: Secondary | ICD-10-CM

## 2019-05-11 DIAGNOSIS — M25551 Pain in right hip: Secondary | ICD-10-CM

## 2019-05-11 DIAGNOSIS — M999 Biomechanical lesion, unspecified: Secondary | ICD-10-CM | POA: Diagnosis not present

## 2019-05-11 NOTE — Progress Notes (Signed)
[place] Phone: [phone] Subjective:   [name], am serving as a scribe for Dr. Hulan Saas. This visit occurred during the SARS-CoV-2 public health emergency.  Safety protocols were in place, including screening questions prior to the visit, additional usage of staff PPE, and extensive cleaning of exam room while observing appropriate contact time as indicated for disinfecting solutions.   I'm seeing this patient by the request  of:  [name], Cresenciano Lick, MD  CC: Back pain follow-up  [id number]  [name] is a 59 y.o. female coming in with complaint of back pain. Last seen on 04/29/2019. Patient states she was playing tennis on Friday and felt 2 pops in right hip. Kept playing but over the weekend her pain worsened as she felt another pop. Went for a walk yesterday. Hips shifted and her pain was relieved. Pain is still in right glute. Did have tingling in foot as well.  Patient states that continues to have this pain intermittently.  Still seems to be more in the buttocks area than anywhere else.      Past Medical History:  Diagnosis Date  . Anxiety   . Crohn disease ([place])   . Depression   . GERD (gastroesophageal reflux disease)   . History of methicillin resistant staphylococcus aureus (MRSA)   . Sleep apnea    CPAP   Past Surgical History:  Procedure Laterality Date  . CESAREAN SECTION    . LIGAMENT REPAIR Right 11/23/2015   Procedure: right index radial collateral LIGAMENT REPAIR;  Surgeon: Leanora Cover, MD;  Location: [facility];  Service: Orthopedics;  Laterality: Right;   Social History   Socioeconomic History  . Marital status: Married    Spouse name: Not on file  . Number of children: Not on file  . Years of education: Not on file  . Highest education level: Not on file  Occupational History  . Not on file  Tobacco Use  . Smoking status: Never Smoker  .  Smokeless tobacco: Never Used  Substance and Sexual Activity  . Alcohol use: Yes    Comment: 10 glasses   . Drug use: No  . Sexual activity: Yes    Birth control/protection: Pill  Other Topics Concern  . Not on file  Social History Narrative  . Not on file   Social Determinants of Health   Financial Resource Strain:   . Difficulty of Paying Living Expenses:   Food Insecurity:   . Worried About Charity fundraiser in the Last Year:   . Arboriculturist in the Last Year:   Transportation Needs:   . Film/video editor (Medical):   Marland Kitchen Lack of Transportation (Non-Medical):   Physical Activity:   . Days of Exercise per Week:   . Minutes of Exercise per Session:   Stress:   . Feeling of Stress :   Social Connections:   . Frequency of Communication with Friends and Family:   . Frequency of Social Gatherings with Friends and Family:   . Attends Religious Services:   . Active Member of Clubs or Organizations:   . Attends Archivist Meetings:   Marland Kitchen Marital Status:    Allergies  Allergen Reactions  . Chlorhexidine Gluconate Itching  . Hydrocodone Itching   Family History  Problem Relation Age of Onset  . Alcohol abuse Mother   . Alcohol abuse Sister   . Angioedema Neg  Hx   . Allergic rhinitis Neg Hx   . Asthma Neg Hx   . Atopy Neg Hx   . Eczema Neg Hx   . Immunodeficiency Neg Hx   . Urticaria Neg Hx     Current Outpatient Medications (Endocrine & Metabolic):  .  levonorgestrel-ethinyl estradiol (AVIANE,ALESSE,LESSINA) 0.1-20 MG-MCG tablet, Take by mouth. .  predniSONE (DELTASONE) 50 MG tablet, 1 pill a day  Current Outpatient Medications (Cardiovascular):  .  colestipol (COLESTID) 1 g tablet, Take 1 g by mouth 2 (two) times daily.  Current Outpatient Medications (Respiratory):  .  cetirizine (ZYRTEC) 10 MG tablet, Take by mouth.  Current Outpatient Medications (Analgesics):  Marland Kitchen  Adalimumab (HUMIRA) 40 MG/0.4ML PSKT, Inject 0.4 mLs into the skin every 14  (fourteen) days.   Current Outpatient Medications (Other):  .  busPIRone (BUSPAR) 7.5 MG tablet, TAKE 1 TABLET BY MOUTH TWICE A DAY .  clotrimazole-betamethasone (LOTRISONE) cream, Apply 1 application topically 2 (two) times daily. Marland Kitchen  escitalopram (LEXAPRO) 10 MG tablet, TAKE 1 TABLET BY MOUTH DAILY. .  famotidine (PEPCID) 20 MG tablet, Take 20 mg by mouth daily. .  fluocinonide ointment (LIDEX) 4.76 %, Apply 1 application topically daily as needed. Marland Kitchen  LUMIGAN 0.01 % SOLN, Place 1 drop into both eyes at bedtime. Marland Kitchen  tiZANidine (ZANAFLEX) 4 MG tablet, TAKE 1 TABLET BY MOUTH AT NIGHT .  Vitamin D, Ergocalciferol, (DRISDOL) 1.25 MG (50000 UNIT) CAPS capsule, TAKE 1 CAPSULE BY MOUTH EVERY 7 DAYS.   Reviewed prior external information including notes and imaging from  primary care provider As well as notes that were available from care everywhere and other healthcare systems.  Past medical history, social, surgical and family history all reviewed in electronic medical record.  No pertanent information unless stated regarding to the chief complaint.   Review of Systems:  No headache, visual changes, nausea, vomiting, diarrhea, constipation, dizziness, abdominal pain, skin rash, fevers, chills, night sweats, weight loss, swollen lymph nodes, body aches, joint swelling, chest pain, shortness of breath, mood changes. POSITIVE muscle aches  Objective  Blood pressure 102/60, pulse 87, height 5' 2"  (1.575 m), weight 143 lb (64.9 kg), last menstrual period 01/10/2014, SpO2 98 %.   General: No apparent distress alert and oriented x3 mood and affect normal, dressed appropriately.  HEENT: Pupils equal, extraocular movements intact  Respiratory: Patient's speak in full sentences and does not appear short of breath  Cardiovascular: No lower extremity edema, non tender, no erythema  Neuro: Cranial nerves II through XII are intact, neurovascularly intact in all extremities with 2+ DTRs and 2+ pulses.  Gait  normal with good balance and coordination.  MSK:  tender with full range of motion and good stability and symmetric strength and tone of shoulders, elbows, wrist, hip, knee and ankles bilaterally.  Patient back exam still shows some tenderness in the sacroiliac joint right greater than left.  More pain in the right piriformis than previous exam.  Osteopathic findings  T8 extended rotated and side bent left L2 flexed rotated and side bent right Sacrum right on right   Procedure: Real-time Ultrasound Guided Injection of right piriformis tendon sheath Device: GE Logiq Q7 Ultrasound guided injection is preferred based studies that show increased duration, increased effect, greater accuracy, decreased procedural pain, increased response rate, and decreased cost with ultrasound guided versus blind injection.  Verbal informed consent obtained.  Time-out conducted.  Noted no overlying erythema, induration, or other signs of local infection.  Skin prepped in a sterile  fashion.  Local anesthesia: Topical Ethyl chloride.  With sterile technique and under real time ultrasound guidance: With a 21-gauge 2 inch needle injected with 0.5 cc of 0.5% Marcaine and 0.5 cc of Kenalog 40 mg/mL Completed without difficulty  Pain immediately resolved suggesting accurate placement of the medication.  Advised to call if fevers/chills, erythema, induration, drainage, or persistent bleeding.  Images permanently stored and available for review in the ultrasound unit.  Impression: Technically successful ultrasound guided injection.   Impression and Recommendations:     This case required medical decision making of moderate complexity. The above documentation has been reviewed and is accurate and complete Lyndal Pulley, DO       Note: This dictation was prepared with Dragon dictation along with smaller phrase technology. Any transcriptional errors that result from this process are unintentional.

## 2019-05-11 NOTE — Assessment & Plan Note (Signed)

## 2019-05-11 NOTE — Assessment & Plan Note (Signed)
Patient given injection today, tolerated the procedure well.  Cannot tell if there is secondary to a cyst in the tendon sheath or an underlying bursitis.  Hopefully though we are anticipating good improvement.  Discussed icing regimen, home exercise, which activities to doing which wants to avoid.  Increase activity slowly.  Spine is well to manipulation.  No other large changes yet in the medication but I would consider changing next Effexor to Lexapro.

## 2019-05-11 NOTE — Patient Instructions (Addendum)
Injected piriformis bursitis vs cyst Trimecinelone Send me a message Friday See me in 4-5 weeks

## 2019-05-13 DIAGNOSIS — D225 Melanocytic nevi of trunk: Secondary | ICD-10-CM | POA: Diagnosis not present

## 2019-05-13 DIAGNOSIS — D224 Melanocytic nevi of scalp and neck: Secondary | ICD-10-CM | POA: Diagnosis not present

## 2019-05-13 DIAGNOSIS — L821 Other seborrheic keratosis: Secondary | ICD-10-CM | POA: Diagnosis not present

## 2019-05-13 DIAGNOSIS — D2272 Melanocytic nevi of left lower limb, including hip: Secondary | ICD-10-CM | POA: Diagnosis not present

## 2019-05-13 DIAGNOSIS — L570 Actinic keratosis: Secondary | ICD-10-CM | POA: Diagnosis not present

## 2019-05-13 DIAGNOSIS — I788 Other diseases of capillaries: Secondary | ICD-10-CM | POA: Diagnosis not present

## 2019-05-13 DIAGNOSIS — L820 Inflamed seborrheic keratosis: Secondary | ICD-10-CM | POA: Diagnosis not present

## 2019-05-13 DIAGNOSIS — B351 Tinea unguium: Secondary | ICD-10-CM | POA: Diagnosis not present

## 2019-05-13 DIAGNOSIS — D2271 Melanocytic nevi of right lower limb, including hip: Secondary | ICD-10-CM | POA: Diagnosis not present

## 2019-05-13 MED FILL — FLUCONAZOLE 200 MG TABLET: 200 | 84 days supply | Qty: 12 | Fill #0

## 2019-05-18 MED FILL — ESTRADIOL-NORETHINDRONE ACE: 1-0.5 | 84 days supply | Qty: 84 | Fill #2

## 2019-05-18 MED FILL — LUMIGAN 0.01% EYE DROPS: 0.01 | 25 days supply | Qty: 3 | Fill #3

## 2019-06-03 ENCOUNTER — Ambulatory Visit (INDEPENDENT_AMBULATORY_CARE_PROVIDER_SITE_OTHER): Payer: 59 | Admitting: Family Medicine

## 2019-06-03 ENCOUNTER — Other Ambulatory Visit: Payer: Self-pay

## 2019-06-03 ENCOUNTER — Encounter: Payer: Self-pay | Admitting: Family Medicine

## 2019-06-03 VITALS — BP 120/82 | HR 73 | Ht 62.0 in | Wt 143.0 lb

## 2019-06-03 DIAGNOSIS — M999 Biomechanical lesion, unspecified: Secondary | ICD-10-CM | POA: Diagnosis not present

## 2019-06-03 DIAGNOSIS — G5701 Lesion of sciatic nerve, right lower limb: Secondary | ICD-10-CM

## 2019-06-03 NOTE — Assessment & Plan Note (Addendum)

## 2019-06-03 NOTE — Assessment & Plan Note (Signed)
Still believe the patient does have more of a piriformis syndrome.  Differential includes a lumbar radiculopathy.  Seems to be stable.  Discussed medications on a regular basis.  Icing regimen, home exercises, follow-up again in 4 to 6 weeks

## 2019-06-03 NOTE — Progress Notes (Signed)
[facility] [place] Phone: [phone] Subjective:   [name], am serving as a scribe for Dr. Hulan Saas. This visit occurred during the SARS-CoV-2 public health emergency.  Safety protocols were in place, including screening questions prior to the visit, additional usage of staff PPE, and extensive cleaning of exam room while observing appropriate contact time as indicated for disinfecting solutions.   I'm seeing this patient by the request  of:  [name], [name], MD  CC: Back pain follow-up  GMW:[id number]   05/11/2019 Patient given injection today, tolerated the procedure well.  Cannot tell if there is secondary to a cyst in the tendon sheath or an underlying bursitis.  Hopefully though we are anticipating good improvement.  Discussed icing regimen, home exercise, which activities to doing which wants to avoid.  Increase activity slowly.  Spine is well to manipulation.  [name] other large changes yet in the medication but I would consider changing next Effexor to Lexapro.  Update 06/03/2019 [name] is a 59 y.o. female coming in with complaint of back pain and piriformis pain. Patient states that she stepped wrong after the injection so has not had as much relief as she hoped. Pain is not as intense but is constant. Pain right side.   Right shoulder bothering her today. Posterior aspect. New problem. Does not hurt with serving.  Patient states that it is just more of a dull throbbing ache      Past Medical History:  Diagnosis Date  . Anxiety   . Crohn disease ([place])   . Depression   . GERD (gastroesophageal reflux disease)   . History of methicillin resistant staphylococcus aureus (MRSA)   . Sleep apnea    CPAP   Past Surgical History:  Procedure Laterality Date  . CESAREAN SECTION    . LIGAMENT REPAIR Right 11/23/2015   Procedure: right index radial collateral LIGAMENT REPAIR;  Surgeon: Leanora Cover, MD;   Location: [facility];  Service: Orthopedics;  Laterality: Right;   Social History   Socioeconomic History  . Marital status: Married    Spouse name: Not on file  . Number of children: Not on file  . Years of education: Not on file  . Highest education level: Not on file  Occupational History  . Not on file  Tobacco Use  . Smoking status: Never Smoker  . Smokeless tobacco: Never Used  Substance and Sexual Activity  . Alcohol use: Yes    Comment: 10 glasses   . Drug use: [name]  . Sexual activity: Yes    Birth control/protection: Pill  Other Topics Concern  . Not on file  Social History Narrative  . Not on file   Social Determinants of Health   Financial Resource Strain:   . Difficulty of Paying Living Expenses:   Food Insecurity:   . Worried About Charity fundraiser in the Last Year:   . Arboriculturist in the Last Year:   Transportation Needs:   . Film/video editor (Medical):   Marland Kitchen Lack of Transportation (Non-Medical):   Physical Activity:   . Days of Exercise per Week:   . Minutes of Exercise per Session:   Stress:   . Feeling of Stress :   Social Connections:   . Frequency of Communication with Friends and Family:   . Frequency of Social Gatherings with Friends and Family:   . Attends Religious Services:   .  Active Member of Clubs or Organizations:   . Attends Archivist Meetings:   Marland Kitchen Marital Status:    Allergies  Allergen Reactions  . Chlorhexidine Gluconate Itching  . Hydrocodone Itching   Family History  Problem Relation Age of Onset  . Alcohol abuse Mother   . Alcohol abuse Sister   . Angioedema Neg Hx   . Allergic rhinitis Neg Hx   . Asthma Neg Hx   . Atopy Neg Hx   . Eczema Neg Hx   . Immunodeficiency Neg Hx   . Urticaria Neg Hx     Current Outpatient Medications (Endocrine & Metabolic):  .  levonorgestrel-ethinyl estradiol (AVIANE,ALESSE,LESSINA) 0.1-20 MG-MCG tablet, Take by mouth. .  predniSONE (DELTASONE) 50 MG  tablet, 1 pill a day  Current Outpatient Medications (Cardiovascular):  .  colestipol (COLESTID) 1 g tablet, Take 1 g by mouth 2 (two) times daily.  Current Outpatient Medications (Respiratory):  .  cetirizine (ZYRTEC) 10 MG tablet, Take by mouth.  Current Outpatient Medications (Analgesics):  Marland Kitchen  Adalimumab (HUMIRA) 40 MG/0.4ML PSKT, Inject 0.4 mLs into the skin every 14 (fourteen) days.   Current Outpatient Medications (Other):  .  busPIRone (BUSPAR) 7.5 MG tablet, TAKE 1 TABLET BY MOUTH TWICE A DAY .  clotrimazole-betamethasone (LOTRISONE) cream, Apply 1 application topically 2 (two) times daily. Marland Kitchen  escitalopram (LEXAPRO) 10 MG tablet, TAKE 1 TABLET BY MOUTH DAILY. .  famotidine (PEPCID) 20 MG tablet, Take 20 mg by mouth daily. .  fluocinonide ointment (LIDEX) 4.01 %, Apply 1 application topically daily as needed. Marland Kitchen  LUMIGAN 0.01 % SOLN, Place 1 drop into both eyes at bedtime. Marland Kitchen  tiZANidine (ZANAFLEX) 4 MG tablet, TAKE 1 TABLET BY MOUTH AT NIGHT .  Vitamin D, Ergocalciferol, (DRISDOL) 1.25 MG (50000 UNIT) CAPS capsule, TAKE 1 CAPSULE BY MOUTH EVERY 7 DAYS.   Reviewed prior external information including notes and imaging from  primary care provider As well as notes that were available from care everywhere and other healthcare systems.  Past medical history, social, surgical and family history all reviewed in electronic medical record.  [name] pertanent information unless stated regarding to the chief complaint.   Review of Systems:  [name] headache, visual changes, nausea, vomiting, diarrhea, constipation, dizziness, abdominal pain, skin rash, fevers, chills, night sweats, weight loss, swollen lymph nodes, body aches, joint swelling, chest pain, shortness of breath, mood changes. POSITIVE muscle aches  Objective  Blood pressure 120/82, pulse 73, height 5' 2"  (1.575 m), weight 143 lb (64.9 kg), last menstrual period 01/10/2014, SpO2 98 %.   General: [name] apparent distress alert and oriented  x3 mood and affect normal, dressed appropriately.  HEENT: Pupils equal, extraocular movements intact  Respiratory: Patient's speak in full sentences and does not appear short of breath  Cardiovascular: [name] lower extremity edema, non tender, [name] erythema  Neuro: Cranial nerves II through XII are intact, neurovascularly intact in all extremities with 2+ DTRs and 2+ pulses.  Gait normal with good balance and coordination.  MSK:  tender with full range of motion and good stability and symmetric strength and tone of shoulders, elbows, wrist, hip, knee and ankles bilaterally.  Patient's low back does have significant tightness still noted in the paraspinal musculature right greater than left.  Patient does have tightness with FABER test as well.  Negative straight leg test.  Patient does have some tenderness of the hamstring as well noted today.  Osteopathic findings   T9 extended rotated and side bent left L2  flexed rotated and side bent right Sacrum right on right    Impression and Recommendations:      The above documentation has been reviewed and is accurate and complete Lyndal Pulley, DO       Note: This dictation was prepared with Dragon dictation along with smaller phrase technology. Any transcriptional errors that result from this process are unintentional.

## 2019-06-03 NOTE — Patient Instructions (Signed)
Good luck at states  See me in 5 weeks

## 2019-06-04 MED FILL — ESCITALOPRAM 10 MG TABLET: 10 | 90 days supply | Qty: 90 | Fill #2

## 2019-06-04 MED FILL — CELECOXIB 100 MG CAP: 100 | 30 days supply | Qty: 60 | Fill #1

## 2019-06-18 ENCOUNTER — Telehealth: Payer: Self-pay | Admitting: Family Medicine

## 2019-06-18 NOTE — Telephone Encounter (Signed)
Massage is a good idea, only other thing is we may need MRI if this continues

## 2019-06-18 NOTE — Telephone Encounter (Signed)
Called patient and informed

## 2019-06-18 NOTE — Telephone Encounter (Signed)
Patient called stating that she is playing tennis at [facility] and hurt her hip (again). She wanted to know if there is anything should could do to help it and to be able to keep playing. She mentioned a massage or chiropractor?

## 2019-06-24 ENCOUNTER — Other Ambulatory Visit: Payer: Self-pay | Admitting: Family Medicine

## 2019-06-28 DIAGNOSIS — M25551 Pain in right hip: Secondary | ICD-10-CM | POA: Diagnosis not present

## 2019-07-06 MED FILL — HUMIRA CF 40 MG/0.4ML PSKT: 40 | 28 days supply | Qty: 2 | Fill #3

## 2019-07-08 ENCOUNTER — Other Ambulatory Visit: Payer: Self-pay

## 2019-07-08 ENCOUNTER — Encounter: Payer: Self-pay | Admitting: Family Medicine

## 2019-07-08 ENCOUNTER — Ambulatory Visit (INDEPENDENT_AMBULATORY_CARE_PROVIDER_SITE_OTHER): Payer: 59 | Admitting: Family Medicine

## 2019-07-08 VITALS — BP 118/76 | HR 68 | Ht 62.0 in | Wt 144.0 lb

## 2019-07-08 DIAGNOSIS — M5416 Radiculopathy, lumbar region: Secondary | ICD-10-CM

## 2019-07-08 DIAGNOSIS — M999 Biomechanical lesion, unspecified: Secondary | ICD-10-CM

## 2019-07-08 DIAGNOSIS — M25552 Pain in left hip: Secondary | ICD-10-CM | POA: Diagnosis not present

## 2019-07-08 NOTE — Patient Instructions (Addendum)
Good to see you.  askling exercises given. Continue all vitamins and medications. Buy compression sleeve with any working out or tennis. See me again in four weeks.

## 2019-07-08 NOTE — Progress Notes (Signed)
    [name] is a 59 y.o. female who presents to [place] at [facility] today for 5 week follow up of hip pain.  06/03/2019 [name] is a 59 y.o. female coming in with complaint of back pain and piriformis pain. Patient states that she stepped wrong after the injection so has not had as much relief as she hoped. Pain is not as intense but is constant. Pain right side.   Right shoulder bothering her today. Posterior aspect. New problem. Does not hurt with serving.  Patient states that it is just more of a dull throbbing ache  Update 07/08/2019 Patient had a cortisone shot at her orthopedist. States they think it may be her hamstring. Patient states that she has been feeling better since she had her shot after her injury. States it is not hurting all the time.  Patient continues to have pain that is made her concerned enough that he does not play tennis at this time.  Patient is wondering what other steps to do.  Patient's corticosteroid injection into the hamstring was greater than 10 days ago  Denies fever, chills, nausea vomiting abdominal pain, dysuria, chest pain, shortness of breath dyspnea on exertion or numbness in extremities    Exam:  BP 118/76 (BP Location: Left Arm, Patient Position: Sitting, Cuff Size: Normal)   Pulse 68   Ht 5' 2"  (1.575 m)   Wt 144 lb (65.3 kg)   LMP 01/10/2014   SpO2 97%   BMI 26.34 kg/m  General: Well Developed, well nourished, and in no acute distress.   MSK: Alert and oriented x3, Back exam does have some tenderness to palpation in the paraspinal musculature of the lumbar spine greater than left.  Negative straight leg test.  Positive Faber. Patient does have pain noted in the paraspinal musculature and does have some discomfort though noted at the proximal aspect actually by hamstrings bilaterally.  Osteopathic findings T5 extended rotated and side bent left L2 flexed rotated and side bent right Sacrum right on  right

## 2019-07-09 ENCOUNTER — Encounter: Payer: Self-pay | Admitting: Family Medicine

## 2019-07-09 DIAGNOSIS — M5416 Radiculopathy, lumbar region: Secondary | ICD-10-CM | POA: Insufficient documentation

## 2019-07-09 NOTE — Assessment & Plan Note (Signed)
I am concerned enough at this time that I do feel that patient is back pain even in the leg tightness of the hamstrings is more secondary to a lumbar radiculopathy and encouraged her to consider the possibility of advanced imaging.  Patient wants to avoid that at the moment.  Discussed icing regimen, home exercise, which activities to do which wants to avoid.  Patient is to increase activity slowly.  We do want her to try to be active if possible but if worsening pain I do feel that advanced imaging would be warranted to make sure your treating appropriately.  Still responding well to osteopathic manipulation.  Discussed Zanaflex for breakthrough pain.

## 2019-07-09 NOTE — Assessment & Plan Note (Addendum)

## 2019-07-12 DIAGNOSIS — G4733 Obstructive sleep apnea (adult) (pediatric): Secondary | ICD-10-CM | POA: Diagnosis not present

## 2019-07-15 MED FILL — busPIRone HCL 7.5 MG TABS: 7.5 | 90 days supply | Qty: 180 | Fill #2

## 2019-07-19 ENCOUNTER — Encounter (HOSPITAL_COMMUNITY): Payer: Self-pay | Admitting: Emergency Medicine

## 2019-07-19 ENCOUNTER — Other Ambulatory Visit: Payer: Self-pay

## 2019-07-19 ENCOUNTER — Ambulatory Visit (HOSPITAL_COMMUNITY)
Admission: EM | Admit: 2019-07-19 | Discharge: 2019-07-19 | Disposition: A | Discharge status: Home / Self Care | Payer: 59 | Attending: Family Medicine | Admitting: Family Medicine

## 2019-07-19 DIAGNOSIS — W540XXA Bitten by dog, initial encounter: Secondary | ICD-10-CM

## 2019-07-19 DIAGNOSIS — S61451A Open bite of right hand, initial encounter: Secondary | ICD-10-CM

## 2019-07-19 MED ORDER — AMOXICILLIN-POT CLAVULANATE 875-125 MG PO TABS: 1.0000 | ORAL_TABLET | Freq: Two times a day (BID) | ORAL | 0 refills | Status: AC

## 2019-07-19 NOTE — Discharge Instructions (Signed)
Begin augmentin twice daily for 1 week Take with food Tylenol and ibuprofen for pain Keep clean and dry Follow up for any concerns

## 2019-07-19 NOTE — ED Triage Notes (Signed)
Pt here for dog bite to left index finger today; dog in up to date on rabies; small puncture noted

## 2019-07-19 NOTE — ED Provider Notes (Signed)
[name]    CSN: [id number] Arrival date & time: 07/19/19  1057      History   Chief Complaint Chief Complaint  Patient presents with  . Animal Bite    HPI [name] is a 59 y.o. female presenting today for evaluation of a dog bite.  Patient had a dog bed at around 9 AM this morning.  Sustained small puncture wound to left distal index finger.  Denies any difficulty bending finger.  Dog is up-to-date on vaccines.  Last tetanus in 2019.  Patient concerned about infection.  Denies fevers.  HPI  Past Medical History:  Diagnosis Date  . Anxiety   . Crohn disease ([place])   . Depression   . GERD (gastroesophageal reflux disease)   . History of methicillin resistant staphylococcus aureus (MRSA)   . Sleep apnea    CPAP    Patient Active Problem List   Diagnosis Date Noted  . Lumbar radiculopathy 07/09/2019  . Contusion of right hip and thigh 04/20/2019  . Loss of transverse plantar arch 02/11/2019  . Sprain of iliolumbar ligament 10/29/2018  . Piriformis syndrome of right side 09/30/2018  . Nonallopathic lesion of sacral region 09/30/2018  . Nonallopathic lesion of lumbosacral region 09/30/2018  . Nonallopathic lesion of thoracic region 09/30/2018  . Peroneal tendinitis of left lower extremity 02/11/2018  . Polyarthralgia 02/11/2018  . Patellofemoral syndrome of right knee 02/11/2018  . Gastroesophageal reflux disease 01/16/2017  . Cough variant asthma 01/16/2017  . Noncompliance with medication treatment due to intermittent use of medication 01/16/2017  . Allergic rhinoconjunctivitis 11/08/2015  . Sinobronchitis 10/03/2015  . OSA (obstructive sleep apnea) 12/14/2014  . Crohn's disease of colon ([place]) 05/24/2014  . Depression 02/01/2014  . Disorder of intestine 12/17/2010  . Anorectal polyp 12/17/2010  . Glaucoma 01/14/2010  . Dry skin dermatitis 01/15/2008  . Crohn's disease ([place]) 01/14/2005    Past Surgical History:  Procedure Laterality Date    . CESAREAN SECTION    . LIGAMENT REPAIR Right 11/23/2015   Procedure: right index radial collateral LIGAMENT REPAIR;  Surgeon: Leanora Cover, MD;  Location: [facility];  Service: Orthopedics;  Laterality: Right;    OB History   No obstetric history on file.      Home Medications    Prior to Admission medications   Medication Sig Start Date End Date Taking? Authorizing Provider  Adalimumab (HUMIRA) 40 MG/0.4ML PSKT Inject 0.4 mLs into the skin every 14 (fourteen) days. 02/16/19   Tresa Garter, MD  amoxicillin-clavulanate (AUGMENTIN) 875-125 MG tablet Take 1 tablet by mouth every 12 (twelve) hours for 7 days. 07/19/19 07/26/19  Cecilia Vancleve C, PA-C  busPIRone (BUSPAR) 7.5 MG tablet TAKE 1 TABLET BY MOUTH TWICE A DAY 12/22/18   Elby Showers, MD  cetirizine (ZYRTEC) 10 MG tablet Take by mouth.    [provider]  clotrimazole-betamethasone (LOTRISONE) cream Apply 1 application topically 2 (two) times daily. 12/29/15   Elby Showers, MD  colestipol (COLESTID) 1 g tablet Take 1 g by mouth 2 (two) times daily.    [provider]  escitalopram (LEXAPRO) 10 MG tablet TAKE 1 TABLET BY MOUTH DAILY. 11/16/18   Elby Showers, MD  famotidine (PEPCID) 20 MG tablet Take 20 mg by mouth daily.    [provider]  fluocinonide ointment (LIDEX) 8.56 % Apply 1 application topically daily as needed.    [provider]  levonorgestrel-ethinyl estradiol (AVIANE,ALESSE,LESSINA) 0.1-20 MG-MCG tablet Take by  mouth.    [provider]  LUMIGAN 0.01 % SOLN Place 1 drop into both eyes at bedtime. 03/18/17   [provider]  predniSONE (DELTASONE) 50 MG tablet 1 pill a day Patient not taking: Reported on 07/19/2019 10/29/18   Lyndal Pulley, DO  tiZANidine (ZANAFLEX) 4 MG tablet TAKE 1 TABLET BY MOUTH AT NIGHT 12/12/18   Lyndal Pulley, DO  Vitamin D, Ergocalciferol, (DRISDOL) 1.25 MG (50000 UNIT) CAPS capsule TAKE 1 CAPSULE BY MOUTH EVERY 7  DAYS. 06/24/19   Lyndal Pulley, DO    Family History Family History  Problem Relation Age of Onset  . Alcohol abuse Mother   . Alcohol abuse Sister   . Angioedema Neg Hx   . Allergic rhinitis Neg Hx   . Asthma Neg Hx   . Atopy Neg Hx   . Eczema Neg Hx   . Immunodeficiency Neg Hx   . Urticaria Neg Hx     Social History Social History   Tobacco Use  . Smoking status: Never Smoker  . Smokeless tobacco: Never Used  Substance Use Topics  . Alcohol use: Yes    Comment: 10 glasses   . Drug use: No     Allergies   Chlorhexidine gluconate and Hydrocodone   Review of Systems Review of Systems  Constitutional: Negative for fatigue and fever.  Eyes: Negative for visual disturbance.  Respiratory: Negative for shortness of breath.   Cardiovascular: Negative for chest pain.  Gastrointestinal: Negative for abdominal pain, nausea and vomiting.  Musculoskeletal: Negative for arthralgias and joint swelling.  Skin: Positive for wound. Negative for color change and rash.  Neurological: Negative for dizziness, weakness, light-headedness and headaches.     Physical Exam Triage Vital Signs ED Triage Vitals [07/19/19 1143]  Enc Vitals Group     BP (!) 154/84     Pulse Rate 69     Resp 18     Temp 98.7 F (37.1 C)     Temp Source Oral     SpO2 100 %     Weight      Height      Head Circumference      Peak Flow      Pain Score 5     Pain Loc      Pain Edu?      Excl. in [place]?    No data found.  Updated Vital Signs BP (!) 154/84 (BP Location: Right Arm)   Pulse 69   Temp 98.7 F (37.1 C) (Oral)   Resp 18   LMP 01/10/2014   SpO2 100%   Visual Acuity Right Eye Distance:   Left Eye Distance:   Bilateral Distance:    Right Eye Near:   Left Eye Near:    Bilateral Near:     Physical Exam Vitals and nursing note reviewed.  Constitutional:      Appearance: She is well-developed.     Comments: No acute distress  HENT:     Head: Normocephalic and atraumatic.      Nose: Nose normal.  Eyes:     Conjunctiva/sclera: Conjunctivae normal.  Cardiovascular:     Rate and Rhythm: Normal rate.  Pulmonary:     Effort: Pulmonary effort is normal. No respiratory distress.  Abdominal:     General: There is no distension.  Musculoskeletal:        General: Normal range of motion.     Cervical back: Neck supple.  Skin:    General:  Skin is warm and dry.     Comments: Puncture wound noted to distal left index finger, bleeding controlled, no surrounding erythema  Neurological:     Mental Status: She is alert and oriented to person, place, and time.      UC Treatments / Results  Labs (all labs ordered are listed, but only abnormal results are displayed) Labs Reviewed - No data to display  EKG   Radiology No results found.  Procedures Procedures (including critical care time)  Medications Ordered in UC Medications - No data to display  Initial Impression / Assessment and Plan / UC Course  I have reviewed the triage vital signs and the nursing notes.  Pertinent labs & imaging results that were available during my care of the patient were reviewed by me and considered in my medical decision making (see chart for details).     Puncture wound from dog bite, up-to-date on vaccines, tetanus up-to-date.  Discussed wound care, Augmentin for prevention of infection.  Monitor for gradual healing.  Discussed strict return precautions. Patient verbalized understanding and is agreeable with plan.  Final Clinical Impressions(s) / UC Diagnoses   Final diagnoses:  Dog bite of right hand, initial encounter     Discharge Instructions     Begin augmentin twice daily for 1 week Take with food Tylenol and ibuprofen for pain Keep clean and dry Follow up for any concerns    ED Prescriptions    Medication Sig Dispense Auth. Provider   amoxicillin-clavulanate (AUGMENTIN) 875-125 MG tablet Take 1 tablet by mouth every 12 (twelve) hours for 7 days. 14  tablet Hanadi Stanly, [place] C, PA-C     PDMP not reviewed this encounter.   Janith Lima, PA-C 07/19/19 1227

## 2019-07-20 MED FILL — FLUCONAZOLE 200 MG TABLET: 200 | 27 days supply | Qty: 4 | Fill #1

## 2019-08-09 ENCOUNTER — Ambulatory Visit (INDEPENDENT_AMBULATORY_CARE_PROVIDER_SITE_OTHER): Payer: 59

## 2019-08-09 ENCOUNTER — Ambulatory Visit (INDEPENDENT_AMBULATORY_CARE_PROVIDER_SITE_OTHER): Payer: 59 | Admitting: Family Medicine

## 2019-08-09 ENCOUNTER — Other Ambulatory Visit: Payer: Self-pay

## 2019-08-09 VITALS — BP 102/72 | HR 70 | Ht 62.0 in | Wt 147.0 lb

## 2019-08-09 DIAGNOSIS — M5416 Radiculopathy, lumbar region: Secondary | ICD-10-CM | POA: Diagnosis not present

## 2019-08-09 DIAGNOSIS — M545 Low back pain, unspecified: Secondary | ICD-10-CM

## 2019-08-09 DIAGNOSIS — G8929 Other chronic pain: Secondary | ICD-10-CM

## 2019-08-09 DIAGNOSIS — M999 Biomechanical lesion, unspecified: Secondary | ICD-10-CM

## 2019-08-09 NOTE — Patient Instructions (Signed)
Great to see you  [name] is yoru friend Lets get an xray and MRI now at this time.  After MRI I will write you to see if epidural is possible  No change in medicine  Have a follow up in 4-6 weeks

## 2019-08-09 NOTE — Progress Notes (Signed)
[place] Phone: [phone] Subjective:   [name], am serving as a scribe for Dr. Hulan Saas. This visit occurred during the SARS-CoV-2 public health emergency.  Safety protocols were in place, including screening questions prior to the visit, additional usage of staff PPE, and extensive cleaning of exam room while observing appropriate contact time as indicated for disinfecting solutions.   I'm seeing this patient by the request  of:  [name], Cresenciano Lick, MD  CC: Back pain follow-up  [id number]  [name] is a 59 y.o. female coming in with complaint of back and neck pain. OMT 07/08/2019. Patient states that her back pain is the same as last visit. Prolonged sitting and walking increases her pain.  Patient states there is [name] further issues without pain.  Past medical history is significant for Crohn's disease.  Medications patient has been prescribed: Zanaflex vitamin D  Taking: Yes         Reviewed prior external information including notes and imaging from previsou exam, outside providers and external EMR if available.   As well as notes that were available from care everywhere and other healthcare systems.  Past medical history, social, surgical and family history all reviewed in electronic medical record.  [name] pertanent information unless stated regarding to the chief complaint.   Past Medical History:  Diagnosis Date  . Anxiety   . Crohn disease ([place])   . Depression   . GERD (gastroesophageal reflux disease)   . History of methicillin resistant staphylococcus aureus (MRSA)   . Sleep apnea    CPAP    Allergies  Allergen Reactions  . Chlorhexidine Gluconate Itching  . Hydrocodone Itching     Review of Systems:  [name] headache, visual changes, nausea, vomiting, diarrhea, constipation, dizziness, abdominal pain, skin rash, fevers, chills, night sweats, weight loss, swollen lymph  nodes, body aches, joint swelling, chest pain, shortness of breath, mood changes. POSITIVE muscle aches  Objective  Last menstrual period 01/10/2014.   General: [name] apparent distress alert and oriented x3 mood and affect normal, dressed appropriately.  HEENT: Pupils equal, extraocular movements intact  Respiratory: Patient's speak in full sentences and does not appear short of breath  Cardiovascular: [name] lower extremity edema, non tender, [name] erythema  Neuro: Cranial nerves II through XII are intact, neurovascularly intact in all extremities with 2+ DTRs and 2+ pulses.  Gait normal with good balance and coordination.  MSK:  Non tender with full range of motion and good stability and symmetric strength and tone of shoulders, elbows, wrist, hip, knee and ankles bilaterally.  Back -back exam has some increase in tightness noted of the hamstrings bilaterally.  Patient does have a positive FABER test right greater than left.  Neurovascularly intact distally.  Patient seems to be more tender than previous exam.  Questionable positive lumbar radiculopathy down the right leg with forward flexion and 15 degrees.  Osteopathic findings  T7 extended rotated and side bent right L2 flexed rotated and side bent right Sacrum right on right       Assessment and Plan:  Lumbar radiculopathy Patient has done relatively well with conservative therapy but does not seem to be improving anymore at this time.  Patient is now avoiding different activities that she likes to do such as tennis secondary to the discomfort and pain.  I do believe at this point advanced imaging is warranted.  With history of Crohn's disease do need to  rule out the possibility of ankylosing spondylitis.  We will see with MRI.  Continue same medications including the Zanaflex.  Could do another prednisone but would like to avoid that if possible.  Responded decently to manipulation today.  We will follow up after imaging and discuss medical  options.   Nonallopathic problems  Decision today to treat with OMT was based on Physical Exam  After verbal consent patient was treated with HVLA, ME, FPR techniques in  thoracic, lumbar, and sacral  areas  Patient tolerated the procedure well with improvement in symptoms  Patient given exercises, stretches and lifestyle modifications  See medications in patient instructions if given  Patient will follow up in 4-8 weeks      The above documentation has been reviewed and is accurate and complete Lyndal Pulley, DO       Note: This dictation was prepared with Dragon dictation along with smaller phrase technology. Any transcriptional errors that result from this process are unintentional.

## 2019-08-10 ENCOUNTER — Encounter: Payer: Self-pay | Admitting: Family Medicine

## 2019-08-10 NOTE — Assessment & Plan Note (Signed)
Patient has done relatively well with conservative therapy but does not seem to be improving anymore at this time.  Patient is now avoiding different activities that she likes to do such as tennis secondary to the discomfort and pain.  I do believe at this point advanced imaging is warranted.  With history of Crohn's disease do need to rule out the possibility of ankylosing spondylitis.  We will see with MRI.  Continue same medications including the Zanaflex.  Could do another prednisone but would like to avoid that if possible.  Responded decently to manipulation today.  We will follow up after imaging and discuss medical options.

## 2019-08-11 MED FILL — HUMIRA CF 40 MG/0.4ML PSKT: 40 | 28 days supply | Qty: 2 | Fill #4

## 2019-08-13 MED FILL — CELECOXIB 100 MG CAP: 100 | 30 days supply | Qty: 60 | Fill #2

## 2019-08-16 MED FILL — ESTRADIOL-NORETHINDRONE ACE: 1-0.5 | 84 days supply | Qty: 84 | Fill #3

## 2019-09-06 ENCOUNTER — Ambulatory Visit
Admission: RE | Admit: 2019-09-06 | Discharge: 2019-09-06 | Disposition: A | Discharge status: Home / Self Care | Payer: 59 | Source: Ambulatory Visit | Attending: Family Medicine | Admitting: Family Medicine

## 2019-09-06 ENCOUNTER — Other Ambulatory Visit: Payer: Self-pay

## 2019-09-06 DIAGNOSIS — M47816 Spondylosis without myelopathy or radiculopathy, lumbar region: Secondary | ICD-10-CM | POA: Diagnosis not present

## 2019-09-06 DIAGNOSIS — M5126 Other intervertebral disc displacement, lumbar region: Secondary | ICD-10-CM | POA: Diagnosis not present

## 2019-09-06 DIAGNOSIS — G8929 Other chronic pain: Secondary | ICD-10-CM

## 2019-09-06 DIAGNOSIS — M4807 Spinal stenosis, lumbosacral region: Secondary | ICD-10-CM | POA: Diagnosis not present

## 2019-09-06 DIAGNOSIS — M545 Low back pain, unspecified: Secondary | ICD-10-CM

## 2019-09-07 ENCOUNTER — Encounter: Payer: Self-pay | Admitting: Family Medicine

## 2019-09-07 MED FILL — ESCITALOPRAM 10 MG TABLET: 10 | 90 days supply | Qty: 90 | Fill #3

## 2019-09-08 ENCOUNTER — Other Ambulatory Visit: Payer: Self-pay

## 2019-09-08 ENCOUNTER — Encounter: Payer: Self-pay | Admitting: Family Medicine

## 2019-09-08 ENCOUNTER — Ambulatory Visit (INDEPENDENT_AMBULATORY_CARE_PROVIDER_SITE_OTHER): Payer: 59 | Admitting: Family Medicine

## 2019-09-08 VITALS — BP 122/76 | HR 65 | Ht 62.0 in | Wt 150.0 lb

## 2019-09-08 DIAGNOSIS — M5416 Radiculopathy, lumbar region: Secondary | ICD-10-CM

## 2019-09-08 DIAGNOSIS — M999 Biomechanical lesion, unspecified: Secondary | ICD-10-CM

## 2019-09-08 NOTE — Assessment & Plan Note (Signed)
MRI of the back does show some foraminal narrowing moderate in nature.  We discussed that continuing to have pain consider the epidural before we consider MRI pelvis to further evaluate the piriformis and the hamstring tendons.  I believe the patient has been making progress and is having very minimal pain.  Responding well to manipulation otherwise.  Patient is going to start increasing activity again and hope that patient does not have any exacerbation.  Follow-up again in 6 weeks

## 2019-09-08 NOTE — Progress Notes (Signed)
[place] Phone: [phone] Subjective:   [name], am serving as a scribe for Dr. Hulan Saas. This visit occurred during the SARS-CoV-2 public health emergency.  Safety protocols were in place, including screening questions prior to the visit, additional usage of staff PPE, and extensive cleaning of exam room while observing appropriate contact time as indicated for disinfecting solutions.   I'm seeing this patient by the request  of:  [name], Cresenciano Lick, MD  CC: Low back pain  [id number]  [name] is a 59 y.o. female coming in with complaint of back and neck pain. OMT 08/09/2019. Patient states that her back and neck pain have increased after being at the beach, sleeping in another bed and cycling.   Medications patient has been prescribed: Zanaflex  Taking: Intermittently but not recently     Patient did have an MRI of the lumbar spine that was independently visualized and interpreted by me.  Spondylosis throughout the back but worse at L3-L5 foraminal narrowing at multiple levels mild to moderate in nature and mostly bilateral    Reviewed prior external information including notes and imaging from previsou exam, outside providers and external EMR if available.   As well as notes that were available from care everywhere and other healthcare systems.  Past medical history, social, surgical and family history all reviewed in electronic medical record.  No pertanent information unless stated regarding to the chief complaint.   Past Medical History:  Diagnosis Date  . Anxiety   . Crohn disease ([employer])   . Depression   . GERD (gastroesophageal reflux disease)   . History of methicillin resistant staphylococcus aureus (MRSA)   . Sleep apnea    CPAP    Allergies  Allergen Reactions  . Chlorhexidine Gluconate Itching  . Hydrocodone Itching     Review of Systems:  No headache, visual  changes, nausea, vomiting, diarrhea, constipation, dizziness, abdominal pain, skin rash, fevers, chills, night sweats, weight loss, swollen lymph nodes, body aches, joint swelling, chest pain, shortness of breath, mood changes. POSITIVE muscle aches  Objective  Blood pressure 122/76, pulse 65, height 5' 2"  (1.575 m), weight 150 lb (68 kg), last menstrual period 01/10/2014, SpO2 97 %.   General: No apparent distress alert and oriented x3 mood and affect normal, dressed appropriately.  HEENT: Pupils equal, extraocular movements intact  Respiratory: Patient's speak in full sentences and does not appear short of breath  Cardiovascular: No lower extremity edema, non tender, no erythema  Neuro: Cranial nerves II through XII are intact, neurovascularly intact in all extremities with 2+ DTRs and 2+ pulses.  Gait normal with good balance and coordination.  MSK:  Non tender with full range of motion and good stability and symmetric strength and tone of shoulders, elbows, wrist, hip, knee and ankles bilaterally.  Back -does have some mild loss of lordosis, significant tightness more in the thoracolumbar juncture, still having bilateral sacroiliac pain.  Mild tightness with Corky Sox. Neck exam also shows the patient does have some tenderness to palpation diffusely and some mild increase in tightness.  Osteopathic findings  C2 flexed rotated and side bent right C5 flexed rotated and side bent left T3 extended rotated and side bent right inhaled rib T11 extended rotated and side bent left L2 flexed rotated and side bent right Sacrum right on right       Assessment and Plan:  Lumbar radiculopathy MRI of the back does show  some foraminal narrowing moderate in nature.  We discussed that continuing to have pain consider the epidural before we consider MRI pelvis to further evaluate the piriformis and the hamstring tendons.  I believe the patient has been making progress and is having very minimal pain.   Responding well to manipulation otherwise.  Patient is going to start increasing activity again and hope that patient does not have any exacerbation.  Follow-up again in 6 weeks    Nonallopathic problems  Decision today to treat with OMT was based on Physical Exam  After verbal consent patient was treated with HVLA, ME, FPR techniques in cervical, rib, thoracic, lumbar, and sacral  areas  Patient tolerated the procedure well with improvement in symptoms  Patient given exercises, stretches and lifestyle modifications  See medications in patient instructions if given  Patient will follow up in 4-8 weeks      The above documentation has been reviewed and is accurate and complete Stacey Pulley, DO       Note: This dictation was prepared with Dragon dictation along with smaller phrase technology. Any transcriptional errors that result from this process are unintentional.

## 2019-09-08 NOTE — Patient Instructions (Signed)
Bring handlebars up [place] to play tennis but avoid repetitive extension Take zanaflex for next 3 nights See me in 5-6 weeks

## 2019-09-13 MED FILL — COLESTIPOL HCL 1 GM TABLET: 1 | 30 days supply | Qty: 180 | Fill #4

## 2019-09-16 ENCOUNTER — Telehealth: Payer: Self-pay | Admitting: Pharmacist

## 2019-09-16 NOTE — Telephone Encounter (Signed)
Called patient to schedule an appointment for the [facility]. I was unable to reach the patient so I left a HIPAA-compliant message requesting that the patient return my call.

## 2019-09-21 ENCOUNTER — Encounter: Payer: Self-pay | Admitting: Gastroenterology

## 2019-09-22 ENCOUNTER — Other Ambulatory Visit: Payer: Self-pay

## 2019-09-22 ENCOUNTER — Ambulatory Visit (HOSPITAL_BASED_OUTPATIENT_CLINIC_OR_DEPARTMENT_OTHER): Payer: 59 | Admitting: Pharmacist

## 2019-09-22 DIAGNOSIS — Z79899 Other long term (current) drug therapy: Secondary | ICD-10-CM

## 2019-09-22 NOTE — Progress Notes (Signed)
S: Patient presents today for review of her Humira.  Patient is currently taking Humira for Crohn's Disease. Patient has been managed by Dr. Kathlen Mody at [facility] for this. She is establishing with Dr. Havery Moros at [facility] in November.   Adherence: denies any missed doses of Humira.    Dosing:  40 mg every 14 days.   Screening: TB test: completed prior to initiation  Hepatitis: completed prior to initiation  Efficacy: reports that it is working very well for her and that she has not really had any side effects  Monitoring: S/sx of infection: denies CBC: last one WNL (see care everywhere) S/sx of hypersensitivity: denies S/sx of malignancy: denies S/sx of heart failure: denies  Drug-specific monitoring: Adalimumab levels 5.2 (10/26/2018) Anti-Adalimumab Ab levels: 85 (10/26/2018)   O:  Lab Results  Component Value Date   WBC 4.8 09/25/2018   HGB 13.2 09/25/2018   HCT 39.7 09/25/2018   MCV 93.6 09/25/2018   PLT 249 09/25/2018      Chemistry      Component Value Date/Time   NA 137 09/25/2018 0923   K 4.4 09/25/2018 0923   CL 103 09/25/2018 0923   CO2 23 09/25/2018 0923   BUN 12 09/25/2018 0923   CREATININE 0.81 09/25/2018 0923      Component Value Date/Time   CALCIUM 9.4 09/25/2018 0923   ALKPHOS 31 (L) 02/11/2018 0951   AST 25 09/25/2018 0923   ALT 18 09/25/2018 0923   BILITOT 0.7 09/25/2018 0923       A/P: 1. Medication review: Patient tolerating Humira well and reports good efficacy. All of her labs have been normal and she is regularly following with gastroenterologist at [facility]. She is establishing with [facility] in November. Reviewed the medication with the patient.. No recommendations for changes at this time.   Benard Halsted, PharmD, [place] [phone]

## 2019-09-29 MED FILL — FLUCONAZOLE 200 MG TABLET: 200 | 28 days supply | Qty: 4 | Fill #0

## 2019-09-30 MED FILL — HUMIRA CF 40 MG/0.4ML PSKT: 40 | 28 days supply | Qty: 2 | Fill #5

## 2019-10-07 ENCOUNTER — Other Ambulatory Visit: Payer: Self-pay | Admitting: Family Medicine

## 2019-10-07 MED FILL — VIT D2 1.25 MG (50,000 UNIT: 1.25 MG | 84 days supply | Qty: 12 | Fill #0

## 2019-10-14 DIAGNOSIS — F411 Generalized anxiety disorder: Secondary | ICD-10-CM | POA: Diagnosis not present

## 2019-10-20 ENCOUNTER — Encounter: Payer: Self-pay | Admitting: Family Medicine

## 2019-10-20 ENCOUNTER — Ambulatory Visit (INDEPENDENT_AMBULATORY_CARE_PROVIDER_SITE_OTHER): Payer: 59 | Admitting: Family Medicine

## 2019-10-20 ENCOUNTER — Other Ambulatory Visit: Payer: Self-pay

## 2019-10-20 VITALS — BP 110/72 | HR 80 | Ht 62.0 in | Wt 142.0 lb

## 2019-10-20 DIAGNOSIS — M999 Biomechanical lesion, unspecified: Secondary | ICD-10-CM

## 2019-10-20 DIAGNOSIS — M5416 Radiculopathy, lumbar region: Secondary | ICD-10-CM | POA: Diagnosis not present

## 2019-10-20 NOTE — Patient Instructions (Signed)
Great to see you [place] the [place]  Continue what you are doing See me in 6-7 weeks

## 2019-10-20 NOTE — Assessment & Plan Note (Signed)
Patient is a.  Injection is significantly improved at this time.  We can always repeat we discussed.  Responding well to osteopathic manipulation, no significant change in medications.  Discussed icing regimen and home exercises.  Follow-up again in 4 to 8 weeks

## 2019-10-20 NOTE — Progress Notes (Signed)
[place] Phone: [phone] Subjective:   [name], am serving as a scribe for Dr. Hulan Saas. This visit occurred during the SARS-CoV-2 public health emergency.  Safety protocols were in place, including screening questions prior to the visit, additional usage of staff PPE, and extensive cleaning of exam room while observing appropriate contact time as indicated for disinfecting solutions.    This visit occurred during the SARS-CoV-2 public health emergency.  Safety protocols were in place, including screening questions prior to the visit, additional usage of staff PPE, and extensive cleaning of exam room while observing appropriate contact time as indicated for disinfecting solutions.   I'm seeing this patient by the request  of:  [name], Cresenciano Lick, MD  CC: Low back pain follow-up  [id number]  JUSTYNE ROELL is a 59 y.o. female coming in with complaint of back and neck pain. OMT 09/08/2019. Patient states that she has been walking and playing tennis.  Patient states that it is more of a very mild awareness demonstrated pain at this time.  Feels like the injection has helped him significantly.  Patient is doing relatively well.  Responding well to manipulation previously as well.  Patient has increased activity and feeling good overall  Medications patient has been prescribed: Zanaflex, Vit D  Taking:         Reviewed prior external information including notes and imaging from previsou exam, outside providers and external EMR if available.   As well as notes that were available from care everywhere and other healthcare systems.  Past medical history, social, surgical and family history all reviewed in electronic medical record.  No pertanent information unless stated regarding to the chief complaint.   Past Medical History:  Diagnosis Date   Anxiety    Crohn disease ([place])    Depression     GERD (gastroesophageal reflux disease)    History of methicillin resistant staphylococcus aureus (MRSA)    Sleep apnea    CPAP    Allergies  Allergen Reactions   Chlorhexidine Gluconate Itching   Hydrocodone Itching     Review of Systems:  No headache, visual changes, nausea, vomiting, diarrhea, constipation, dizziness, abdominal pain, skin rash, fevers, chills, night sweats, weight loss, swollen lymph nodes, body aches, joint swelling, chest pain, shortness of breath, mood changes. POSITIVE muscle aches  Objective  Blood pressure 110/72, pulse 80, height 5\' 2"  (1.575 m), weight 142 lb (64.4 kg), last menstrual period 01/10/2014, SpO2 98 %.   General: No apparent distress alert and oriented x3 mood and affect normal, dressed appropriately.  HEENT: Pupils equal, extraocular movements intact  Respiratory: Patient's speak in full sentences and does not appear short of breath  Cardiovascular: No lower extremity edema, non tender, no erythema  Neuro: Cranial nerves II through XII are intact, neurovascularly intact in all extremities with 2+ DTRs and 2+ pulses.  Gait normal with good balance and coordination.  MSK:  Non tender with full range of motion and good stability and symmetric strength and tone of shoulders, elbows, wrist, hip, knee and ankles bilaterally.  Back -low back exam still has some mild tightness around the right sacroiliac joint.  Right piriformis mild tightness.  Negative straight leg test with very mild tightness but no increasing pain with Corky Sox on the right  Osteopathic findings  T9 extended rotated and side bent left L2 flexed rotated and side bent right Sacrum right on right  Assessment and Plan: Lumbar radiculopathy Patient is a.  Injection is significantly improved at this time.  We can always repeat we discussed.  Responding well to osteopathic manipulation, no significant change in medications.  Discussed icing regimen and home exercises.   Follow-up again in 4 to 8 weeks     Nonallopathic problems  Decision today to treat with OMT was based on Physical Exam  After verbal consent patient was treated with HVLA, ME, FPR techniques in  thoracic, lumbar, and sacral  areas  Patient tolerated the procedure well with improvement in symptoms  Patient given exercises, stretches and lifestyle modifications  See medications in patient instructions if given  Patient will follow up in 4-8 weeks      The above documentation has been reviewed and is accurate and complete Lyndal Pulley, DO       Note: This dictation was prepared with Dragon dictation along with smaller phrase technology. Any transcriptional errors that result from this process are unintentional.

## 2019-10-21 DIAGNOSIS — Z1231 Encounter for screening mammogram for malignant neoplasm of breast: Secondary | ICD-10-CM | POA: Diagnosis not present

## 2019-10-25 DIAGNOSIS — F411 Generalized anxiety disorder: Secondary | ICD-10-CM | POA: Diagnosis not present

## 2019-10-26 ENCOUNTER — Other Ambulatory Visit (HOSPITAL_COMMUNITY): Payer: Self-pay | Admitting: Ophthalmology

## 2019-10-26 MED FILL — LUMIGAN 0.01% EYE DROPS: 0.01 | 25 days supply | Qty: 3 | Fill #0

## 2019-10-27 MED FILL — HUMIRA CF 40 MG/0.4ML PSKT: 40 | 28 days supply | Qty: 2 | Fill #6

## 2019-11-01 ENCOUNTER — Other Ambulatory Visit: Payer: Self-pay | Admitting: Internal Medicine

## 2019-11-01 ENCOUNTER — Other Ambulatory Visit: Payer: Self-pay

## 2019-11-01 ENCOUNTER — Telehealth: Payer: Self-pay | Admitting: Internal Medicine

## 2019-11-01 ENCOUNTER — Telehealth: Payer: Self-pay | Admitting: *Deleted

## 2019-11-01 MED ORDER — CELECOXIB 100 MG PO CAPS: 100.0000 mg | ORAL_CAPSULE | Freq: Every day | ORAL | 0 refills | Status: AC

## 2019-11-01 MED ORDER — CELECOXIB 100 MG PO CAPS: 100.0000 mg | ORAL_CAPSULE | Freq: Every day | ORAL | 3 refills | Status: DC

## 2019-11-01 MED FILL — busPIRone HCL 7.5 MG TABS: 7.5 | 90 days supply | Qty: 180 | Fill #0

## 2019-11-01 MED FILL — CELECOXIB 100 MG CAP: 100 | 30 days supply | Qty: 30 | Fill #0

## 2019-11-01 NOTE — Telephone Encounter (Signed)
Pt called stating that she is going out of town and would like a refill for celebrex. She stated that her GI dr originally prescribed this but she is currently in the process of changing providers. [facility]

## 2019-11-01 NOTE — Telephone Encounter (Signed)
Per a verbal from Dr. Tamala Julian, ok to refill 165m Celebrex.  Sent patient MyChart message advising not to take any other NSAIDs with Celebrex.

## 2019-11-01 NOTE — Telephone Encounter (Signed)
Please refill Celebrex for 30 days. 100 mg twice a day with no refills and let her know.

## 2019-11-01 NOTE — Telephone Encounter (Signed)
[name] [phone]  Jackelyn Poling called to see if you would refill below medication. This is a medication that she normally gets from her GI doctor in [place], but she is changing GI doctors and does not have an appointment with the new one until next month. I let her know she had not been here since 09/2018, and she was past due for her CPE, so I scheduled it and this was not a medication you had every prescribed, so she would need an office visit if it was even something you would prescribe for her, she said she needed it for arthritis that she was going hiking on Wednesday and needed it for that.  celecoxib (CELEBREX) 100 MG capsule

## 2019-11-01 NOTE — Telephone Encounter (Signed)
Rx was sent in, patient notified.

## 2019-11-02 ENCOUNTER — Other Ambulatory Visit (HOSPITAL_COMMUNITY): Payer: Self-pay | Admitting: Obstetrics & Gynecology

## 2019-11-02 DIAGNOSIS — Z01419 Encounter for gynecological examination (general) (routine) without abnormal findings: Secondary | ICD-10-CM | POA: Diagnosis not present

## 2019-11-02 DIAGNOSIS — Z6825 Body mass index (BMI) 25.0-25.9, adult: Secondary | ICD-10-CM | POA: Diagnosis not present

## 2019-11-02 MED FILL — ESTRADIOL-NORETHINDRONE ACE: 1-0.5 | 84 days supply | Qty: 84 | Fill #0

## 2019-11-09 DIAGNOSIS — G4733 Obstructive sleep apnea (adult) (pediatric): Secondary | ICD-10-CM | POA: Diagnosis not present

## 2019-11-11 DIAGNOSIS — R059 Cough, unspecified: Secondary | ICD-10-CM | POA: Diagnosis not present

## 2019-11-11 DIAGNOSIS — Z1152 Encounter for screening for COVID-19: Secondary | ICD-10-CM | POA: Diagnosis not present

## 2019-11-16 DIAGNOSIS — R197 Diarrhea, unspecified: Secondary | ICD-10-CM | POA: Diagnosis not present

## 2019-11-16 DIAGNOSIS — Z1211 Encounter for screening for malignant neoplasm of colon: Secondary | ICD-10-CM | POA: Diagnosis not present

## 2019-11-16 DIAGNOSIS — Z1321 Encounter for screening for nutritional disorder: Secondary | ICD-10-CM | POA: Diagnosis not present

## 2019-11-16 DIAGNOSIS — Z79899 Other long term (current) drug therapy: Secondary | ICD-10-CM | POA: Diagnosis not present

## 2019-11-16 DIAGNOSIS — Z5181 Encounter for therapeutic drug level monitoring: Secondary | ICD-10-CM | POA: Diagnosis not present

## 2019-11-16 DIAGNOSIS — K50118 Crohn's disease of large intestine with other complication: Secondary | ICD-10-CM | POA: Diagnosis not present

## 2019-11-20 ENCOUNTER — Other Ambulatory Visit: Payer: Self-pay | Admitting: Internal Medicine

## 2019-11-21 ENCOUNTER — Other Ambulatory Visit: Payer: Self-pay | Admitting: Internal Medicine

## 2019-11-22 ENCOUNTER — Other Ambulatory Visit (HOSPITAL_COMMUNITY): Payer: Self-pay | Admitting: Internal Medicine

## 2019-11-22 MED FILL — COLESTIPOL HCL 1 GM TABLET: 1 | 30 days supply | Qty: 180 | Fill #0

## 2019-11-22 MED FILL — ESCITALOPRAM 10 MG TABLET: 10 | 90 days supply | Qty: 90 | Fill #0

## 2019-11-23 ENCOUNTER — Ambulatory Visit: Payer: 59 | Admitting: Gastroenterology

## 2019-11-25 MED FILL — HUMIRA CF 40 MG/0.4ML PSKT: 40 | 28 days supply | Qty: 2 | Fill #7

## 2019-11-30 ENCOUNTER — Other Ambulatory Visit: Payer: Self-pay

## 2019-11-30 ENCOUNTER — Encounter: Payer: Self-pay | Admitting: Family Medicine

## 2019-11-30 ENCOUNTER — Ambulatory Visit (INDEPENDENT_AMBULATORY_CARE_PROVIDER_SITE_OTHER): Payer: 59 | Admitting: Family Medicine

## 2019-11-30 VITALS — BP 118/82 | HR 72 | Ht 62.0 in

## 2019-11-30 DIAGNOSIS — G5701 Lesion of sciatic nerve, right lower limb: Secondary | ICD-10-CM

## 2019-11-30 DIAGNOSIS — M999 Biomechanical lesion, unspecified: Secondary | ICD-10-CM

## 2019-11-30 NOTE — Assessment & Plan Note (Signed)
Continue on medications including the Celebrex if needed, patient is monitoring her stomach.  Zanaflex as needed follow-up 6 weeks

## 2019-11-30 NOTE — Progress Notes (Signed)
[place] Phone: [phone] Subjective:   [name], am serving as a scribe for Dr. Hulan Saas. This visit occurred during the SARS-CoV-2 public health emergency.  Safety protocols were in place, including screening questions prior to the visit, additional usage of staff PPE, and extensive cleaning of exam room while observing appropriate contact time as indicated for disinfecting solutions.  I'm seeing this patient by the request  of:  [name], Cresenciano Lick, MD  CC: Neck pain and back pain follow-up  [id number]  CARESSA SCEARCE is a 59 y.o. female coming in with complaint of back and neck pain. OMT 10/20/2019. Patient states that she has been having some issues with her neck due to sleeping with CPAP. Pain is over left side.   Hip pain has improved. Pain with walking.   Medications patient has been prescribed: Vit D, Prednisone, Zanaflex  Taking: Yes not the prednisone         Reviewed prior external information including notes and imaging from previsou exam, outside providers and external EMR if available.   As well as notes that were available from care everywhere and other healthcare systems.  Past medical history, social, surgical and family history all reviewed in electronic medical record.  No pertanent information unless stated regarding to the chief complaint.   Past Medical History:  Diagnosis Date  . Anxiety   . Crohn disease ([place])   . Depression   . GERD (gastroesophageal reflux disease)   . History of methicillin resistant staphylococcus aureus (MRSA)   . Sleep apnea    CPAP    Allergies  Allergen Reactions  . Chlorhexidine Gluconate Itching  . Hydrocodone Itching     Review of Systems:  No headache, visual changes, nausea, vomiting, diarrhea, constipation, dizziness, abdominal pain, skin rash, fevers, chills, night sweats, weight loss, swollen lymph nodes, body aches, joint  swelling, chest pain, shortness of breath, mood changes. POSITIVE muscle aches mild  Objective  Last menstrual period 01/10/2014.   General: No apparent distress alert and oriented x3 mood and affect normal, dressed appropriately.  HEENT: Pupils equal, extraocular movements intact  Respiratory: Patient's speak in full sentences and does not appear short of breath  Cardiovascular: No lower extremity edema, non tender, no erythema  Neuro: Cranial nerves II through XII are intact, neurovascularly intact in all extremities with 2+ DTRs and 2+ pulses.  Gait normal with good balance and coordination.  MSK:  Non tender with full range of motion and good stability and symmetric strength and tone of shoulders, elbows, wrist, hip, knee and ankles bilaterally.  Neck exam does show some mild loss of lordosis.  Some negative Spurling's test noted today.  Tightness noted on the left side of the neck.  Low back does still have tenderness over the right sacroiliac joint.  Mild positive Corky Sox.  Patient does have tenderness in the piriformis as well.  Negative straight leg test. Osteopathic findings  C6 flexed rotated and side bent left T7 extended rotated and side bent right inhaled rib L2 flexed rotated and side bent right Sacrum right on right       Assessment and Plan:  Piriformis syndrome of right side Continue on medications including the Celebrex if needed, patient is monitoring her stomach.  Zanaflex as needed follow-up 6 weeks    Nonallopathic problems  Decision today to treat with OMT was based on Physical Exam  After verbal consent patient was treated with  HVLA, ME, FPR techniques in cervical, rib, thoracic, lumbar, and sacral  areas  Patient tolerated the procedure well with improvement in symptoms  Patient given exercises, stretches and lifestyle modifications  See medications in patient instructions if given  Patient will follow up in 4-8 weeks      The above  documentation has been reviewed and is accurate and complete Lyndal Pulley, DO       Note: This dictation was prepared with Dragon dictation along with smaller phrase technology. Any transcriptional errors that result from this process are unintentional.

## 2019-11-30 NOTE — Patient Instructions (Signed)
Coop pillow You are awesome See me in 6-8 weeks

## 2019-12-06 DIAGNOSIS — H16423 Pannus (corneal), bilateral: Secondary | ICD-10-CM | POA: Diagnosis not present

## 2019-12-06 DIAGNOSIS — H524 Presbyopia: Secondary | ICD-10-CM | POA: Diagnosis not present

## 2019-12-06 DIAGNOSIS — H2513 Age-related nuclear cataract, bilateral: Secondary | ICD-10-CM | POA: Diagnosis not present

## 2019-12-06 DIAGNOSIS — H52202 Unspecified astigmatism, left eye: Secondary | ICD-10-CM | POA: Diagnosis not present

## 2019-12-06 DIAGNOSIS — H40013 Open angle with borderline findings, low risk, bilateral: Secondary | ICD-10-CM | POA: Diagnosis not present

## 2019-12-06 DIAGNOSIS — H5203 Hypermetropia, bilateral: Secondary | ICD-10-CM | POA: Diagnosis not present

## 2019-12-06 DIAGNOSIS — H10413 Chronic giant papillary conjunctivitis, bilateral: Secondary | ICD-10-CM | POA: Diagnosis not present

## 2019-12-16 ENCOUNTER — Other Ambulatory Visit (HOSPITAL_COMMUNITY): Payer: Self-pay | Admitting: Dermatology

## 2019-12-16 DIAGNOSIS — L738 Other specified follicular disorders: Secondary | ICD-10-CM | POA: Diagnosis not present

## 2019-12-16 DIAGNOSIS — L0889 Other specified local infections of the skin and subcutaneous tissue: Secondary | ICD-10-CM | POA: Diagnosis not present

## 2019-12-16 MED FILL — DOXYCYCLINE HYCLATE 100 MG: 100 | 14 days supply | Qty: 28 | Fill #0

## 2019-12-29 ENCOUNTER — Other Ambulatory Visit (HOSPITAL_COMMUNITY): Payer: Self-pay | Admitting: Internal Medicine

## 2019-12-29 DIAGNOSIS — K219 Gastro-esophageal reflux disease without esophagitis: Secondary | ICD-10-CM | POA: Diagnosis not present

## 2019-12-29 DIAGNOSIS — K6389 Other specified diseases of intestine: Secondary | ICD-10-CM | POA: Diagnosis not present

## 2019-12-29 DIAGNOSIS — Z1211 Encounter for screening for malignant neoplasm of colon: Secondary | ICD-10-CM | POA: Diagnosis not present

## 2019-12-29 DIAGNOSIS — K5090 Crohn's disease, unspecified, without complications: Secondary | ICD-10-CM | POA: Diagnosis not present

## 2019-12-29 DIAGNOSIS — K644 Residual hemorrhoidal skin tags: Secondary | ICD-10-CM | POA: Diagnosis not present

## 2019-12-29 DIAGNOSIS — Z86010 Personal history of colonic polyps: Secondary | ICD-10-CM | POA: Diagnosis not present

## 2019-12-29 DIAGNOSIS — K5730 Diverticulosis of large intestine without perforation or abscess without bleeding: Secondary | ICD-10-CM | POA: Diagnosis not present

## 2019-12-29 DIAGNOSIS — G4730 Sleep apnea, unspecified: Secondary | ICD-10-CM | POA: Diagnosis not present

## 2019-12-29 DIAGNOSIS — K648 Other hemorrhoids: Secondary | ICD-10-CM | POA: Diagnosis not present

## 2019-12-29 DIAGNOSIS — K5010 Crohn's disease of large intestine without complications: Secondary | ICD-10-CM | POA: Diagnosis not present

## 2019-12-29 DIAGNOSIS — M1990 Unspecified osteoarthritis, unspecified site: Secondary | ICD-10-CM | POA: Diagnosis not present

## 2019-12-29 MED FILL — CELECOXIB 100 MG CAP: 100 | 30 days supply | Qty: 60 | Fill #0

## 2020-01-10 ENCOUNTER — Other Ambulatory Visit: Payer: Self-pay | Admitting: Family Medicine

## 2020-01-10 MED FILL — VIT D2 1.25 MG (50,000 UNIT: 1.25 MG | 84 days supply | Qty: 12 | Fill #0

## 2020-01-10 MED FILL — LUMIGAN 0.01% EYE DROPS: 0.01 | 25 days supply | Qty: 3 | Fill #1

## 2020-01-18 MED FILL — HUMIRA CF 40 MG/0.4ML PSKT: 40 | 28 days supply | Qty: 2 | Fill #8

## 2020-01-20 ENCOUNTER — Ambulatory Visit: Payer: 59 | Admitting: Family Medicine

## 2020-02-03 NOTE — Progress Notes (Signed)
[place] Phone: [phone] Subjective:   [name], am serving as a scribe for Dr. Hulan Saas. This visit occurred during the SARS-CoV-2 public health emergency.  Safety protocols were in place, including screening questions prior to the visit, additional usage of staff PPE, and extensive cleaning of exam room while observing appropriate contact time as indicated for disinfecting solutions.   I'm seeing this patient by the request  of:  [name], Cresenciano Lick, MD  CC: Low back pain follow-up  [id number]  QUANIYAH BUGH is a 60 y.o. female coming in with complaint of back and neck pain. OMT 11/20/2019. Patient states that her right hip and left ankle have been bothering her more since last visit. Pain in both joints with walking. Streching hip for pain relief.  Seems to be on the right side at this moment.  Patient is able to workout on a regular basis do daily activities.  Using very minimal medications  Did not injure ankle but stood up one day and felt a lot of pain. No pop or snap and has been having pain since then surrounding the talocrual joint.  Patient states since then is doing relatively well.         Reviewed prior external information including notes and imaging from previsou exam, outside providers and external EMR if available.   As well as notes that were available from care everywhere and other healthcare systems.  Past medical history, social, surgical and family history all reviewed in electronic medical record.  No pertanent information unless stated regarding to the chief complaint.   Past Medical History:  Diagnosis Date   Anxiety    Crohn disease ([place])    Depression    GERD (gastroesophageal reflux disease)    History of methicillin resistant staphylococcus aureus (MRSA)    Sleep apnea    CPAP    Allergies  Allergen Reactions   Chlorhexidine Gluconate Itching    Hydrocodone Itching     Review of Systems:  No headache, visual changes, nausea, vomiting, diarrhea, constipation, dizziness, abdominal pain, skin rash, fevers, chills, night sweats, weight loss, swollen lymph nodes, body aches, joint swelling, chest pain, shortness of breath, mood changes. POSITIVE muscle aches  Objective  Blood pressure 102/70, height 5' 2"  (1.575 m), weight 142 lb (64.4 kg), last menstrual period 01/10/2014.   General: No apparent distress alert and oriented x3 mood and affect normal, dressed appropriately.  HEENT: Pupils equal, extraocular movements intact  Respiratory: Patient's speak in full sentences and does not appear short of breath  Cardiovascular: No lower extremity edema, non tender, no erythema  Neuro: Cranial nerves II through XII are intact, neurovascularly intact in all extremities with 2+ DTRs and 2+ pulses.  Gait normal with good balance and coordination.  MSK:  Non tender with full range of motion and good stability and symmetric strength and tone of shoulders, elbows, wrist, hip, knee and ankles bilaterally.  Back - Normal skin, Spine with normal alignment and no deformity.  No tenderness to vertebral process palpation.  Paraspinous muscles are not tender and without spasm.   Range of motion is full at neck and lumbar sacral regions  Osteopathic findings   T11 extended rotated and side bent left L2 flexed rotated and side bent right Sacrum right on right       Assessment and Plan:  Piriformis syndrome of right side Still mild tightness overall.  Patient though has done  relatively well.  Still responds very well to osteopathic manipulation.  Patient has the Zanaflex for breakthrough.  Discussed increasing activity slowly.  Follow-up with me again in 4 to 8 weeks    Nonallopathic problems  Decision today to treat with OMT was based on Physical Exam  After verbal consent patient was treated with HVLA, ME, FPR techniques in , thoracic, lumbar,  and sacral  areas  Patient tolerated the procedure well with improvement in symptoms  Patient given exercises, stretches and lifestyle modifications  See medications in patient instructions if given  Patient will follow up in 4-8 weeks      The above documentation has been reviewed and is accurate and complete Lyndal Pulley, DO       Note: This dictation was prepared with Dragon dictation along with smaller phrase technology. Any transcriptional errors that result from this process are unintentional.

## 2020-02-04 ENCOUNTER — Ambulatory Visit (INDEPENDENT_AMBULATORY_CARE_PROVIDER_SITE_OTHER): Payer: 59 | Admitting: Family Medicine

## 2020-02-04 ENCOUNTER — Other Ambulatory Visit: Payer: Self-pay

## 2020-02-04 ENCOUNTER — Encounter: Payer: Self-pay | Admitting: Family Medicine

## 2020-02-04 VITALS — BP 102/70 | Ht 62.0 in | Wt 142.0 lb

## 2020-02-04 DIAGNOSIS — M999 Biomechanical lesion, unspecified: Secondary | ICD-10-CM

## 2020-02-04 DIAGNOSIS — G5701 Lesion of sciatic nerve, right lower limb: Secondary | ICD-10-CM

## 2020-02-04 NOTE — Assessment & Plan Note (Signed)
Still mild tightness overall.  Patient though has done relatively well.  Still responds very well to osteopathic manipulation.  Patient has the Zanaflex for breakthrough.  Discussed increasing activity slowly.  Follow-up with me again in 4 to 8 weeks

## 2020-02-04 NOTE — Patient Instructions (Signed)
Good to see you  Overall not bad You know the drill  Have fun today  See me again in 6-8 weeks

## 2020-02-09 ENCOUNTER — Other Ambulatory Visit: Payer: Self-pay | Admitting: Pharmacist

## 2020-02-09 MED ORDER — HUMIRA (2 SYRINGE) 40 MG/0.4ML ~~LOC~~ PSKT: PREFILLED_SYRINGE | SUBCUTANEOUS | 6 refills | Status: DC

## 2020-02-14 MED FILL — busPIRone HCL 7.5 MG TABS: 7.5 | 90 days supply | Qty: 180 | Fill #1

## 2020-02-14 MED FILL — ESTRADIOL-NORETHINDRONE ACE: 1-0.5 | 84 days supply | Qty: 84 | Fill #1

## 2020-02-15 MED FILL — HUMIRA CF 40 MG/0.4ML PSKT: 40 | 28 days supply | Qty: 2 | Fill #0

## 2020-03-02 ENCOUNTER — Other Ambulatory Visit: Payer: Self-pay

## 2020-03-02 ENCOUNTER — Other Ambulatory Visit: Payer: 59 | Admitting: Internal Medicine

## 2020-03-02 DIAGNOSIS — J309 Allergic rhinitis, unspecified: Secondary | ICD-10-CM

## 2020-03-02 DIAGNOSIS — Z1321 Encounter for screening for nutritional disorder: Secondary | ICD-10-CM

## 2020-03-02 DIAGNOSIS — E7800 Pure hypercholesterolemia, unspecified: Secondary | ICD-10-CM | POA: Diagnosis not present

## 2020-03-02 DIAGNOSIS — K5010 Crohn's disease of large intestine without complications: Secondary | ICD-10-CM

## 2020-03-02 DIAGNOSIS — E785 Hyperlipidemia, unspecified: Secondary | ICD-10-CM | POA: Diagnosis not present

## 2020-03-02 DIAGNOSIS — F439 Reaction to severe stress, unspecified: Secondary | ICD-10-CM | POA: Diagnosis not present

## 2020-03-02 DIAGNOSIS — F411 Generalized anxiety disorder: Secondary | ICD-10-CM

## 2020-03-02 DIAGNOSIS — Z0000 Encounter for general adult medical examination without abnormal findings: Secondary | ICD-10-CM | POA: Diagnosis not present

## 2020-03-02 NOTE — Addendum Note (Signed)
Addended by: Mady Haagensen on: 03/02/2020 09:41 AM   Modules accepted: Orders

## 2020-03-03 LAB — CBC WITH DIFFERENTIAL/PLATELET
Absolute Monocytes: 459 cells/uL (ref 200–950)
Basophils Absolute: 112 cells/uL (ref 0–200)
Basophils Relative: 2.2 %
Eosinophils Absolute: 469 cells/uL (ref 15–500)
Eosinophils Relative: 9.2 %
HCT: 39.4 % (ref 35.0–45.0)
Hemoglobin: 13.1 g/dL (ref 11.7–15.5)
Lymphs Abs: 2310 cells/uL (ref 850–3900)
MCH: 31 pg (ref 27.0–33.0)
MCHC: 33.2 g/dL (ref 32.0–36.0)
MCV: 93.1 fL (ref 80.0–100.0)
MPV: 9.9 fL (ref 7.5–12.5)
Monocytes Relative: 9 %
Neutro Abs: 1749 cells/uL (ref 1500–7800)
Neutrophils Relative %: 34.3 %
Platelets: 239 10*3/uL (ref 140–400)
RBC: 4.23 10*6/uL (ref 3.80–5.10)
RDW: 12.2 % (ref 11.0–15.0)
Total Lymphocyte: 45.3 %
WBC: 5.1 10*3/uL (ref 3.8–10.8)

## 2020-03-03 LAB — COMPLETE METABOLIC PANEL WITH GFR
AG Ratio: 1.6 (calc) (ref 1.0–2.5)
ALT: 16 U/L (ref 6–29)
AST: 21 U/L (ref 10–35)
Albumin: 4.2 g/dL (ref 3.6–5.1)
Alkaline phosphatase (APISO): 31 U/L — ABNORMAL LOW (ref 37–153)
BUN: 11 mg/dL (ref 7–25)
CO2: 26 mmol/L (ref 20–32)
Calcium: 8.6 mg/dL (ref 8.6–10.4)
Chloride: 105 mmol/L (ref 98–110)
Creat: 0.67 mg/dL (ref 0.50–1.05)
GFR, Est African American: 112 mL/min/{1.73_m2} (ref >=60)
GFR, Est Non African American: 96 mL/min/{1.73_m2} (ref >=60)
Globulin: 2.6 g/dL (calc) (ref 1.9–3.7)
Glucose, Bld: 85 mg/dL (ref 65–99)
Potassium: 4.1 mmol/L (ref 3.5–5.3)
Sodium: 138 mmol/L (ref 135–146)
Total Bilirubin: 0.7 mg/dL (ref 0.2–1.2)
Total Protein: 6.8 g/dL (ref 6.1–8.1)

## 2020-03-03 LAB — LIPID PANEL
Cholesterol: 223 mg/dL — ABNORMAL HIGH (ref <200)
HDL: 92 mg/dL (ref >=50)
LDL Cholesterol (Calc): 117 mg/dL (calc) — ABNORMAL HIGH
Non-HDL Cholesterol (Calc): 131 mg/dL (calc) — ABNORMAL HIGH (ref <130)
Total CHOL/HDL Ratio: 2.4 (calc) (ref <5.0)
Triglycerides: 50 mg/dL (ref <150)

## 2020-03-03 LAB — TSH: TSH: 2.65 mIU/L (ref 0.40–4.50)

## 2020-03-03 LAB — VITAMIN D 25 HYDROXY (VIT D DEFICIENCY, FRACTURES): Vit D, 25-Hydroxy: 67 ng/mL (ref 30–100)

## 2020-03-09 ENCOUNTER — Other Ambulatory Visit: Payer: Self-pay

## 2020-03-09 ENCOUNTER — Ambulatory Visit (INDEPENDENT_AMBULATORY_CARE_PROVIDER_SITE_OTHER): Payer: 59 | Admitting: Internal Medicine

## 2020-03-09 ENCOUNTER — Encounter: Payer: Self-pay | Admitting: Internal Medicine

## 2020-03-09 VITALS — BP 110/70 | HR 63 | Ht 62.0 in | Wt 144.0 lb

## 2020-03-09 DIAGNOSIS — F439 Reaction to severe stress, unspecified: Secondary | ICD-10-CM | POA: Diagnosis not present

## 2020-03-09 DIAGNOSIS — Z8719 Personal history of other diseases of the digestive system: Secondary | ICD-10-CM | POA: Diagnosis not present

## 2020-03-09 DIAGNOSIS — E7800 Pure hypercholesterolemia, unspecified: Secondary | ICD-10-CM

## 2020-03-09 DIAGNOSIS — J309 Allergic rhinitis, unspecified: Secondary | ICD-10-CM | POA: Diagnosis not present

## 2020-03-09 DIAGNOSIS — Z8669 Personal history of other diseases of the nervous system and sense organs: Secondary | ICD-10-CM | POA: Diagnosis not present

## 2020-03-09 DIAGNOSIS — Z0000 Encounter for general adult medical examination without abnormal findings: Secondary | ICD-10-CM

## 2020-03-09 DIAGNOSIS — Z780 Asymptomatic menopausal state: Secondary | ICD-10-CM

## 2020-03-09 LAB — POCT URINALYSIS DIPSTICK
Appearance: NEGATIVE
Bilirubin, UA: NEGATIVE
Blood, UA: NEGATIVE
Glucose, UA: NEGATIVE
Ketones, UA: NEGATIVE
Leukocytes, UA: NEGATIVE
Nitrite, UA: NEGATIVE
Odor: NEGATIVE
Protein, UA: NEGATIVE
Spec Grav, UA: 1.01 (ref 1.010–1.025)
Urobilinogen, UA: 0.2 E.U./dL
pH, UA: 7.5 (ref 5.0–8.0)

## 2020-03-09 MED ORDER — CELECOXIB 100 MG PO CAPS: 100.0000 mg | ORAL_CAPSULE | Freq: Two times a day (BID) | ORAL | 5 refills | Status: DC

## 2020-03-09 MED ORDER — ROSUVASTATIN CALCIUM 5 MG PO TABS: ORAL_TABLET | ORAL | 3 refills | Status: DC

## 2020-03-09 MED FILL — ROSUVASTATIN CALCIUM 5 MG T: 5 | 28 days supply | Qty: 12 | Fill #0

## 2020-03-09 MED FILL — CELECOXIB 100 MG CAP: 100 | 30 days supply | Qty: 60 | Fill #0

## 2020-03-09 NOTE — Progress Notes (Signed)
Subjective:    Patient ID: [name], female    DOB: 1960/08/27, 60 y.o.   MRN: [id number]  HPI 60 year old Female seen for health maintenance exam and evaluation of medical issues.  Was diagnosed with hernia epigastric area by [name]. We discussed repair. [name] has recently retired. She will speak  Could be repaired in the Fall after tennis season.  He had mentioned laparoscopic repair with mesh when he saw her in 2016.  She is followed at [facility] for history of Crohn's disease.  Is treated with Humira.  History of allergic rhinitis and recurrent sinusitis.  History of situational stress and anxiety treated with Lexapro 10 mg daily and BuSpar.  In December was started on high-dose vitamin D 50,000 units every 7 days by Dr. [name], sports medicine physician.  She also takes Celebrex 100 mg twice daily.  Dr. [name] performed repair of right index collateral ligament in November 2017.  History of sleep apnea.  Not able to wear CPAP device if chronically nasally congested.  History of cough variant asthma.  Is seeing Dr. [name] at [facility].  History of MRSA.  Social history: Married.  2 sons.  Does not smoke.  Occasionally consumes beer.  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  Husband is a Film/video editor.  She has an 50 year old nephew living with her attending [employer].  Family history: Stroke and alcoholism in mother and sister.  Mother is deceased.  In 10-14-15 she had an abscess of right fourth finger requiring surgical drainage and culture grew Staph aureus sensitive to Bactrim.  History of epidermoid cyst removed from her back February 2019 at [facility].  Is seen Dr. [name] for eye exam in July 2020.  History of bilateral cataracts and open angle glaucoma with borderline findings bilaterally.  Had colonoscopy in 2019.    Review of Systems  Constitutional:  Negative.   Respiratory: Negative.   Cardiovascular: Negative.   Genitourinary: Negative.   Neurological: Negative.   Psychiatric/Behavioral: Negative.        Objective:   Physical Exam Blood pressure 110/70 pulse 63 pulse oximetry 96% weight 144 pounds BMI 26.34  Skin is warm and dry.  No cervical adenopathy.  TMs clear.  Neck is supple without JVD thyromegaly or carotid bruits.  Chest clear to auscultation.  Cardiac exam regular rate and rhythm normal S1 and S2.  Breast without masses.  Abdomen is soft nondistended without hepatosplenomegaly masses or tenderness.  There appears to be an epigastric defect about the size of a $0.50 piece mid epigastric area consistent with hernia previously evaluated with [name].  GYN exam deferred to Gynecology.  No lower extremity pitting edema.  Neuro: Intact without focal deficits.  Affect thought and judgment are normal.       Assessment & Plan:  Epigastric hernia-she has a busy upcoming tennis season and will consider surgical consultation after tennis season is over in the fall 2022.  I think this probably should be repaired.  Situational stress and anxiety treated with Lexapro and BuSpar  History of Crohn's disease treated with Humira and followed at [facility]  Hyperlipidemia-total cholesterol is 223 and was 240 a year ago.  LDL is 117 and was 125 a year ago.  We discussed this and have decided to start her on Crestor 5 mg 3 times a week and she will return in June for lipid panel liver functions  and office visit.  History of sleep apnea but has trouble tolerating CPAP device  History of allergic rhinitis  History of glaucoma  Plan: 9-monthfollow-up regarding mild hyperlipidemia.  Continue other medications as previously prescribed.

## 2020-03-09 NOTE — Patient Instructions (Addendum)
It was a pleasure to see you today.  Start Crestor 5 mg 3 times a week with supper and follow-up in 3 months.  Continue other medications.  Consider surgical consultation for epigastric hernia repair Fall 2022.

## 2020-03-10 MED FILL — LUMIGAN 0.01% EYE DROPS: 0.01 | 25 days supply | Qty: 3 | Fill #2

## 2020-03-10 MED FILL — ESCITALOPRAM 10 MG TABLET: 10 | 90 days supply | Qty: 90 | Fill #1

## 2020-03-15 MED FILL — HUMIRA CF 40 MG/0.4ML PSKT: 40 | 28 days supply | Qty: 2 | Fill #1

## 2020-03-21 ENCOUNTER — Ambulatory Visit (INDEPENDENT_AMBULATORY_CARE_PROVIDER_SITE_OTHER): Payer: 59 | Admitting: Family Medicine

## 2020-03-21 ENCOUNTER — Other Ambulatory Visit: Payer: Self-pay

## 2020-03-21 ENCOUNTER — Encounter: Payer: Self-pay | Admitting: Family Medicine

## 2020-03-21 VITALS — BP 100/62 | HR 70 | Ht 62.0 in | Wt 142.4 lb

## 2020-03-21 DIAGNOSIS — M999 Biomechanical lesion, unspecified: Secondary | ICD-10-CM | POA: Diagnosis not present

## 2020-03-21 DIAGNOSIS — K429 Umbilical hernia without obstruction or gangrene: Secondary | ICD-10-CM | POA: Diagnosis not present

## 2020-03-21 DIAGNOSIS — M5416 Radiculopathy, lumbar region: Secondary | ICD-10-CM | POA: Diagnosis not present

## 2020-03-21 NOTE — Patient Instructions (Signed)
Great to see you as alwys More of isometrics for core and avoid crunches and sit ups Stay active If back worsens call and we can consider epidural in the back Otherwise see me again in 6-8 weeks

## 2020-03-21 NOTE — Assessment & Plan Note (Signed)
Patient states that she has had this for quite some time.  Married to a physician.  They are discussing the possibility of having it fixed.  Discussed with patient of signs and symptoms and when to seek medical attention.  Patient is encouraged to call general surgery which she has seen previously.

## 2020-03-21 NOTE — Assessment & Plan Note (Signed)
Patient does have multiple level foraminal narrowing noted.  We discussed that if patient continues to have some of the buttocks pain I would consider the possibility of an epidural next step.  Patient is not taking significant number of medications and would like to avoid it as well.  Patient is staying active and encouraged her to continue to do so.  Discussed core strengthening and isometrics.  Follow-up with me again 6 to 8 weeks.

## 2020-03-21 NOTE — Progress Notes (Signed)
[name] Phone: [phone] Subjective:   [name], LAT, ATC, am serving as scribe for Dr. Hulan Saas.  I'm seeing this patient by the request  of:  [name], Stacey J, MD  CC: Low back pain follow-up  [id number]    02/04/20: R piriformis/low back: Still mild tightness overall.  Patient though has done relatively well.  Still responds very well to osteopathic manipulation.  Patient has the Zanaflex for breakthrough.  Discussed increasing activity slowly.  Follow-up with me again in 4 to 8 weeks  Update- 03/21/20 [name] is a 60 y.o. female coming in with complaint of back and neck pain and f/u of R hip and L ankle pain. OMT 02/04/2020. Patient states that both of her hips/ buttocks are bothering her today.  She denies any radiating pain into her B LEs patient states it only stays in the buttocks area.  He states it does seem to get worse with walking.  Denies fevers chills or any abnormal weight loss.  Patient is attempting to lose weight but is finding it difficult.  Medications patient has been prescribed: Vit D  Taking: Celebrex daily         Reviewed prior external information including notes and imaging from previsou exam, outside providers and external EMR if available.   As well as notes that were available from care everywhere and other healthcare systems.  Past medical history, social, surgical and family history all reviewed in electronic medical record.  No pertanent information unless stated regarding to the chief complaint.   Past Medical History:  Diagnosis Date  . Anxiety   . Crohn disease ([place])   . Depression   . GERD (gastroesophageal reflux disease)   . History of methicillin resistant staphylococcus aureus (MRSA)   . Sleep apnea    CPAP    Allergies  Allergen Reactions  . Chlorhexidine Gluconate Itching  . Hydrocodone Itching     Review of Systems:  No  headache, visual changes, nausea, vomiting, diarrhea, constipation, dizziness, abdominal pain, skin rash, fevers, chills, night sweats, weight loss, swollen lymph nodes,, joint swelling, chest pain, shortness of breath, mood changes. POSITIVE muscle aches, body aches  Objective  Last menstrual period 01/10/2014.   General: No apparent distress alert and oriented x3 mood and affect normal, dressed appropriately.  HEENT: Pupils equal, extraocular movements intact  Respiratory: Patient's speak in full sentences and does not appear short of breath  Cardiovascular: No lower extremity edema, non tender, no erythema  Gait normal with good balance and coordination.  MSK:  Non tender with full range of motion and good stability and symmetric strength and tone of shoulders, elbows, wrist, hip, knee and ankles bilaterally.  Back -back exam mild loss of lordosis.  Tenderness to palpation in the paraspinal musculature of the lumbar spine left greater than right.  Tightness of the FABER test bilaterally.  More pain in the buttocks region and over the piriformis bilaterally.  Back exam does have some pain though with flexion and extension greater than 15 degrees in both planes.  Osteopathic findings   T8 extended rotated and side bent left L2 flexed rotated and side bent right Sacrum right on right       Assessment and Plan:  Lumbar radiculopathy Patient does have multiple level foraminal narrowing noted.  We discussed that if patient continues to have some of the buttocks pain I would consider the possibility of an epidural  next step.  Patient is not taking significant number of medications and would like to avoid it as well.  Patient is staying active and encouraged her to continue to do so.  Discussed core strengthening and isometrics.  Follow-up with me again 6 to 8 weeks.  Umbilical hernia Patient states that she has had this for quite some time.  Married to a physician.  They are discussing the  possibility of having it fixed.  Discussed with patient of signs and symptoms and when to seek medical attention.  Patient is encouraged to call general surgery which she has seen previously.    Nonallopathic problems  Decision today to treat with OMT was based on Physical Exam  After verbal consent patient was treated with HVLA, ME, FPR techniques in  thoracic, lumbar, and sacral  areas  Patient tolerated the procedure well with improvement in symptoms  Patient given exercises, stretches and lifestyle modifications  See medications in patient instructions if given  Patient will follow up in 6-8 weeks      The above documentation has been reviewed and is accurate and complete Lyndal Pulley, DO       Note: This dictation was prepared with Dragon dictation along with smaller phrase technology. Any transcriptional errors that result from this process are unintentional.

## 2020-04-06 ENCOUNTER — Other Ambulatory Visit: Payer: Self-pay | Admitting: Family Medicine

## 2020-04-06 MED FILL — VIT D2 1.25 MG (50,000 UNIT: 1.25 MG | 84 days supply | Qty: 12 | Fill #0

## 2020-04-06 MED FILL — ROSUVASTATIN CALCIUM 5 MG T: 5 | 28 days supply | Qty: 12 | Fill #1

## 2020-04-10 MED FILL — HUMIRA CF 40 MG/0.4ML PSKT: 40 | 28 days supply | Qty: 2 | Fill #2

## 2020-04-12 DIAGNOSIS — F411 Generalized anxiety disorder: Secondary | ICD-10-CM | POA: Diagnosis not present

## 2020-04-13 ENCOUNTER — Other Ambulatory Visit (HOSPITAL_COMMUNITY): Payer: Self-pay

## 2020-04-26 DIAGNOSIS — F411 Generalized anxiety disorder: Secondary | ICD-10-CM | POA: Diagnosis not present

## 2020-04-29 ENCOUNTER — Telehealth: Payer: Self-pay | Admitting: Internal Medicine

## 2020-04-29 NOTE — Telephone Encounter (Signed)
Bone density study order was sent to [facility]. This will need to be faxed to [facility] next week when we re-open. Also, please fax her last CPE to [facility] and request appt for hernis repair with Dr. Ralene Ok. MJB,MD

## 2020-05-03 NOTE — Progress Notes (Signed)
[place] [address] [place] Phone: [phone] Subjective:   I [name] am serving as a Education administrator for Dr. Hulan Saas.  This visit occurred during the SARS-CoV-2 public health emergency.  Safety protocols were in place, including screening questions prior to the visit, additional usage of staff PPE, and extensive cleaning of exam room while observing appropriate contact time as indicated for disinfecting solutions.   I'm seeing this patient by the request  of:  [name], Cresenciano Lick, MD  CC: Low back pain follow-up  [id number]  [name] is a 60 y.o. female coming in with complaint of back and neck pain. OMT 03/21/2020. Patient states her neck is not tight at the moment however that comes and goes. Bilateral hip pain.  Patient feels like it is more tightness.  Still seems to be more posteriorly.  Medications patient has been prescribed: Vit D          Reviewed prior external information including notes and imaging from previsou exam, outside providers and external EMR if available.   As well as notes that were available from care everywhere and other healthcare systems.  Past medical history, social, surgical and family history all reviewed in electronic medical record.  No pertanent information unless stated regarding to the chief complaint.   Past Medical History:  Diagnosis Date  . Anxiety   . Crohn disease ([place])   . Depression   . GERD (gastroesophageal reflux disease)   . History of methicillin resistant staphylococcus aureus (MRSA)   . Sleep apnea    CPAP    Allergies  Allergen Reactions  . Chlorhexidine Gluconate Itching  . Hydrocodone Itching     Review of Systems:  No headache, visual changes, nausea, vomiting, diarrhea, constipation, dizziness, abdominal pain, skin rash, fevers, chills, night sweats, weight loss, swollen lymph nodes, body aches, joint swelling, chest pain, shortness of breath, mood  changes. POSITIVE muscle aches  Objective  Blood pressure 120/70, pulse 70, height 5' 2"  (1.575 m), weight 144 lb (65.3 kg), last menstrual period 01/10/2014, SpO2 97 %.   General: No apparent distress alert and oriented x3 mood and affect normal, dressed appropriately.  HEENT: Pupils equal, extraocular movements intact  Respiratory: Patient's speak in full sentences and does not appear short of breath  Cardiovascular: No lower extremity edema, non tender, no erythema  Gait normal with good balance and coordination.  MSK:  Non tender with full range of motion and good stability and symmetric strength and tone of shoulders, elbows, wrist, hip, knee and ankles bilaterally.  Back -back exam does have some mild loss of lordosis.  Tightness mostly around the sacroiliac joints bilaterally with medial little more right greater than left.  Tightness still of the left paraspinal musculature in the thoracolumbar junction noted.  Negative straight leg test.  Some mild increase in discomfort though with extension of the back.  Osteopathic findings  C6 flexed rotated and side bent right T5 extended rotated and side bent right inhaled rib L2 flexed rotated and side bent left Sacrum right on right       Assessment and Plan:   Lumbar radiculopathy Patient likely does not have any significant radicular symptoms but does have some mild increase in tightness noted.  Patient does have an Celebrex if necessary.  Will be going to discussed with patient potentially next time about the potential of changing patient's BuSpar and Lexapro to potentially Effexor or Cymbalta.   Nonallopathic problems  Decision today to treat with OMT was based on Physical Exam  After verbal consent patient was treated with HVLA, ME, FPR techniques in cervical, rib, thoracic, lumbar, and sacral  areas  Patient tolerated the procedure well with improvement in symptoms  Patient given exercises, stretches and lifestyle  modifications  See medications in patient instructions if given  Patient will follow up in 4-8 weeks      The above documentation has been reviewed and is accurate and complete Lyndal Pulley, DO       Note: This dictation was prepared with Dragon dictation along with smaller phrase technology. Any transcriptional errors that result from this process are unintentional.

## 2020-05-04 ENCOUNTER — Ambulatory Visit (INDEPENDENT_AMBULATORY_CARE_PROVIDER_SITE_OTHER): Payer: 59 | Admitting: Family Medicine

## 2020-05-04 ENCOUNTER — Other Ambulatory Visit: Payer: Self-pay

## 2020-05-04 ENCOUNTER — Encounter: Payer: Self-pay | Admitting: Family Medicine

## 2020-05-04 VITALS — BP 120/70 | HR 70 | Ht 62.0 in | Wt 144.0 lb

## 2020-05-04 DIAGNOSIS — M9904 Segmental and somatic dysfunction of sacral region: Secondary | ICD-10-CM

## 2020-05-04 DIAGNOSIS — M9902 Segmental and somatic dysfunction of thoracic region: Secondary | ICD-10-CM | POA: Diagnosis not present

## 2020-05-04 DIAGNOSIS — M9908 Segmental and somatic dysfunction of rib cage: Secondary | ICD-10-CM | POA: Diagnosis not present

## 2020-05-04 DIAGNOSIS — M5416 Radiculopathy, lumbar region: Secondary | ICD-10-CM

## 2020-05-04 DIAGNOSIS — M9901 Segmental and somatic dysfunction of cervical region: Secondary | ICD-10-CM | POA: Diagnosis not present

## 2020-05-04 DIAGNOSIS — M9903 Segmental and somatic dysfunction of lumbar region: Secondary | ICD-10-CM | POA: Diagnosis not present

## 2020-05-04 NOTE — Patient Instructions (Addendum)
Good to see you A little tighter Keep doing the stretches Have a great trip wherever you go See me again in 6-8 weeks

## 2020-05-04 NOTE — Assessment & Plan Note (Signed)
Patient likely does not have any significant radicular symptoms but does have some mild increase in tightness noted.  Patient does have an Celebrex if necessary.  Will be going to discussed with patient potentially next time about the potential of changing patient's BuSpar and Lexapro to potentially Effexor or Cymbalta.

## 2020-05-05 ENCOUNTER — Telehealth: Payer: Self-pay | Admitting: Internal Medicine

## 2020-05-05 ENCOUNTER — Other Ambulatory Visit (HOSPITAL_COMMUNITY): Payer: Self-pay

## 2020-05-05 ENCOUNTER — Encounter: Payer: Self-pay | Admitting: Family Medicine

## 2020-05-05 MED FILL — Estradiol & Norethindrone Acetate Tab 1-0.5 MG: ORAL | 84 days supply | Qty: 84 | Fill #0 | Status: AC

## 2020-05-05 MED FILL — Adalimumab Prefilled Syringe Kit 40 MG/0.4ML: SUBCUTANEOUS | 28 days supply | Qty: 2 | Fill #0 | Status: AC

## 2020-05-05 MED FILL — Celecoxib Cap 100 MG: ORAL | 30 days supply | Qty: 60 | Fill #0 | Status: AC

## 2020-05-05 MED FILL — Rosuvastatin Calcium Tab 5 MG: ORAL | 28 days supply | Qty: 12 | Fill #0 | Status: AC

## 2020-05-05 NOTE — Telephone Encounter (Signed)
Done

## 2020-05-05 NOTE — Telephone Encounter (Signed)
Tuesday at 4 pm

## 2020-05-05 NOTE — Telephone Encounter (Signed)
Pt calling wanting to make appt to discuss medication change, when would you like to see, She requested today if possible

## 2020-05-09 ENCOUNTER — Other Ambulatory Visit: Payer: Self-pay

## 2020-05-09 ENCOUNTER — Encounter: Payer: Self-pay | Admitting: Internal Medicine

## 2020-05-09 ENCOUNTER — Ambulatory Visit: Payer: 59 | Admitting: Internal Medicine

## 2020-05-09 ENCOUNTER — Other Ambulatory Visit (HOSPITAL_COMMUNITY): Payer: Self-pay

## 2020-05-09 VITALS — BP 100/60 | HR 76 | Ht 62.0 in | Wt 144.0 lb

## 2020-05-09 DIAGNOSIS — Z23 Encounter for immunization: Secondary | ICD-10-CM | POA: Diagnosis not present

## 2020-05-09 DIAGNOSIS — F439 Reaction to severe stress, unspecified: Secondary | ICD-10-CM

## 2020-05-09 DIAGNOSIS — G8929 Other chronic pain: Secondary | ICD-10-CM

## 2020-05-09 DIAGNOSIS — Z8719 Personal history of other diseases of the digestive system: Secondary | ICD-10-CM

## 2020-05-09 DIAGNOSIS — M7918 Myalgia, other site: Secondary | ICD-10-CM | POA: Diagnosis not present

## 2020-05-09 DIAGNOSIS — Z8669 Personal history of other diseases of the nervous system and sense organs: Secondary | ICD-10-CM | POA: Diagnosis not present

## 2020-05-09 DIAGNOSIS — Z0389 Encounter for observation for other suspected diseases and conditions ruled out: Secondary | ICD-10-CM | POA: Diagnosis not present

## 2020-05-09 MED ORDER — DULOXETINE HCL 30 MG PO CPEP
30.0000 mg | ORAL_CAPSULE | Freq: Every day | ORAL | 2 refills | Status: DC
  Filled 2020-05-09: qty 30, 30d supply, fill #0
  Filled 2020-06-09: qty 30, 30d supply, fill #1

## 2020-05-09 NOTE — Patient Instructions (Addendum)
Change Lexapro to Cymbalta 30 mg daily.  Which should help with musculoskeletal pain.  Continue BuSpar 7.5 mg twice daily.  Follow-up June 3 at which time we will also review lipid management..  Continue counseling.  Call if you have questions or concerns in the interim.

## 2020-05-09 NOTE — Progress Notes (Signed)
   Subjective:    Patient ID: [name], female    DOB: [birth year], 60 y.o.   MRN: [id number]  HPI 60 year old Female hear to discuss medications for anxiety and stress. Continued situational stress with nephew who lives with patient, her son and her husband. He goes to [employer] and works part time at [employer]. Patient see counselor at [facility].  Patient has chronic neck and back pain. Sees Dr. Charlann Boxer for this. Hx of Crohn's disease. Hx MRSA. Has CPap for sleep apnea.  Dr. Tamala Julian had suggested a change in her SSRI.  We suggested perhaps Effexor or Cymbalta.  She is agreeable to changing Lexapro to another SSRI.  She finds that she is frustrated with her nephew if he does not help around the house and do some simple chores such as being the daughter's at the table.  He seldom helps with yard work.  This is frustrating for patient.  His mother came to visit from [place] for Easter.  She is not very nurturing and does not help support him.    Review of Systems see above     Objective:   Physical Exam  Affect, thought, and judgment appear to be normal.  She does have history of musculoskeletal pain.  She plays quite a bit of tennis.  History of hernia and epigastric area.  She has seen surgeon recently who specializes in hernia repair.  History of Crohn's disease followed at [facility].  Spent some 40 minutes speaking with her about her medical issues and situational stress.  We have decided we will change Lexapro to Cymbalta and she will continue BuSpar.  Follow-up June 3 at which time we will also review her lipid management.  Continue counseling.      Assessment & Plan:  Situational stress  Chronic musculoskeletal pain-particularly back and neck as well as hip.  Does take vitamin D per Dr. Tamala Julian.  History of Crohn's disease  Plan: Switch Lexapro to Cymbalta 30 mg daily.  Continue BuSpar.

## 2020-05-10 ENCOUNTER — Other Ambulatory Visit: Payer: Self-pay

## 2020-05-10 ENCOUNTER — Encounter: Payer: Self-pay | Admitting: Internal Medicine

## 2020-05-11 ENCOUNTER — Other Ambulatory Visit (HOSPITAL_COMMUNITY): Payer: Self-pay

## 2020-05-15 ENCOUNTER — Encounter: Payer: Self-pay | Admitting: Internal Medicine

## 2020-05-23 DIAGNOSIS — Z79899 Other long term (current) drug therapy: Secondary | ICD-10-CM | POA: Diagnosis not present

## 2020-05-23 DIAGNOSIS — K5010 Crohn's disease of large intestine without complications: Secondary | ICD-10-CM | POA: Diagnosis not present

## 2020-05-24 MED FILL — Buspirone HCl Tab 7.5 MG: ORAL | 90 days supply | Qty: 180 | Fill #0 | Status: AC

## 2020-05-25 ENCOUNTER — Other Ambulatory Visit (HOSPITAL_COMMUNITY): Payer: Self-pay

## 2020-05-26 ENCOUNTER — Other Ambulatory Visit (HOSPITAL_COMMUNITY): Payer: Self-pay

## 2020-05-26 DIAGNOSIS — K439 Ventral hernia without obstruction or gangrene: Secondary | ICD-10-CM | POA: Diagnosis not present

## 2020-06-09 ENCOUNTER — Other Ambulatory Visit (HOSPITAL_COMMUNITY): Payer: Self-pay

## 2020-06-13 ENCOUNTER — Other Ambulatory Visit (HOSPITAL_COMMUNITY): Payer: Self-pay

## 2020-06-13 ENCOUNTER — Telehealth: Payer: Self-pay | Admitting: Internal Medicine

## 2020-06-13 MED FILL — Adalimumab Prefilled Syringe Kit 40 MG/0.4ML: SUBCUTANEOUS | 28 days supply | Qty: 2 | Fill #1 | Status: CN

## 2020-06-13 NOTE — Telephone Encounter (Signed)
LVM to keep fasting lab appointment

## 2020-06-13 NOTE — Telephone Encounter (Signed)
[name] [phone]-  Debbie wants to know if she need lab and visit this week since she is not taking the new medication.

## 2020-06-13 NOTE — Telephone Encounter (Signed)
Yes I want to follow through with the appointment and fasting lipid panel.

## 2020-06-14 NOTE — Progress Notes (Signed)
[place] Phone: [phone] Subjective:   [name], am serving as a scribe for Dr. Hulan Saas. This visit occurred during the SARS-CoV-2 public health emergency.  Safety protocols were in place, including screening questions prior to the visit, additional usage of staff PPE, and extensive cleaning of exam room while observing appropriate contact time as indicated for disinfecting solutions.   I'm seeing this patient by the request  of:  [name], Cresenciano Lick, MD  CC: Back pain follow-up  [id number]  [name] is a 60 y.o. female coming in with complaint of back and neck pain. OMT 05/04/2020. Patient states that she has been doing ok since last visit. L hip pain has increased since last visit. Had to lay seat down in car during a recent trip.   R sharp ankle pain over medial aspect. Sharp catching pain is intermittent.   Medications patient has been prescribed: None  Patient since last visit did change her Lexapro to Cymbalta 30 mg. Seen her gastroenterologist through telemedicine and was to continue the Humira       Reviewed prior external information including notes and imaging from previsou exam, outside providers and external EMR if available.   As well as notes that were available from care everywhere and other healthcare systems.  Past medical history, social, surgical and family history all reviewed in electronic medical record.  No pertanent information unless stated regarding to the chief complaint.   Past Medical History:  Diagnosis Date  . Anxiety   . Crohn disease ([place])   . Depression   . GERD (gastroesophageal reflux disease)   . History of methicillin resistant staphylococcus aureus (MRSA)   . Sleep apnea    CPAP    Allergies  Allergen Reactions  . Chlorhexidine Gluconate Itching  . Hydrocodone Itching     Review of Systems:  No headache, visual changes, nausea,  vomiting, diarrhea, constipation, dizziness, abdominal pain, skin rash, fevers, chills, night sweats, weight loss, swollen lymph nodes, body aches, joint swelling, chest pain, shortness of breath, mood changes. POSITIVE muscle aches  Objective  Blood pressure 102/78, pulse 95, height 5' 2"  (1.575 m), weight 143 lb (64.9 kg), last menstrual period 01/10/2014, SpO2 97 %.   General: No apparent distress alert and oriented x3 mood and affect normal, dressed appropriately.  HEENT: Pupils equal, extraocular movements intact  Respiratory: Patient's speak in full sentences and does not appear short of breath  Cardiovascular: No lower extremity edema, non tender, no erythema  Right ankle exam shows the patient does have some tenderness over the posterior tibialis tendon.  Good range of motion otherwise.  No pain over the medial malleolus Low back exam does have some loss of lordosis.  We will continue tenderness to palpation in the paraspinal musculature.  Continued tightness on the right side but new tightness noted on the left side..  Seems to be more of the piriformis area as well.  Osteopathic findings  C6 flexed rotated and side bent right  T6 extended rotated and side bent right inhaled rib L2 flexed rotated and side bent right Sacrum left on left     Assessment and Plan:  Tibialis posterior tendinitis, right Mild overall, discussed heel lift  discusse dgood shoes.  RTC in 6 weeks.   Piriformis syndrome of left side Patient is having pain and seems to be more consistent with a left-sided piriformis today.  This is more than usual.  Given different home exercises.  Patient has had this somewhat trouble previously on the contralateral side.  Discussed meeting was held with a tennis ball.  Follow-up with me again in 6 weeks    Nonallopathic problems  Decision today to treat with OMT was based on Physical Exam  After verbal consent patient was treated with HVLA, ME, FPR techniques in  cervical, rib, thoracic, lumbar, and sacral  areas  Patient tolerated the procedure well with improvement in symptoms  Patient given exercises, stretches and lifestyle modifications  See medications in patient instructions if given  Patient will follow up in 4-8 weeks      The above documentation has been reviewed and is accurate and complete Lyndal Pulley, DO       Note: This dictation was prepared with Dragon dictation along with smaller phrase technology. Any transcriptional errors that result from this process are unintentional.

## 2020-06-15 ENCOUNTER — Encounter: Payer: Self-pay | Admitting: Family Medicine

## 2020-06-15 ENCOUNTER — Other Ambulatory Visit: Payer: 59 | Admitting: Internal Medicine

## 2020-06-15 ENCOUNTER — Ambulatory Visit (INDEPENDENT_AMBULATORY_CARE_PROVIDER_SITE_OTHER): Payer: 59 | Admitting: Family Medicine

## 2020-06-15 ENCOUNTER — Other Ambulatory Visit: Payer: Self-pay

## 2020-06-15 VITALS — BP 102/78 | HR 95 | Ht 62.0 in | Wt 143.0 lb

## 2020-06-15 DIAGNOSIS — M76821 Posterior tibial tendinitis, right leg: Secondary | ICD-10-CM | POA: Insufficient documentation

## 2020-06-15 DIAGNOSIS — M9902 Segmental and somatic dysfunction of thoracic region: Secondary | ICD-10-CM

## 2020-06-15 DIAGNOSIS — E7800 Pure hypercholesterolemia, unspecified: Secondary | ICD-10-CM | POA: Diagnosis not present

## 2020-06-15 DIAGNOSIS — M9908 Segmental and somatic dysfunction of rib cage: Secondary | ICD-10-CM

## 2020-06-15 DIAGNOSIS — G5702 Lesion of sciatic nerve, left lower limb: Secondary | ICD-10-CM

## 2020-06-15 DIAGNOSIS — M9903 Segmental and somatic dysfunction of lumbar region: Secondary | ICD-10-CM | POA: Diagnosis not present

## 2020-06-15 DIAGNOSIS — M9904 Segmental and somatic dysfunction of sacral region: Secondary | ICD-10-CM | POA: Diagnosis not present

## 2020-06-15 DIAGNOSIS — M9901 Segmental and somatic dysfunction of cervical region: Secondary | ICD-10-CM | POA: Diagnosis not present

## 2020-06-15 NOTE — Patient Instructions (Signed)
Heel lift Posterior tib exercises Tennis ball in back R pocket See me again in before surgery

## 2020-06-15 NOTE — Assessment & Plan Note (Signed)
Mild overall, discussed heel lift  discusse dgood shoes.  RTC in 6 weeks.

## 2020-06-15 NOTE — Assessment & Plan Note (Signed)
Patient is having pain and seems to be more consistent with a left-sided piriformis today.  This is more than usual.  Given different home exercises.  Patient has had this somewhat trouble previously on the contralateral side.  Discussed meeting was held with a tennis ball.  Follow-up with me again in 6 weeks

## 2020-06-16 ENCOUNTER — Ambulatory Visit: Payer: 59 | Admitting: Internal Medicine

## 2020-06-16 LAB — HEPATIC FUNCTION PANEL
AG Ratio: 1.6 (calc) (ref 1.0–2.5)
ALT: 16 U/L (ref 6–29)
AST: 21 U/L (ref 10–35)
Albumin: 4.6 g/dL (ref 3.6–5.1)
Alkaline phosphatase (APISO): 36 U/L — ABNORMAL LOW (ref 37–153)
Bilirubin, Direct: 0.1 mg/dL (ref 0.0–0.2)
Globulin: 2.9 g/dL (calc) (ref 1.9–3.7)
Indirect Bilirubin: 0.6 mg/dL (calc) (ref 0.2–1.2)
Total Bilirubin: 0.7 mg/dL (ref 0.2–1.2)
Total Protein: 7.5 g/dL (ref 6.1–8.1)

## 2020-06-16 LAB — LIPID PANEL
Cholesterol: 224 mg/dL — ABNORMAL HIGH (ref <200)
HDL: 85 mg/dL (ref >=50)
LDL Cholesterol (Calc): 126 mg/dL (calc) — ABNORMAL HIGH
Non-HDL Cholesterol (Calc): 139 mg/dL (calc) — ABNORMAL HIGH (ref <130)
Total CHOL/HDL Ratio: 2.6 (calc) (ref <5.0)
Triglycerides: 43 mg/dL (ref <150)

## 2020-06-22 ENCOUNTER — Other Ambulatory Visit (HOSPITAL_COMMUNITY): Payer: Self-pay

## 2020-06-22 MED FILL — Adalimumab Prefilled Syringe Kit 40 MG/0.4ML: SUBCUTANEOUS | 28 days supply | Qty: 2 | Fill #1 | Status: AC

## 2020-06-23 ENCOUNTER — Other Ambulatory Visit: Payer: Self-pay | Admitting: Family Medicine

## 2020-06-26 ENCOUNTER — Ambulatory Visit: Payer: 59 | Admitting: Internal Medicine

## 2020-06-26 ENCOUNTER — Other Ambulatory Visit (HOSPITAL_COMMUNITY): Payer: Self-pay

## 2020-06-26 ENCOUNTER — Encounter: Payer: Self-pay | Admitting: Internal Medicine

## 2020-06-26 ENCOUNTER — Other Ambulatory Visit: Payer: Self-pay

## 2020-06-26 VITALS — BP 120/70 | HR 75 | Ht 62.0 in | Wt 144.0 lb

## 2020-06-26 DIAGNOSIS — R0981 Nasal congestion: Secondary | ICD-10-CM

## 2020-06-26 MED ORDER — VITAMIN D (ERGOCALCIFEROL) 1.25 MG (50000 UNIT) PO CAPS
50000.0000 [IU] | ORAL_CAPSULE | ORAL | 0 refills | Status: DC
  Filled 2020-06-26: qty 12, 84d supply, fill #0

## 2020-06-26 MED ORDER — METHYLPREDNISOLONE ACETATE 80 MG/ML IJ SUSP
80.0000 mg | Freq: Once | INTRAMUSCULAR | Status: AC
  Administered 2020-06-26: 80 mg via INTRAMUSCULAR

## 2020-06-26 MED ORDER — DULOXETINE HCL 60 MG PO CPEP
60.0000 mg | ORAL_CAPSULE | Freq: Every day | ORAL | 3 refills | Status: DC
  Filled 2020-06-26: qty 30, 30d supply, fill #0
  Filled 2020-08-04: qty 30, 30d supply, fill #1
  Filled 2020-09-06: qty 30, 30d supply, fill #2
  Filled 2020-10-10: qty 30, 30d supply, fill #3

## 2020-06-26 NOTE — Progress Notes (Signed)
   Subjective:    Patient ID: [name], female    DOB: 03-22-1960, 60 y.o.   MRN: [id number]  HPI 60 year old Female here today for follow-up on hyperlipidemia and situational stress.  At last visit in April was placed on Cymbalta.  Lexapro was discontinued.  She has a history of Crohn's disease and is followed at [facility].  She has history of ventral hernia and is scheduled for surgery for repair of this hernia in July.  She thinks maybe Cymbalta is helping a bit.  We can increase Cymbalta from 30 to 60 mg daily.  Has chronic musculoskeletal pain and has been seen by Dr. Gardenia Phlegm.  She does have BuSpar for anxiety.  Recently she developed a cough.  She was in a home where he Was living.  She did not actually handle the cat but was exposed to the environment where the cat lives.  Has developed a cough and congestion.  Had negative home COVID test.  Review of Systems unable to tolerate statin due to myalgias.     Objective:   Physical Exam I think she seems a bit calmer than she did at previous visit April 26.  Zetia has been suggested to her by her husband who is a Film/video editor.  This seems like a reasonable choice.         Assessment & Plan:  Intolerant of Crestor 5 mg 3 times a week so we will change to Zetia 10 mg daily.  ?  Allergic upper respiratory infection due to cat exposure.  Given Depo-Medrol 80 mg IM in office today.  We discussed taking antibiotics but she is afraid antibiotics will aggravate her Crohn's disease.  Suggest we repeat fasting lipid panel in about 3 months  Anxiety state-May continue with BuSpar twice daily  History of Crohn's disease treated with monoclonal antibody inhibitor  Depression increase Cymbalta to 60 mg daily.  This also should help with musculoskeletal pain  Ventral hernia scheduled for repair in July  Suggest repeat lipid panel without liver functions since she is now on Zetia and in 3 months with office visit.  Continue  Cymbalta which has been increased to 60 mg daily.  Depo-Medrol 80 mg IM given in office today.  A respiratory virus panel was obtained.  Symptoms may be more representative of allergic rhinitis due to cat exposure.

## 2020-06-26 NOTE — Patient Instructions (Signed)
Given 80 mg IM Depo-Medrol.  Cymbalta has been increased to 60 mg daily.  Change Crestor to Zetia and follow-up in 3 months with lipid panel without liver functions.  Respiratory virus panel obtained and is pending.

## 2020-06-28 LAB — RESPIRATORY VIRUS PANEL
Adenovirus B: NOT DETECTED
HUMAN PARAINFLU VIRUS 1: NOT DETECTED
HUMAN PARAINFLU VIRUS 2: NOT DETECTED
HUMAN PARAINFLU VIRUS 3: DETECTED — AB
INFLUENZA A SUBTYPE H1: NOT DETECTED
INFLUENZA A SUBTYPE H3: NOT DETECTED
Influenza A: NOT DETECTED
Influenza B: NOT DETECTED
Metapneumovirus: NOT DETECTED
Respiratory Syncytial Virus A: NOT DETECTED
Respiratory Syncytial Virus B: NOT DETECTED
Rhinovirus: NOT DETECTED

## 2020-06-29 ENCOUNTER — Other Ambulatory Visit (HOSPITAL_COMMUNITY): Payer: Self-pay

## 2020-06-30 ENCOUNTER — Other Ambulatory Visit (HOSPITAL_COMMUNITY): Payer: Self-pay

## 2020-07-12 ENCOUNTER — Other Ambulatory Visit (HOSPITAL_COMMUNITY): Payer: Self-pay

## 2020-07-13 ENCOUNTER — Other Ambulatory Visit (HOSPITAL_COMMUNITY): Payer: Self-pay

## 2020-07-13 MED FILL — Adalimumab Prefilled Syringe Kit 40 MG/0.4ML: SUBCUTANEOUS | 28 days supply | Qty: 2 | Fill #2 | Status: CN

## 2020-07-18 ENCOUNTER — Encounter: Payer: Self-pay | Admitting: Family Medicine

## 2020-07-18 ENCOUNTER — Other Ambulatory Visit: Payer: Self-pay

## 2020-07-18 ENCOUNTER — Ambulatory Visit (INDEPENDENT_AMBULATORY_CARE_PROVIDER_SITE_OTHER): Payer: 59 | Admitting: Family Medicine

## 2020-07-18 ENCOUNTER — Ambulatory Visit: Payer: Self-pay | Admitting: General Surgery

## 2020-07-18 VITALS — BP 124/80 | HR 85 | Ht 62.0 in | Wt 144.0 lb

## 2020-07-18 DIAGNOSIS — M9902 Segmental and somatic dysfunction of thoracic region: Secondary | ICD-10-CM | POA: Diagnosis not present

## 2020-07-18 DIAGNOSIS — M5416 Radiculopathy, lumbar region: Secondary | ICD-10-CM | POA: Diagnosis not present

## 2020-07-18 DIAGNOSIS — M9903 Segmental and somatic dysfunction of lumbar region: Secondary | ICD-10-CM | POA: Diagnosis not present

## 2020-07-18 DIAGNOSIS — M9908 Segmental and somatic dysfunction of rib cage: Secondary | ICD-10-CM

## 2020-07-18 DIAGNOSIS — M9904 Segmental and somatic dysfunction of sacral region: Secondary | ICD-10-CM | POA: Diagnosis not present

## 2020-07-18 DIAGNOSIS — M9901 Segmental and somatic dysfunction of cervical region: Secondary | ICD-10-CM

## 2020-07-18 NOTE — Progress Notes (Signed)
[facility] [address] [place] Phone: [phone] Subjective:   I [name] am serving as a Education administrator for Dr. Hulan Saas.  This visit occurred during the SARS-CoV-2 public health emergency.  Safety protocols were in place, including screening questions prior to the visit, additional usage of staff PPE, and extensive cleaning of exam room while observing appropriate contact time as indicated for disinfecting solutions.   I'm seeing this patient by the request  of:  [name], Cresenciano Lick, MD  CC: Low back and neck pain follow-up  KTG:[id number]  [name] is a 60 y.o. female coming in with complaint of back and neck pain. OMT 06/15/2020. Patient states her neck is doing well. Hips and back still painful.  Patient will be undergoing hernia surgery in the near future.   Medications patient has been prescribed: Vit D  Taking: Yes        Past Medical History:  Diagnosis Date   Anxiety    Crohn disease ([place])    Depression    GERD (gastroesophageal reflux disease)    History of methicillin resistant staphylococcus aureus (MRSA)    Sleep apnea    CPAP    Allergies  Allergen Reactions   Chlorhexidine Gluconate Itching   Hydrocodone Itching     Review of Systems:  No headache, visual changes, nausea, vomiting, diarrhea, constipation, dizziness, abdominal pain, skin rash, fevers, chills, night sweats, weight loss, swollen lymph nodes,  joint swelling, chest pain, shortness of breath, mood changes. POSITIVE muscle aches, body aches  Objective  Blood pressure 124/80, pulse 85, height 5' 2"  (1.575 m), weight 144 lb (65.3 kg), last menstrual period 01/10/2014, SpO2 99 %.   General: No apparent distress alert and oriented x3 mood and affect normal, dressed appropriately.  HEENT: Pupils equal, extraocular movements intact  Respiratory: Patient's speak in full sentences and does not appear short of breath  Cardiovascular: No lower  extremity edema, non tender, no erythema  Patient does have some tenderness to palpation in the paraspinal musculature also in the sacroiliac joint bilaterally.  Patient does have tightness with FABER test bilaterally.  Osteopathic findings  C5 flexed rotated and side bent right T3 extended rotated and side bent right inhaled rib T9 extended rotated and side bent left L2 flexed rotated and side bent right Sacrum right on right       Assessment and Plan:  Lumbar radiculopathy Patient has been responding fairly well to osteopathic manipulation.  Patient did respond again today.  Hopefully will continue.  We discussed different medications at this time.  Patient is trying to decrease how much she is taking but is taking the vitamin D weekly.  Patient is going to be undergoing a hernia repair in the near future.  Follow-up with me again 6 weeks after the surgery to discuss potential to return to manipulation.   Nonallopathic problems  Decision today to treat with OMT was based on Physical Exam  After verbal consent patient was treated with HVLA, ME, FPR techniques in cervical, rib, thoracic, lumbar, and sacral  areas  Patient tolerated the procedure well with improvement in symptoms  Patient given exercises, stretches and lifestyle modifications  See medications in patient instructions if given  Patient will follow up in 4-8 weeks     The above documentation has been reviewed and is accurate and complete Lyndal Pulley, DO        Note: This dictation was prepared with Dragon dictation along  with smaller phrase technology. Any transcriptional errors that result from this process are unintentional.

## 2020-07-18 NOTE — Assessment & Plan Note (Signed)
Patient has been responding fairly well to osteopathic manipulation.  Patient did respond again today.  Hopefully will continue.  We discussed different medications at this time.  Patient is trying to decrease how much she is taking but is taking the vitamin D weekly.  Patient is going to be undergoing a hernia repair in the near future.  Follow-up with me again 6 weeks after the surgery to discuss potential to return to manipulation.

## 2020-07-18 NOTE — Patient Instructions (Addendum)
Good to see you You will do well with surgery See me again in 8-9 weeks

## 2020-07-19 NOTE — Progress Notes (Signed)
Surgical Instructions    Your procedure is scheduled on Wednesday, July 13th, 2022.  Report to [facility] Main Entrance "A" at 06:30 A.M., then check in with the Admitting office.  Call this number if you have problems the morning of surgery:  [phone]   If you have any questions prior to your surgery date call [phone]: Open Monday-Friday 8am-4pm    Remember:  Do not eat after midnight the night before your surgery  You may drink clear liquids until 05:30 the morning of your surgery.   Clear liquids allowed are: Water, Non-Citrus Juices (without pulp), Carbonated Beverages, Clear Tea, Black Coffee Only, and Gatorade  Patient Instructions  The night before surgery:  No food after midnight. ONLY clear liquids after midnight  The day of surgery (if you do NOT have diabetes):  Drink ONE (1) Pre-Surgery Clear Ensure by 05:30 the morning of surgery. Drink in one sitting. Do not sip.  This drink was given to you during your hospital  pre-op appointment visit.  Nothing else to drink after completing the  Pre-Surgery Clear Ensure.         If you have questions, please contact your surgeon's office.     Take these medicines the morning of surgery with A SIP OF WATER:  busPIRone (BUSPAR) cetirizine (ZYRTEC) DULoxetine (CYMBALTA) famotidine (PEPCID) estradiol-norethindrone (ACTIVELLA)   If needed:  acetaminophen (TYLENOL)  As of today, STOP taking any Aspirin (unless otherwise instructed by your surgeon) Aleve, Naproxen, Ibuprofen, Motrin, Advil, Goody's, BC's, all herbal medications, fish oil, and all vitamins.                     Do NOT Smoke (Tobacco/Vaping) or drink Alcohol 24 hours prior to your procedure.  If you use a CPAP at night, you may bring all equipment for your overnight stay.   Contacts, glasses, piercing's, hearing aid's, dentures or partials may not be worn into surgery, please bring cases for these belongings.    For patients admitted to the  hospital, discharge time will be determined by your treatment team.   Patients discharged the day of surgery will not be allowed to drive home, and someone needs to stay with them for 24 hours.  ONLY 1 SUPPORT PERSON MAY BE PRESENT WHILE YOU ARE IN SURGERY. IF YOU ARE TO BE ADMITTED ONCE YOU ARE IN YOUR ROOM YOU WILL BE ALLOWED TWO (2) VISITORS.  Minor children may have two parents present. Special consideration for safety and communication needs will be reviewed on a case by case basis.   Special instructions:   [facility]- Preparing For Surgery  Before surgery, you can play an important role. Because skin is not sterile, your skin needs to be as free of germs as possible. You can reduce the number of germs on your skin by washing with CHG (chlorahexidine gluconate) Soap before surgery.  CHG is an antiseptic cleaner which kills germs and bonds with the skin to continue killing germs even after washing.    Oral Hygiene is also important to reduce your risk of infection.  Remember - BRUSH YOUR TEETH THE MORNING OF SURGERY WITH YOUR REGULAR TOOTHPASTE  Please do not use if you have an allergy to CHG or antibacterial soaps. If your skin becomes reddened/irritated stop using the CHG.  Do not shave (including legs and underarms) for at least 48 hours prior to first CHG shower. It is OK to shave your face.  Please follow these instructions carefully.   Shower  the NIGHT BEFORE SURGERY and the MORNING OF SURGERY  If you chose to wash your hair, wash your hair first as usual with your normal shampoo.  After you shampoo, rinse your hair and body thoroughly to remove the shampoo.  Use CHG Soap as you would any other liquid soap. You can apply CHG directly to the skin and wash gently with a scrungie or a clean washcloth.   Apply the CHG Soap to your body ONLY FROM THE NECK DOWN.  Do not use on open wounds or open sores. Avoid contact with your eyes, ears, mouth and genitals (private parts). Wash Face  and genitals (private parts)  with your normal soap.   Wash thoroughly, paying special attention to the area where your surgery will be performed.  Thoroughly rinse your body with warm water from the neck down.  DO NOT shower/wash with your normal soap after using and rinsing off the CHG Soap.  Pat yourself dry with a CLEAN TOWEL.  Wear CLEAN PAJAMAS to bed the night before surgery  Place CLEAN SHEETS on your bed the night before your surgery  DO NOT SLEEP WITH PETS.   Day of Surgery: Shower with CHG soap. Do not wear jewelry, make up, nail polish, gel polish, artificial nails, or any other type of covering on natural nails including finger and toenails. If patients have artificial nails, gel coating, etc. that need to be removed by a nail salon please have this removed prior to surgery. Surgery may need to be canceled/delayed if the surgeon/ anesthesia feels like the patient is unable to be adequately monitored. Do not wear lotions, powders, perfumes, or deodorant. Do not shave 48 hours prior to surgery.   Do not bring valuables to the hospital. [facility] is not responsible for any belongings or valuables. Wear Clean/Comfortable clothing the morning of surgery Remember to brush your teeth WITH YOUR REGULAR TOOTHPASTE.   Please read over the following fact sheets that you were given.

## 2020-07-20 ENCOUNTER — Other Ambulatory Visit (HOSPITAL_COMMUNITY): Payer: Self-pay

## 2020-07-20 ENCOUNTER — Encounter (HOSPITAL_COMMUNITY): Payer: Self-pay

## 2020-07-20 ENCOUNTER — Other Ambulatory Visit: Payer: Self-pay

## 2020-07-20 ENCOUNTER — Encounter (HOSPITAL_COMMUNITY)
Admission: RE | Admit: 2020-07-20 | Discharge: 2020-07-20 | Disposition: A | Discharge status: Home / Self Care | Payer: 59 | Source: Ambulatory Visit | Attending: General Surgery | Admitting: General Surgery

## 2020-07-20 DIAGNOSIS — Z01812 Encounter for preprocedural laboratory examination: Secondary | ICD-10-CM | POA: Diagnosis not present

## 2020-07-20 HISTORY — DX: Unspecified osteoarthritis, unspecified site: M19.90

## 2020-07-20 HISTORY — DX: Unspecified asthma, uncomplicated: J45.909

## 2020-07-20 LAB — CBC
HCT: 38.1 % (ref 36.0–46.0)
Hemoglobin: 12.5 g/dL (ref 12.0–15.0)
MCH: 31.1 pg (ref 26.0–34.0)
MCHC: 32.8 g/dL (ref 30.0–36.0)
MCV: 94.8 fL (ref 80.0–100.0)
Platelets: 253 10*3/uL (ref 150–400)
RBC: 4.02 MIL/uL (ref 3.87–5.11)
RDW: 13.5 % (ref 11.5–15.5)
WBC: 7.7 10*3/uL (ref 4.0–10.5)
nRBC: 0 % (ref 0.0–0.2)

## 2020-07-20 MED FILL — Adalimumab Prefilled Syringe Kit 40 MG/0.4ML: SUBCUTANEOUS | 28 days supply | Qty: 2 | Fill #2 | Status: AC

## 2020-07-20 MED FILL — Celecoxib Cap 100 MG: ORAL | 30 days supply | Qty: 60 | Fill #1 | Status: AC

## 2020-07-20 NOTE — Progress Notes (Signed)
PCP - Dr. Tedra Senegal Cardiologist - denies  PPM/ICD - N/A  Chest x-ray - n/a EKG - n/a Stress Test - denies ECHO - denies Cardiac Cath - denies  Sleep Study - OSA+ CPAP - uses nightly   Blood Thinner Instructions: n/a Aspirin Instructions: n/a  ERAS Protcol -clear liquids until 0530 DOS PRE-SURGERY Ensure or G2- (1) pre-surgical Ensure  COVID TEST- not indicated. Ambulatory Surgery.  Anesthesia review: No  Patient denies shortness of breath, fever, cough and chest pain at PAT appointment   All instructions explained to the patient, with a verbal understanding of the material. Patient agrees to go over the instructions while at home for a better understanding. Patient also instructed to self quarantine after being tested for COVID-19. The opportunity to ask questions was provided.

## 2020-07-24 ENCOUNTER — Other Ambulatory Visit (HOSPITAL_COMMUNITY): Payer: 59

## 2020-07-25 NOTE — Anesthesia Preprocedure Evaluation (Addendum)
Anesthesia Evaluation  Patient identified by MRN, date of birth, ID band Patient awake    Reviewed: Allergy & Precautions, H&P , NPO status , Patient's Chart, lab work & pertinent test results  Airway Mallampati: III  TM Distance: >3 FB Neck ROM: Full    Dental no notable dental hx. (+) Teeth Intact, Dental Advisory Given   Pulmonary asthma , sleep apnea and Continuous Positive Airway Pressure Ventilation ,    Pulmonary exam normal breath sounds clear to auscultation       Cardiovascular Exercise Tolerance: Good negative cardio ROS   Rhythm:Regular Rate:Normal     Neuro/Psych Anxiety Depression negative neurological ROS     GI/Hepatic Neg liver ROS, GERD  Medicated,  Endo/Other  negative endocrine ROS  Renal/GU negative Renal ROS  negative genitourinary   Musculoskeletal  (+) Arthritis ,   Abdominal   Peds  Hematology negative hematology ROS (+)   Anesthesia Other Findings   Reproductive/Obstetrics negative OB ROS                            Anesthesia Physical Anesthesia Plan  ASA: 3  Anesthesia Plan: General   Post-op Pain Management:    Induction: Intravenous  PONV Risk Score and Plan: 4 or greater and Ondansetron, Dexamethasone and Midazolam  Airway Management Planned: Oral ETT  Additional Equipment:   Intra-op Plan:   Post-operative Plan: Extubation in OR  Informed Consent: I have reviewed the patients History and Physical, chart, labs and discussed the procedure including the risks, benefits and alternatives for the proposed anesthesia with the patient or authorized representative who has indicated his/her understanding and acceptance.     Dental advisory given  Plan Discussed with: CRNA  Anesthesia Plan Comments:        Anesthesia Quick Evaluation

## 2020-07-26 ENCOUNTER — Other Ambulatory Visit: Payer: Self-pay

## 2020-07-26 ENCOUNTER — Ambulatory Visit (HOSPITAL_COMMUNITY): Payer: 59 | Admitting: Anesthesiology

## 2020-07-26 ENCOUNTER — Other Ambulatory Visit (HOSPITAL_COMMUNITY): Payer: Self-pay

## 2020-07-26 ENCOUNTER — Ambulatory Visit (HOSPITAL_COMMUNITY)
Admission: RE | Admit: 2020-07-26 | Discharge: 2020-07-26 | Disposition: A | Discharge status: Home / Self Care | Payer: 59 | Attending: General Surgery | Admitting: General Surgery

## 2020-07-26 ENCOUNTER — Encounter (HOSPITAL_COMMUNITY): Payer: Self-pay | Admitting: General Surgery

## 2020-07-26 ENCOUNTER — Encounter (HOSPITAL_COMMUNITY)
Admission: RE | Disposition: A | Discharge status: Home / Self Care | Payer: Self-pay | Source: Home / Self Care | Attending: General Surgery

## 2020-07-26 DIAGNOSIS — K439 Ventral hernia without obstruction or gangrene: Secondary | ICD-10-CM | POA: Diagnosis not present

## 2020-07-26 DIAGNOSIS — G4733 Obstructive sleep apnea (adult) (pediatric): Secondary | ICD-10-CM | POA: Diagnosis not present

## 2020-07-26 DIAGNOSIS — Z9989 Dependence on other enabling machines and devices: Secondary | ICD-10-CM | POA: Diagnosis not present

## 2020-07-26 DIAGNOSIS — K5090 Crohn's disease, unspecified, without complications: Secondary | ICD-10-CM | POA: Diagnosis not present

## 2020-07-26 DIAGNOSIS — Z79890 Hormone replacement therapy (postmenopausal): Secondary | ICD-10-CM | POA: Insufficient documentation

## 2020-07-26 DIAGNOSIS — F419 Anxiety disorder, unspecified: Secondary | ICD-10-CM | POA: Insufficient documentation

## 2020-07-26 DIAGNOSIS — K429 Umbilical hernia without obstruction or gangrene: Secondary | ICD-10-CM | POA: Diagnosis not present

## 2020-07-26 HISTORY — PX: INSERTION OF MESH: SHX5868

## 2020-07-26 HISTORY — PX: VENTRAL HERNIA REPAIR: SHX424

## 2020-07-26 SURGERY — REPAIR, HERNIA, VENTRAL, LAPAROSCOPIC
Anesthesia: General | Site: Abdomen

## 2020-07-26 MED ORDER — CEFAZOLIN SODIUM-DEXTROSE 2-4 GM/100ML-% IV SOLN
2.0000 g | INTRAVENOUS | Status: AC
  Administered 2020-07-26: 2 g via INTRAVENOUS
  Filled 2020-07-26: qty 100

## 2020-07-26 MED ORDER — KETOROLAC TROMETHAMINE 30 MG/ML IJ SOLN
INTRAMUSCULAR | Status: DC | PRN
  Administered 2020-07-26: 30 mg via INTRAVENOUS

## 2020-07-26 MED ORDER — CHLORHEXIDINE GLUCONATE CLOTH 2 % EX PADS: 6.0000 | MEDICATED_PAD | Freq: Once | CUTANEOUS | Status: DC

## 2020-07-26 MED ORDER — TRAMADOL HCL 50 MG PO TABS
50.0000 mg | ORAL_TABLET | Freq: Four times a day (QID) | ORAL | 0 refills | Status: DC | PRN
  Filled 2020-07-26: qty 20, 5d supply, fill #0

## 2020-07-26 MED ORDER — PROPOFOL 10 MG/ML IV BOLUS
INTRAVENOUS | Status: AC
  Filled 2020-07-26: qty 20

## 2020-07-26 MED ORDER — 0.9 % SODIUM CHLORIDE (POUR BTL) OPTIME
TOPICAL | Status: DC | PRN
  Administered 2020-07-26: 1000 mL

## 2020-07-26 MED ORDER — HYDROMORPHONE HCL 1 MG/ML IJ SOLN
INTRAMUSCULAR | Status: AC
  Administered 2020-07-26: 0.5 mg via INTRAVENOUS
  Filled 2020-07-26: qty 1

## 2020-07-26 MED ORDER — ONDANSETRON HCL 4 MG/2ML IJ SOLN
INTRAMUSCULAR | Status: DC | PRN
  Administered 2020-07-26: 4 mg via INTRAVENOUS

## 2020-07-26 MED ORDER — DEXAMETHASONE SODIUM PHOSPHATE 10 MG/ML IJ SOLN
INTRAMUSCULAR | Status: DC | PRN
  Administered 2020-07-26: 10 mg via INTRAVENOUS

## 2020-07-26 MED ORDER — BUPIVACAINE HCL (PF) 0.25 % IJ SOLN
INTRAMUSCULAR | Status: DC | PRN
  Administered 2020-07-26: 6 mL

## 2020-07-26 MED ORDER — TRAMADOL HCL 50 MG PO TABS: 50.0000 mg | ORAL_TABLET | Freq: Once | ORAL | Status: AC

## 2020-07-26 MED ORDER — ROCURONIUM BROMIDE 10 MG/ML (PF) SYRINGE
PREFILLED_SYRINGE | INTRAVENOUS | Status: DC | PRN
  Administered 2020-07-26: 50 mg via INTRAVENOUS

## 2020-07-26 MED ORDER — HYDROMORPHONE HCL 1 MG/ML IJ SOLN
0.2500 mg | INTRAMUSCULAR | Status: DC | PRN
  Administered 2020-07-26: 0.5 mg via INTRAVENOUS

## 2020-07-26 MED ORDER — CHLORHEXIDINE GLUCONATE 0.12 % MT SOLN
OROMUCOSAL | Status: AC
  Filled 2020-07-26: qty 15

## 2020-07-26 MED ORDER — PROPOFOL 10 MG/ML IV BOLUS
INTRAVENOUS | Status: DC | PRN
  Administered 2020-07-26: 100 mg via INTRAVENOUS

## 2020-07-26 MED ORDER — FENTANYL CITRATE (PF) 250 MCG/5ML IJ SOLN
INTRAMUSCULAR | Status: AC
  Filled 2020-07-26: qty 5

## 2020-07-26 MED ORDER — FENTANYL CITRATE (PF) 250 MCG/5ML IJ SOLN
INTRAMUSCULAR | Status: DC | PRN
  Administered 2020-07-26 (×3): 50 ug via INTRAVENOUS

## 2020-07-26 MED ORDER — ONDANSETRON HCL 4 MG/2ML IJ SOLN
INTRAMUSCULAR | Status: AC
  Filled 2020-07-26: qty 2

## 2020-07-26 MED ORDER — MIDAZOLAM HCL 2 MG/2ML IJ SOLN
INTRAMUSCULAR | Status: AC
  Filled 2020-07-26: qty 2

## 2020-07-26 MED ORDER — MIDAZOLAM HCL 5 MG/5ML IJ SOLN
INTRAMUSCULAR | Status: DC | PRN
  Administered 2020-07-26: 2 mg via INTRAVENOUS

## 2020-07-26 MED ORDER — ROCURONIUM BROMIDE 10 MG/ML (PF) SYRINGE
PREFILLED_SYRINGE | INTRAVENOUS | Status: AC
  Filled 2020-07-26: qty 10

## 2020-07-26 MED ORDER — TRAMADOL HCL 50 MG PO TABS
ORAL_TABLET | ORAL | Status: AC
  Administered 2020-07-26: 50 mg via ORAL
  Filled 2020-07-26: qty 1

## 2020-07-26 MED ORDER — SUGAMMADEX SODIUM 200 MG/2ML IV SOLN
INTRAVENOUS | Status: DC | PRN
  Administered 2020-07-26 (×2): 100 mg via INTRAVENOUS

## 2020-07-26 MED ORDER — ACETAMINOPHEN 500 MG PO TABS
1000.0000 mg | ORAL_TABLET | ORAL | Status: AC
  Administered 2020-07-26: 1000 mg via ORAL
  Filled 2020-07-26: qty 2

## 2020-07-26 MED ORDER — PHENYLEPHRINE HCL-NACL 10-0.9 MG/250ML-% IV SOLN
INTRAVENOUS | Status: AC
  Filled 2020-07-26: qty 500

## 2020-07-26 MED ORDER — ENSURE PRE-SURGERY PO LIQD: 296.0000 mL | Freq: Once | ORAL | Status: DC

## 2020-07-26 MED ORDER — LACTATED RINGERS IV SOLN: INTRAVENOUS | Status: DC

## 2020-07-26 MED ORDER — KETOROLAC TROMETHAMINE 30 MG/ML IJ SOLN
INTRAMUSCULAR | Status: AC
  Filled 2020-07-26: qty 1

## 2020-07-26 MED ORDER — LIDOCAINE 2% (20 MG/ML) 5 ML SYRINGE
INTRAMUSCULAR | Status: DC | PRN
  Administered 2020-07-26: 60 mg via INTRAVENOUS

## 2020-07-26 MED ORDER — DEXAMETHASONE SODIUM PHOSPHATE 10 MG/ML IJ SOLN
INTRAMUSCULAR | Status: AC
  Filled 2020-07-26: qty 1

## 2020-07-26 SURGICAL SUPPLY — 52 items
BAG COUNTER SPONGE SURGICOUNT (BAG) ×3 IMPLANT
BINDER ABDOMINAL 12 ML 46-62 (SOFTGOODS) ×3 IMPLANT
BLADE CLIPPER SURG (BLADE) IMPLANT
COVER SURGICAL LIGHT HANDLE (MISCELLANEOUS) ×3 IMPLANT
DERMABOND ADVANCED (GAUZE/BANDAGES/DRESSINGS) ×1
DERMABOND ADVANCED .7 DNX12 (GAUZE/BANDAGES/DRESSINGS) ×2 IMPLANT
DEVICE SECURE STRAP 25 ABSORB (INSTRUMENTS) ×3 IMPLANT
DEVICE TROCAR PUNCTURE CLOSURE (ENDOMECHANICALS) ×3 IMPLANT
DRAIN CHANNEL 19F RND (DRAIN) IMPLANT
DRAPE INCISE IOBAN 66X45 STRL (DRAPES) ×3 IMPLANT
DRAPE LAPAROSCOPIC ABDOMINAL (DRAPES) ×3 IMPLANT
DRSG OPSITE POSTOP 4X10 (GAUZE/BANDAGES/DRESSINGS) IMPLANT
DRSG OPSITE POSTOP 4X6 (GAUZE/BANDAGES/DRESSINGS) IMPLANT
DRSG OPSITE POSTOP 4X8 (GAUZE/BANDAGES/DRESSINGS) IMPLANT
ELECT CAUTERY BLADE 6.4 (BLADE) ×3 IMPLANT
ELECT REM PT RETURN 9FT ADLT (ELECTROSURGICAL) ×3
ELECTRODE REM PT RTRN 9FT ADLT (ELECTROSURGICAL) ×2 IMPLANT
EVACUATOR SILICONE 100CC (DRAIN) IMPLANT
GLOVE SRG 8 PF TXTR STRL LF DI (GLOVE) ×2 IMPLANT
GLOVE SURG ENC MOIS LTX SZ7.5 (GLOVE) ×3 IMPLANT
GLOVE SURG UNDER POLY LF SZ8 (GLOVE) ×3
GOWN STRL REUS W/ TWL LRG LVL3 (GOWN DISPOSABLE) ×2 IMPLANT
GOWN STRL REUS W/ TWL XL LVL3 (GOWN DISPOSABLE) ×2 IMPLANT
GOWN STRL REUS W/TWL LRG LVL3 (GOWN DISPOSABLE) ×3
GOWN STRL REUS W/TWL XL LVL3 (GOWN DISPOSABLE) ×3
GRASPER SUT TROCAR 14GX15 (MISCELLANEOUS) IMPLANT
KIT BASIN OR (CUSTOM PROCEDURE TRAY) ×3 IMPLANT
KIT TURNOVER KIT B (KITS) ×3 IMPLANT
MESH VENTRALIGHT ST 4.5IN (Mesh General) ×3 IMPLANT
NDL INSUFFICATION SHT 14GA (NEEDLE) ×3 IMPLANT
NS IRRIG 1000ML POUR BTL (IV SOLUTION) ×3 IMPLANT
PACK GENERAL/GYN (CUSTOM PROCEDURE TRAY) ×3 IMPLANT
PAD ARMBOARD 7.5X6 YLW CONV (MISCELLANEOUS) ×3 IMPLANT
PENCIL SMOKE EVACUATOR (MISCELLANEOUS) ×3 IMPLANT
POUCH LAPAROSCOPIC INSTRUMENT (MISCELLANEOUS) ×3 IMPLANT
SCISSORS LAP 5X35 DISP (ENDOMECHANICALS) ×3 IMPLANT
SET TUBE SMOKE EVAC HIGH FLOW (TUBING) ×3 IMPLANT
SLEEVE ENDOPATH XCEL 5M (ENDOMECHANICALS) ×3 IMPLANT
SOL ANTI FOG 6CC (MISCELLANEOUS) ×2 IMPLANT
SOLUTION ANTI FOG 6CC (MISCELLANEOUS) ×1
STAPLER VISISTAT 35W (STAPLE) IMPLANT
SUT CHROMIC 2 0 SH (SUTURE) ×3 IMPLANT
SUT ETHIBOND 0 MO6 C/R (SUTURE) ×3 IMPLANT
SUT ETHILON 2 0 FS 18 (SUTURE) IMPLANT
SUT PDS AB 1 TP1 54 (SUTURE) IMPLANT
SUT PDS AB 2-0 CT1 27 (SUTURE) IMPLANT
SUT VIC AB 3-0 SH 18 (SUTURE) IMPLANT
TOWEL GREEN STERILE (TOWEL DISPOSABLE) ×3 IMPLANT
TOWEL GREEN STERILE FF (TOWEL DISPOSABLE) ×3 IMPLANT
TRAY FOLEY W/BAG SLVR 16FR (SET/KITS/TRAYS/PACK)
TRAY FOLEY W/BAG SLVR 16FR ST (SET/KITS/TRAYS/PACK) IMPLANT
TROCAR XCEL BLADELESS 5X75MML (TROCAR) ×3 IMPLANT

## 2020-07-26 NOTE — Anesthesia Postprocedure Evaluation (Signed)
Anesthesia Post Note  Patient: [name]  Procedure(s) Performed: LAPAROSCOPIC VENTRAL HERNIA REPAIR WITH MESH (Abdomen) INSERTION OF MESH (Abdomen)     Patient location during evaluation: PACU Anesthesia Type: General Level of consciousness: awake and alert Pain management: pain level controlled Vital Signs Assessment: post-procedure vital signs reviewed and stable Respiratory status: spontaneous breathing, nonlabored ventilation and respiratory function stable Cardiovascular status: blood pressure returned to baseline and stable Postop Assessment: no apparent nausea or vomiting Anesthetic complications: no   No notable events documented.  Last Vitals:  Vitals:   07/26/20 1030 07/26/20 1035  BP:  (!) 103/50  Pulse: 72 63  Resp: 19 13  Temp:    SpO2: 98% 92%    Last Pain:  Vitals:   07/26/20 1030  TempSrc:   PainSc: 4                  [name],W. EDMOND

## 2020-07-26 NOTE — Op Note (Signed)
07/26/2020  9:11 AM  PATIENT:  [name]  60 y.o. female  PRE-OPERATIVE DIAGNOSIS:  VENTRAL HERNIA  POST-OPERATIVE DIAGNOSIS:  1cm VENTRAL HERNIA  PROCEDURE:  Procedure(s): LAPAROSCOPIC VENTRAL HERNIA REPAIR WITH MESH (N/A)  SURGEON:  Surgeon(s) and Role:    * Ralene Ok, MD - Primary  ANESTHESIA:   local and general  EBL:  <5cc   BLOOD ADMINISTERED:none  DRAINS: none   LOCAL MEDICATIONS USED:  BUPIVICAINE   SPECIMEN:  No Specimen  DISPOSITION OF SPECIMEN:  N/A  COUNTS:  YES  TOURNIQUET:  * No tourniquets in log *  DICTATION: .Dragon Dictation   Details of the procedure:   After the patient was consented patient was taken back to the operating room patient was then placed in supine position bilateral SCDs in place.  The patient was prepped and draped in the usual sterile fashion. After antibiotics were confirmed a timeout was called and all facts were verified. The Veress needle technique was used to insuflate the abdomen at Palmer's point. The abdomen was insufflated to 14 mm mercury. Subsequently a 5 mm trocar was placed a camera inserted there was no injury to any intra-abdominal organs.    There was seen to be an 1cm non-incarcerated  epigastric hernia.  A second camera port was in placed into the left lower quadrant.   At this the Falicform ligament was taken down with Bovie cautery maintaining hemostasis.   I proceeded to reduce the hernia contents.  The hernia sac was dissected out of the hernia and disposed.  The fascia at the hernia was reapproximated using a #0 Ethibonds x 3.  Once the hernia was cleared away, a Bard Ventralight 11.4cm  mesh was inserted into the abdomen.  The mesh was secured circumferentially with am Securestrap tacker in a double crown fashion.    The omentum was brought over the area of the mesh. The pneumoperitoneum was evacuated  & all trocars  were removed. The skin was reapproximated with 4-0  Monocryl sutures in a  subcuticular fashion. The skin was dressed with Dermabond.  The patient was taken to the recovery room in stable condition.  Type of repair -primary suture & mesh  Mesh overlap - 5cm  Placement of mesh -  beneath fascia and into peritoneal cavity   PLAN OF CARE: Discharge to home after PACU  PATIENT DISPOSITION:  PACU - hemodynamically stable.   Delay start of Pharmacological VTE agent (>24hrs) due to surgical blood loss or risk of bleeding: not applicable

## 2020-07-26 NOTE — H&P (Signed)
History of Present Illness The patient is a 60 year old female who presents with an abdominal wall hernia. Chief Complaint: Ventral hernia  Patient is a 60 year old female, who comes in with a five-year history of ventral hernia.  Patient does have history of Crohn's and anxiety. Patient states that the hernia had previously been watched.  She states that currently causing her some issues with bending over.  Patient was gardening and felt  that she has significant pain with gardening, squatting, bending over.  This does affect her at that time as well as soreness thereafter.  She states that she does have to lay down to help with the discomfort.  She's had no signs or symptoms of incarceration or granulation. Patient had no previous abdominal surgery.    Past Surgical History Anal Fissure Repair   Colon Polyp Removal - Colonoscopy    Diagnostic Studies History  Colonoscopy   within last year Mammogram   1-3 years ago within last year  Allergies ( Hibiclens *ANTISEPTICS & DISINFECTANTS*   Allergies Reconciled    Medication History  Lexapro  (10MG Tablet, Oral) Active. Humira  (40MG/0.8ML Prefill Syr Kit, Subcutaneous) Active. Vitamin D (Cholecalciferol)  (1000UNIT Capsule, Oral) Active. Celecoxib  (100MG Capsule, Oral) Active. Estradiol-Norethindrone Acet  (1-0.5MG Tablet, Oral) Active. busPIRone HCl  (7.5MG Tablet, Oral) Active. Medications Reconciled   Social History  Alcohol use   Moderate alcohol use, Occasional alcohol use. Caffeine use   Coffee, Tea. No drug use   Tobacco use   Never smoker.  Family History Alcohol Abuse   Father, Mother, Sister, Son. Cerebrovascular Accident   Family Members In General. Heart disease in female family member before age 24   Ischemic Bowel Disease   Brother, Sister. Migraine Headache   Daughter. Respiratory Condition   Sister.  Pregnancy / Birth History  Age at menarche   15 years. Age of menopause   51-55 Contraceptive History    Oral contraceptives. Gravida   2 Irregular periods   Length (months) of breastfeeding   7-12 Maternal age   46-30 Para   2  Other Problems  Anxiety Disorder   Arthritis   Asthma   Back Pain   Crohn's Disease   Depression   Diverticulosis   Gastroesophageal Reflux Disease   Hemorrhoids   Hypercholesterolemia   Sleep Apnea   Ulcerative Colitis   Ventral Hernia Repair      Review of Systems  General Present- Night Sweats. Not Present- Appetite Loss, Chills, Fatigue, Fever, Weight Gain and Weight Loss. Skin Not Present- Change in Wart/Mole, Dryness, Hives, Jaundice, New Lesions, Non-Healing Wounds, Rash and Ulcer. HEENT Present- Ringing in the Ears, Seasonal Allergies and Wears glasses/contact lenses. Not Present- Earache, Hearing Loss, Hoarseness, Nose Bleed, Oral Ulcers, Sinus Pain, Sore Throat, Visual Disturbances and Yellow Eyes. Respiratory Not Present- Bloody sputum, Chronic Cough, Difficulty Breathing, Snoring and Wheezing. Breast Not Present- Breast Mass, Breast Pain, Nipple Discharge and Skin Changes. Cardiovascular Not Present- Chest Pain, Difficulty Breathing Lying Down, Leg Cramps, Palpitations, Rapid Heart Rate, Shortness of Breath and Swelling of Extremities. Gastrointestinal Present- Hemorrhoids. Not Present- Abdominal Pain, Bloating, Bloody Stool, Change in Bowel Habits, Chronic diarrhea, Constipation, Difficulty Swallowing, Excessive gas, Gets full quickly at meals, Indigestion, Nausea, Rectal Pain and Vomiting. Female Genitourinary Not Present- Frequency, Nocturia, Painful Urination, Pelvic Pain and Urgency. Musculoskeletal Present- Back Pain, Joint Pain, Joint Stiffness and Muscle Pain. Not Present- Muscle Weakness and Swelling of Extremities. Neurological Not Present- Decreased Memory, Fainting, Headaches, Numbness, Seizures, Tingling,  Tremor, Trouble walking and Weakness. Psychiatric Present- Anxiety and Depression. Not Present- Bipolar, Change in Sleep Pattern,  Fearful and Frequent crying. Endocrine Present- Hot flashes. Not Present- Cold Intolerance, Excessive Hunger, Hair Changes, Heat Intolerance and New Diabetes. All other systems negative  Vitals  05/26/2020 8:26 AM Weight: 145 lb   Height: 63 in  Body Surface Area: 1.69 m   Body Mass Index: 25.69 kg/m   Temp.: 98.3 F    Pulse: 79 (Regular)    BP: 110/70(Sitting, Left Arm, Standard)       Physical ExamThe physical exam findings are as follows: Note:   Constitutional: No acute distress, conversant, appears stated age  Eyes: Anicteric sclerae, moist conjunctiva, no lid lag  Neck: No thyromegaly, trachea midline, no cervical lymphadenopathy  Lungs: Clear to auscultation biilaterally, normal respiratory effot  Cardiovascular: regular rate & rhythm, no murmurs, no peripheal edema, pedal pulses 2+  GI: Soft, no masses or hepatosplenomegaly, non-tender to palpation  MSK: Normal gait, no clubbing cyanosis, edema  Skin: No rashes, palpation reveals normal skin turgor  Psychiatric: Appropriate judgment and insight, oriented to person, place, and time  Abdomen Inspection Hernias - Ventral - Reducible  (just sup to umb) .    Assessment & Plan VENTRAL HERNIA WITHOUT OBSTRUCTION OR GANGRENE (K43.9) Impression: 60 year old female with history of Crohn's, and anxiety. Patient has a primary ventral hernia.  1. The patient will like to proceed to the operating room for laparoscopic ventral hernia repair with mesh.  2. I discussed with the patient the signs and symptoms of incarceration and strangulation and the need to proceed to the ER should they occur.  3. I discussed with the patient the risks and benefits of the procedure to include but not limited to: Infection, bleeding, damage to surrounding structures, possible need for further surgery, possible nerve pain, and possible recurrence. The patient was understanding and wishes to proceed.

## 2020-07-26 NOTE — Anesthesia Procedure Notes (Signed)
Procedure Name: Intubation Date/Time: 07/26/2020 8:36 AM Performed by: Merryl Hacker, RN Pre-anesthesia Checklist: Patient identified, Emergency Drugs available, Suction available and Patient being monitored Patient Re-evaluated:Patient Re-evaluated prior to induction Oxygen Delivery Method: Circle System Utilized Preoxygenation: Pre-oxygenation with 100% oxygen Induction Type: IV induction Ventilation: Mask ventilation without difficulty Laryngoscope Size: Mac and 3 Grade View: Grade II Tube type: Oral Tube size: 7.0 mm Number of attempts: 1 Airway Equipment and Method: Stylet Placement Confirmation: ETT inserted through vocal cords under direct vision, positive ETCO2 and breath sounds checked- equal and bilateral Secured at: 21 cm Tube secured with: Tape Dental Injury: Teeth and Oropharynx as per pre-operative assessment  Comments: Performed by Heide Scales, SRNA under direct supervision by Dr. Lissa Hoard and Rhae Lerner, CRNA

## 2020-07-26 NOTE — Discharge Instructions (Signed)
[facility] _______[facility]  HERNIA REPAIR: POST OP INSTRUCTIONS  Always review your discharge instruction sheet given to you by the facility where your surgery was performed. IF YOU HAVE DISABILITY OR FAMILY LEAVE FORMS, YOU MUST BRING THEM TO THE OFFICE FOR PROCESSING.   DO NOT GIVE THEM TO YOUR DOCTOR.  1. A  prescription for pain medication may be given to you upon discharge.  Take your pain medication as prescribed, if needed.  If narcotic pain medicine is not needed, then you may take acetaminophen (Tylenol) or ibuprofen (Advil) as needed. 2. Take your usually prescribed medications unless otherwise directed. If you need a refill on your pain medication, please contact your pharmacy.  They will contact our office to request authorization. Prescriptions will not be filled after 5 pm or on week-ends. 3. You should follow a light diet the first 24 hours after arrival home, such as soup and crackers, etc.  Be sure to include lots of fluids daily.  Resume your normal diet the day after surgery. 4.Most patients will experience some swelling and bruising around the umbilicus or in the groin and scrotum.  Ice packs and reclining will help.  Swelling and bruising can take several days to resolve.  6. It is common to experience some constipation if taking pain medication after surgery.  Increasing fluid intake and taking a stool softener (such as Colace) will usually help or prevent this problem from occurring.  A mild laxative (Milk of Magnesia or Miralax) should be taken according to package directions if there are no bowel movements after 48 hours. 7. Unless discharge instructions indicate otherwise, you may remove your bandages 24-48 hours after surgery, and you may shower at that time.  You may have steri-strips (small skin tapes) in place directly over the incision.  These strips should be left on the skin for 7-10 days.  If your surgeon used skin glue on the incision, you may shower in  24 hours.  The glue will flake off over the next 2-3 weeks.  Any sutures or staples will be removed at the office during your follow-up visit. 8. ACTIVITIES:  You may resume regular (light) daily activities beginning the next day--such as daily self-care, walking, climbing stairs--gradually increasing activities as tolerated.  You may have sexual intercourse when it is comfortable.  Refrain from any heavy lifting or straining until approved by your doctor.  a.You may drive when you are no longer taking prescription pain medication, you can comfortably wear a seatbelt, and you can safely maneuver your car and apply brakes. b.RETURN TO WORK:   _____________________________________________  9.You should see your doctor in the office for a follow-up appointment approximately 2-3 weeks after your surgery.  Make sure that you call for this appointment within a day or two after you arrive home to insure a convenient appointment time. 10.OTHER INSTRUCTIONS: _________________________    _____________________________________  WHEN TO CALL YOUR DOCTOR: Fever over 101.0 Inability to urinate Nausea and/or vomiting Extreme swelling or bruising Continued bleeding from incision. Increased pain, redness, or drainage from the incision  The clinic staff is available to answer your questions during regular business hours.  Please don't hesitate to call and ask to speak to one of the nurses for clinical concerns.  If you have a medical emergency, go to the nearest emergency room or call 911.  A surgeon from [facility] is always on call at the hospital   [address], [place], [place], [place]  [address] ?  [address] [place], [place]   [address] [phone] ? [phone] ? FAX [phone] Web site: [email or web address]

## 2020-07-26 NOTE — Transfer of Care (Signed)
Immediate Anesthesia Transfer of Care Note  Patient: Cellie Dardis Sout[place]  Procedure(s) Performed: LAPAROSCOPIC VENTRAL HERNIA REPAIR WITH MESH (Abdomen) INSERTION OF MESH (Abdomen)  Patient Location: PACU  Anesthesia Type:General  Level of Consciousness: awake, alert  and oriented  Airway & Oxygen Therapy: Patient Spontanous Breathing  Post-op Assessment: Report given to RN, Post -op Vital signs reviewed and stable and Patient moving all extremities  Post vital signs: Reviewed and stable  Last Vitals:  Vitals Value Taken Time  BP 130/64   Temp    Pulse 66 07/26/20 0921  Resp 13 07/26/20 0921  SpO2 94 % 07/26/20 0921  Vitals shown include unvalidated device data.  Last Pain:  Vitals:   07/26/20 0659  TempSrc:   PainSc: 0-No pain         Complications: No notable events documented.

## 2020-07-27 ENCOUNTER — Encounter (HOSPITAL_COMMUNITY): Payer: Self-pay | Admitting: General Surgery

## 2020-08-04 ENCOUNTER — Other Ambulatory Visit (HOSPITAL_COMMUNITY): Payer: Self-pay

## 2020-08-04 MED FILL — Estradiol & Norethindrone Acetate Tab 1-0.5 MG: ORAL | 84 days supply | Qty: 84 | Fill #1 | Status: AC

## 2020-08-15 ENCOUNTER — Other Ambulatory Visit (HOSPITAL_COMMUNITY): Payer: Self-pay

## 2020-08-15 MED FILL — Adalimumab Prefilled Syringe Kit 40 MG/0.4ML: SUBCUTANEOUS | 28 days supply | Qty: 2 | Fill #3 | Status: AC

## 2020-08-24 ENCOUNTER — Other Ambulatory Visit (HOSPITAL_COMMUNITY): Payer: Self-pay

## 2020-09-06 ENCOUNTER — Other Ambulatory Visit: Payer: Self-pay | Admitting: Family Medicine

## 2020-09-06 ENCOUNTER — Other Ambulatory Visit: Payer: Self-pay | Admitting: Internal Medicine

## 2020-09-06 ENCOUNTER — Other Ambulatory Visit (HOSPITAL_COMMUNITY): Payer: Self-pay

## 2020-09-06 MED ORDER — BUSPIRONE HCL 7.5 MG PO TABS
7.5000 mg | ORAL_TABLET | Freq: Two times a day (BID) | ORAL | 2 refills | Status: DC
  Filled 2020-09-06: qty 180, 90d supply, fill #0
  Filled 2021-01-09: qty 180, 90d supply, fill #1
  Filled 2021-04-27: qty 180, 90d supply, fill #2

## 2020-09-06 MED ORDER — VITAMIN D (ERGOCALCIFEROL) 1.25 MG (50000 UNIT) PO CAPS
50000.0000 [IU] | ORAL_CAPSULE | ORAL | 0 refills | Status: DC
  Filled 2020-09-06: qty 12, 84d supply, fill #0

## 2020-09-07 ENCOUNTER — Other Ambulatory Visit (HOSPITAL_COMMUNITY): Payer: Self-pay

## 2020-09-07 ENCOUNTER — Other Ambulatory Visit: Payer: Self-pay | Admitting: Ophthalmology

## 2020-09-08 ENCOUNTER — Other Ambulatory Visit (HOSPITAL_COMMUNITY): Payer: Self-pay

## 2020-09-08 MED ORDER — LUMIGAN 0.01 % OP SOLN
1.0000 [drp] | Freq: Every day | OPHTHALMIC | 6 refills | Status: DC
  Filled 2020-09-08: qty 2.5, 25d supply, fill #0
  Filled 2020-11-23: qty 2.5, 25d supply, fill #1
  Filled 2021-02-06: qty 2.5, 25d supply, fill #2
  Filled 2021-05-02: qty 2.5, 25d supply, fill #3
  Filled 2021-07-20: qty 2.5, 25d supply, fill #4

## 2020-09-13 ENCOUNTER — Encounter: Payer: Self-pay | Admitting: Internal Medicine

## 2020-09-13 DIAGNOSIS — L570 Actinic keratosis: Secondary | ICD-10-CM | POA: Diagnosis not present

## 2020-09-13 DIAGNOSIS — L820 Inflamed seborrheic keratosis: Secondary | ICD-10-CM | POA: Diagnosis not present

## 2020-09-13 DIAGNOSIS — L609 Nail disorder, unspecified: Secondary | ICD-10-CM | POA: Diagnosis not present

## 2020-09-13 DIAGNOSIS — D2272 Melanocytic nevi of left lower limb, including hip: Secondary | ICD-10-CM | POA: Diagnosis not present

## 2020-09-13 DIAGNOSIS — L814 Other melanin hyperpigmentation: Secondary | ICD-10-CM | POA: Diagnosis not present

## 2020-09-13 DIAGNOSIS — D225 Melanocytic nevi of trunk: Secondary | ICD-10-CM | POA: Diagnosis not present

## 2020-09-13 DIAGNOSIS — L603 Nail dystrophy: Secondary | ICD-10-CM | POA: Diagnosis not present

## 2020-09-13 DIAGNOSIS — D2271 Melanocytic nevi of right lower limb, including hip: Secondary | ICD-10-CM | POA: Diagnosis not present

## 2020-09-13 DIAGNOSIS — L821 Other seborrheic keratosis: Secondary | ICD-10-CM | POA: Diagnosis not present

## 2020-09-13 DIAGNOSIS — D2239 Melanocytic nevi of other parts of face: Secondary | ICD-10-CM | POA: Diagnosis not present

## 2020-09-15 ENCOUNTER — Other Ambulatory Visit (HOSPITAL_COMMUNITY): Payer: Self-pay

## 2020-09-18 NOTE — Progress Notes (Signed)
[name] [place] [address] [name] Phone: [phone] Subjective:   [name], am serving as a scribe for Dr. Hulan Saas. This visit occurred during the SARS-CoV-2 public health emergency.  Safety protocols were in place, including screening questions prior to the visit, additional usage of staff PPE, and extensive cleaning of exam room while observing appropriate contact time as indicated for disinfecting solutions.   I'm seeing this patient by the request  of:  [name], Cresenciano Lick, MD  CC: Neck and low back pain follow-up  [id number]  [name] is a 60 y.o. female coming in with complaint of back and neck pain. OMT on 07/18/2020/patient did undergo a laparoscopic hernia repair 6 weeks ago.  Patient states left hip still hurts, but it's not getting much better.  Patient states that just chronically it is there.  Medications patient has been prescribed: Vit D  Taking:         Reviewed prior external information including notes and imaging from previsou exam, outside providers and external EMR if available.   As well as notes that were available from care everywhere and other healthcare systems.  Past medical history, social, surgical and family history all reviewed in electronic medical record.  No pertanent information unless stated regarding to the chief complaint.   Past Medical History:  Diagnosis Date   Anxiety    Allergy induced asthma   Arthritis    Asthma    Crohn disease ([place])    Depression    GERD (gastroesophageal reflux disease)    History of methicillin resistant staphylococcus aureus (MRSA)    Sleep apnea    CPAP    Allergies  Allergen Reactions   Chlorhexidine Gluconate Itching   Hydrocodone Itching     Review of Systems:  No headache, visual changes, nausea, vomiting, diarrhea, constipation, dizziness, abdominal pain, skin rash, fevers, chills, night sweats, weight loss, swollen lymph nodes,  body aches, joint swelling, chest pain, shortness of breath, mood changes. POSITIVE muscle aches  Objective  Blood pressure 106/60, pulse 84, height 5' 2"  (1.575 m), weight 148 lb (67.1 kg), last menstrual period 01/10/2014, SpO2 98 %.   General: No apparent distress alert and oriented x3 mood and affect normal, dressed appropriately.  HEENT: Pupils equal, extraocular movements intact  Respiratory: Patient's speak in full sentences and does not appear short of breath  Cardiovascular: No lower extremity edema, non tender, no erythema  Low back exam shows tenderness noted over the left sacroiliac joint.  Patient does have pain in the left gluteal area as well.  Lack of lordosis noted of the lumbar spine. Neck exam shows  Osteopathic findings  C2 flexed rotated and side bent right C5 flexed rotated and side bent left T4 extended rotated and side bent right inhaled rib T8 extended rotated and side bent left L2 flexed rotated and side bent right Sacrum left on left       Assessment and Plan:  Lumbar radiculopathy Concerned that some of this could be potentially secondary to some lumbar radiculopathy but I do think it is more secondary to more of the tightness of the musculature.  Patient could potentially respond well to a injection.  We discussed icing regimen and home exercises.  Increase activity slowly.  Follow-up with me again in 6 to 8 weeks   Nonallopathic problems  Decision today to treat with OMT was based on Physical Exam  After verbal consent patient was treated with HVLA,  ME, FPR techniques in cervical, rib, thoracic, lumbar, and sacral  areas  Patient tolerated the procedure well with improvement in symptoms  Patient given exercises, stretches and lifestyle modifications  See medications in patient instructions if given  Patient will follow up in 4-8 weeks     The above documentation has been reviewed and is accurate and complete Lyndal Pulley, DO         Note: This dictation was prepared with Dragon dictation along with smaller phrase technology. Any transcriptional errors that result from this process are unintentional.

## 2020-09-19 ENCOUNTER — Other Ambulatory Visit: Payer: Self-pay

## 2020-09-19 ENCOUNTER — Ambulatory Visit (INDEPENDENT_AMBULATORY_CARE_PROVIDER_SITE_OTHER): Payer: 59 | Admitting: Family Medicine

## 2020-09-19 VITALS — BP 106/60 | HR 84 | Ht 62.0 in | Wt 148.0 lb

## 2020-09-19 DIAGNOSIS — M9908 Segmental and somatic dysfunction of rib cage: Secondary | ICD-10-CM

## 2020-09-19 DIAGNOSIS — M5416 Radiculopathy, lumbar region: Secondary | ICD-10-CM | POA: Diagnosis not present

## 2020-09-19 DIAGNOSIS — M9903 Segmental and somatic dysfunction of lumbar region: Secondary | ICD-10-CM

## 2020-09-19 DIAGNOSIS — M9901 Segmental and somatic dysfunction of cervical region: Secondary | ICD-10-CM | POA: Diagnosis not present

## 2020-09-19 DIAGNOSIS — M9904 Segmental and somatic dysfunction of sacral region: Secondary | ICD-10-CM | POA: Diagnosis not present

## 2020-09-19 DIAGNOSIS — M9902 Segmental and somatic dysfunction of thoracic region: Secondary | ICD-10-CM

## 2020-09-19 NOTE — Patient Instructions (Signed)
Good to see you Start increasing activity See you again in 4 to 6 weeks

## 2020-09-19 NOTE — Assessment & Plan Note (Signed)
Concerned that some of this could be potentially secondary to some lumbar radiculopathy but I do think it is more secondary to more of the tightness of the musculature.  Patient could potentially respond well to a injection.  We discussed icing regimen and home exercises.  Increase activity slowly.  Follow-up with me again in 6 to 8 weeks

## 2020-09-28 ENCOUNTER — Other Ambulatory Visit (HOSPITAL_COMMUNITY): Payer: Self-pay

## 2020-10-02 ENCOUNTER — Ambulatory Visit: Payer: 59 | Attending: Family Medicine | Admitting: Pharmacist

## 2020-10-02 ENCOUNTER — Other Ambulatory Visit: Payer: Self-pay

## 2020-10-02 ENCOUNTER — Other Ambulatory Visit (HOSPITAL_COMMUNITY): Payer: Self-pay

## 2020-10-02 DIAGNOSIS — Z79899 Other long term (current) drug therapy: Secondary | ICD-10-CM

## 2020-10-02 MED ORDER — HUMIRA (2 SYRINGE) 40 MG/0.4ML ~~LOC~~ PSKT
PREFILLED_SYRINGE | SUBCUTANEOUS | 6 refills | Status: DC
  Filled 2020-10-02 – 2020-10-23 (×2): qty 2, 28d supply, fill #0
  Filled 2020-11-01: qty 2, 28d supply, fill #1
  Filled 2020-12-11: qty 2, 28d supply, fill #2
  Filled 2021-01-25: qty 2, 28d supply, fill #3
  Filled 2021-02-19: qty 2, 28d supply, fill #4
  Filled 2021-03-20: qty 2, 28d supply, fill #5
  Filled 2021-04-23: qty 2, 28d supply, fill #6

## 2020-10-02 MED ORDER — HUMIRA (2 SYRINGE) 40 MG/0.4ML ~~LOC~~ PSKT: PREFILLED_SYRINGE | SUBCUTANEOUS | 6 refills | Status: DC

## 2020-10-02 NOTE — Progress Notes (Signed)
S: Patient presents today for review of her Humira.  Patient is currently taking Humira for Crohn's Disease. Patient has been managed by Dr. Kathlen Mody at [facility] for this.   Adherence: denies any missed doses of Humira.    Dosing:  40 mg every 14 days.   Screening: TB test: completed prior to initiation  Hepatitis: completed prior to initiation  Efficacy: reports that it is working very well for her and that she has not really had any side effects  Monitoring: S/sx of infection: denies CBC: last one WNL (07/20/2020) S/sx of hypersensitivity: denies S/sx of malignancy: denies S/sx of heart failure: denies  O:  Lab Results  Component Value Date   WBC 7.7 07/20/2020   HGB 12.5 07/20/2020   HCT 38.1 07/20/2020   MCV 94.8 07/20/2020   PLT 253 07/20/2020      Chemistry      Component Value Date/Time   NA 138 03/02/2020 1100   K 4.1 03/02/2020 1100   CL 105 03/02/2020 1100   CO2 26 03/02/2020 1100   BUN 11 03/02/2020 1100   CREATININE 0.67 03/02/2020 1100      Component Value Date/Time   CALCIUM 8.6 03/02/2020 1100   ALKPHOS 31 (L) 02/11/2018 0951   AST 21 06/15/2020 0934   ALT 16 06/15/2020 0934   BILITOT 0.7 06/15/2020 0934     A/P: 1. Medication review: Patient tolerating Humira well and reports good efficacy. Labs have remained stable. She is regularly following with gastroenterologist at [facility] Reviewed the medication with the patient.. No recommendations for changes at this time.   Benard Halsted, PharmD, [place], [place] [phone]

## 2020-10-05 ENCOUNTER — Other Ambulatory Visit (HOSPITAL_COMMUNITY): Payer: Self-pay

## 2020-10-10 ENCOUNTER — Other Ambulatory Visit (HOSPITAL_COMMUNITY): Payer: Self-pay

## 2020-10-10 MED FILL — Celecoxib Cap 100 MG: ORAL | 30 days supply | Qty: 60 | Fill #2 | Status: AC

## 2020-10-18 NOTE — Progress Notes (Signed)
[name] [address] [place] Phone: [phone] Subjective:   [name], am serving as a scribe for Dr. Hulan Saas. This visit occurred during the SARS-CoV-2 public health emergency.  Safety protocols were in place, including screening questions prior to the visit, additional usage of staff PPE, and extensive cleaning of exam room while observing appropriate contact time as indicated for disinfecting solutions.   I'm seeing this patient by the request  of:  [name], Stacey Lick, MD  CC: Back and neck pain follow-up  [id number]  [name] is a 60 y.o. female coming in with complaint of back and neck pain. OMT on 09/19/2020. Patient states back and neck pain remain the same. No new complaints.  Medications patient has been prescribed: Vit D           Reviewed prior external information including notes and imaging from previsou exam, outside providers and external EMR if available.   As well as notes that were available from care everywhere and other healthcare systems.  Past medical history, social, surgical and family history all reviewed in electronic medical record.  No pertanent information unless stated regarding to the chief complaint.   Past Medical History:  Diagnosis Date   Anxiety    Allergy induced asthma   Arthritis    Asthma    Crohn disease ([place])    Depression    GERD (gastroesophageal reflux disease)    History of methicillin resistant staphylococcus aureus (MRSA)    Sleep apnea    CPAP    Allergies  Allergen Reactions   Chlorhexidine Gluconate Itching   Hydrocodone Itching     Review of Systems:  No headache, visual changes, nausea, vomiting, diarrhea, constipation, dizziness, abdominal pain, skin rash, fevers, chills, night sweats, weight loss, swollen lymph nodes, body aches, joint swelling, chest pain, shortness of breath, mood changes. POSITIVE muscle aches  Objective  Blood  pressure 120/74, pulse 86, height 5' 2"  (1.575 m), weight 149 lb (67.6 kg), last menstrual period 01/10/2014, SpO2 99 %.   General: No apparent distress alert and oriented x3 mood and affect normal, dressed appropriately.  HEENT: Pupils equal, extraocular movements intact  Respiratory: Patient's speak in full sentences and does not appear short of breath  Cardiovascular: No lower extremity edema, non tender, no erythema    Osteopathic findings   T9 extended rotated and side bent left L2 flexed rotated and side bent right L5 flexed rotated and side bent left Sacrum right on right       Assessment and Plan:  Lumbar radiculopathy Patient likely is not having any radicular symptoms.  Patient does still have tightness noted.  We discussed with patient about icing regimen.  Discussed home exercises and continuing with the core strengthening.  Discussed which activities to do which wants to avoid.  Increase activity slowly.  Follow-up again in 6 to 8 weeks.   Nonallopathic problems  Decision today to treat with OMT was based on Physical Exam  After verbal consent patient was treated with HVLA, ME, FPR techniques in  thoracic, lumbar, and sacral  areas  Patient tolerated the procedure well with improvement in symptoms  Patient given exercises, stretches and lifestyle modifications  See medications in patient instructions if given  Patient will follow up in 4-8 weeks      The above documentation has been reviewed and is accurate and complete Lyndal Pulley, DO        Note: This  dictation was prepared with Dragon dictation along with smaller phrase technology. Any transcriptional errors that result from this process are unintentional.

## 2020-10-19 ENCOUNTER — Other Ambulatory Visit: Payer: Self-pay

## 2020-10-19 ENCOUNTER — Encounter: Payer: Self-pay | Admitting: Family Medicine

## 2020-10-19 ENCOUNTER — Ambulatory Visit (INDEPENDENT_AMBULATORY_CARE_PROVIDER_SITE_OTHER): Payer: 59 | Admitting: Family Medicine

## 2020-10-19 VITALS — BP 120/74 | HR 86 | Ht 62.0 in | Wt 149.0 lb

## 2020-10-19 DIAGNOSIS — M5416 Radiculopathy, lumbar region: Secondary | ICD-10-CM | POA: Diagnosis not present

## 2020-10-19 DIAGNOSIS — M9903 Segmental and somatic dysfunction of lumbar region: Secondary | ICD-10-CM | POA: Diagnosis not present

## 2020-10-19 DIAGNOSIS — M9902 Segmental and somatic dysfunction of thoracic region: Secondary | ICD-10-CM | POA: Diagnosis not present

## 2020-10-19 DIAGNOSIS — M9904 Segmental and somatic dysfunction of sacral region: Secondary | ICD-10-CM | POA: Diagnosis not present

## 2020-10-19 NOTE — Assessment & Plan Note (Signed)
Patient likely is not having any radicular symptoms.  Patient does still have tightness noted.  We discussed with patient about icing regimen.  Discussed home exercises and continuing with the core strengthening.  Discussed which activities to do which wants to avoid.  Increase activity slowly.  Follow-up again in 6 to 8 weeks.

## 2020-10-19 NOTE — Patient Instructions (Signed)
Deep breaths Enjoy the mountains Keep beating up on people on the tennis court See you again in 6 weeks

## 2020-10-23 ENCOUNTER — Other Ambulatory Visit (HOSPITAL_COMMUNITY): Payer: Self-pay

## 2020-11-01 ENCOUNTER — Other Ambulatory Visit (HOSPITAL_COMMUNITY): Payer: Self-pay

## 2020-11-03 ENCOUNTER — Other Ambulatory Visit (HOSPITAL_COMMUNITY): Payer: Self-pay

## 2020-11-06 ENCOUNTER — Encounter: Payer: Self-pay | Admitting: Internal Medicine

## 2020-11-06 DIAGNOSIS — Z1231 Encounter for screening mammogram for malignant neoplasm of breast: Secondary | ICD-10-CM | POA: Diagnosis not present

## 2020-11-09 ENCOUNTER — Other Ambulatory Visit (HOSPITAL_COMMUNITY): Payer: Self-pay

## 2020-11-09 ENCOUNTER — Other Ambulatory Visit: Payer: Self-pay

## 2020-11-09 ENCOUNTER — Other Ambulatory Visit: Payer: Self-pay | Admitting: Internal Medicine

## 2020-11-09 MED ORDER — ESTRADIOL-NORETHINDRONE ACET 1-0.5 MG PO TABS
1.0000 | ORAL_TABLET | Freq: Every day | ORAL | 0 refills | Status: DC
  Filled 2020-11-09: qty 28, 28d supply, fill #0

## 2020-11-09 MED ORDER — DULOXETINE HCL 60 MG PO CPEP
60.0000 mg | ORAL_CAPSULE | Freq: Every day | ORAL | 1 refills | Status: DC
  Filled 2020-11-09: qty 90, 90d supply, fill #0
  Filled 2021-02-12: qty 90, 90d supply, fill #1

## 2020-11-10 ENCOUNTER — Telehealth: Payer: Self-pay | Admitting: Internal Medicine

## 2020-11-10 NOTE — Telephone Encounter (Signed)
Called and let patient know that she would need to see dermatologist and she verbalized understanding.

## 2020-11-10 NOTE — Telephone Encounter (Signed)
[name] [phone]  [name] called to say she has the spots on her face that she has been treating for the last month with an antibiotic ointment, (nutrobion), it will clear up for a little bit then they all come back with more.

## 2020-11-14 ENCOUNTER — Other Ambulatory Visit (HOSPITAL_COMMUNITY): Payer: Self-pay

## 2020-11-23 ENCOUNTER — Other Ambulatory Visit (HOSPITAL_COMMUNITY): Payer: Self-pay

## 2020-11-30 ENCOUNTER — Ambulatory Visit: Payer: 59 | Admitting: Family Medicine

## 2020-12-05 ENCOUNTER — Telehealth (HOSPITAL_BASED_OUTPATIENT_CLINIC_OR_DEPARTMENT_OTHER): Payer: Self-pay | Admitting: Nurse Practitioner

## 2020-12-05 ENCOUNTER — Other Ambulatory Visit (HOSPITAL_COMMUNITY): Payer: Self-pay

## 2020-12-05 MED ORDER — MUPIROCIN 2 % EX OINT
1.0000 "application " | TOPICAL_OINTMENT | Freq: Two times a day (BID) | CUTANEOUS | 3 refills | Status: DC
  Filled 2020-12-05: qty 22, 11d supply, fill #0
  Filled 2021-05-10: qty 22, 11d supply, fill #1
  Filled 2021-10-20: qty 22, 11d supply, fill #2

## 2020-12-05 NOTE — Telephone Encounter (Signed)
Patient contacted me with concern for an open sore on her check that has not responded to doxycycline. I saw her a few days ago and we discussed this area. She has had this occur in the past and had success with mupirocin ointment. I will send her a new prescription. Advised her to call back if no improvement and would recommend dermatology appointment if no improvement.

## 2020-12-06 DIAGNOSIS — G4733 Obstructive sleep apnea (adult) (pediatric): Secondary | ICD-10-CM | POA: Diagnosis not present

## 2020-12-11 ENCOUNTER — Other Ambulatory Visit (HOSPITAL_COMMUNITY): Payer: Self-pay

## 2020-12-11 ENCOUNTER — Other Ambulatory Visit: Payer: Self-pay | Admitting: Family Medicine

## 2020-12-11 MED FILL — Celecoxib Cap 100 MG: ORAL | 30 days supply | Qty: 60 | Fill #3 | Status: AC

## 2020-12-12 ENCOUNTER — Other Ambulatory Visit (HOSPITAL_COMMUNITY): Payer: Self-pay

## 2020-12-12 DIAGNOSIS — H10413 Chronic giant papillary conjunctivitis, bilateral: Secondary | ICD-10-CM | POA: Diagnosis not present

## 2020-12-12 DIAGNOSIS — H2513 Age-related nuclear cataract, bilateral: Secondary | ICD-10-CM | POA: Diagnosis not present

## 2020-12-12 DIAGNOSIS — H16423 Pannus (corneal), bilateral: Secondary | ICD-10-CM | POA: Diagnosis not present

## 2020-12-12 DIAGNOSIS — H40013 Open angle with borderline findings, low risk, bilateral: Secondary | ICD-10-CM | POA: Diagnosis not present

## 2020-12-12 DIAGNOSIS — H5203 Hypermetropia, bilateral: Secondary | ICD-10-CM | POA: Diagnosis not present

## 2020-12-12 MED ORDER — VITAMIN D (ERGOCALCIFEROL) 1.25 MG (50000 UNIT) PO CAPS
50000.0000 [IU] | ORAL_CAPSULE | ORAL | 0 refills | Status: DC
  Filled 2020-12-12: qty 12, 84d supply, fill #0

## 2020-12-12 MED ORDER — ESTRADIOL-NORETHINDRONE ACET 1-0.5 MG PO TABS
1.0000 | ORAL_TABLET | Freq: Every day | ORAL | 0 refills | Status: DC
  Filled 2020-12-12: qty 28, 28d supply, fill #0

## 2020-12-18 ENCOUNTER — Other Ambulatory Visit (HOSPITAL_COMMUNITY): Payer: Self-pay

## 2020-12-18 DIAGNOSIS — Z01419 Encounter for gynecological examination (general) (routine) without abnormal findings: Secondary | ICD-10-CM | POA: Diagnosis not present

## 2020-12-18 DIAGNOSIS — Z01411 Encounter for gynecological examination (general) (routine) with abnormal findings: Secondary | ICD-10-CM | POA: Diagnosis not present

## 2020-12-18 DIAGNOSIS — Z124 Encounter for screening for malignant neoplasm of cervix: Secondary | ICD-10-CM | POA: Diagnosis not present

## 2020-12-18 DIAGNOSIS — Z6827 Body mass index (BMI) 27.0-27.9, adult: Secondary | ICD-10-CM | POA: Diagnosis not present

## 2020-12-18 DIAGNOSIS — Z79890 Hormone replacement therapy (postmenopausal): Secondary | ICD-10-CM | POA: Diagnosis not present

## 2020-12-18 DIAGNOSIS — Z113 Encounter for screening for infections with a predominantly sexual mode of transmission: Secondary | ICD-10-CM | POA: Diagnosis not present

## 2020-12-18 DIAGNOSIS — E2839 Other primary ovarian failure: Secondary | ICD-10-CM | POA: Diagnosis not present

## 2020-12-18 MED ORDER — ESTRADIOL 0.05 MG/24HR TD PTTW
MEDICATED_PATCH | TRANSDERMAL | 3 refills | Status: DC
  Filled 2020-12-18 – 2021-01-09 (×2): qty 24, 84d supply, fill #0
  Filled 2021-04-15: qty 24, 84d supply, fill #1
  Filled 2021-04-23 – 2021-07-31 (×2): qty 24, 84d supply, fill #2
  Filled 2021-10-20: qty 24, 84d supply, fill #3

## 2020-12-18 MED ORDER — PROGESTERONE MICRONIZED 100 MG PO CAPS
100.0000 mg | ORAL_CAPSULE | Freq: Every day | ORAL | 3 refills | Status: DC
  Filled 2020-12-18 – 2021-01-09 (×2): qty 90, 90d supply, fill #0
  Filled 2021-04-15: qty 90, 90d supply, fill #1
  Filled 2021-07-20: qty 90, 90d supply, fill #2
  Filled 2021-10-20: qty 90, 90d supply, fill #3

## 2020-12-19 ENCOUNTER — Other Ambulatory Visit (HOSPITAL_COMMUNITY): Payer: Self-pay

## 2020-12-21 ENCOUNTER — Other Ambulatory Visit (HOSPITAL_COMMUNITY): Payer: Self-pay

## 2020-12-26 ENCOUNTER — Other Ambulatory Visit (HOSPITAL_COMMUNITY): Payer: Self-pay

## 2021-01-01 NOTE — Progress Notes (Signed)
[name] [place] [address] [place] Phone: [phone] Subjective:   [name], am serving as a scribe for Dr. Hulan [name]. This visit occurred during the SARS-CoV-2 public health emergency.  Safety protocols were in place, including screening questions prior to the visit, additional usage of staff PPE, and extensive cleaning of exam room while observing appropriate contact time as indicated for disinfecting solutions.   I'm seeing this patient by the request  of:  [name], Stacey Lick, MD  CC: Neck and back pain follow  [id number]  [name] is a 59 y.o. female coming in with complaint of back and neck pain. OMT on 10/19/2020. Patient states LBP is irritating. No new complaints.  Medications patient has been prescribed: Vit D          Reviewed prior external information including notes and imaging from previsou exam, outside providers and external EMR if available.   As well as notes that were available from care everywhere and other healthcare systems.  Past medical history, social, surgical and family history all reviewed in electronic medical record.  No pertanent information unless stated regarding to the chief complaint.   Past Medical History:  Diagnosis Date   Anxiety    Allergy induced asthma   Arthritis    Asthma    Crohn disease ([place])    Depression    GERD (gastroesophageal reflux disease)    History of methicillin resistant staphylococcus aureus (MRSA)    Sleep apnea    CPAP    Allergies  Allergen Reactions   Chlorhexidine Gluconate Itching   Hydrocodone Itching     Review of Systems:  No headache, visual changes, nausea, vomiting, diarrhea, constipation, dizziness, abdominal pain, skin rash, fevers, chills, night sweats, weight loss, swollen lymph nodes, body aches, joint swelling, chest pain, shortness of breath, mood changes. POSITIVE muscle aches  Objective  Blood pressure 124/62, pulse 84,  height 5\' 2"  (1.575 m), weight 152 lb (68.9 kg), last menstrual period 01/10/2014, SpO2 98 %.   General: No apparent distress alert and oriented x3 mood and affect normal, dressed appropriately.  HEENT: Pupils equal, extraocular movements intact  Respiratory: Patient's speak in full sentences and does not appear short of breath  Cardiovascular: No lower extremity edema, non tender, no erythema  Neck exam does have some mild loss of lordosis.  Some tenderness to palpation of the paraspinal musculature. Back exam does show more tightness in around the right sacroiliac joint.  Tightness of the paraspinal musculature of the lumbar spine also noted all the way to the thoracolumbar juncture.  Osteopathic findings  C3 flexed rotated and side bent right C7 flexed rotated and side bent left T3 extended rotated and side bent right inhaled rib T9 extended rotated and side bent left L1 flexed rotated and side bent right Sacrum right on right       Assessment and Plan:  Lumbar radiculopathy Likely no significant radicular symptoms.  Does have significant tightness of the muscles low.  Discussed posture dynamics, discussed which activities to do which wants to avoid.  Follow-up again in 6 to 8 weeks.   Nonallopathic problems  Decision today to treat with OMT was based on Physical Exam  After verbal consent patient was treated with HVLA, ME, FPR techniques in cervical, rib, thoracic, lumbar, and sacral  areas  Patient tolerated the procedure well with improvement in symptoms  Patient given exercises, stretches and lifestyle modifications  See medications in patient instructions  if given  Patient will follow up in 4-8 weeks     The above documentation has been reviewed and is accurate and complete Stacey Pulley, DO        Note: This dictation was prepared with Dragon dictation along with smaller phrase technology. Any transcriptional errors that result from this process are  unintentional.

## 2021-01-02 ENCOUNTER — Other Ambulatory Visit (HOSPITAL_COMMUNITY): Payer: Self-pay

## 2021-01-02 ENCOUNTER — Ambulatory Visit (INDEPENDENT_AMBULATORY_CARE_PROVIDER_SITE_OTHER): Payer: 59 | Admitting: Family Medicine

## 2021-01-02 ENCOUNTER — Other Ambulatory Visit: Payer: Self-pay

## 2021-01-02 VITALS — BP 124/62 | HR 84 | Ht 62.0 in | Wt 152.0 lb

## 2021-01-02 DIAGNOSIS — M9908 Segmental and somatic dysfunction of rib cage: Secondary | ICD-10-CM

## 2021-01-02 DIAGNOSIS — M9902 Segmental and somatic dysfunction of thoracic region: Secondary | ICD-10-CM | POA: Diagnosis not present

## 2021-01-02 DIAGNOSIS — M9903 Segmental and somatic dysfunction of lumbar region: Secondary | ICD-10-CM

## 2021-01-02 DIAGNOSIS — M9904 Segmental and somatic dysfunction of sacral region: Secondary | ICD-10-CM

## 2021-01-02 DIAGNOSIS — M5416 Radiculopathy, lumbar region: Secondary | ICD-10-CM | POA: Diagnosis not present

## 2021-01-02 DIAGNOSIS — M9901 Segmental and somatic dysfunction of cervical region: Secondary | ICD-10-CM

## 2021-01-02 NOTE — Assessment & Plan Note (Signed)
Likely no significant radicular symptoms.  Does have significant tightness of the muscles low.  Discussed posture dynamics, discussed which activities to do which wants to avoid.  Follow-up again in 6 to 8 weeks.

## 2021-01-02 NOTE — Patient Instructions (Signed)
Good to see you! Thanks for the wonderful gift Happy Holidays See you again in 5-6 weeks

## 2021-01-09 ENCOUNTER — Other Ambulatory Visit (HOSPITAL_COMMUNITY): Payer: Self-pay

## 2021-01-09 MED FILL — Celecoxib Cap 100 MG: ORAL | 30 days supply | Qty: 60 | Fill #4 | Status: AC

## 2021-01-11 ENCOUNTER — Other Ambulatory Visit (HOSPITAL_COMMUNITY): Payer: Self-pay

## 2021-01-17 ENCOUNTER — Other Ambulatory Visit (HOSPITAL_COMMUNITY): Payer: Self-pay

## 2021-01-22 ENCOUNTER — Other Ambulatory Visit (HOSPITAL_COMMUNITY): Payer: Self-pay

## 2021-01-25 ENCOUNTER — Other Ambulatory Visit (HOSPITAL_COMMUNITY): Payer: Self-pay

## 2021-01-29 ENCOUNTER — Other Ambulatory Visit: Payer: Self-pay

## 2021-01-29 ENCOUNTER — Other Ambulatory Visit (HOSPITAL_COMMUNITY): Payer: Self-pay

## 2021-01-29 MED ORDER — ALBUTEROL SULFATE HFA 108 (90 BASE) MCG/ACT IN AERS
2.0000 | INHALATION_SPRAY | Freq: Four times a day (QID) | RESPIRATORY_TRACT | 3 refills | Status: DC | PRN
  Filled 2021-01-29: qty 8.5, 25d supply, fill #0

## 2021-01-29 NOTE — Progress Notes (Signed)
Per Dr. Johnsie Cancel, okay to order albuterol inhaler.

## 2021-01-30 ENCOUNTER — Other Ambulatory Visit (HOSPITAL_COMMUNITY): Payer: Self-pay

## 2021-01-31 ENCOUNTER — Other Ambulatory Visit (HOSPITAL_COMMUNITY): Payer: Self-pay

## 2021-02-01 NOTE — Progress Notes (Signed)
[place] Phone: [phone] Subjective:   Stacey [name], am serving as a scribe for Dr. Hulan Saas.This visit occurred during the SARS-CoV-2 public health emergency.  Safety protocols were in place, including screening questions prior to the visit, additional usage of staff PPE, and extensive cleaning of exam room while observing appropriate contact time as indicated for disinfecting solutions.  I'm seeing this patient by the request  of:  [name], Cresenciano Lick, MD  CC: neck and back pain   [id number]  Stacey [name] is a 61 y.o. female coming in with complaint of back and neck pain. OMT on 01/02/2021. Patient states that she is having B hip and cervical spine pain.  Patient has had a cough recently and thinks that that has exacerbated some of the problem as well.  [facility]  Medications patient has been prescribed: Vit D  Taking:         Reviewed prior external information including notes and imaging from previsou exam, outside providers and external EMR if available.   As well as notes that were available from care everywhere and other healthcare systems.  Past medical history, social, surgical and family history all reviewed in electronic medical record.  [name] pertanent information unless stated regarding to the chief complaint.   Past Medical History:  Diagnosis Date   Anxiety    Allergy induced asthma   Arthritis    Asthma    Crohn disease ([place])    Depression    GERD (gastroesophageal reflux disease)    History of methicillin resistant staphylococcus aureus (MRSA)    Sleep apnea    CPAP    Allergies  Allergen Reactions   Chlorhexidine Gluconate Itching   Hydrocodone Itching     Review of Systems:  [name] headache, visual changes, nausea, vomiting, diarrhea, constipation, dizziness, abdominal pain, skin rash, fevers, chills, night sweats, weight loss, swollen lymph nodes, body aches,  joint swelling, chest pain, shortness of breath, mood changes. POSITIVE muscle aches  Objective  Blood pressure 124/84, pulse 92, height 5\' 2"  (1.575 m), weight 151 lb (68.5 kg), last menstrual period 01/10/2014, SpO2 97 %.   General: [name] apparent distress alert and oriented x3 mood and affect normal, dressed appropriately.  HEENT: Pupils equal, extraocular movements intact  Respiratory: Patient's speak in full sentences and does not appear short of breath  Cardiovascular: [name] lower extremity edema, non tender, [name] erythema  Patient is coughing at this time.  Tightness noted.  This is more in the paraspinal musculature of the lumbar spine and thoracolumbar juncture. Patient's foot exam still shows the  Transverse arch.  Mild bunion and bunionette formation noted.  Some very mild tenderness noted of these bunions.  [name] significant progression from last time.  Discussed with patient bone.  Osteopathic findings  C3 flexed rotated and side bent right C7 flexed rotated and side bent left T3 extended rotated and side bent right inhaled rib T9 extended rotated and side bent left L2 flexed rotated and side bent right Sacrum right on right       Assessment and Plan:  Loss of transverse plantar arch Chronic, with mild exacerbation discussed with patient go back to the orthotics.  Discussed home exercises follow-up in 6 weeks  Lumbar radiculopathy Chronic problem with exacerbation.  And some increase in tightness of the thoracolumbar juncture that is likely secondary to patient's not being quite as active.  Discussed with patient about which activities to  do which wants to avoid.  Patient likely when the cough seems to subside will do better.  Follow-up again in 4 to 6 weeks.    Nonallopathic problems  Decision today to treat with OMT was based on Physical Exam  After verbal consent patient was treated with HVLA, ME, FPR techniques in cervical, rib, thoracic, lumbar, and sacral   areas  Patient tolerated the procedure well with improvement in symptoms  Patient given exercises, stretches and lifestyle modifications  See medications in patient instructions if given  Patient will follow up in 4-8 weeks      The above documentation has been reviewed and is accurate and complete Lyndal Pulley, DO        Note: This dictation was prepared with Dragon dictation along with smaller phrase technology. Any transcriptional errors that result from this process are unintentional.

## 2021-02-05 ENCOUNTER — Other Ambulatory Visit: Payer: Self-pay

## 2021-02-05 ENCOUNTER — Ambulatory Visit (INDEPENDENT_AMBULATORY_CARE_PROVIDER_SITE_OTHER): Payer: 59 | Admitting: Podiatry

## 2021-02-05 DIAGNOSIS — L603 Nail dystrophy: Secondary | ICD-10-CM

## 2021-02-05 DIAGNOSIS — B351 Tinea unguium: Secondary | ICD-10-CM

## 2021-02-05 DIAGNOSIS — L608 Other nail disorders: Secondary | ICD-10-CM | POA: Diagnosis not present

## 2021-02-06 ENCOUNTER — Ambulatory Visit (INDEPENDENT_AMBULATORY_CARE_PROVIDER_SITE_OTHER): Payer: 59 | Admitting: Family Medicine

## 2021-02-06 ENCOUNTER — Other Ambulatory Visit (HOSPITAL_COMMUNITY): Payer: Self-pay

## 2021-02-06 ENCOUNTER — Other Ambulatory Visit: Payer: Self-pay

## 2021-02-06 VITALS — BP 124/84 | HR 92 | Ht 62.0 in | Wt 151.0 lb

## 2021-02-06 DIAGNOSIS — M9904 Segmental and somatic dysfunction of sacral region: Secondary | ICD-10-CM

## 2021-02-06 DIAGNOSIS — M9902 Segmental and somatic dysfunction of thoracic region: Secondary | ICD-10-CM | POA: Diagnosis not present

## 2021-02-06 DIAGNOSIS — M9908 Segmental and somatic dysfunction of rib cage: Secondary | ICD-10-CM | POA: Diagnosis not present

## 2021-02-06 DIAGNOSIS — M216X9 Other acquired deformities of unspecified foot: Secondary | ICD-10-CM

## 2021-02-06 DIAGNOSIS — M5416 Radiculopathy, lumbar region: Secondary | ICD-10-CM | POA: Diagnosis not present

## 2021-02-06 DIAGNOSIS — M9903 Segmental and somatic dysfunction of lumbar region: Secondary | ICD-10-CM | POA: Diagnosis not present

## 2021-02-06 DIAGNOSIS — M9901 Segmental and somatic dysfunction of cervical region: Secondary | ICD-10-CM

## 2021-02-06 NOTE — Patient Instructions (Signed)
Use orthotics in shoes Teaspoon room temperature honey before bed or rye whiskey See me in 4-6 weeks

## 2021-02-06 NOTE — Assessment & Plan Note (Signed)
Chronic problem with exacerbation.  And some increase in tightness of the thoracolumbar juncture that is likely secondary to patient's not being quite as active.  Discussed with patient about which activities to do which wants to avoid.  Patient likely when the cough seems to subside will do better.  Follow-up again in 4 to 6 weeks.

## 2021-02-06 NOTE — Assessment & Plan Note (Signed)
Chronic, with mild exacerbation discussed with patient go back to the orthotics.  Discussed home exercises follow-up in 6 weeks

## 2021-02-08 NOTE — Progress Notes (Signed)
Subjective:   Patient ID: [name], female   DOB: 61 y.o.   MRN: [id number]   HPI 61 year old female presents the office today onset of her right second digit toenail becoming thickened discolored.  She states this started about 25 years ago.  She states at times it does cause discomfort.  She tried topical antifungals, Jublia as well as vinegar and topical Lamisil without any success.  Denies any swelling or redness or any drainage.  She has no other concerns today.   Review of Systems  All other systems reviewed and are negative.  Past Medical History:  Diagnosis Date   Anxiety    Allergy induced asthma   Arthritis    Asthma    Crohn disease ([place])    Depression    GERD (gastroesophageal reflux disease)    History of methicillin resistant staphylococcus aureus (MRSA)    Sleep apnea    CPAP    Past Surgical History:  Procedure Laterality Date   ANAL FISSURE REPAIR  2004   CESAREAN SECTION     INSERTION OF MESH N/A 07/26/2020   Procedure: INSERTION OF MESH;  Surgeon: Ralene Ok, MD;  Location: [facility];  Service: General;  Laterality: N/A;   LIGAMENT REPAIR Right 11/23/2015   Procedure: right index radial collateral LIGAMENT REPAIR;  Surgeon: Leanora Cover, MD;  Location: [facility];  Service: Orthopedics;  Laterality: Right;   VENTRAL HERNIA REPAIR N/A 07/26/2020   Procedure: LAPAROSCOPIC VENTRAL HERNIA REPAIR WITH MESH;  Surgeon: Ralene Ok, MD;  Location: [facility];  Service: General;  Laterality: N/A;     Current Outpatient Medications:    acetaminophen (TYLENOL) 500 MG tablet, Take 500-1,000 mg by mouth every 6 (six) hours as needed (pain)., Disp: , Rfl:    Adalimumab (HUMIRA) 40 MG/0.4ML PSKT, INJECT 40MG  UNDER THE SKIN EVERY 14 DAYS., Disp: 2 each, Rfl: 6   albuterol (VENTOLIN HFA) 108 (90 Base) MCG/ACT inhaler, Inhale 2 puffs into the lungs every 6 (six) hours as needed for wheezing or shortness of breath., Disp: 8.5 g, Rfl: 3   bimatoprost  (LUMIGAN) 0.01 % SOLN, Instill 1 drop into both eyes at bedtime, Disp: 2.5 mL, Rfl: 6   busPIRone (BUSPAR) 7.5 MG tablet, Take 1 tablet (7.5 mg total) by mouth 2 (two) times daily., Disp: 180 tablet, Rfl: 2   celecoxib (CELEBREX) 100 MG capsule, TAKE 1 CAPSULE (100 MG TOTAL) BY MOUTH IN THE MORNING AND AT BEDTIME. (Patient taking differently: Take 100 mg by mouth in the morning.), Disp: 60 capsule, Rfl: 5   cetirizine (ZYRTEC) 10 MG tablet, Take 10 mg by mouth in the morning., Disp: , Rfl:    DULoxetine (CYMBALTA) 60 MG capsule, Take 1 capsule (60 mg total) by mouth daily., Disp: 90 capsule, Rfl: 1   estradiol (VIVELLE-DOT) 0.05 MG/24HR patch, Apply 1 patch twice a week by transdermal route, Disp: 26 patch, Rfl: 3   estradiol-norethindrone (ACTIVELLA) 1-0.5 MG tablet, TAKE 1 TABLET BY MOUTH DAILY (Patient taking differently: Take 1 tablet by mouth in the morning.), Disp: 84 tablet, Rfl: 4   estradiol-norethindrone (ACTIVELLA) 1-0.5 MG tablet, Take 1 tablet by mouth daily., Disp: 28 tablet, Rfl: 0   famotidine (PEPCID) 20 MG tablet, Take 20 mg by mouth in the morning., Disp: , Rfl:    fluocinonide ointment (LIDEX) 0.73 %, Apply 1 application topically daily as needed (irritation)., Disp: , Rfl:    LUMIGAN 0.01 % SOLN, Place 1 drop into both eyes at bedtime., Disp: ,  Rfl: 11   mupirocin ointment (BACTROBAN) 2 %, Apply 1 application topically 2 (two) times daily., Disp: 22 g, Rfl: 3   progesterone (PROMETRIUM) 100 MG capsule, Take 1 capsule (100 mg total) by mouth daily., Disp: 90 capsule, Rfl: 3   traMADol (ULTRAM) 50 MG tablet, Take 1 tablet (50 mg total) by mouth every 6 (six) hours as needed., Disp: 20 tablet, Rfl: 0   Vitamin D, Ergocalciferol, (DRISDOL) 1.25 MG (50000 UNIT) CAPS capsule, Take 1 capsule (50,000 Units total) by mouth every 7 (seven) days., Disp: 12 capsule, Rfl: 0  Allergies  Allergen Reactions   Chlorhexidine Gluconate Itching   Hydrocodone Itching          Objective:   Physical Exam  General: AAO x3, NAD  Dermatological: The right second digit toenails hypertrophic, dystrophic with yellow, brown discoloration.  No hyperpigmented changes.  No edema, erythema or any signs of infection.  There is no open lesions.  Vascular: Dorsalis Pedis artery and Posterior Tibial artery pedal pulses are 2/4 bilateral with immedate capillary fill time. There is no pain with calf compression, swelling, warmth, erythema.   Neruologic: Grossly intact via light touch bilateral.   Musculoskeletal: No significant tenderness on exam today.  Muscular strength 5/5 in all groups tested bilateral.  Gait: Unassisted, Nonantalgic.       Assessment:   Right second digit onychomycosis, onychodystrophy     Plan:  -Treatment options discussed including all alternatives, risks, and complications -Etiology of symptoms were discussed -We discussed treatment options for the nail.  I do not think that topical medication will be very helpful given the thickening of the nail.  Had sharp injury to the nail without any complications I sent this for culture, pathology to [facility].  We will await the results of the culture before proceeding with further treatment.  She agrees with this plan. -We did discuss nail removal today as well.  [name] DPM

## 2021-02-12 ENCOUNTER — Other Ambulatory Visit (HOSPITAL_COMMUNITY): Payer: Self-pay

## 2021-02-19 ENCOUNTER — Other Ambulatory Visit (HOSPITAL_COMMUNITY): Payer: Self-pay

## 2021-02-23 ENCOUNTER — Other Ambulatory Visit: Payer: Self-pay | Admitting: Internal Medicine

## 2021-02-23 ENCOUNTER — Other Ambulatory Visit (HOSPITAL_COMMUNITY): Payer: Self-pay

## 2021-02-23 MED ORDER — CELECOXIB 100 MG PO CAPS
100.0000 mg | ORAL_CAPSULE | Freq: Two times a day (BID) | ORAL | 11 refills | Status: DC
  Filled 2021-02-23: qty 60, 30d supply, fill #0
  Filled 2021-05-18: qty 60, 30d supply, fill #1
  Filled 2021-08-01: qty 60, 30d supply, fill #2
  Filled 2021-09-20: qty 60, 30d supply, fill #3
  Filled 2021-10-20: qty 25, 13d supply, fill #4
  Filled 2021-10-22: qty 35, 17d supply, fill #4

## 2021-02-26 ENCOUNTER — Ambulatory Visit: Payer: 59 | Admitting: Internal Medicine

## 2021-02-26 ENCOUNTER — Telehealth: Payer: Self-pay

## 2021-02-26 ENCOUNTER — Other Ambulatory Visit (HOSPITAL_COMMUNITY): Payer: Self-pay

## 2021-02-26 ENCOUNTER — Encounter: Payer: Self-pay | Admitting: Podiatry

## 2021-02-26 ENCOUNTER — Other Ambulatory Visit: Payer: Self-pay

## 2021-02-26 ENCOUNTER — Encounter: Payer: Self-pay | Admitting: Internal Medicine

## 2021-02-26 VITALS — BP 114/68 | HR 73 | Temp 99.1°F

## 2021-02-26 DIAGNOSIS — H7291 Unspecified perforation of tympanic membrane, right ear: Secondary | ICD-10-CM

## 2021-02-26 DIAGNOSIS — R059 Cough, unspecified: Secondary | ICD-10-CM | POA: Diagnosis not present

## 2021-02-26 DIAGNOSIS — H6691 Otitis media, unspecified, right ear: Secondary | ICD-10-CM

## 2021-02-26 DIAGNOSIS — R0981 Nasal congestion: Secondary | ICD-10-CM | POA: Diagnosis not present

## 2021-02-26 DIAGNOSIS — J069 Acute upper respiratory infection, unspecified: Secondary | ICD-10-CM

## 2021-02-26 DIAGNOSIS — Z79620 Long term (current) use of immunosuppressive biologic: Secondary | ICD-10-CM

## 2021-02-26 DIAGNOSIS — J040 Acute laryngitis: Secondary | ICD-10-CM | POA: Diagnosis not present

## 2021-02-26 DIAGNOSIS — Z8719 Personal history of other diseases of the digestive system: Secondary | ICD-10-CM | POA: Diagnosis not present

## 2021-02-26 LAB — POCT INFLUENZA A/B
Influenza A, POC: NEGATIVE
Influenza B, POC: NEGATIVE

## 2021-02-26 LAB — POCT RAPID STREP A (OFFICE): Rapid Strep A Screen: NEGATIVE

## 2021-02-26 MED ORDER — FLUCONAZOLE 150 MG PO TABS
150.0000 mg | ORAL_TABLET | Freq: Once | ORAL | 0 refills | Status: AC
  Filled 2021-02-26: qty 1, 1d supply, fill #0

## 2021-02-26 MED ORDER — CEFTRIAXONE SODIUM 1 G IJ SOLR
1.0000 g | Freq: Once | INTRAMUSCULAR | Status: AC
  Administered 2021-02-26: 1 g via INTRAMUSCULAR

## 2021-02-26 MED ORDER — LEVOFLOXACIN 500 MG PO TABS
500.0000 mg | ORAL_TABLET | Freq: Every day | ORAL | 0 refills | Status: AC
  Filled 2021-02-26: qty 7, 7d supply, fill #0

## 2021-02-26 NOTE — Patient Instructions (Addendum)
Rocephin 1 g IM given in office.  Respiratory virus panel obtained.  Patient is to start Levaquin 500 mg daily for 7 days.  Will be referred to ENT regarding possible right TM perforation.  Rest and drink fluids.  Influenza and strep tests are negative.  CBC with differential is pending.  May take Diflucan if needed for Candida vaginitis.

## 2021-02-26 NOTE — Progress Notes (Signed)
° °  Subjective:    Patient ID: [name], female    DOB: 1960-07-28, 61 y.o.   MRN: [id number]  HPI 61 year old Female with history of Crohn's disease followed at [facility] treated with DMARD. Had abdominal wall henia repaired in July.  Traveled to [place] last weekend with friends. One of her friends subsequently developed respiratory infection symptoms. Patient became ill over the past couple of days with hoarseness, cough, congestion and right ear discomfort. Actually saw drainage from right ear on pillow.  Patient has tested negative for Covid-19.    Review of Systems she sounds hoarse when she speaks. Complains of ear congestion. No documented fever.     Objective:   Physical Exam  T 99.1 degrees BP 114/68. Pulse ox 99%. RR normal. She is hoarse when she speaks.  Her pharynx is slightly injected.  No exudate noted.  Right TM may have a perforation.  There may be some debris in the right external ear canal obscuring adequate vision of the TM.  Left TM is full.  Her chest is clear to auscultation.  Rapid flu test is negative, rapid strep test is negative.  Respiratory virus panel obtained.  COVID test from home was negative.      Assessment & Plan:   Acute laryngitis  Possible right TM perforation  Acute upper respiratory infection  History of DMARD therapy for Crohn's disease (Humira)  History of anxiety and depression  History of abdominal wall hernia repair  Plan: She was given 1 g IM Rocephin here in the office.  CBC with differential drawn and results are pending.  She will be treated with Levaquin 500 mg daily for 7 days.  Will be referred to ENT for evaluation of right ear.  Rest at home.  Monitor pulse oximetry.  Stay well-hydrated with fluids.  Rapid influenza and strep test are negative.  Respiratory virus panel is pending.

## 2021-02-26 NOTE — Telephone Encounter (Signed)
scheduled

## 2021-02-26 NOTE — Telephone Encounter (Signed)
Patient called and sounds horrible. She is hoarse, head congestion, ears stopped up, cough. No fever. Patient has been advised to take a home covid test and let us know results so we can decide how to treat/see. She will call back with results.

## 2021-02-26 NOTE — Telephone Encounter (Signed)
Home covid is negative

## 2021-03-01 ENCOUNTER — Other Ambulatory Visit (HOSPITAL_COMMUNITY): Payer: Self-pay

## 2021-03-01 DIAGNOSIS — F411 Generalized anxiety disorder: Secondary | ICD-10-CM | POA: Diagnosis not present

## 2021-03-01 LAB — CBC WITH DIFFERENTIAL/PLATELET
Absolute Monocytes: 837 cells/uL (ref 200–950)
Basophils Absolute: 113 cells/uL (ref 0–200)
Basophils Relative: 1.2 %
Eosinophils Absolute: 367 cells/uL (ref 15–500)
Eosinophils Relative: 3.9 %
HCT: 38.4 % (ref 35.0–45.0)
Hemoglobin: 13 g/dL (ref 11.7–15.5)
Lymphs Abs: 2623 cells/uL (ref 850–3900)
MCH: 30.9 pg (ref 27.0–33.0)
MCHC: 33.9 g/dL (ref 32.0–36.0)
MCV: 91.2 fL (ref 80.0–100.0)
MPV: 10.9 fL (ref 7.5–12.5)
Monocytes Relative: 8.9 %
Neutro Abs: 5461 cells/uL (ref 1500–7800)
Neutrophils Relative %: 58.1 %
Platelets: 278 10*3/uL (ref 140–400)
RBC: 4.21 10*6/uL (ref 3.80–5.10)
RDW: 12.2 % (ref 11.0–15.0)
Total Lymphocyte: 27.9 %
WBC: 9.4 10*3/uL (ref 3.8–10.8)

## 2021-03-01 LAB — RESPIRATORY VIRUS PANEL

## 2021-03-07 ENCOUNTER — Other Ambulatory Visit (HOSPITAL_COMMUNITY): Payer: Self-pay

## 2021-03-07 DIAGNOSIS — H6501 Acute serous otitis media, right ear: Secondary | ICD-10-CM | POA: Diagnosis not present

## 2021-03-07 DIAGNOSIS — H6981 Other specified disorders of Eustachian tube, right ear: Secondary | ICD-10-CM | POA: Diagnosis not present

## 2021-03-07 DIAGNOSIS — H90A31 Mixed conductive and sensorineural hearing loss, unilateral, right ear with restricted hearing on the contralateral side: Secondary | ICD-10-CM | POA: Diagnosis not present

## 2021-03-07 DIAGNOSIS — H6503 Acute serous otitis media, bilateral: Secondary | ICD-10-CM | POA: Diagnosis not present

## 2021-03-07 DIAGNOSIS — H6983 Other specified disorders of Eustachian tube, bilateral: Secondary | ICD-10-CM | POA: Diagnosis not present

## 2021-03-07 MED ORDER — FLUTICASONE PROPIONATE 50 MCG/ACT NA SUSP
2.0000 | Freq: Every evening | NASAL | 2 refills | Status: DC
  Filled 2021-03-07: qty 16, 30d supply, fill #0
  Filled 2021-10-20: qty 16, 30d supply, fill #1

## 2021-03-07 NOTE — Progress Notes (Signed)
[place] Phone: [phone] Subjective:   Stacey [name], am serving as a scribe for Dr. Hulan Saas.  This visit occurred during the SARS-CoV-2 public health emergency.  Safety protocols were in place, including screening questions prior to the visit, additional usage of staff PPE, and extensive cleaning of exam room while observing appropriate contact time as indicated for disinfecting solutions.  I'm seeing this patient by the request  of:  [name], Cresenciano Lick, MD  CC: neck and back pain   YIR:[id number]  Stacey [name] is a 61 y.o. female coming in with complaint of back and neck pain. OMT on 02/06/2021. Feeling tightness in lower back.   Seen for plantar arch pain as well. Patient states that she continues to have pain on bottom of both feet. Able to push through pain and continue to be active.   Medications patient has been prescribed: Vit D  Taking:         Reviewed prior external information including notes and imaging from previsou exam, outside providers and external EMR if available.   As well as notes that were available from care everywhere and other healthcare systems.  Past medical history, social, surgical and family history all reviewed in electronic medical record.  [name] pertanent information unless stated regarding to the chief complaint.   Past Medical History:  Diagnosis Date   Anxiety    Allergy induced asthma   Arthritis    Asthma    Crohn disease ([place])    Depression    GERD (gastroesophageal reflux disease)    History of methicillin resistant staphylococcus aureus (MRSA)    Sleep apnea    CPAP    Allergies  Allergen Reactions   Chlorhexidine Gluconate Itching   Hydrocodone Itching     Review of Systems:  [name] headache, visual changes, nausea, vomiting, diarrhea, constipation, dizziness, abdominal pain, skin rash, fevers, chills, night sweats, weight loss, swollen lymph  nodes, body aches, joint swelling, chest pain, shortness of breath, mood changes. POSITIVE muscle aches  Objective  Blood pressure 110/76, pulse 87, height 5\' 2"  (1.575 m), weight 151 lb (68.5 kg), last menstrual period 01/10/2014, SpO2 97 %.   General: [name] apparent distress alert and oriented x3 mood and affect normal, dressed appropriately.  HEENT: Pupils equal, extraocular movements intact  Respiratory: Patient's speak in full sentences and does not appear short of breath  Cardiovascular: [name] lower extremity edema, non tender, [name] erythema  Neck exam does have tightness noted.  Low back pain loss of lordosis severe tightness 5/5 strength   Osteopathic findings  C2 flexed rotated and side bent right C6 flexed rotated and side bent left T3 extended rotated and side bent right inhaled rib T8 extended rotated and side bent left L2 flexed rotated and side bent right Sacrum right on right     Assessment and Plan:  Lumbar radiculopathy Chronic, with mild exacerbation, discussed which activities to do and which ones to avoid, increase activity slowly over the course the next several weeks.  Discussed icing regimen and home exercises, discussed increasing activity.  Follow-up again in 6 to 8 weeks.   Nonallopathic problems  Decision today to treat with OMT was based on Physical Exam  After verbal consent patient was treated with HVLA, ME, FPR techniques in cervical, rib, thoracic, lumbar, and sacral  areas  Patient tolerated the procedure well with improvement in symptoms  Patient given exercises, stretches and lifestyle modifications  See medications in patient instructions if given  Patient will follow up in 4-8 weeks      The above documentation has been reviewed and is accurate and complete Stacey Pulley, DO        Note: This dictation was prepared with Dragon dictation along with smaller phrase technology. Any transcriptional errors that result from this process are  unintentional.

## 2021-03-08 ENCOUNTER — Ambulatory Visit: Payer: Self-pay

## 2021-03-08 ENCOUNTER — Ambulatory Visit (INDEPENDENT_AMBULATORY_CARE_PROVIDER_SITE_OTHER): Payer: 59 | Admitting: Family Medicine

## 2021-03-08 ENCOUNTER — Encounter: Payer: Self-pay | Admitting: Family Medicine

## 2021-03-08 ENCOUNTER — Other Ambulatory Visit: Payer: Self-pay

## 2021-03-08 VITALS — BP 110/76 | HR 87 | Ht 62.0 in | Wt 151.0 lb

## 2021-03-08 DIAGNOSIS — M79671 Pain in right foot: Secondary | ICD-10-CM

## 2021-03-08 DIAGNOSIS — M9908 Segmental and somatic dysfunction of rib cage: Secondary | ICD-10-CM

## 2021-03-08 DIAGNOSIS — M9903 Segmental and somatic dysfunction of lumbar region: Secondary | ICD-10-CM | POA: Diagnosis not present

## 2021-03-08 DIAGNOSIS — M79672 Pain in left foot: Secondary | ICD-10-CM | POA: Diagnosis not present

## 2021-03-08 DIAGNOSIS — M9901 Segmental and somatic dysfunction of cervical region: Secondary | ICD-10-CM

## 2021-03-08 DIAGNOSIS — M5416 Radiculopathy, lumbar region: Secondary | ICD-10-CM

## 2021-03-08 DIAGNOSIS — M9902 Segmental and somatic dysfunction of thoracic region: Secondary | ICD-10-CM | POA: Diagnosis not present

## 2021-03-08 DIAGNOSIS — M9904 Segmental and somatic dysfunction of sacral region: Secondary | ICD-10-CM

## 2021-03-08 NOTE — Assessment & Plan Note (Signed)
Chronic, with mild exacerbation, discussed which activities to do and which ones to avoid, increase activity slowly over the course the next several weeks.  Discussed icing regimen and home exercises, discussed increasing activity.  Follow-up again in 6 to 8 weeks.

## 2021-03-08 NOTE — Patient Instructions (Signed)
Stretch after activity See me in 4-6 weeks

## 2021-03-16 ENCOUNTER — Other Ambulatory Visit (HOSPITAL_COMMUNITY): Payer: Self-pay

## 2021-03-19 ENCOUNTER — Other Ambulatory Visit: Payer: Self-pay | Admitting: Family Medicine

## 2021-03-20 ENCOUNTER — Other Ambulatory Visit (HOSPITAL_COMMUNITY): Payer: Self-pay

## 2021-03-20 MED ORDER — VITAMIN D (ERGOCALCIFEROL) 1.25 MG (50000 UNIT) PO CAPS
50000.0000 [IU] | ORAL_CAPSULE | ORAL | 0 refills | Status: DC
  Filled 2021-03-20: qty 12, 84d supply, fill #0

## 2021-03-29 ENCOUNTER — Other Ambulatory Visit (HOSPITAL_COMMUNITY): Payer: Self-pay

## 2021-04-04 NOTE — Progress Notes (Signed)
?Charlann Boxer D.O. ?[facility] ?[place] ?Phone: [phone] ?Subjective:   ?I, Jacqualin Combes, am serving as a scribe for Dr. Hulan Saas. ? ?This visit occurred during the SARS-CoV-2 public health emergency.  Safety protocols were in place, including screening questions prior to the visit, additional usage of staff PPE, and extensive cleaning of exam room while observing appropriate contact time as indicated for disinfecting solutions.  ?I'm seeing this patient by the request  of:  [name], Cresenciano Lick, MD ? ?CC: back and neck pain follow up  ? ?DSK:[id number]  ?[name] is a 61 y.o. female coming in with complaint of back and neck pain. OMT 03/08/2021. Patient states that she has not been feeling great since last visit. Spine is painful and tight today.  ? ?Complaining of R CMC joint pain after clipping some grass.  ? ?Also having R knee pain and R shoulder pain. History of PF. Has been playing tennis and pain can be sharp at times but dissipates quickly. Did wear brace to walk this morning but not when playing tennis. Pain R shoulder is over superior aspect. ? ?Medications patient has been prescribed: Vit D ? ?Taking: ? ? ?  ? ? ? ? ?Reviewed prior external information including notes and imaging from previsou exam, outside providers and external EMR if available.  ? ?As well as notes that were available from care everywhere and other healthcare systems. ? ?Past medical history, social, surgical and family history all reviewed in electronic medical record.  No pertanent information unless stated regarding to the chief complaint.  ? ?Past Medical History:  ?Diagnosis Date  ? Anxiety   ? Allergy induced asthma  ? Arthritis   ? Asthma   ? Crohn disease ([place])   ? Depression   ? GERD (gastroesophageal reflux disease)   ? History of methicillin resistant staphylococcus aureus (MRSA)   ? Sleep apnea   ? CPAP  ?  ?Allergies  ?Allergen Reactions  ? Chlorhexidine Gluconate  Itching  ? Hydrocodone Itching  ? ? ? ?Review of Systems: ? No headache, visual changes, nausea, vomiting, diarrhea, constipation, dizziness, abdominal pain, skin rash, fevers, chills, night sweats, weight loss, swollen lymph nodes, body aches, joint swelling, chest pain, shortness of breath, mood changes. POSITIVE muscle aches ? ?Objective  ?Blood pressure 116/78, pulse 73, height 5' 2"  (1.575 m), weight 154 lb (69.9 kg), last menstrual period 01/10/2014, SpO2 98 %. ?  ?General: No apparent distress alert and oriented x3 mood and affect normal, dressed appropriately.  ?HEENT: Pupils equal, extraocular movements intact  ?Respiratory: Patient's speak in full sentences and does not appear short of breath  ?Cardiovascular: No lower extremity edema, non tender, no erythema  ?Patient does have just a small amount of swelling and appears of the lumbar spine.  Patient does have more of a body sensation.  Tightness with FABER test.  Worsening pain with extension of the back. ?Patient's neck exam also is considerably tighter than usual. ? ?Osteopathic findings ? ?C5 flexed rotated and side bent left ?C7 flexed rotated and side bent right ?T3 extended rotated and side bent right inhaled rib ?T7 extended rotated and side bent left ?L2 flexed rotated and side bent right ?Sacrum right on right ? ? ? ? ?  ?Assessment and Plan: ? ?Patellofemoral syndrome of right knee ?Or worsening symptoms.  Did discuss the possibility of a Tru pull lite.  Patient is to try to do this and I think will  be beneficial.  Patient will do his work more working out.  We discussed icing regimen and home exercises.  Increase activity slowly otherwise.  Worsening pain consider injection. ? ?Lumbar radiculopathy ?Increased tightness noted of the lower back. ?Concern the patient could be potentially having a possible exacerbation of the potential underlying Crohn's.  We discussed with patient and given prednisone.  Patient is also had recently an effusion of  the years so this should be potentially beneficial as well.  Depending on how patient responds we will discuss other treatment options.  Patient will bridge with her Celebrex twice a day after taking the prednisone.  Follow-up with me again in 6 to 8 weeks ?  ? ?Nonallopathic problems ? ?Decision today to treat with OMT was based on Physical Exam ? ?After verbal consent patient was treated with HVLA, ME, FPR techniques in cervical, rib, thoracic, lumbar, and sacral  areas ? ?Patient tolerated the procedure well with improvement in symptoms ? ?Patient given exercises, stretches and lifestyle modifications ? ?See medications in patient instructions if given ? ?Patient will follow up in 4-8 weeks ? ?  ? ? ?The above documentation has been reviewed and is accurate and complete Lyndal Pulley, DO ? ? ? ?  ? ? Note: This dictation was prepared with Dragon dictation along with smaller phrase technology. Any transcriptional errors that result from this process are unintentional.    ?  ?  ? ?

## 2021-04-05 ENCOUNTER — Ambulatory Visit (INDEPENDENT_AMBULATORY_CARE_PROVIDER_SITE_OTHER): Payer: 59 | Admitting: Family Medicine

## 2021-04-05 ENCOUNTER — Other Ambulatory Visit (HOSPITAL_COMMUNITY): Payer: Self-pay

## 2021-04-05 ENCOUNTER — Other Ambulatory Visit: Payer: Self-pay

## 2021-04-05 VITALS — BP 116/78 | HR 73 | Ht 62.0 in | Wt 154.0 lb

## 2021-04-05 DIAGNOSIS — M9904 Segmental and somatic dysfunction of sacral region: Secondary | ICD-10-CM

## 2021-04-05 DIAGNOSIS — M222X1 Patellofemoral disorders, right knee: Secondary | ICD-10-CM

## 2021-04-05 DIAGNOSIS — M9908 Segmental and somatic dysfunction of rib cage: Secondary | ICD-10-CM | POA: Diagnosis not present

## 2021-04-05 DIAGNOSIS — M9901 Segmental and somatic dysfunction of cervical region: Secondary | ICD-10-CM

## 2021-04-05 DIAGNOSIS — M9903 Segmental and somatic dysfunction of lumbar region: Secondary | ICD-10-CM | POA: Diagnosis not present

## 2021-04-05 DIAGNOSIS — M5416 Radiculopathy, lumbar region: Secondary | ICD-10-CM | POA: Diagnosis not present

## 2021-04-05 DIAGNOSIS — M9902 Segmental and somatic dysfunction of thoracic region: Secondary | ICD-10-CM

## 2021-04-05 MED ORDER — PREDNISONE 20 MG PO TABS
40.0000 mg | ORAL_TABLET | Freq: Every day | ORAL | 0 refills | Status: DC
  Filled 2021-04-05: qty 10, 5d supply, fill #0

## 2021-04-05 NOTE — Assessment & Plan Note (Signed)
Or worsening symptoms.  Did discuss the possibility of a Tru pull lite.  Patient is to try to do this and I think will be beneficial.  Patient will do his work more working out.  We discussed icing regimen and home exercises.  Increase activity slowly otherwise.  Worsening pain consider injection. ?

## 2021-04-05 NOTE — Assessment & Plan Note (Signed)
Increased tightness noted of the lower back. ?Concern the patient could be potentially having a possible exacerbation of the potential underlying Crohn's.  We discussed with patient and given prednisone.  Patient is also had recently an effusion of the years so this should be potentially beneficial as well.  Depending on how patient responds we will discuss other treatment options.  Patient will bridge with her Celebrex twice a day after taking the prednisone.  Follow-up with me again in 6 to 8 weeks ?

## 2021-04-05 NOTE — Patient Instructions (Signed)
Prednisone 57m daily for 5 days ?Stop Celebrex during ?Then do Celebrex 2x a day for 5 days ?Continue to stay active where you can  ?See you again in 4-5 weeks ?

## 2021-04-12 ENCOUNTER — Ambulatory Visit: Payer: 59 | Admitting: Family Medicine

## 2021-04-16 ENCOUNTER — Other Ambulatory Visit (HOSPITAL_COMMUNITY): Payer: Self-pay

## 2021-04-16 DIAGNOSIS — J342 Deviated nasal septum: Secondary | ICD-10-CM | POA: Diagnosis not present

## 2021-04-16 DIAGNOSIS — H90A31 Mixed conductive and sensorineural hearing loss, unilateral, right ear with restricted hearing on the contralateral side: Secondary | ICD-10-CM | POA: Diagnosis not present

## 2021-04-16 DIAGNOSIS — J3489 Other specified disorders of nose and nasal sinuses: Secondary | ICD-10-CM | POA: Diagnosis not present

## 2021-04-16 DIAGNOSIS — Z8709 Personal history of other diseases of the respiratory system: Secondary | ICD-10-CM | POA: Diagnosis not present

## 2021-04-16 DIAGNOSIS — Z8669 Personal history of other diseases of the nervous system and sense organs: Secondary | ICD-10-CM | POA: Diagnosis not present

## 2021-04-16 DIAGNOSIS — H6121 Impacted cerumen, right ear: Secondary | ICD-10-CM | POA: Diagnosis not present

## 2021-04-16 DIAGNOSIS — H6983 Other specified disorders of Eustachian tube, bilateral: Secondary | ICD-10-CM | POA: Diagnosis not present

## 2021-04-16 DIAGNOSIS — H90A22 Sensorineural hearing loss, unilateral, left ear, with restricted hearing on the contralateral side: Secondary | ICD-10-CM | POA: Diagnosis not present

## 2021-04-23 ENCOUNTER — Other Ambulatory Visit (HOSPITAL_COMMUNITY): Payer: Self-pay

## 2021-04-27 ENCOUNTER — Other Ambulatory Visit (HOSPITAL_COMMUNITY): Payer: Self-pay

## 2021-05-02 ENCOUNTER — Other Ambulatory Visit (HOSPITAL_COMMUNITY): Payer: Self-pay

## 2021-05-02 NOTE — Progress Notes (Signed)
?Charlann Boxer D.O. ?[facility] ?[place] ?Phone: [phone] ?Subjective:   ?I, Jacqualin Combes, am serving as a scribe for Dr. Hulan Saas. ? ?This visit occurred during the SARS-CoV-2 public health emergency.  Safety protocols were in place, including screening questions prior to the visit, additional usage of staff PPE, and extensive cleaning of exam room while observing appropriate contact time as indicated for disinfecting solutions.  ? ?I'm seeing this patient by the request  of:  [name], Cresenciano Lick, MD ? ?CC: Low back pain ? ?[id number]  ?KAMISHA ELL is a 61 y.o. female coming in with complaint of back and neck pain. OMT 04/05/2021. Patient states that she has soreness in back and knee but little to no pain with playing.  ? ?Medications patient has been prescribed: Vit D ? ?Taking: ? ? ?  ? ? ? ? ?Reviewed prior external information including notes and imaging from previsou exam, outside providers and external EMR if available.  ? ?As well as notes that were available from care everywhere and other healthcare systems. ? ?Past medical history, social, surgical and family history all reviewed in electronic medical record.  No pertanent information unless stated regarding to the chief complaint.  ? ?Past Medical History:  ?Diagnosis Date  ? Anxiety   ? Allergy induced asthma  ? Arthritis   ? Asthma   ? Crohn disease ([place])   ? Depression   ? GERD (gastroesophageal reflux disease)   ? History of methicillin resistant staphylococcus aureus (MRSA)   ? Sleep apnea   ? CPAP  ?  ?Allergies  ?Allergen Reactions  ? Chlorhexidine Gluconate Itching  ? Hydrocodone Itching  ? ? ? ?Review of Systems: ? No headache, visual changes, nausea, vomiting, diarrhea, constipation, dizziness, abdominal pain, skin rash, fevers, chills, night sweats, weight loss, swollen lymph nodes, body aches, joint swelling, chest pain, shortness of breath, mood changes. POSITIVE muscle aches ? ?Objective   ?Blood pressure 112/74, pulse 71, height 5' 2"  (1.575 m), weight 154 lb (69.9 kg), last menstrual period 01/10/2014, SpO2 96 %. ?  ?General: No apparent distress alert and oriented x3 mood and affect normal, dressed appropriately.  ?HEENT: Pupils equal, extraocular movements intact  ?Respiratory: Patient's speak in full sentences and does not appear short of breath  ?Cardiovascular: No lower extremity edema, non tender, no erythema  ?Low back exam does have some loss of lordosis.  Does have some tightness noted especially in the piriformis bilateral right greater than left.  Patient is having more pain though with some mild extension of the lower back as well. ?Neck exam does have some tightness noted at the occipital area.  Negative Spurling's. ? ?Osteopathic findings ? ?C3 flexed rotated and side bent right ?C4 flexed rotated and side bent left ?T3 extended rotated and side bent right inhaled rib ?L3 flexed rotated and side bent left ?Sacrum right on right ? ? ?  ?Assessment and Plan: ? ?Lumbar radiculopathy ?Patient has been doing relatively well with the osteopathic manipulation.  Discussed icing regimen and home exercises. ? ?More stable but will be traveling.  Discussed icing regimen and home exercises, which activities to do which ones to avoid.  Patient given prednisone and Zanaflex for any breakthrough pain.  Follow-up again in 6 to 8 weeks  ? ?Nonallopathic problems ? ?Decision today to treat with OMT was based on Physical Exam ? ?After verbal consent patient was treated with HVLA, ME, FPR techniques in cervical, rib, thoracic, lumbar,  and sacral  areas ? ?Patient tolerated the procedure well with improvement in symptoms ? ?Patient given exercises, stretches and lifestyle modifications ? ?See medications in patient instructions if given ? ?Patient will follow up in 4-8 weeks ? ?  ? ? ?The above documentation has been reviewed and is accurate and complete Lyndal Pulley, DO ? ? ? ?  ? ? Note: This  dictation was prepared with Dragon dictation along with smaller phrase technology. Any transcriptional errors that result from this process are unintentional.    ?  ?  ? ?

## 2021-05-04 ENCOUNTER — Other Ambulatory Visit (HOSPITAL_COMMUNITY): Payer: Self-pay

## 2021-05-04 ENCOUNTER — Ambulatory Visit (INDEPENDENT_AMBULATORY_CARE_PROVIDER_SITE_OTHER): Payer: 59 | Admitting: Family Medicine

## 2021-05-04 VITALS — BP 112/74 | HR 71 | Ht 62.0 in | Wt 154.0 lb

## 2021-05-04 DIAGNOSIS — M9904 Segmental and somatic dysfunction of sacral region: Secondary | ICD-10-CM | POA: Diagnosis not present

## 2021-05-04 DIAGNOSIS — M5416 Radiculopathy, lumbar region: Secondary | ICD-10-CM

## 2021-05-04 DIAGNOSIS — M9908 Segmental and somatic dysfunction of rib cage: Secondary | ICD-10-CM | POA: Diagnosis not present

## 2021-05-04 DIAGNOSIS — M9903 Segmental and somatic dysfunction of lumbar region: Secondary | ICD-10-CM

## 2021-05-04 DIAGNOSIS — M9901 Segmental and somatic dysfunction of cervical region: Secondary | ICD-10-CM

## 2021-05-04 DIAGNOSIS — M9902 Segmental and somatic dysfunction of thoracic region: Secondary | ICD-10-CM | POA: Diagnosis not present

## 2021-05-04 MED ORDER — PREDNISONE 20 MG PO TABS
40.0000 mg | ORAL_TABLET | Freq: Every day | ORAL | 0 refills | Status: DC
  Filled 2021-05-04: qty 10, 5d supply, fill #0

## 2021-05-04 MED ORDER — TIZANIDINE HCL 2 MG PO TABS
2.0000 mg | ORAL_TABLET | Freq: Every day | ORAL | 0 refills | Status: DC
  Filled 2021-05-04: qty 30, 30d supply, fill #0

## 2021-05-04 NOTE — Patient Instructions (Addendum)
Prednisone 82m refilled ?Zanaflex 288mtake on plane if needed ?See you again when you get back from [place]

## 2021-05-04 NOTE — Assessment & Plan Note (Signed)
Patient has been doing relatively well with the osteopathic manipulation.  Discussed icing regimen and home exercises. ? ?More stable but will be traveling.  Discussed icing regimen and home exercises, which activities to do which ones to avoid.  Patient given prednisone and Zanaflex for any breakthrough pain.  Follow-up again in 6 to 8 weeks ?

## 2021-05-10 ENCOUNTER — Other Ambulatory Visit: Payer: Self-pay | Admitting: Cardiovascular Disease

## 2021-05-10 ENCOUNTER — Other Ambulatory Visit (HOSPITAL_COMMUNITY): Payer: Self-pay

## 2021-05-18 ENCOUNTER — Other Ambulatory Visit: Payer: Self-pay | Admitting: Internal Medicine

## 2021-05-18 ENCOUNTER — Other Ambulatory Visit (HOSPITAL_COMMUNITY): Payer: Self-pay

## 2021-05-18 ENCOUNTER — Other Ambulatory Visit: Payer: Self-pay | Admitting: Family Medicine

## 2021-05-21 ENCOUNTER — Other Ambulatory Visit (HOSPITAL_COMMUNITY): Payer: Self-pay

## 2021-05-21 MED ORDER — DULOXETINE HCL 60 MG PO CPEP
60.0000 mg | ORAL_CAPSULE | Freq: Every day | ORAL | 1 refills | Status: DC
Start: 2021-05-21 — End: 2021-11-23
  Filled 2021-05-21: qty 90, 90d supply, fill #0
  Filled 2021-08-17: qty 90, 90d supply, fill #1

## 2021-05-21 MED ORDER — VITAMIN D (ERGOCALCIFEROL) 1.25 MG (50000 UNIT) PO CAPS
50000.0000 [IU] | ORAL_CAPSULE | ORAL | 0 refills | Status: DC
Start: 2021-05-21 — End: 2021-09-20
  Filled 2021-05-21 – 2021-06-14 (×3): qty 12, 84d supply, fill #0

## 2021-05-24 ENCOUNTER — Other Ambulatory Visit (HOSPITAL_COMMUNITY): Payer: Self-pay

## 2021-05-25 ENCOUNTER — Other Ambulatory Visit (HOSPITAL_COMMUNITY): Payer: Self-pay

## 2021-05-25 ENCOUNTER — Other Ambulatory Visit: Payer: Self-pay | Admitting: Pharmacist

## 2021-05-25 MED ORDER — HUMIRA (2 SYRINGE) 40 MG/0.4ML ~~LOC~~ PSKT
40.0000 mg | PREFILLED_SYRINGE | SUBCUTANEOUS | 3 refills | Status: DC
  Filled 2021-05-25: qty 2, 28d supply, fill #0

## 2021-05-25 MED ORDER — HUMIRA (2 SYRINGE) 40 MG/0.4ML ~~LOC~~ PSKT
40.0000 mg | PREFILLED_SYRINGE | SUBCUTANEOUS | 3 refills | Status: DC
  Filled 2021-05-25: qty 2, 28d supply, fill #0
  Filled 2021-06-28: qty 2, 28d supply, fill #1
  Filled 2021-07-24: qty 2, 28d supply, fill #2
  Filled 2021-08-30: qty 2, 28d supply, fill #3

## 2021-06-15 ENCOUNTER — Other Ambulatory Visit (HOSPITAL_COMMUNITY): Payer: Self-pay

## 2021-06-15 NOTE — Progress Notes (Deleted)
  [place] Phone: [phone] Subjective:    I'm seeing this patient by the request  of:  [name], Cresenciano Lick, MD  CC:   [id number]  [name] is a 61 y.o. female coming in with complaint of back and neck pain. OMT on 05/04/2021. Patient states   Medications patient has been prescribed: Vit D Zanaflex Prednisone  Taking:         Reviewed prior external information including notes and imaging from previsou exam, outside providers and external EMR if available.   As well as notes that were available from care everywhere and other healthcare systems.  Past medical history, social, surgical and family history all reviewed in electronic medical record.  No pertanent information unless stated regarding to the chief complaint.   Past Medical History:  Diagnosis Date   Anxiety    Allergy induced asthma   Arthritis    Asthma    Crohn disease ([place])    Depression    GERD (gastroesophageal reflux disease)    History of methicillin resistant staphylococcus aureus (MRSA)    Sleep apnea    CPAP    Allergies  Allergen Reactions   Chlorhexidine Gluconate Itching   Hydrocodone Itching     Review of Systems:  No headache, visual changes, nausea, vomiting, diarrhea, constipation, dizziness, abdominal pain, skin rash, fevers, chills, night sweats, weight loss, swollen lymph nodes, body aches, joint swelling, chest pain, shortness of breath, mood changes. POSITIVE muscle aches  Objective  Last menstrual period 01/10/2014.   General: No apparent distress alert and oriented x3 mood and affect normal, dressed appropriately.  HEENT: Pupils equal, extraocular movements intact  Respiratory: Patient's speak in full sentences and does not appear short of breath  Cardiovascular: No lower extremity edema, non tender, no erythema  Neuro: Cranial nerves II through XII are intact, neurovascularly intact in all  extremities with 2+ DTRs and 2+ pulses.  Gait normal with good balance and coordination.  MSK:  Non tender with full range of motion and good stability and symmetric strength and tone of shoulders, elbows, wrist, hip, knee and ankles bilaterally.  Back - Normal skin, Spine with normal alignment and no deformity.  No tenderness to vertebral process palpation.  Paraspinous muscles are not tender and without spasm.   Range of motion is full at neck and lumbar sacral regions  Osteopathic findings  C2 flexed rotated and side bent right C6 flexed rotated and side bent left T3 extended rotated and side bent right inhaled rib T9 extended rotated and side bent left L2 flexed rotated and side bent right Sacrum right on right       Assessment and Plan:    Nonallopathic problems  Decision today to treat with OMT was based on Physical Exam  After verbal consent patient was treated with HVLA, ME, FPR techniques in cervical, rib, thoracic, lumbar, and sacral  areas  Patient tolerated the procedure well with improvement in symptoms  Patient given exercises, stretches and lifestyle modifications  See medications in patient instructions if given  Patient will follow up in 4-8 weeks      The above documentation has been reviewed and is accurate and complete Delsa Sale       Note: This dictation was prepared with Diplomatic Services operational officer dictation along with smaller phrase technology. Any transcriptional errors that result from this process are unintentional.

## 2021-06-18 ENCOUNTER — Ambulatory Visit: Payer: 59 | Admitting: Family Medicine

## 2021-06-28 ENCOUNTER — Other Ambulatory Visit (HOSPITAL_COMMUNITY): Payer: Self-pay

## 2021-06-28 NOTE — Progress Notes (Signed)
[name] [place] [address] [place] Phone: [phone] Subjective:   [name], am serving as a scribe for Dr. Hulan Saas.  I'm seeing this patient by the request  of:  [name], Cresenciano Lick, MD  CC: Back pain neck pain  [id number]  [name] is a 61 y.o. female coming in with complaint of back and neck pain. OMT on 05/04/2021. Patient states doing well. Same per usual.  Travel to [place].  Had a good time.  Unfortunately patient is dealing with a drinking issue at the moment that has caused increasing stress.  Medications patient has been prescribed: Vit D, prednisone, Zanaflex  Taking:         Reviewed prior external information including notes and imaging from previsou exam, outside providers and external EMR if available.   As well as notes that were available from care everywhere and other healthcare systems.  Past medical history, social, surgical and family history all reviewed in electronic medical record.  No pertanent information unless stated regarding to the chief complaint.   Past Medical History:  Diagnosis Date   Anxiety    Allergy induced asthma   Arthritis    Asthma    Crohn disease ([place])    Depression    GERD (gastroesophageal reflux disease)    History of methicillin resistant staphylococcus aureus (MRSA)    Sleep apnea    CPAP    Allergies  Allergen Reactions   Chlorhexidine Gluconate Itching   Hydrocodone Itching     Review of Systems:  No headache, visual changes, nausea, vomiting, diarrhea, constipation, dizziness, abdominal pain, skin rash, fevers, chills, night sweats, weight loss, swollen lymph nodes, body aches, joint swelling, chest pain, shortness of breath, mood changes. POSITIVE muscle aches  Objective  Blood pressure 118/74, pulse 82, height 5' 2"  (1.575 m), weight 151 lb (68.5 kg), last menstrual period 01/10/2014, SpO2 98 %.   General: No apparent distress alert and  oriented x3 mood and affect normal, dressed appropriately.  HEENT: Pupils equal, extraocular movements intact  Respiratory: Patient's speak in full sentences and does not appear short of breath  Cardiovascular: No lower extremity edema, non tender, no erythema  Gait MSK:  Back significant increase in tightness on the soft tissues of the back all the way from the occipital area To be scapula.  Also starts again in the thoracolumbar junction down to the sacral area.  Tightness with FABER test.  Tightness with any range of motion.  Patient does seem visibly anxious.  Osteopathic findings  C2 flexed rotated and side bent right C3 flexed rotated and side bent left C6 flexed rotated and side bent left T8 extended rotated and side bent left inhaled rib T9 extended rotated and side bent left L2 flexed rotated and side bent right Sacrum right on right       Assessment and Plan:  Lumbar radiculopathy Tightness noted overall.  Patient does seem to be more stressed and feeling anything out of place.  Having difficulty with getting patient to relax today.  We will tentatively osteopathic manipulation. Patient is doing good with the duloxetine at the moment for some other pain relief as well. We discussed the Zanaflex and possible using it on a regular basis for the next 3 nights to help her with sleep secondary to the underlying anxiety.  Follow-up with me again in 6 to 8 weeks otherwise.    Nonallopathic problems  Decision today to treat with OMT  was based on Physical Exam  After verbal consent patient was treated with HVLA, ME, FPR techniques in cervical, rib, thoracic, lumbar, and sacral  areas  Patient tolerated the procedure well with improvement in symptoms  Patient given exercises, stretches and lifestyle modifications  See medications in patient instructions if given  Patient will follow up in 4-8 weeks      The above documentation has been reviewed and is accurate and  complete Stacey Pulley, DO        Note: This dictation was prepared with Dragon dictation along with smaller phrase technology. Any transcriptional errors that result from this process are unintentional.

## 2021-06-29 ENCOUNTER — Ambulatory Visit (INDEPENDENT_AMBULATORY_CARE_PROVIDER_SITE_OTHER): Payer: 59 | Admitting: Family Medicine

## 2021-06-29 ENCOUNTER — Encounter: Payer: Self-pay | Admitting: Family Medicine

## 2021-06-29 VITALS — BP 118/74 | HR 82 | Ht 62.0 in | Wt 151.0 lb

## 2021-06-29 DIAGNOSIS — M9902 Segmental and somatic dysfunction of thoracic region: Secondary | ICD-10-CM

## 2021-06-29 DIAGNOSIS — M9903 Segmental and somatic dysfunction of lumbar region: Secondary | ICD-10-CM | POA: Diagnosis not present

## 2021-06-29 DIAGNOSIS — M9908 Segmental and somatic dysfunction of rib cage: Secondary | ICD-10-CM | POA: Diagnosis not present

## 2021-06-29 DIAGNOSIS — M9904 Segmental and somatic dysfunction of sacral region: Secondary | ICD-10-CM

## 2021-06-29 DIAGNOSIS — M5416 Radiculopathy, lumbar region: Secondary | ICD-10-CM

## 2021-06-29 DIAGNOSIS — M9901 Segmental and somatic dysfunction of cervical region: Secondary | ICD-10-CM

## 2021-06-29 NOTE — Patient Instructions (Signed)
Good to see you! So sorry for what you're going through See you again in 4 weeks (ok to double)

## 2021-06-29 NOTE — Assessment & Plan Note (Signed)
Tightness noted overall.  Patient does seem to be more stressed and feeling anything out of place.  Having difficulty with getting patient to relax today.  We will tentatively osteopathic manipulation. Patient is doing good with the duloxetine at the moment for some other pain relief as well. We discussed the Zanaflex and possible using it on a regular basis for the next 3 nights to help her with sleep secondary to the underlying anxiety.  Follow-up with me again in 6 to 8 weeks otherwise.

## 2021-07-04 ENCOUNTER — Other Ambulatory Visit (HOSPITAL_COMMUNITY): Payer: Self-pay

## 2021-07-20 ENCOUNTER — Other Ambulatory Visit (HOSPITAL_COMMUNITY): Payer: Self-pay

## 2021-07-24 ENCOUNTER — Other Ambulatory Visit (HOSPITAL_COMMUNITY): Payer: Self-pay

## 2021-07-26 ENCOUNTER — Other Ambulatory Visit (HOSPITAL_COMMUNITY): Payer: Self-pay
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
## 2021-07-27 ENCOUNTER — Ambulatory Visit (INDEPENDENT_AMBULATORY_CARE_PROVIDER_SITE_OTHER): Payer: 59 | Admitting: Family Medicine

## 2021-07-27 ENCOUNTER — Ambulatory Visit: Payer: Self-pay

## 2021-07-27 VITALS — BP 116/90 | HR 74 | Ht 62.0 in | Wt 154.0 lb

## 2021-07-27 DIAGNOSIS — M9901 Segmental and somatic dysfunction of cervical region: Secondary | ICD-10-CM | POA: Diagnosis not present

## 2021-07-27 DIAGNOSIS — M5416 Radiculopathy, lumbar region: Secondary | ICD-10-CM | POA: Diagnosis not present

## 2021-07-27 DIAGNOSIS — M25512 Pain in left shoulder: Secondary | ICD-10-CM | POA: Diagnosis not present

## 2021-07-27 DIAGNOSIS — M9908 Segmental and somatic dysfunction of rib cage: Secondary | ICD-10-CM | POA: Diagnosis not present

## 2021-07-27 DIAGNOSIS — M9902 Segmental and somatic dysfunction of thoracic region: Secondary | ICD-10-CM

## 2021-07-27 DIAGNOSIS — S4352XA Sprain of left acromioclavicular joint, initial encounter: Secondary | ICD-10-CM | POA: Diagnosis not present

## 2021-07-27 DIAGNOSIS — M9904 Segmental and somatic dysfunction of sacral region: Secondary | ICD-10-CM

## 2021-07-27 DIAGNOSIS — M9903 Segmental and somatic dysfunction of lumbar region: Secondary | ICD-10-CM

## 2021-07-27 NOTE — Patient Instructions (Addendum)
Good to see you [name] joint injection today See me again in 6-8 weeks

## 2021-07-27 NOTE — Assessment & Plan Note (Signed)
Injection given improvement range of motion almost immediately.  Patient is to increase activity slowly.  Follow-up again in 6 to 8 weeks

## 2021-07-27 NOTE — Assessment & Plan Note (Signed)
Likely no significant radicular symptoms at the moment.  Seems to be responding extremely well to osteopathic manipulation.  Follow-up in 6 to 8 weeks

## 2021-07-30 DIAGNOSIS — Z760 Encounter for issue of repeat prescription: Secondary | ICD-10-CM | POA: Diagnosis not present

## 2021-07-31 ENCOUNTER — Other Ambulatory Visit (HOSPITAL_COMMUNITY): Payer: Self-pay

## 2021-08-01 ENCOUNTER — Other Ambulatory Visit (HOSPITAL_COMMUNITY): Payer: Self-pay

## 2021-08-03 ENCOUNTER — Other Ambulatory Visit (HOSPITAL_COMMUNITY): Payer: Self-pay

## 2021-08-03 ENCOUNTER — Other Ambulatory Visit: Payer: Self-pay | Admitting: Internal Medicine

## 2021-08-03 MED ORDER — BUSPIRONE HCL 7.5 MG PO TABS
7.5000 mg | ORAL_TABLET | Freq: Two times a day (BID) | ORAL | 2 refills | Status: DC
  Filled 2021-08-03: qty 180, 90d supply, fill #0
  Filled 2021-10-31: qty 180, 90d supply, fill #1
  Filled 2022-02-15: qty 180, 90d supply, fill #2

## 2021-08-17 ENCOUNTER — Other Ambulatory Visit (HOSPITAL_COMMUNITY): Payer: Self-pay

## 2021-08-28 ENCOUNTER — Other Ambulatory Visit (HOSPITAL_COMMUNITY): Payer: Self-pay

## 2021-08-29 ENCOUNTER — Other Ambulatory Visit (HOSPITAL_COMMUNITY): Payer: Self-pay

## 2021-08-30 ENCOUNTER — Other Ambulatory Visit: Payer: 59

## 2021-08-30 ENCOUNTER — Other Ambulatory Visit (HOSPITAL_COMMUNITY): Payer: Self-pay

## 2021-08-30 DIAGNOSIS — R5383 Other fatigue: Secondary | ICD-10-CM

## 2021-08-30 DIAGNOSIS — E7800 Pure hypercholesterolemia, unspecified: Secondary | ICD-10-CM

## 2021-08-30 DIAGNOSIS — Z8719 Personal history of other diseases of the digestive system: Secondary | ICD-10-CM | POA: Diagnosis not present

## 2021-08-31 LAB — CBC WITH DIFFERENTIAL/PLATELET
Absolute Monocytes: 592 cells/uL (ref 200–950)
Basophils Absolute: 98 cells/uL (ref 0–200)
Basophils Relative: 1.6 %
Eosinophils Absolute: 451 cells/uL (ref 15–500)
Eosinophils Relative: 7.4 %
HCT: 40 % (ref 35.0–45.0)
Hemoglobin: 13.3 g/dL (ref 11.7–15.5)
Lymphs Abs: 2092 cells/uL (ref 850–3900)
MCH: 31 pg (ref 27.0–33.0)
MCHC: 33.3 g/dL (ref 32.0–36.0)
MCV: 93.2 fL (ref 80.0–100.0)
MPV: 9.9 fL (ref 7.5–12.5)
Monocytes Relative: 9.7 %
Neutro Abs: 2867 cells/uL (ref 1500–7800)
Neutrophils Relative %: 47 %
Platelets: 279 10*3/uL (ref 140–400)
RBC: 4.29 10*6/uL (ref 3.80–5.10)
RDW: 12.6 % (ref 11.0–15.0)
Total Lymphocyte: 34.3 %
WBC: 6.1 10*3/uL (ref 3.8–10.8)

## 2021-08-31 LAB — COMPLETE METABOLIC PANEL WITH GFR
AG Ratio: 1.6 (calc) (ref 1.0–2.5)
ALT: 18 U/L (ref 6–29)
AST: 24 U/L (ref 10–35)
Albumin: 4.5 g/dL (ref 3.6–5.1)
Alkaline phosphatase (APISO): 53 U/L (ref 37–153)
BUN: 13 mg/dL (ref 7–25)
CO2: 29 mmol/L (ref 20–32)
Calcium: 9.8 mg/dL (ref 8.6–10.4)
Chloride: 105 mmol/L (ref 98–110)
Creat: 0.74 mg/dL (ref 0.50–1.05)
Globulin: 2.9 g/dL (calc) (ref 1.9–3.7)
Glucose, Bld: 86 mg/dL (ref 65–99)
Potassium: 4.4 mmol/L (ref 3.5–5.3)
Sodium: 140 mmol/L (ref 135–146)
Total Bilirubin: 0.8 mg/dL (ref 0.2–1.2)
Total Protein: 7.4 g/dL (ref 6.1–8.1)
eGFR: 92 mL/min/{1.73_m2} (ref >=60)

## 2021-08-31 LAB — LIPID PANEL
Cholesterol: 275 mg/dL — ABNORMAL HIGH (ref <200)
HDL: 96 mg/dL (ref >=50)
LDL Cholesterol (Calc): 160 mg/dL (calc) — ABNORMAL HIGH
Non-HDL Cholesterol (Calc): 179 mg/dL (calc) — ABNORMAL HIGH (ref <130)
Total CHOL/HDL Ratio: 2.9 (calc) (ref <5.0)
Triglycerides: 84 mg/dL (ref <150)

## 2021-08-31 LAB — TSH: TSH: 3.08 mIU/L (ref 0.40–4.50)

## 2021-09-04 ENCOUNTER — Ambulatory Visit (INDEPENDENT_AMBULATORY_CARE_PROVIDER_SITE_OTHER): Payer: 59 | Admitting: Internal Medicine

## 2021-09-04 ENCOUNTER — Encounter: Payer: Self-pay | Admitting: Internal Medicine

## 2021-09-04 VITALS — BP 114/72 | HR 83 | Temp 98.2°F | Ht 62.25 in | Wt 154.8 lb

## 2021-09-04 DIAGNOSIS — J309 Allergic rhinitis, unspecified: Secondary | ICD-10-CM

## 2021-09-04 DIAGNOSIS — E7800 Pure hypercholesterolemia, unspecified: Secondary | ICD-10-CM

## 2021-09-04 DIAGNOSIS — Z8719 Personal history of other diseases of the digestive system: Secondary | ICD-10-CM | POA: Diagnosis not present

## 2021-09-04 DIAGNOSIS — Z780 Asymptomatic menopausal state: Secondary | ICD-10-CM

## 2021-09-04 DIAGNOSIS — Z0000 Encounter for general adult medical examination without abnormal findings: Secondary | ICD-10-CM

## 2021-09-04 DIAGNOSIS — G8929 Other chronic pain: Secondary | ICD-10-CM | POA: Diagnosis not present

## 2021-09-04 DIAGNOSIS — M7918 Myalgia, other site: Secondary | ICD-10-CM | POA: Diagnosis not present

## 2021-09-04 DIAGNOSIS — Z8669 Personal history of other diseases of the nervous system and sense organs: Secondary | ICD-10-CM | POA: Diagnosis not present

## 2021-09-04 DIAGNOSIS — Z79620 Long term (current) use of immunosuppressive biologic: Secondary | ICD-10-CM | POA: Diagnosis not present

## 2021-09-04 NOTE — Patient Instructions (Signed)
Referral to Cardiologist, Dr Melvenia Needles. Has statin intolerance.
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
## 2021-09-05 ENCOUNTER — Other Ambulatory Visit (HOSPITAL_COMMUNITY): Payer: Self-pay

## 2021-09-10 NOTE — Progress Notes (Unsigned)
[facility] [place] Phone: [phone] Subjective:   Stacey [name], am serving as a scribe for Dr. Hulan Saas.  I'm seeing this patient by the request  of:  [name], Cresenciano Lick, MD  CC: neck and back pain f/u  [id number]  Stacey [name] is a 61 y.o. female coming in with complaint of back and neck pain. OMT on 07/27/2021. Also seen for shoulder pain. Was given an AC injection last visit. Patient states that she is doing much better following the injection. Back is tight.   Pain in R knee, anterior aspect. Patient has been wearing a brace during tennis.  Medications patient has been prescribed: Vit D  Taking:yes         Reviewed prior external information including notes and imaging from previsou exam, outside providers and external EMR if available.   As well as notes that were available from care everywhere and other healthcare systems.  Past medical history, social, surgical and family history all reviewed in electronic medical record.  [name] pertanent information unless stated regarding to the chief complaint.   Past Medical History:  Diagnosis Date   Anxiety    Allergy induced asthma   Arthritis    Asthma    Crohn disease ([place])    Depression    GERD (gastroesophageal reflux disease)    History of methicillin resistant staphylococcus aureus (MRSA)    Sleep apnea    CPAP    Allergies  Allergen Reactions   Chlorhexidine Gluconate Itching   Hydrocodone Itching     Review of Systems:  [name] headache, visual changes, nausea, vomiting, diarrhea, constipation, dizziness, abdominal pain, skin rash, fevers, chills, night sweats, weight loss, swollen lymph nodes, body aches, joint swelling, chest pain, shortness of breath, mood changes. POSITIVE muscle aches  Objective  Blood pressure 118/76, pulse 97, height 5' 2"  (1.575 m), weight 152 lb (68.9 kg), last menstrual period 01/10/2014, SpO2 98 %.   General:  [name] apparent distress alert and oriented x3 mood and affect normal, dressed appropriately.  HEENT: Pupils equal, extraocular movements intact  Respiratory: Patient's speak in full sentences and does not appear short of breath  Cardiovascular: [name] lower extremity edema, non tender, [name] erythema  Gait MSK:  Left shoulder exam shows the patient has full range of motion of the shoulder.  Negative crossover.  [name] tenderness over the acromioclavicular joint Back does have some tightness still noted in the lumbar spine.  Does have tightness with FABER test.  Worsening pain with the last 10 degrees of flexion and extension.  Osteopathic findings  C4 flexed rotated and side bent right T3 extended rotated and side bent right inhaled rib T8 extended rotated and side bent left L1 flexed rotated and side bent right Sacrum right on right       Assessment and Plan:  Lumbar radiculopathy Symptoms at this time.  Is responding extremely well though to osteopathic patient.  Discussed exercises.  Increase activity slowly Celebrex for breakthrough pain and is taking the duloxetine.  Follow-up again in 6-8 weeks    Nonallopathic problems  Decision today to treat with OMT was based on Physical Exam  After verbal consent patient was treated with HVLA, ME, FPR techniques in cervical, rib, thoracic, lumbar, and sacral  areas  Patient tolerated the procedure well with improvement in symptoms  Patient given exercises, stretches and lifestyle modifications  See medications in patient instructions if given  Patient will follow up  in 4-8 weeks     The above documentation has been reviewed and is accurate and complete Lyndal Pulley, DO         Note: This dictation was prepared with Dragon dictation along with smaller phrase technology. Any transcriptional errors that result from this process are unintentional.

## 2021-09-11 ENCOUNTER — Ambulatory Visit (INDEPENDENT_AMBULATORY_CARE_PROVIDER_SITE_OTHER): Payer: 59 | Admitting: Family Medicine

## 2021-09-11 ENCOUNTER — Encounter: Payer: Self-pay | Admitting: Family Medicine

## 2021-09-11 ENCOUNTER — Ambulatory Visit: Payer: Self-pay

## 2021-09-11 VITALS — BP 118/76 | HR 97 | Ht 62.0 in | Wt 152.0 lb

## 2021-09-11 DIAGNOSIS — M9908 Segmental and somatic dysfunction of rib cage: Secondary | ICD-10-CM | POA: Diagnosis not present

## 2021-09-11 DIAGNOSIS — M9904 Segmental and somatic dysfunction of sacral region: Secondary | ICD-10-CM | POA: Diagnosis not present

## 2021-09-11 DIAGNOSIS — M5416 Radiculopathy, lumbar region: Secondary | ICD-10-CM

## 2021-09-11 DIAGNOSIS — G8929 Other chronic pain: Secondary | ICD-10-CM

## 2021-09-11 DIAGNOSIS — M9903 Segmental and somatic dysfunction of lumbar region: Secondary | ICD-10-CM

## 2021-09-11 DIAGNOSIS — M25512 Pain in left shoulder: Secondary | ICD-10-CM | POA: Diagnosis not present

## 2021-09-11 DIAGNOSIS — M222X1 Patellofemoral disorders, right knee: Secondary | ICD-10-CM | POA: Diagnosis not present

## 2021-09-11 DIAGNOSIS — M9902 Segmental and somatic dysfunction of thoracic region: Secondary | ICD-10-CM | POA: Diagnosis not present

## 2021-09-11 DIAGNOSIS — S4352XA Sprain of left acromioclavicular joint, initial encounter: Secondary | ICD-10-CM | POA: Diagnosis not present

## 2021-09-11 DIAGNOSIS — M9901 Segmental and somatic dysfunction of cervical region: Secondary | ICD-10-CM | POA: Diagnosis not present

## 2021-09-11 NOTE — Assessment & Plan Note (Signed)
Patient was given injection last time and has complete resolution of the symptoms.

## 2021-09-11 NOTE — Patient Instructions (Signed)
R tru pull lite PF exercises See me in 6-8 weeks

## 2021-09-11 NOTE — Assessment & Plan Note (Signed)
Symptoms at this time.  Is responding extremely well though to osteopathic patient.  Discussed exercises.  Increase activity slowly Celebrex for breakthrough pain and is taking the duloxetine.  Follow-up again in 6-8 weeks

## 2021-09-20 ENCOUNTER — Other Ambulatory Visit (HOSPITAL_COMMUNITY): Payer: Self-pay

## 2021-09-20 ENCOUNTER — Other Ambulatory Visit: Payer: Self-pay | Admitting: Family Medicine

## 2021-09-20 MED ORDER — VITAMIN D (ERGOCALCIFEROL) 1.25 MG (50000 UNIT) PO CAPS
50000.0000 [IU] | ORAL_CAPSULE | ORAL | 0 refills | Status: DC
  Filled 2021-09-20: qty 12, 84d supply, fill #0

## 2021-09-24 DIAGNOSIS — L570 Actinic keratosis: Secondary | ICD-10-CM | POA: Diagnosis not present

## 2021-09-24 DIAGNOSIS — D225 Melanocytic nevi of trunk: Secondary | ICD-10-CM | POA: Diagnosis not present

## 2021-09-24 DIAGNOSIS — L820 Inflamed seborrheic keratosis: Secondary | ICD-10-CM | POA: Diagnosis not present

## 2021-09-24 DIAGNOSIS — B078 Other viral warts: Secondary | ICD-10-CM | POA: Diagnosis not present

## 2021-09-24 DIAGNOSIS — L821 Other seborrheic keratosis: Secondary | ICD-10-CM | POA: Diagnosis not present

## 2021-09-24 DIAGNOSIS — D1801 Hemangioma of skin and subcutaneous tissue: Secondary | ICD-10-CM | POA: Diagnosis not present

## 2021-09-24 DIAGNOSIS — L814 Other melanin hyperpigmentation: Secondary | ICD-10-CM | POA: Diagnosis not present

## 2021-09-25 ENCOUNTER — Other Ambulatory Visit (HOSPITAL_COMMUNITY): Payer: Self-pay

## 2021-09-27 ENCOUNTER — Other Ambulatory Visit (HOSPITAL_COMMUNITY): Payer: Self-pay

## 2021-10-03 ENCOUNTER — Other Ambulatory Visit (HOSPITAL_COMMUNITY): Payer: Self-pay

## 2021-10-05 ENCOUNTER — Other Ambulatory Visit (HOSPITAL_COMMUNITY): Payer: Self-pay

## 2021-10-08 ENCOUNTER — Other Ambulatory Visit (HOSPITAL_COMMUNITY): Payer: Self-pay

## 2021-10-08 MED ORDER — HUMIRA (2 SYRINGE) 40 MG/0.4ML ~~LOC~~ PSKT
0.4000 mL | PREFILLED_SYRINGE | SUBCUTANEOUS | 0 refills | Status: DC
Start: 2021-10-08 — End: 2021-10-17
  Filled 2021-10-17: qty 2, 28d supply, fill #0

## 2021-10-11 ENCOUNTER — Other Ambulatory Visit (HOSPITAL_COMMUNITY): Payer: Self-pay

## 2021-10-11 ENCOUNTER — Telehealth: Payer: Self-pay | Admitting: Gastroenterology

## 2021-10-11 NOTE — Telephone Encounter (Signed)
We received previous GI records called patient to advise and ask the reason for transfer and if she has a specific provider left voicemail.

## 2021-10-12 NOTE — Telephone Encounter (Signed)
Good Morning Dr. Havery Moros,  We received records on this patient to transfer her care from [facility] health to [place]. She is requesting this transfer because she has been living with chron's for 10 years and she is finally feeling better and feels she doesn't need to be seen at a large facility. She would like something local and she was referred to you.  We have records for review, please advise on scheduling. Thank you.

## 2021-10-14 HISTORY — PX: UPPER ENDOSCOPY W/ ESOPHAGEAL MANOMETRY: SHX2604

## 2021-10-15 ENCOUNTER — Ambulatory Visit (HOSPITAL_BASED_OUTPATIENT_CLINIC_OR_DEPARTMENT_OTHER)
Admission: RE | Admit: 2021-10-15 | Discharge: 2021-10-15 | Disposition: A | Discharge status: Home / Self Care | Payer: 59 | Source: Ambulatory Visit | Attending: Internal Medicine | Admitting: Internal Medicine

## 2021-10-15 ENCOUNTER — Encounter (HOSPITAL_BASED_OUTPATIENT_CLINIC_OR_DEPARTMENT_OTHER): Payer: Self-pay

## 2021-10-15 ENCOUNTER — Telehealth: Payer: Self-pay | Admitting: Internal Medicine

## 2021-10-15 DIAGNOSIS — R911 Solitary pulmonary nodule: Secondary | ICD-10-CM

## 2021-10-15 DIAGNOSIS — E7800 Pure hypercholesterolemia, unspecified: Secondary | ICD-10-CM | POA: Insufficient documentation

## 2021-10-15 NOTE — Telephone Encounter (Signed)
Incoming call from Radiology regarding recent Coronary calcium scoring with CT.  Coronary calcium score was 0 but CT shows part solid RLL nodule 2.7 x 2.5 x 2.5 cm  with solid component 1.2 x 0.7  x 1.1 cm. Concern for possible adenocarcinoma and PET recommended. Called patient and her husband who is a Film/video editor. We will make referral to Dr. Valeta Harms.

## 2021-10-17 ENCOUNTER — Ambulatory Visit: Payer: 59 | Attending: Internal Medicine | Admitting: Pharmacist

## 2021-10-17 ENCOUNTER — Other Ambulatory Visit (HOSPITAL_COMMUNITY): Payer: Self-pay

## 2021-10-17 ENCOUNTER — Encounter: Payer: Self-pay | Admitting: Pulmonary Disease

## 2021-10-17 ENCOUNTER — Ambulatory Visit (INDEPENDENT_AMBULATORY_CARE_PROVIDER_SITE_OTHER): Payer: 59 | Admitting: Pulmonary Disease

## 2021-10-17 VITALS — BP 110/70 | HR 61 | Ht 62.0 in | Wt 152.8 lb

## 2021-10-17 DIAGNOSIS — Z79899 Other long term (current) drug therapy: Secondary | ICD-10-CM

## 2021-10-17 DIAGNOSIS — Z789 Other specified health status: Secondary | ICD-10-CM

## 2021-10-17 DIAGNOSIS — R911 Solitary pulmonary nodule: Secondary | ICD-10-CM

## 2021-10-17 DIAGNOSIS — Z23 Encounter for immunization: Secondary | ICD-10-CM

## 2021-10-17 MED ORDER — HUMIRA (2 SYRINGE) 40 MG/0.4ML ~~LOC~~ PSKT
0.4000 mL | PREFILLED_SYRINGE | SUBCUTANEOUS | 0 refills | Status: DC
  Filled 2021-10-17 – 2021-10-20 (×2): qty 2, fill #0
  Filled 2021-10-24: qty 2, 28d supply, fill #0

## 2021-10-17 NOTE — Progress Notes (Signed)
Synopsis: Referred in Oct 2023 for multiple pulmonary nodules by Elby Showers, MD  Subjective:   PATIENT ID: [name] GENDER: female DOB: [birth year], MRN: [id number]  Chief Complaint  Patient presents with   Consult    Lung nodule    This is a 61 year old female, past medical history of anxiety, arthritis, asthma, Crohn's disease, sleep apnea on CPAP.  Lifelong non-smoker.Patient had a CT scan of the chest for cardiac scoring completed on 10/15/2021.  This revealed a part solid right lower lobe 2.7 cm diameter nodule with a 1.2 cm solid component suspicious morphology concerning for adenocarcinoma.  Patient was referred for evaluation of pulmonary nodule neck steps for consideration for biopsy and/or surgery.    Past Medical History:  Diagnosis Date   Anxiety    Allergy induced asthma   Arthritis    Asthma    Crohn disease ([place])    Depression    GERD (gastroesophageal reflux disease)    History of methicillin resistant staphylococcus aureus (MRSA)    Sleep apnea    CPAP     Family History  Problem Relation Age of Onset   Alcohol abuse Mother    Alcohol abuse Sister    Angioedema Neg Hx    Allergic rhinitis Neg Hx    Asthma Neg Hx    Atopy Neg Hx    Eczema Neg Hx    Immunodeficiency Neg Hx    Urticaria Neg Hx      Past Surgical History:  Procedure Laterality Date   ANAL FISSURE REPAIR  2004   CESAREAN SECTION     INSERTION OF MESH N/A 07/26/2020   Procedure: INSERTION OF MESH;  Surgeon: Ralene Ok, MD;  Location: [facility];  Service: General;  Laterality: N/A;   LIGAMENT REPAIR Right 11/23/2015   Procedure: right index radial collateral LIGAMENT REPAIR;  Surgeon: Leanora Cover, MD;  Location: [facility];  Service: Orthopedics;  Laterality: Right;   VENTRAL HERNIA REPAIR N/A 07/26/2020   Procedure: LAPAROSCOPIC VENTRAL HERNIA REPAIR WITH MESH;  Surgeon: Ralene Ok, MD;  Location: [facility];  Service: General;  Laterality: N/A;    Social  History   Socioeconomic History   Marital status: Married    Spouse name: Not on file   Number of children: Not on file   Years of education: Not on file   Highest education level: Not on file  Occupational History   Not on file  Tobacco Use   Smoking status: Never   Smokeless tobacco: Never  Vaping Use   Vaping Use: Never used  Substance and Sexual Activity   Alcohol use: Yes    Comment: 10 glasses    Drug use: No   Sexual activity: Yes    Birth control/protection: Pill  Other Topics Concern   Not on file  Social History Narrative   Not on file   Social Determinants of Health   Financial Resource Strain: Not on file  Food Insecurity: Not on file  Transportation Needs: Not on file  Physical Activity: Not on file  Stress: Not on file  Social Connections: Not on file  Intimate Partner Violence: Not on file     Allergies  Allergen Reactions   Chlorhexidine Gluconate Itching   Gabapentin Itching   Hydrocodone Itching     Outpatient Medications Prior to Visit  Medication Sig Dispense Refill   cetirizine (ZYRTEC) 10 MG tablet Take 10 mg by mouth in the morning.     fluticasone (FLONASE)  50 MCG/ACT nasal spray Place 2 sprays into both nostrils every evening. 16 g 2   acetaminophen (TYLENOL) 500 MG tablet Take 500-1,000 mg by mouth every 6 (six) hours as needed (pain).     Adalimumab (HUMIRA) 40 MG/0.4ML PSKT Inject 0.4 mLs (40 mg total) under the skin every 14 (fourteen) days. 2 each 0   albuterol (VENTOLIN HFA) 108 (90 Base) MCG/ACT inhaler Inhale 2 puffs into the lungs every 6 (six) hours as needed for wheezing or shortness of breath. 8.5 g 3   bimatoprost (LUMIGAN) 0.01 % SOLN Instill 1 drop into both eyes at bedtime 2.5 mL 6   busPIRone (BUSPAR) 7.5 MG tablet Take 1 tablet (7.5 mg total) by mouth 2 (two) times daily. 180 tablet 2   celecoxib (CELEBREX) 100 MG capsule Take 1 capsule (100 mg total) by mouth in the morning and at bedtime. 60 capsule 11   DULoxetine  (CYMBALTA) 60 MG capsule Take 1 capsule (60 mg total) by mouth daily. 90 capsule 1   estradiol (VIVELLE-DOT) 0.05 MG/24HR patch Apply 1 patch twice a week by transdermal route 26 patch 3   famotidine (PEPCID) 20 MG tablet Take 20 mg by mouth in the morning.     fluocinonide ointment (LIDEX) 8.67 % Apply 1 application topically daily as needed (irritation).     mupirocin ointment (BACTROBAN) 2 % Apply 1 application topically 2 (two) times daily. 22 g 3   predniSONE (DELTASONE) 20 MG tablet Take 2 tablets (40 mg total) by mouth daily with breakfast. (Patient not taking: Reported on 10/17/2021) 10 tablet 0   progesterone (PROMETRIUM) 100 MG capsule Take 1 capsule (100 mg total) by mouth daily. 90 capsule 3   tiZANidine (ZANAFLEX) 2 MG tablet Take 1 tablet (2 mg total) by mouth at bedtime. 30 tablet 0   Vitamin D, Ergocalciferol, (DRISDOL) 1.25 MG (50000 UNIT) CAPS capsule Take 1 capsule (50,000 Units total) by mouth every 7 (seven) days. 12 capsule 0   LUMIGAN 0.01 % SOLN Place 1 drop into both eyes at bedtime.  11   No facility-administered medications prior to visit.    Review of Systems  Constitutional:  Negative for chills, fever, malaise/fatigue and weight loss.  HENT:  Negative for hearing loss, sore throat and tinnitus.   Eyes:  Negative for blurred vision and double vision.  Respiratory:  Negative for cough, hemoptysis, sputum production, shortness of breath, wheezing and stridor.   Cardiovascular:  Negative for chest pain, palpitations, orthopnea, leg swelling and PND.  Gastrointestinal:  Negative for abdominal pain, constipation, diarrhea, heartburn, nausea and vomiting.  Genitourinary:  Negative for dysuria, hematuria and urgency.  Musculoskeletal:  Negative for joint pain and myalgias.  Skin:  Negative for itching and rash.  Neurological:  Negative for dizziness, tingling, weakness and headaches.  Endo/Heme/Allergies:  Negative for environmental allergies. Does not bruise/bleed  easily.  Psychiatric/Behavioral:  Negative for depression. The patient is not nervous/anxious and does not have insomnia.   All other systems reviewed and are negative.    Objective:  Physical Exam Vitals reviewed.  Constitutional:      General: She is not in acute distress.    Appearance: She is well-developed.  HENT:     Head: Normocephalic and atraumatic.  Eyes:     General: No scleral icterus.    Conjunctiva/sclera: Conjunctivae normal.     Pupils: Pupils are equal, round, and reactive to light.  Neck:     Vascular: No JVD.     Trachea: No  tracheal deviation.  Cardiovascular:     Rate and Rhythm: Normal rate and regular rhythm.     Heart sounds: Normal heart sounds. No murmur heard. Pulmonary:     Effort: Pulmonary effort is normal. No tachypnea, accessory muscle usage or respiratory distress.     Breath sounds: No stridor. No wheezing, rhonchi or rales.  Abdominal:     General: There is no distension.     Palpations: Abdomen is soft.     Tenderness: There is no abdominal tenderness.  Musculoskeletal:        General: No tenderness.     Cervical back: Neck supple.  Lymphadenopathy:     Cervical: No cervical adenopathy.  Skin:    General: Skin is warm and dry.     Capillary Refill: Capillary refill takes less than 2 seconds.     Findings: No rash.  Neurological:     Mental Status: She is alert and oriented to person, place, and time.  Psychiatric:        Behavior: Behavior normal.      Vitals:   10/17/21 1544  BP: 110/70  Pulse: 61  SpO2: 96%  Weight: 152 lb 12.8 oz (69.3 kg)  Height: 5' 2"  (1.575 m)   96% on RA BMI Readings from Last 3 Encounters:  10/17/21 27.95 kg/m  09/11/21 27.80 kg/m  09/04/21 28.09 kg/m   Wt Readings from Last 3 Encounters:  10/17/21 152 lb 12.8 oz (69.3 kg)  09/11/21 152 lb (68.9 kg)  09/04/21 154 lb 12.8 oz (70.2 kg)     CBC    Component Value Date/Time   WBC 6.1 08/30/2021 0939   RBC 4.29 08/30/2021 0939   HGB  13.3 08/30/2021 0939   HCT 40.0 08/30/2021 0939   PLT 279 08/30/2021 0939   MCV 93.2 08/30/2021 0939   MCH 31.0 08/30/2021 0939   MCHC 33.3 08/30/2021 0939   RDW 12.6 08/30/2021 0939   LYMPHSABS 2,092 08/30/2021 0939   MONOABS 0.5 02/11/2018 0951   EOSABS 451 08/30/2021 0939   BASOSABS 98 08/30/2021 0939     Chest Imaging: October 2023 coronary calcium scoring CT: 2.7 mm groundglass subsolid lesion with a 1.2 cm solid component concerning for a adenocarcinoma of the lung. The patient's images have been independently reviewed by me.    Pulmonary Functions Testing Results:     No data to display          FeNO:   Pathology:   Echocardiogram:   Heart Catheterization:     Assessment & Plan:     ICD-10-CM   1. Nodule of lower lobe of right lung  R91.1 NM PET Image Initial (PI) Skull Base To Thigh (F-18 FDG)    Ambulatory referral to Cardiothoracic Surgery    Pulmonary Function Test    Pulmonary Function Test    2. Left upper lobe pulmonary nodule  R91.1 NM PET Image Initial (PI) Skull Base To Thigh (F-18 FDG)    Ambulatory referral to Cardiothoracic Surgery    Pulmonary Function Test    3. Left lower lobe pulmonary nodule  R91.1 NM PET Image Initial (PI) Skull Base To Thigh (F-18 FDG)    Ambulatory referral to Cardiothoracic Surgery    Pulmonary Function Test    4. Non-smoker  Z78.9     5. Need for immunization against influenza  Z23 Flu Vaccine QUAD 52moIM (Fluarix, Fluzone & Alfiuria Quad PF)      Discussion:  This is a 61year old female, non-smoker found incidentally  to have a 2.7 cm subsolid nodule within the right lower lobe concerning for malignancy.  She also has 2 other smaller groundglass focuses.  She has a lesion in the lingula as well as a small nodule within the left lower lobe.  Plan: For step I think she needs a nuclear medicine PET scan and we need to see the rest of the apices of the lung on imaging. If there is more than 1 lesion of concern  then I think we need to move forward with tissue biopsy first. The most dominant lesion in the right lower lobe is concerning for malignancy. However as a non-smoker we could be dealing with multifocal adenocarcinoma in which biopsy confirmation of the smaller lesions as well as the more dominant lesion in the right chest may be prudent. We will also obtain full pulmonary function tests to evaluate surgical candidacy which I expect will be normal as she is lifelong non-smoker. I suspect we will probably move forward with plans for consideration of bronchoscopy  PET scan to be scheduled on 10/29/2021. Pending upon the PET scan we will likely move forward with biopsy.    Current Outpatient Medications:    cetirizine (ZYRTEC) 10 MG tablet, Take 10 mg by mouth in the morning., Disp: , Rfl:    fluticasone (FLONASE) 50 MCG/ACT nasal spray, Place 2 sprays into both nostrils every evening., Disp: 16 g, Rfl: 2   acetaminophen (TYLENOL) 500 MG tablet, Take 500-1,000 mg by mouth every 6 (six) hours as needed (pain)., Disp: , Rfl:    Adalimumab (HUMIRA) 40 MG/0.4ML PSKT, Inject 0.4 mLs (40 mg total) under the skin every 14 (fourteen) days., Disp: 2 each, Rfl: 0   albuterol (VENTOLIN HFA) 108 (90 Base) MCG/ACT inhaler, Inhale 2 puffs into the lungs every 6 (six) hours as needed for wheezing or shortness of breath., Disp: 8.5 g, Rfl: 3   bimatoprost (LUMIGAN) 0.01 % SOLN, Instill 1 drop into both eyes at bedtime, Disp: 2.5 mL, Rfl: 6   busPIRone (BUSPAR) 7.5 MG tablet, Take 1 tablet (7.5 mg total) by mouth 2 (two) times daily., Disp: 180 tablet, Rfl: 2   celecoxib (CELEBREX) 100 MG capsule, Take 1 capsule (100 mg total) by mouth in the morning and at bedtime., Disp: 60 capsule, Rfl: 11   DULoxetine (CYMBALTA) 60 MG capsule, Take 1 capsule (60 mg total) by mouth daily., Disp: 90 capsule, Rfl: 1   estradiol (VIVELLE-DOT) 0.05 MG/24HR patch, Apply 1 patch twice a week by transdermal route, Disp: 26 patch, Rfl:  3   famotidine (PEPCID) 20 MG tablet, Take 20 mg by mouth in the morning., Disp: , Rfl:    fluocinonide ointment (LIDEX) 5.49 %, Apply 1 application topically daily as needed (irritation)., Disp: , Rfl:    mupirocin ointment (BACTROBAN) 2 %, Apply 1 application topically 2 (two) times daily., Disp: 22 g, Rfl: 3   predniSONE (DELTASONE) 20 MG tablet, Take 2 tablets (40 mg total) by mouth daily with breakfast. (Patient not taking: Reported on 10/17/2021), Disp: 10 tablet, Rfl: 0   progesterone (PROMETRIUM) 100 MG capsule, Take 1 capsule (100 mg total) by mouth daily., Disp: 90 capsule, Rfl: 3   tiZANidine (ZANAFLEX) 2 MG tablet, Take 1 tablet (2 mg total) by mouth at bedtime., Disp: 30 tablet, Rfl: 0   Vitamin D, Ergocalciferol, (DRISDOL) 1.25 MG (50000 UNIT) CAPS capsule, Take 1 capsule (50,000 Units total) by mouth every 7 (seven) days., Disp: 12 capsule, Rfl: 0  I spent 62 minutes  dedicated to the care of this patient on the date of this encounter to include pre-visit review of records, face-to-face time with the patient discussing conditions above, post visit ordering of testing, clinical documentation with the electronic health record, making appropriate referrals as documented, and communicating necessary findings to members of the patients care team.     [name], [facility] 10/17/2021 5:02 PM

## 2021-10-17 NOTE — Progress Notes (Signed)
S: Patient presents today for review of her Humira.  Patient is currently taking Humira for Crohn's Disease. Patient has been managed by Dr. Kathlen Mody in the past but is transferring care to Dr. Havery Moros.   Adherence: denies any missed doses of Humira.    Dosing:  40 mg every 14 days.   Screening: TB test: completed prior to initiation  Hepatitis: completed prior to initiation  Efficacy: reports that it is working very well for her and that she has not really had any side effects  Monitoring: S/sx of infection: denies CBC: last one WNL (08/30/2021) S/sx of hypersensitivity: denies S/sx of malignancy: Of note, she had a cardiac CT done 10/15/21 that revealed a part solid right lower lobe nodule measuring 2.7 by 2.5 by 2.5 cm, solid component measuring 1.2 by 0.7 by 1.1 cm. This is being investigated for possible adenocarcinoma.  S/sx of heart failure: denies  O:  Lab Results  Component Value Date   WBC 6.1 08/30/2021   HGB 13.3 08/30/2021   HCT 40.0 08/30/2021   MCV 93.2 08/30/2021   PLT 279 08/30/2021      Chemistry      Component Value Date/Time   NA 140 08/30/2021 0939   K 4.4 08/30/2021 0939   CL 105 08/30/2021 0939   CO2 29 08/30/2021 0939   BUN 13 08/30/2021 0939   CREATININE 0.74 08/30/2021 0939      Component Value Date/Time   CALCIUM 9.8 08/30/2021 0939   ALKPHOS 31 (L) 02/11/2018 0951   AST 24 08/30/2021 0939   ALT 18 08/30/2021 0939   BILITOT 0.8 08/30/2021 0939     A/P: 1. Medication review: Patient reports good efficacy. Labs have remained stable. She is regularly following with gastroenterologist. Of note, she did have questions concerning links between Humira and malignancy. I did go over that Humira is linked most commonly to Lymphoma and other malignancies, some fatal. These are more common in children and adolescent patients treated with TNF blockers including adalimumab. Postmarketing cases of hepatosplenic T-cell lymphoma (HSTCL), a rare type of  T-cell lymphoma, have been reported in patients treated with TNF blockers including adalimumab. Other melanomas and non-melanoma skin cancers have been reported with Humira. Reviewed the medication with the patient.. No recommendations for changes at this time.   Benard Halsted, PharmD, [facility] [place], [place] [phone]

## 2021-10-17 NOTE — Patient Instructions (Addendum)
Thank you for visiting Dr. Valeta Harms at [facility]. Today we recommend the following:  Orders Placed This Encounter  Procedures   NM PET Image Initial (PI) Skull Base To Thigh (F-18 FDG)   Ambulatory referral to Cardiothoracic Surgery   Pulmonary Function Test   I am back in town on the 16th.   Return in about 3 weeks (around 11/07/2021) for w/ Dr. Valeta Harms .    Please do your part to reduce the spread of COVID-19.

## 2021-10-18 ENCOUNTER — Encounter: Payer: Self-pay | Admitting: Pulmonary Disease

## 2021-10-18 ENCOUNTER — Other Ambulatory Visit (HOSPITAL_COMMUNITY): Payer: Self-pay

## 2021-10-18 NOTE — Addendum Note (Signed)
Addended by: [name] on: 10/18/2021 12:12 PM   Modules accepted: Orders

## 2021-10-18 NOTE — Addendum Note (Signed)
Addended by: Alvin Critchley on: 10/18/2021 12:19 PM   Modules accepted: Orders

## 2021-10-19 ENCOUNTER — Encounter: Payer: 59 | Admitting: Thoracic Surgery (Cardiothoracic Vascular Surgery)

## 2021-10-22 ENCOUNTER — Ambulatory Visit (HOSPITAL_COMMUNITY)
Admission: RE | Admit: 2021-10-22 | Discharge: 2021-10-22 | Disposition: A | Discharge status: Home / Self Care | Payer: 59 | Source: Ambulatory Visit | Attending: Pulmonary Disease | Admitting: Pulmonary Disease

## 2021-10-22 ENCOUNTER — Other Ambulatory Visit (HOSPITAL_COMMUNITY): Payer: Self-pay

## 2021-10-22 DIAGNOSIS — R911 Solitary pulmonary nodule: Secondary | ICD-10-CM | POA: Diagnosis not present

## 2021-10-22 LAB — PULMONARY FUNCTION TEST
DL/VA % pred: 89 %
DL/VA: 3.83 ml/min/mmHg/L
DLCO unc % pred: 101 %
DLCO unc: 19.15 ml/min/mmHg
FEF 25-75 Post: 2.21 L/sec
FEF 25-75 Pre: 1.76 L/sec
FEF2575-%Change-Post: 25 %
FEF2575-%Pred-Post: 100 %
FEF2575-%Pred-Pre: 80 %
FEV1-%Change-Post: 3 %
FEV1-%Pred-Post: 113 %
FEV1-%Pred-Pre: 110 %
FEV1-Post: 2.67 L
FEV1-Pre: 2.58 L
FEV1FVC-%Change-Post: 3 %
FEV1FVC-%Pred-Pre: 95 %
FEV6-%Change-Post: 0 %
FEV6-%Pred-Post: 118 %
FEV6-%Pred-Pre: 117 %
FEV6-Post: 3.47 L
FEV6-Pre: 3.44 L
FEV6FVC-%Change-Post: 0 %
FEV6FVC-%Pred-Post: 103 %
FEV6FVC-%Pred-Pre: 102 %
FVC-%Change-Post: 0 %
FVC-%Pred-Post: 115 %
FVC-%Pred-Pre: 114 %
FVC-Post: 3.5 L
FVC-Pre: 3.49 L
Post FEV1/FVC ratio: 76 %
Post FEV6/FVC ratio: 99 %
Pre FEV1/FVC ratio: 74 %
Pre FEV6/FVC Ratio: 99 %
RV % pred: 92 %
RV: 1.77 L
TLC % pred: 114 %
TLC: 5.42 L

## 2021-10-22 MED ORDER — ALBUTEROL SULFATE (2.5 MG/3ML) 0.083% IN NEBU
2.5000 mg | INHALATION_SOLUTION | Freq: Once | RESPIRATORY_TRACT | Status: AC
  Administered 2021-10-22: 2.5 mg via RESPIRATORY_TRACT

## 2021-10-22 NOTE — Telephone Encounter (Signed)
I think I already signed off on paperwork for her case, okay to schedule with me in the office. Thanks

## 2021-10-22 NOTE — Telephone Encounter (Signed)
Patient calling to follow up on transfer of care.

## 2021-10-24 ENCOUNTER — Other Ambulatory Visit (HOSPITAL_COMMUNITY): Payer: Self-pay

## 2021-10-24 NOTE — Progress Notes (Unsigned)
[name] [address] [place] Phone: [phone] Subjective:   [name], am serving as a scribe for Dr. Hulan Saas.  I'm seeing this patient by the request  of:  [name], Cresenciano Lick, MD  CC: Back and neck pain follow-up  MHD:[id number]  [name] is a 61 y.o. female coming in with complaint of back and neck pain. OMT on 09/11/2021. Patient states here for manipulation. Recently found a spot on her lung that has to get biopsied.  Reviewed patient's imaging including the CT coronary where this was found as well as patient's PET scan.  Likely we will be looking at the possibility of further evaluation of a thyroid nodule noted as well.    Medications patient has been prescribed: Vit D  Taking:         Reviewed prior external information including notes and imaging from previsou exam, outside providers and external EMR if available.   As well as notes that were available from care everywhere and other healthcare systems.  Past medical history, social, surgical and family history all reviewed in electronic medical record.  No pertanent information unless stated regarding to the chief complaint.   Past Medical History:  Diagnosis Date   Anxiety    Allergy induced asthma   Arthritis    Asthma    Crohn disease ([place])    Depression    GERD (gastroesophageal reflux disease)    History of methicillin resistant staphylococcus aureus (MRSA)    Sleep apnea    CPAP    Allergies  Allergen Reactions   Chlorhexidine Gluconate Itching   Gabapentin Itching   Hydrocodone Itching   Oxycodone Hcl Itching    NDC JJHE:[id number]  NDC HUDJ:[id number]     Review of Systems:  No headache, visual changes, nausea, vomiting, diarrhea, constipation, dizziness, abdominal pain, skin rash, fevers, chills, night sweats, weight loss, swollen lymph nodes, body aches, joint swelling, chest pain, shortness of breath, mood changes. POSITIVE  muscle aches  Objective  Blood pressure 122/66, pulse 99, height 5' 2"  (1.575 m), weight 152 lb (68.9 kg), last menstrual period 01/10/2014, SpO2 98 %.   General: No apparent distress alert and oriented x3 mood and affect normal, dressed appropriately.  HEENT: Pupils equal, extraocular movements intact  Respiratory: Patient's speak in full sentences and does not appear short of breath  Cardiovascular: No lower extremity edema, non tender, no erythema   Back does have some loss of lordosis.  Some tightness noted in the paraspinal musculature.  Patient does have some tenderness to palpation in the thoracolumbar juncture more than patient's baseline.  Negative straight leg test noted at the moment.  Lacks last 15 degrees  Osteopathic findings  C2 flexed rotated and side bent right C5 flexed rotated and side bent left T3 extended rotated and side bent right inhaled rib T6 extended rotated and side bent left L2 flexed rotated and side bent right Sacrum right on right       Assessment and Plan:  Lumbar radiculopathy Discussed with patient patient did have a PET scan recently that did not show anything that lit up in the back. Do feel at this moment that patient can continue conservative therapy.  Would want patient to hold at least 6 weeks on osteopathic manipulation until after the biopsy.  Patient will follow-up with me at that time to see how she is doing otherwise patient does need to hold on some of her other medications such  as the Celebrex.  Continue the Cymbalta 60 mg daily.  Follow-up with me again in 7-8 weeks     Nonallopathic problems  Decision today to treat with OMT was based on Physical Exam  After verbal consent patient was treated with HVLA, ME, FPR techniques in cervical, rib, thoracic, lumbar, and sacral  areas  Patient tolerated the procedure well with improvement in symptoms  Patient given exercises, stretches and lifestyle modifications  See medications in  patient instructions if given  Patient will follow up in 4-8 weeks             Note: This dictation was prepared with Dragon dictation along with smaller phrase technology. Any transcriptional errors that result from this process are unintentional.

## 2021-10-25 ENCOUNTER — Other Ambulatory Visit (HOSPITAL_COMMUNITY): Payer: Self-pay

## 2021-10-29 ENCOUNTER — Ambulatory Visit (HOSPITAL_COMMUNITY)
Admission: RE | Admit: 2021-10-29 | Discharge: 2021-10-29 | Disposition: A | Discharge status: Home / Self Care | Payer: 59 | Source: Ambulatory Visit | Attending: Pulmonary Disease | Admitting: Pulmonary Disease

## 2021-10-29 DIAGNOSIS — R911 Solitary pulmonary nodule: Secondary | ICD-10-CM

## 2021-10-29 DIAGNOSIS — R918 Other nonspecific abnormal finding of lung field: Secondary | ICD-10-CM | POA: Diagnosis not present

## 2021-10-29 LAB — GLUCOSE, CAPILLARY: Glucose-Capillary: 101 mg/dL — ABNORMAL HIGH (ref 70–99)

## 2021-10-29 MED ORDER — FLUDEOXYGLUCOSE F - 18 (FDG) INJECTION
7.5000 | Freq: Once | INTRAVENOUS | Status: AC | PRN
  Administered 2021-10-29: 7.53 via INTRAVENOUS

## 2021-10-30 ENCOUNTER — Telehealth: Payer: Self-pay

## 2021-10-30 ENCOUNTER — Ambulatory Visit (INDEPENDENT_AMBULATORY_CARE_PROVIDER_SITE_OTHER): Payer: 59 | Admitting: Family Medicine

## 2021-10-30 VITALS — BP 122/66 | HR 99 | Ht 62.0 in | Wt 152.0 lb

## 2021-10-30 DIAGNOSIS — M9901 Segmental and somatic dysfunction of cervical region: Secondary | ICD-10-CM | POA: Diagnosis not present

## 2021-10-30 DIAGNOSIS — M9902 Segmental and somatic dysfunction of thoracic region: Secondary | ICD-10-CM

## 2021-10-30 DIAGNOSIS — M9908 Segmental and somatic dysfunction of rib cage: Secondary | ICD-10-CM | POA: Diagnosis not present

## 2021-10-30 DIAGNOSIS — M9904 Segmental and somatic dysfunction of sacral region: Secondary | ICD-10-CM | POA: Diagnosis not present

## 2021-10-30 DIAGNOSIS — M5416 Radiculopathy, lumbar region: Secondary | ICD-10-CM | POA: Diagnosis not present

## 2021-10-30 DIAGNOSIS — M9903 Segmental and somatic dysfunction of lumbar region: Secondary | ICD-10-CM

## 2021-10-30 NOTE — Telephone Encounter (Addendum)
-----   Message from Yetta Flock, MD sent at 10/30/2021  8:06 AM EDT ----- Regarding: new patient visit coordinating Zadok Holaway this patient has Crohn's disease, she is transitioning her care to [place] from [facility].  Can you contact her and help book an appointment. I could add her on to my schedule this Friday 10/20 at 1130 AM if she can make it then. If not let me know and will find another date.  Her phone is [phone].   Thank you!  Dr. Loni Muse

## 2021-10-30 NOTE — Telephone Encounter (Signed)
Lm on vm for patient to return call. Patient has been scheduled for a new patient appt with Dr. Havery Moros on Friday, 11/02/21 at 11 am.

## 2021-10-30 NOTE — Telephone Encounter (Signed)
Pt called and confirmed appt for Friday.

## 2021-10-30 NOTE — Patient Instructions (Signed)
Good to see you! We are always here if you need [place]

## 2021-10-31 ENCOUNTER — Encounter: Payer: Self-pay | Admitting: Family Medicine

## 2021-10-31 ENCOUNTER — Other Ambulatory Visit (HOSPITAL_COMMUNITY): Payer: Self-pay

## 2021-10-31 ENCOUNTER — Telehealth: Payer: Self-pay | Admitting: Pulmonary Disease

## 2021-10-31 DIAGNOSIS — E041 Nontoxic single thyroid nodule: Secondary | ICD-10-CM

## 2021-10-31 DIAGNOSIS — R911 Solitary pulmonary nodule: Secondary | ICD-10-CM

## 2021-10-31 MED ORDER — BIMATOPROST 0.01 % OP SOLN
1.0000 [drp] | Freq: Every day | OPHTHALMIC | 10 refills | Status: DC
  Filled 2021-10-31: qty 2.5, 25d supply, fill #0
  Filled 2021-11-28: qty 2.5, 25d supply, fill #1
  Filled 2021-12-17: qty 2.5, 25d supply, fill #2
  Filled 2022-02-15: qty 2.5, 25d supply, fill #3
  Filled 2022-04-13 – 2022-04-15 (×2): qty 2.5, 25d supply, fill #4
  Filled 2022-05-23: qty 2.5, 25d supply, fill #5
  Filled 2022-07-15: qty 2.5, 25d supply, fill #6
  Filled 2022-08-04: qty 2.5, 25d supply, fill #7
  Filled 2022-08-30: qty 2.5, 25d supply, fill #8
  Filled 2022-10-18: qty 2.5, 25d supply, fill #9

## 2021-10-31 NOTE — Telephone Encounter (Signed)
I have cancelled bronch for next week.

## 2021-10-31 NOTE — Assessment & Plan Note (Addendum)
Discussed with patient patient did have a PET scan recently that did not show anything that lit up in the back. Do feel at this moment that patient can continue conservative therapy.  Would want patient to hold at least 6 weeks on osteopathic manipulation until after the biopsy.  Patient will follow-up with me at that time to see how she is doing otherwise patient does need to hold on some of her other medications such as the Celebrex.  Continue the Cymbalta 60 mg daily.  Follow-up with me again in 7-8 weeks   Total total time reviewing patient's imaging, reviewing patient's notes as well as discussing with patient and physical exam 40 minutes

## 2021-10-31 NOTE — Telephone Encounter (Signed)
PCCM:  I called and spoke with the patient as well as her husband.   Great news, the RLL lesion is small in size suggestive of an inflammatory lesion.   The thyroid nodule is new. She needs a thyroid US and biopsy.   I have ordered this.   Please CANCEL the bronchoscopy for next week.   Ordered repeat ct chest to be complete in 4-6 weeks from now    [name], [facility] 10/31/2021 12:03 PM

## 2021-11-01 ENCOUNTER — Other Ambulatory Visit: Payer: Self-pay | Admitting: Pulmonary Disease

## 2021-11-01 DIAGNOSIS — E041 Nontoxic single thyroid nodule: Secondary | ICD-10-CM

## 2021-11-01 NOTE — Progress Notes (Unsigned)
Suttle, Rosanne Ashing, MD  Donita Brooks D Please schedule for US Thyroid.   The interpretation will decide if FNA is warranted.   Thank you,   Dylan

## 2021-11-02 ENCOUNTER — Other Ambulatory Visit (HOSPITAL_COMMUNITY): Payer: Self-pay

## 2021-11-02 ENCOUNTER — Encounter: Payer: Self-pay | Admitting: Gastroenterology

## 2021-11-02 ENCOUNTER — Ambulatory Visit (HOSPITAL_COMMUNITY): Payer: 59

## 2021-11-02 ENCOUNTER — Ambulatory Visit (INDEPENDENT_AMBULATORY_CARE_PROVIDER_SITE_OTHER): Payer: 59 | Admitting: Gastroenterology

## 2021-11-02 ENCOUNTER — Other Ambulatory Visit: Payer: 59

## 2021-11-02 VITALS — BP 126/80 | HR 71 | Ht 62.0 in | Wt 153.0 lb

## 2021-11-02 DIAGNOSIS — R948 Abnormal results of function studies of other organs and systems: Secondary | ICD-10-CM

## 2021-11-02 DIAGNOSIS — J029 Acute pharyngitis, unspecified: Secondary | ICD-10-CM

## 2021-11-02 DIAGNOSIS — K219 Gastro-esophageal reflux disease without esophagitis: Secondary | ICD-10-CM | POA: Diagnosis not present

## 2021-11-02 DIAGNOSIS — K5010 Crohn's disease of large intestine without complications: Secondary | ICD-10-CM

## 2021-11-02 DIAGNOSIS — Z79899 Other long term (current) drug therapy: Secondary | ICD-10-CM | POA: Diagnosis not present

## 2021-11-02 DIAGNOSIS — R1310 Dysphagia, unspecified: Secondary | ICD-10-CM | POA: Diagnosis not present

## 2021-11-02 MED ORDER — OMEPRAZOLE 40 MG PO CPDR
40.0000 mg | DELAYED_RELEASE_CAPSULE | Freq: Every day | ORAL | 1 refills | Status: DC
  Filled 2021-11-02: qty 30, 30d supply, fill #0
  Filled 2021-11-23: qty 30, 30d supply, fill #1

## 2021-11-02 NOTE — Patient Instructions (Addendum)
_______________________________________________________  If you are age 61 or older, your body mass index should be between 23-30. Your Body mass index is 27.98 kg/m. If this is out of the aforementioned range listed, please consider follow up with your Primary Care Provider.  If you are age 55 or younger, your body mass index should be between 19-25. Your Body mass index is 27.98 kg/m. If this is out of the aformentioned range listed, please consider follow up with your Primary Care Provider.   ________________________________________________________  The [facility] providers would like to encourage you to use [facility] to communicate with providers for non-urgent requests or questions.  Due to long hold times on the telephone, sending your provider a message by [facility] may be a faster and more efficient way to get a response.  Please allow 48 business hours for a response.  Please remember that this is for non-urgent requests.  _______________________________________________________  Dennis Bast have been scheduled for an endoscopy. Please follow written instructions given to you at your visit today. If you use inhalers (even only as needed), please bring them with you on the day of your procedure.  Please go to the lab in the basement of our building to have lab work done as you leave today. Hit "B" for basement when you get on the elevator.  When the doors open the lab is on your left.  We will call you with the results. Thank you.  We have sent the following medications to your pharmacy for you to pick up at your convenience: Omeprazole 40 mg: Take once daily  Thank you for entrusting me with your care and for choosing [facility], Dr. [place] Cellar

## 2021-11-02 NOTE — Progress Notes (Signed)
HPI :  61 year old female with a history of colonic Crohn's disease, GERD, OSA, here to establish care for Crohn's disease and to have further evaluation of throat discomfort.  She has been followed by [facility] IBD clinic for her Crohn's disease for the past several years.  Crohn's history obtained from the patient and reviewing her extensive records at [facility].  In summary she was diagnosed with Crohn's disease in 2001.  Index symptoms appear to be abdominal pain, loose stools with blood and mucus.  Over the years she has been on a variety of regimens to include Remicade, thiopurine's, Humira, methotrexate, mesalamine, Flagyl.  She has colonic Crohn's involvement with reported history of perianal abscess without fistula in the past.  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  She has been on and off thiopurine's methotrexate in years past.  Currently on Humira every 2 weeks and states she has been on a stable dosing of this for the past few years.  Generally she states her Crohn's has been pretty well controlled on this regimen.  At baseline her bowels are fairly regular, however at times she has occasional loose stools which are sporadic.  No blood in her stool, no mucus.  Her last colonoscopy was performed in December 2021 showing no endoscopic inflammation, biopsies showed no evidence of microscopic colitis.  She had been on Colestid in the past for loose stools, she is not on that currently.  She was told to have another colonoscopy in 12/2022.  Her vaccines for flu, COVID, pneumococcus, Shingrix are all up-to-date.  More recently she had a cardiac CT scan done in early October which had a calcium score of 0.  Incidentally noted was a nodule in her right lower lung concerning for possible malignancy.  She was seen by Dr. Valeta Harms who subsequently ordered a PET scan.  Thought to have a minimally metabolic small nodule in the right lower lobe probably inflammatory, less likely malignant.  She was initially scheduled  for bronchoscopy which was since canceled and recommendation for follow-up interval CT scan.  She incidentally was noted to have hypermetabolic thyroid nodule for which she has been referred for possible biopsy.  She was also noted to have some mild symmetric distal esophageal wall thickening with some uptake, possibly reflecting esophagitis.  There was no uptake or inflammation in her bowel that was noted.  She does have a history of reflux over the years.  She takes Pepcid once every day, despite that she does have some breakthrough reflux that bothers her as well as regurgitation.  She has had longstanding dysphagia for years, mostly to solids which she feels in her throat.  No dysphagia to liquids.  She had a prior barium study in 2006 which suggested dysmotility but no overt evidence of achalasia.  She had an EGD at [facility] in 2017 which was reportedly normal, she also had an EGD many years ago which she thinks was okay.  She has never had an empiric dilation.  Her dysphagia symptoms are at baseline.  More recently over the past month she has had a fairly sore throat that is present most of the time.  Not only with swallowing but appears to be more of a persistent bothersome discomfort.  She had a dry cough associated with this.  She has had a history of oral ulcerations from her Crohn's disease at time of diagnosis but has not had that in a while.  She cannot recall if this feels similarly or not.  She  has not been on any recent antibiotics.  She has not been on any recent PPIs.  She has had some viral infections in recent months.  No weight loss, night sweats etc.  She denies any history of osteoporosis or chronic kidney disease.  She denies any cardiopulmonary symptoms.   IBD HISTORY:  Year of disease onset: 2001 Diagnosis: Crohn's Disease Location: Colonic - Nonstricturing,nonpenetrating   Prior IBD medications  - Flagyl - Asacol - 6-MP - MTX - Remicade - Humira   Endoscopy:  Colon  12/29/19: Skin tags were found on perianal exam. The terminal ileum appeared normal. Normal mucosa was found in the entire colon. Biopsies for histology were  taken with a cold forceps from the right colon and left colon for  evaluation of microscopic colitis. Many small and large-mouthed diverticula were found in the entire colon. Scattered probable pseudopolyps were found in the transverse colon,  ascending colon and cecum. The polyps were 1 to 2 mm in size. These  polyps were removed with a jumbo cold forceps. Resection and retrieval  were complete. Internal hemorrhoids were found during retroflexion.  A: Right colon, biopsy - Colonic mucosa with focal hyperplastic change - No significant active inflammation - No evidence of dysplasia   B: Colon, random, biopsy - Melanosis coli - No significant active inflammation - No evidence of dysplasia    Colon 11/07/17: One 12 mm polyp in the sigmoid colon, removed with a hot snare. Resected and retrieved. Pseudopolyps at the hepatic flexure. Biopsied. Erythematous mucosa in the sigmoid colon c/w scar. Biopsied. No active disease seen. Moderate diverticulosis in the sigmoid colon, in the descending colon, in the transverse colon and in the ascending colon. There was no evidence of diverticular bleeding.  A: Colon, transverse, biopsy - Sessile serrated polyp (2 fragments); negative for dysplasia - Lymphoid nodule (1 fragment) - No CMV viral cytopathic effect or granuloma identified   B: Colon, sigmoid, biopsy - Unremarkable colonic mucosa (2 fragments) - No CMV viral cytopathic effect, granuloma, or dysplasia identified  EGD 07/12/15: Normal esophagus, stomach, and duodenum.   Colon 07/12/15: Pseudopolyps in the transverse colon. Resected and retrieved. Diverticulosis in the sigmoid colon, in the descending colon, in the transverse colon and in the ascending colon. Increased mucosa vascular pattern in the rectum. Biopsied.  A: Colon,  ascending, transverse, biopsy - Colonic mucosa with mild hyperplastic epithelial changes, negative for dysplasia - No active inflammation, CMV cytopathic effect or granulomas identified   B: Colon, random, left, biopsy - Colonic mucosa with no specific pathologic abnormality, negative for dysplasia - No significant crypt architectural distortion or active inflammation seen - No CMV cytopathic effect or granulomas identified  Colon 12/17/10:  One 12 mm polyp in the rectum. Resected and retrieved. Erythematous mucosa in the sigmoid colon and in the descending colon. The splenic flexure, transverse colon, hepatic flexure, ascending colon, cecum and appendiceal orifice are normal with the exception of a few scattered diverticulae. Two biopsies were taken every 10 cm from the right colon and left colon.   A:  Colon, sigmoid and rectum, biopsy  -  Inflammatory pseudopolyp arising in chronic inflammatory bowel disease with  focal low grade dysplasia (see comment)   B:  Colon, right, biopsy  -  No significant pathologic abnormality  -  No inflammatory bowel disease or dysplasia identified   C:  Colon, left, biopsy  -  No significant pathologic abnormality  -  No inflammatory bowel disease or dysplasia identified  PET scan 10/29/21: IMPRESSION: 1. Minimally metabolic 17 mm mixed density right lower lobe pulmonary nodule may reflect an infectious or inflammatory etiology, but given its morphology a very low-grade primary bronchogenic adenocarcinoma remains a pertinent differential consideration. Consider referral to multidisciplinary tumor board and dedicated chest CT surveillance imaging or versus direct tissue sampling. 2. Hypermetabolic heterogeneous 19 mm right thyroid nodule, suggest further evaluation with thyroid ultrasound and FNA. 3. Hypermetabolic mild symmetric distal esophageal wall thickening with a max SUV of 4.3, possibly reflecting esophagitis. Consider further evaluation  with endoscopy. 4. Hypermetabolic hyperplasia of the tonsils slightly asymmetric to the left, nonspecific but commonly reactive. Consider correlation with direct visualization. 5. Focus of hypermetabolic activity about the left acromioclavicular joint without underlying osseous lesion identified, favored inflammatory. 6. Minimally metabolic right posterior gluteal soft tissue nodularity likely reflects sequela of subcutaneous injections or prior trauma. 7. Colonic diverticulosis without findings of acute diverticulitis. 8. Nodular uterine contour likely reflects leiomyomas.   Past Medical History:  Diagnosis Date   Anxiety    Allergy induced asthma   Arthritis    Asthma    Crohn disease ([place])    Depression    GERD (gastroesophageal reflux disease)    History of methicillin resistant staphylococcus aureus (MRSA)    Sleep apnea    CPAP     Past Surgical History:  Procedure Laterality Date   ANAL FISSURE REPAIR  2004   CESAREAN SECTION     INSERTION OF MESH N/A 07/26/2020   Procedure: INSERTION OF MESH;  Surgeon: Ralene Ok, MD;  Location: [place];  Service: General;  Laterality: N/A;   LIGAMENT REPAIR Right 11/23/2015   Procedure: right index radial collateral LIGAMENT REPAIR;  Surgeon: Leanora Cover, MD;  Location: [place];  Service: Orthopedics;  Laterality: Right;   VENTRAL HERNIA REPAIR N/A 07/26/2020   Procedure: LAPAROSCOPIC VENTRAL HERNIA REPAIR WITH MESH;  Surgeon: Ralene Ok, MD;  Location: [place];  Service: General;  Laterality: N/A;   Family History  Problem Relation Age of Onset   Alcohol abuse Mother    Alcohol abuse Sister    Angioedema Neg Hx    Allergic rhinitis Neg Hx    Asthma Neg Hx    Atopy Neg Hx    Eczema Neg Hx    Immunodeficiency Neg Hx    Urticaria Neg Hx    Colon cancer Neg Hx    Stomach cancer Neg Hx    Esophageal cancer Neg Hx    Colon polyps Neg Hx    Social History   Tobacco Use   Smoking status: Never    Smokeless tobacco: Never  Vaping Use   Vaping Use: Never used  Substance Use Topics   Alcohol use: Yes    Comment: 10 glasses    Drug use: No   Current Outpatient Medications  Medication Sig Dispense Refill   acetaminophen (TYLENOL) 500 MG tablet Take 500-1,000 mg by mouth every 6 (six) hours as needed (pain).     Adalimumab (HUMIRA) 40 MG/0.4ML PSKT Inject 0.4 mLs (40 mg total) under the skin every 14 (fourteen) days. 2 each 0   albuterol (VENTOLIN HFA) 108 (90 Base) MCG/ACT inhaler Inhale 2 puffs into the lungs every 6 (six) hours as needed for wheezing or shortness of breath. 8.5 g 3   ascorbic acid (VITAMIN C) 500 MG tablet Take 500 mg by mouth in the morning.     bimatoprost (LUMIGAN) 0.01 % SOLN Instill 1 drop into both eyes at bedtime  2.5 mL 10   busPIRone (BUSPAR) 7.5 MG tablet Take 1 tablet (7.5 mg total) by mouth 2 (two) times daily. 180 tablet 2   celecoxib (CELEBREX) 100 MG capsule Take 1 capsule (100 mg total) by mouth in the morning and at bedtime. (Patient taking differently: Take 100 mg by mouth in the morning.) 60 capsule 11   cetirizine (ZYRTEC) 10 MG tablet Take 10 mg by mouth in the morning.     DULoxetine (CYMBALTA) 60 MG capsule Take 1 capsule (60 mg total) by mouth daily. 90 capsule 1   estradiol (VIVELLE-DOT) 0.05 MG/24HR patch Apply 1 patch twice a week by transdermal route (Patient taking differently: Place 1 patch onto the skin 2 (two) times a week. On Saturdays & Tuesdays.) 26 patch 3   famotidine (PEPCID) 20 MG tablet Take 20 mg by mouth in the morning.     fluocinonide ointment (LIDEX) 3.30 % Apply 1 application topically daily as needed (irritation).     mupirocin ointment (BACTROBAN) 2 % Apply 1 application topically 2 (two) times daily. (Patient taking differently: Apply 1 application  topically 2 (two) times daily as needed (irritation).) 22 g 3   progesterone (PROMETRIUM) 100 MG capsule Take 1 capsule (100 mg total) by mouth daily. (Patient taking  differently: Take 100 mg by mouth every evening.) 90 capsule 3   Vitamin D, Ergocalciferol, (DRISDOL) 1.25 MG (50000 UNIT) CAPS capsule Take 1 capsule (50,000 Units total) by mouth every 7 (seven) days. (Patient taking differently: Take 50,000 Units by mouth every Thursday.) 12 capsule 0   fluticasone (FLONASE) 50 MCG/ACT nasal spray Place 2 sprays into both nostrils every evening. (Patient not taking: Reported on 11/02/2021) 16 g 2   No current facility-administered medications for this visit.   Allergies  Allergen Reactions   Chlorhexidine Gluconate Itching   Gabapentin Itching   Hydrocodone Itching   Oxycodone Hcl Itching    NDC QTMA:[id number]  NDC WLSL:[id number]     Review of Systems: All systems reviewed and negative except where noted in HPI.    CT Super D Chest Wo Contrast  Result Date: 10/30/2021 CLINICAL DATA:  Evaluate lung nodules.  * Tracking Code: BO * EXAM: CT CHEST WITHOUT CONTRAST TECHNIQUE: Multidetector CT imaging of the chest was performed using thin slice collimation for electromagnetic bronchoscopy planning purposes, without intravenous contrast. RADIATION DOSE REDUCTION: This exam was performed according to the departmental dose-optimization program which includes automated exposure control, adjustment of the mA and/or kV according to patient size and/or use of iterative reconstruction technique. COMPARISON:  PET-CT October 29, 2021 and CT October 15, 2021. FINDINGS: Cardiovascular: Normal caliber thoracic aorta. Normal size heart. No significant pericardial effusion/thickening. Mediastinum/Nodes: No supraclavicular adenopathy. 19 mm nodule in the right lobe of the thyroid was hypermetabolic on recent PET-CT. No pathologically enlarged mediastinal, hilar or axillary lymph nodes, noting limited sensitivity for the detection of hilar adenopathy on this noncontrast study. Symmetric distal esophageal wall thickening with mildly metabolic on recent prior PET-CT.  Lungs/Pleura: Mixed density lesion in the right lower lobe measures 15 x 15 mm with a 6 mm solid component previously measuring 24 x 24 mm with a 12 mm solid component when remeasured for consistency. This was mildly metabolic on recent prior PET-CT. No new suspicious pulmonary nodules or masses. No pleural effusion. No pneumothorax. Upper Abdomen: No acute abnormality. Musculoskeletal: Benign fibroadenoma in the medial right breast. No aggressive lytic or blastic lesion of bone. IMPRESSION: 1. Decreased size of 15 mm mixed density right  lower lobe pulmonary nodule which was mildly metabolic on recent PET-CT. Findings favored to reflect a resolving infectious/inflammatory process. However, follow-up imaging to resolution is suggested. 2. No new suspicious pulmonary nodules or masses. 3. Symmetric distal esophageal wall thickening with mildly metabolic on recent prior PET-CT, suggestive of esophagitis. Consider further evaluation with upper endoscopy. 4. 19 mm nodule in the right lobe of the thyroid was hypermetabolic on recent PET-CT. Suggest further evaluation with thyroid ultrasound and FNA. Electronically Signed   By: Dahlia Bailiff M.D.   On: 10/30/2021 15:38   NM PET Image Initial (PI) Skull Base To Thigh (F-18 FDG)  Result Date: 10/29/2021 CLINICAL DATA:  Initial treatment strategy for pulmonary nodule. EXAM: NUCLEAR MEDICINE PET SKULL BASE TO THIGH TECHNIQUE: 7.53 mCi F-18 FDG was injected intravenously. Full-ring PET imaging was performed from the skull base to thigh after the radiotracer. CT data was obtained and used for attenuation correction and anatomic localization. Fasting blood glucose: 101 mg/dl COMPARISON:  CT October 15, 2021 FINDINGS: Mediastinal blood pool activity: SUV max 2.01 Liver activity: SUV max NA NECK: Hypermetabolic hyperplasia of the tonsils slightly asymmetric to the left with a max SUV of 6.2 on the left. Hypermetabolic heterogeneous right thyroid nodule measures 19 mm on image  46/4 with a max SUV of 43.3 Incidental CT findings: None. CHEST: Minimally metabolic mixed density right lower lobe pulmonary nodule measures 17 mm on image 43/7 with a max SUV of 1.7. No hypermetabolic thoracic adenopathy. Hypermetabolic mild symmetric distal esophageal wall thickening with a max SUV of 4.3. Incidental CT findings: None. ABDOMEN/PELVIS: No abnormal hypermetabolic activity within the liver, pancreas, adrenal glands, or spleen. No hypermetabolic lymph nodes in the abdomen or pelvis. Incidental CT findings: Colonic diverticulosis without findings of acute diverticulitis. Nodular uterine contour likely reflects leiomyomas. SKELETON: Focus of hypermetabolic activity about the left acromioclavicular joint without underlying osseous lesion identified with a max SUV of 5.3. Minimally metabolic right posterior gluteal soft tissue nodularity on image 153/4 with a max SUV of 0.7 likely reflects sequela of subcutaneous injections or prior trauma. Incidental CT findings: Multilevel degenerative changes of the spine with multifocal degenerative joint disease. IMPRESSION: 1. Minimally metabolic 17 mm mixed density right lower lobe pulmonary nodule may reflect an infectious or inflammatory etiology, but given its morphology a very low-grade primary bronchogenic adenocarcinoma remains a pertinent differential consideration. Consider referral to multidisciplinary tumor board and dedicated chest CT surveillance imaging or versus direct tissue sampling. 2. Hypermetabolic heterogeneous 19 mm right thyroid nodule, suggest further evaluation with thyroid ultrasound and FNA. 3. Hypermetabolic mild symmetric distal esophageal wall thickening with a max SUV of 4.3, possibly reflecting esophagitis. Consider further evaluation with endoscopy. 4. Hypermetabolic hyperplasia of the tonsils slightly asymmetric to the left, nonspecific but commonly reactive. Consider correlation with direct visualization. 5. Focus of  hypermetabolic activity about the left acromioclavicular joint without underlying osseous lesion identified, favored inflammatory. 6. Minimally metabolic right posterior gluteal soft tissue nodularity likely reflects sequela of subcutaneous injections or prior trauma. 7. Colonic diverticulosis without findings of acute diverticulitis. 8. Nodular uterine contour likely reflects leiomyomas. Electronically Signed   By: Dahlia Bailiff M.D.   On: 10/29/2021 14:32   CT CARDIAC SCORING (SELF PAY ONLY)  Addendum Date: 10/15/2021   ADDENDUM REPORT: 10/15/2021 14:49 EXAM: OVER-READ INTERPRETATION CARDIAC CT CHEST The following report is an over-read performed by radiologist Dr. Van Clines of [facility], [place] on 10/15/2021. This over-read does not include interpretation of cardiac or coronary anatomy or pathology.  The coronary calcium score interpretation by the cardiologist is attached. COMPARISON:  Chest radiograph 11/03/2015 FINDINGS: Extracardiac Vascular: Unremarkable Mediastinum: Unremarkable Lung: Part solid right lower lobe nodule measuring 2.7 by 2.5 by 2.5 cm, solid component measuring 1.2 by 0.7 by 1.1 cm. This has a concerning morphology for low-grade a adenocarcinoma based on size and morphology of the features including the solid component up to 1.2 cm. A ground-glass opacity nodule in the lingula measures 1.6 by 1.0 by 0.4 cm on image 58 series 2. A ground-glass density nodule in the left lower lobe measures 0.7 by 0.6 by 0.7 cm on image 58 series 2. A solid 4 mm nodule is present in the posterior basal segment left lower lobe on image 68 series 2. Included Upper Abdomen: Unremarkable Musculoskeletal: Nodularity medially in the right breast with dense round associated calcifications, likely benign, although correlation with mammographic history is recommended. IMPRESSION: 1. Part solid right lower lobe nodule is 2.7 cm in diameter with solid component measuring 1.2 cm. Suspicious morphology for  adenocarcinoma, the consensus opinion between Dr. Rosario Jacks and myself is that the worrisome morphology overrides the typical three to six-month follow up imaging time frame. Accordingly, pulmonology/thoracic surgery consultation and PET-CT recommended. PET-CT could be performed in 3-4 weeks time in order to assess stability 2. There are also smaller ground-glass focal opacities in the lingula and left lower lobe along with a very small left lower lobe solid nodule which can be reassessed at the time of follow up imaging. 3. Likely benign nodularity in the right breast with associated thick round calcifications, likely benign although correlation with mammographic history is recommended. Electronically Signed   By: Van Clines M.D.   On: 10/15/2021 14:49   Result Date: 10/15/2021 CLINICAL DATA:  Cardiovascular Disease Risk stratification EXAM: Coronary Calcium Score TECHNIQUE: A gated, non-contrast computed tomography scan of the heart was performed using 48m slice thickness. Axial images were analyzed on a dedicated workstation. Calcium scoring of the coronary arteries was performed using the Agatston method. FINDINGS: Coronary arteries: Normal origins. Coronary Calcium Score: Total: 0 Pericardium: Normal. Ascending Aorta: Normal caliber. [facility]card[facility]: [facility] IMPRESSION: Coronary calcium score of 0. RECOMMENDATIONS: Coronary artery calcium (CAC) score is a strong predictor of incident coronary heart disease (CHD) and provides predictive information beyond traditional risk factors. CAC scoring is reasonable to use in the decision to withhold, postpone, or initiate statin therapy in intermediate-risk or selected borderline-risk asymptomatic adults (age 804-75years and LDL-C >=70 to <190 mg/dL) who do not have diabetes or established atherosclerotic cardiovascular disease (ASCVD).* In intermediate-risk (10-year ASCVD risk >=7.5% to <20%) adults or selected borderline-risk  (10-year ASCVD risk >=5% to <7.5%) adults in whom a CAC score is measured for the purpose of making a treatment decision the following recommendations have been made: If CAC=0, it is reasonable to withhold statin therapy and reassess in 5 to 10 years, as long as higher risk conditions are absent (diabetes mellitus, family history of premature CHD in first degree relatives (males <55 years; females <65 years), cigarette smoking, or LDL >=190 mg/dL). If CAC is 1 to 99, it is reasonable to initiate statin therapy for patients >=579years of age. If CAC is >=100 or >=75th percentile, it is reasonable to initiate statin therapy at any age. Cardiology referral should be considered for patients with CAC scores >=400 or >=75th percentile. *2018 AHA/ACC/AACVPR/AAPA/ABC/ACPM/ADA/AGS/APhA/ASPC/NLA/PCNA Guideline on the Management of Blood Cholesterol: A Report of the American College of Cardiology/American Heart Association Task  Force on Clinical Practice Guidelines. J Am Coll Cardiol. 2019;73(24):3168-3209. Electronically Signed: By: Candee Furbish M.D. On: 10/15/2021 14:03    Lab Results  Component Value Date   WBC 6.1 08/30/2021   HGB 13.3 08/30/2021   HCT 40.0 08/30/2021   MCV 93.2 08/30/2021   PLT 279 08/30/2021    Lab Results  Component Value Date   CREATININE 0.74 08/30/2021   BUN 13 08/30/2021   NA 140 08/30/2021   K 4.4 08/30/2021   CL 105 08/30/2021   CO2 29 08/30/2021    Lab Results  Component Value Date   ALT 18 08/30/2021   AST 24 08/30/2021   ALKPHOS 31 (L) 02/11/2018   BILITOT 0.8 08/30/2021      Physical Exam: BP 126/80   Pulse 71   Ht 5' 2"  (1.575 m)   Wt 153 lb (69.4 kg)   LMP 01/10/2014   SpO2 98%   BMI 27.98 kg/m  Constitutional: Pleasant,well-developed, female in no acute distress. HEENT: Normocephalic and atraumatic. Conjunctivae are normal. No scleral icterus. Oral mucosa appears normal, no obvious thrush Neck supple.  Cardiovascular: Normal rate, regular rhythm.   Pulmonary/chest: Effort normal and breath sounds normal.  Abdominal: Soft, nondistended, nontender. There are no masses palpable.  Extremities: no edema Lymphadenopathy: No cervical adenopathy noted. Neurological: Alert and oriented to person place and time. Skin: Skin is warm and dry. No rashes noted. Psychiatric: Normal mood and affect. Behavior is normal.   ASSESSMENT: 61 y.o. female here for assessment of the following  1. Throat soreness   2. Gastroesophageal reflux disease, unspecified whether esophagitis present   3. Dysphagia, unspecified type   4. Abnormal gastrointestinal PET scan   5. Crohn's disease of colon without complication ([place])   6. High risk medication use    History as outlined above.  Recent cardiac CT scan showed a nodule in the right lung, followed up with PET scan which suggests more likely an inflammatory nodule and less likely malignancy which is good news.  PET scan incidentally noted some esophageal wall thickening with some uptake.  She does have a history of GERD, could have esophagitis causing this finding.  I think malignancy would be unlikely in this area especially with a normal EGD in 2017, however with her sore throat, PET findings, history of dysphagia, I think an EGD is appropriate to further evaluate.  I discussed risk benefits of EGD and anesthesia and she wants to proceed.  If the exam is normal and no clear source for dysphagia, would offer her an empiric dilation as she has not had that before.  That being said if that did not help her I would assume her dysphagia may more likely be related to dysmotility which has been noted on prior exams.  She may benefit from a manometry over time to ensure no evidence of achalasia although that would seem less likely.  In regards to her throat soreness, EGD will rule out candidiasis, ensure no oral aphthous ulcerations or ulcerations in her esophagus.  I think reasonable to transition from Pepcid once daily to  omeprazole 40 mg once daily to better treat reflux and see if this helps her throat and her underlying reflux symptoms, perhaps may help her cough as well.  She is agreeable to try this for a few weeks to see if it will help.  She has no history of CKD or osteoporosis.  Otherwise we reviewed her Crohn's disease history today.  Colonic involvement that appears clinically fairly  well controlled with Humira at baseline dosing.  Her last colonoscopy looked okay, her last lab work looked stable.  She is having some intermittent loose stools at baseline which it appears she has had in the past.  I would like to check a fecal calprotectin to see if she has any active inflammation in her colon, we can use that to determine how to treat her.  If there is active inflammation we will likely check her Humira levels and may need to consider a colonoscopy.  In terms of other healthcare maintenance, her vaccinations are all up-to-date.  She is due for her yearly QuantiFERON gold testing.  She can use Imodium as needed for loose stools after submitting fecal calprotectin.  Once we sort out these acute issues, I would like to see her every 6 months for routine follow-up for her Crohn's.  She agrees  PLAN: - EGD at [facility], plan on empiric dilation if normal for dysphagia - scheduled next Friday at 2 PM - stop pepcid - start omeprazole 6m / day #30 - lab for quantiferon gold - lab for fecal calprotectin. Further workup pending this result - immoidum PRN for stools if fecal calpro negative - preventative vaccines UTD - surveillance colonoscopy 12/2022, may consider sooner pending her course  SJolly Mango MD [facility]  CC: BElby Showers MD

## 2021-11-06 ENCOUNTER — Encounter (HOSPITAL_COMMUNITY): Admission: RE | Payer: Self-pay | Source: Home / Self Care

## 2021-11-06 ENCOUNTER — Ambulatory Visit (HOSPITAL_COMMUNITY): Admission: RE | Admit: 2021-11-06 | Payer: 59 | Source: Home / Self Care | Admitting: Pulmonary Disease

## 2021-11-06 DIAGNOSIS — R911 Solitary pulmonary nodule: Secondary | ICD-10-CM | POA: Insufficient documentation

## 2021-11-06 SURGERY — BRONCHOSCOPY, WITH BIOPSY USING ELECTROMAGNETIC NAVIGATION
Anesthesia: General | Laterality: Bilateral

## 2021-11-07 ENCOUNTER — Ambulatory Visit: Payer: 59 | Admitting: Pulmonary Disease

## 2021-11-08 ENCOUNTER — Ambulatory Visit (HOSPITAL_COMMUNITY)
Admission: RE | Admit: 2021-11-08 | Discharge: 2021-11-08 | Disposition: A | Discharge status: Home / Self Care | Payer: 59 | Source: Ambulatory Visit | Attending: Pulmonary Disease | Admitting: Pulmonary Disease

## 2021-11-08 ENCOUNTER — Other Ambulatory Visit: Payer: 59

## 2021-11-08 DIAGNOSIS — E041 Nontoxic single thyroid nodule: Secondary | ICD-10-CM | POA: Diagnosis not present

## 2021-11-08 LAB — QUANTIFERON-TB GOLD PLUS
Mitogen-NIL: >10 IU/mL
NIL: 0.04 IU/mL
QuantiFERON-TB Gold Plus: NEGATIVE
TB1-NIL: 0.02 IU/mL
TB2-NIL: 0.05 IU/mL

## 2021-11-09 ENCOUNTER — Ambulatory Visit (AMBULATORY_SURGERY_CENTER): Payer: 59 | Admitting: Gastroenterology

## 2021-11-09 ENCOUNTER — Other Ambulatory Visit (HOSPITAL_COMMUNITY): Payer: Self-pay

## 2021-11-09 ENCOUNTER — Encounter: Payer: Self-pay | Admitting: Gastroenterology

## 2021-11-09 ENCOUNTER — Other Ambulatory Visit: Payer: 59

## 2021-11-09 VITALS — BP 136/75 | HR 65 | Temp 97.5°F | Resp 11 | Ht 62.0 in | Wt 153.0 lb

## 2021-11-09 DIAGNOSIS — R948 Abnormal results of function studies of other organs and systems: Secondary | ICD-10-CM | POA: Diagnosis not present

## 2021-11-09 DIAGNOSIS — K222 Esophageal obstruction: Secondary | ICD-10-CM | POA: Diagnosis not present

## 2021-11-09 DIAGNOSIS — Z79899 Other long term (current) drug therapy: Secondary | ICD-10-CM

## 2021-11-09 DIAGNOSIS — K5010 Crohn's disease of large intestine without complications: Secondary | ICD-10-CM | POA: Diagnosis not present

## 2021-11-09 DIAGNOSIS — K219 Gastro-esophageal reflux disease without esophagitis: Secondary | ICD-10-CM

## 2021-11-09 DIAGNOSIS — R1310 Dysphagia, unspecified: Secondary | ICD-10-CM

## 2021-11-09 DIAGNOSIS — K200 Eosinophilic esophagitis: Secondary | ICD-10-CM | POA: Diagnosis not present

## 2021-11-09 DIAGNOSIS — J029 Acute pharyngitis, unspecified: Secondary | ICD-10-CM | POA: Diagnosis not present

## 2021-11-09 MED ORDER — SODIUM CHLORIDE 0.9 % IV SOLN: 500.0000 mL | INTRAVENOUS | Status: DC

## 2021-11-09 MED ORDER — OMEPRAZOLE 40 MG PO CPDR
40.0000 mg | DELAYED_RELEASE_CAPSULE | Freq: Every day | ORAL | 3 refills | Status: DC
  Filled 2021-11-09 – 2022-01-24 (×2): qty 90, 90d supply, fill #0

## 2021-11-09 NOTE — Progress Notes (Signed)
Called to room to assist during endoscopic procedure.  Patient ID and intended procedure confirmed with present staff. Received instructions for my participation in the procedure from the performing physician.  

## 2021-11-09 NOTE — Progress Notes (Signed)
History and Physical Interval Note: Patient seen on 11/02/21 for symptoms below. No interval changes. Prescribed omeprazole but she has not started taking it yet. Scheduled for EGD with possible dilation to evaluate / treat. Have discussed risks / benefits with the patient, she wishes to proceed. She otherwise feels well today    11/09/2021 9:16 AM  [name]  has presented today for endoscopic procedure(s), with the diagnosis of  Encounter Diagnoses  Name Primary?   Abnormal gastrointestinal PET scan Yes   Dysphagia, unspecified type    Throat soreness    Gastroesophageal reflux disease, unspecified whether esophagitis present   .  The various methods of evaluation and treatment have been discussed with the patient and/or family. After consideration of risks, benefits and other options for treatment, the patient has consented to  the endoscopic procedure(s).   The patient's history has been reviewed, patient examined, no change in status, stable for surgery.  I have reviewed the patient's chart and labs.  Questions were answered to the patient's satisfaction.    Jolly Mango, MD [facility]

## 2021-11-09 NOTE — Op Note (Signed)
[facility] Patient Name: [name] Procedure Date: 11/09/2021 9:10 AM MRN: [id number] Endoscopist: Remo Lipps P. Havery Moros , MD, [id number] Age: 61 Referring MD:  Date of Birth: 10/19/1960 Gender: Female Account #: [id number] Procedure:                Upper GI endoscopy Indications:              Dysphagia, history of esophageal reflux on pepcid,                            sore throat, Abnormal PET scan of the GI tract with                            uptake of distal esophagus Medicines:                Monitored Anesthesia Care Procedure:                Pre-Anesthesia Assessment:                           - Prior to the procedure, a History and Physical                            was performed, and patient medications and                            allergies were reviewed. The patient's tolerance of                            previous anesthesia was also reviewed. The risks                            and benefits of the procedure and the sedation                            options and risks were discussed with the patient.                            All questions were answered, and informed consent                            was obtained. Prior Anticoagulants: The patient has                            taken no anticoagulant or antiplatelet agents. ASA                            Grade Assessment: III - A patient with severe                            systemic disease. After reviewing the risks and                            benefits, the patient was deemed in satisfactory  condition to undergo the procedure.                           After obtaining informed consent, the endoscope was                            passed under direct vision. Throughout the                            procedure, the patient's blood pressure, pulse, and                            oxygen saturations were monitored continuously. The                            GIF [id number]  #[id number] was introduced through the                            mouth, and advanced to the second part of duodenum.                            The upper GI endoscopy was accomplished without                            difficulty. The patient tolerated the procedure                            well. Scope In: Scope Out: Findings:                 Esophagogastric landmarks were identified: the                            Z-line was found at 36 cm, the gastroesophageal                            junction was found at 36 cm and the upper extent of                            the gastric folds was found at 36 cm from the                            incisors.                           Mild LA Grade A esophagitis was found 36 cm from                            the incisors.                           One benign-appearing, intrinsic mild stenosis was                            found 36 cm from the incisors. A TTS  dilator was                            passed through the scope. Dilation with a balloon                            dilator was performed to 16 mm with an appropriate                            mucosal wrent at the GEJ.                           Mucosal changes including longitudinal furrows were                            found in the lower third of the esophagus. Biopsies                            were obtained from the proximal and distal                            esophagus with cold forceps for histology to rule                            out eosinophilic esophagitis.                           The exam of the esophagus was otherwise normal.                           The entire examined stomach was normal.                           The examined duodenum was normal. Complications:            No immediate complications. Estimated blood loss:                            Minimal. Estimated Blood Loss:     Estimated blood loss was minimal. Impression:               - Esophagogastric landmarks  identified.                           - LA Grade A reflux esophagitis.                           - Benign-appearing esophageal stenosis. Dilated to                            35m with good result.                           - Esophageal mucosal changes suspicious for  possible eosinophilic esophagitis vs. reflux                            changes.                           - Normal stomach.                           - Normal examined duodenum. Recommendation:           - Patient has a contact number available for                            emergencies. The signs and symptoms of potential                            delayed complications were discussed with the                            patient. Return to normal activities tomorrow.                            Written discharge instructions were provided to the                            patient.                           - Post-dilation diet                           - Continue present medications.                           - Start omeprazole 81m / day as previously                            recommended                           - Await pathology results. SRemo LippsP. Birdia Jaycox, MD 11/09/2021 9:41:05 AM This report has been signed electronically.

## 2021-11-09 NOTE — Patient Instructions (Signed)
YOU HAD AN ENDOSCOPIC PROCEDURE TODAY AT [facility]:   Refer to the procedure report that was given to you for any specific questions about what was found during the examination.  If the procedure report does not answer your questions, please call your gastroenterologist to clarify.  If you requested that your care partner not be given the details of your procedure findings, then the procedure report has been included in a sealed envelope for you to review at your convenience later.  YOU SHOULD EXPECT: Some feelings of bloating in the abdomen. Passage of more gas than usual.  Walking can help get rid of the air that was put into your GI tract during the procedure and reduce the bloating. If you had a lower endoscopy (such as a colonoscopy or flexible sigmoidoscopy) you may notice spotting of blood in your stool or on the toilet paper. If you underwent a bowel prep for your procedure, you may not have a normal bowel movement for a few days.  Please Note:  You might notice some irritation and congestion in your nose or some drainage.  This is from the oxygen used during your procedure.  There is no need for concern and it should clear up in a day or so.  SYMPTOMS TO REPORT IMMEDIATELY:   Following upper endoscopy (EGD)  Vomiting of blood or coffee ground material  New chest pain or pain under the shoulder blades  Painful or persistently difficult swallowing  New shortness of breath  Fever of 100F or higher  Black, tarry-looking stools  For urgent or emergent issues, a gastroenterologist can be reached at any hour by calling [phone]. Do not use MyChart messaging for urgent concerns.    DIET:  FOLLOW A POST-DILATION DIET (SEE HANDOUT GIVEN TO YOU BY YOUR RECOVERY NURSE): NOTHING BY MOUTH until 1045, then [place] from 1045 to 1145, then a SOFT DIET for the rest of the day today starting at 1145. You may then proceed to your regular diet tomorrow morning. Drink  plenty of fluids but you should avoid alcoholic beverages for 24 hours.  MEDICATIONS: Continue present medications. Start Omeprazole 40 mg by mouth once daily as previously recommended.  Please see handouts given to you by your recovery nurse.  Thank you for allowing [place] to provide for your healthcare needs today.  ACTIVITY:  You should plan to take it easy for the rest of today and you should NOT DRIVE or use heavy machinery until tomorrow (because of the sedation medicines used during the test).    FOLLOW UP: Our staff will call the number listed on your records the next business day following your procedure.  We will call around 7:15- 8:00 am to check on you and address any questions or concerns that you may have regarding the information given to you following your procedure. If we do not reach you, we will leave a message.     If any biopsies were taken you will be contacted by phone or by letter within the next 1-3 weeks.  Please call us at [phone] if you have not heard about the biopsies in 3 weeks.    SIGNATURES/CONFIDENTIALITY: You and/or your care partner have signed paperwork which will be entered into your electronic medical record.  These signatures attest to the fact that that the information above on your After Visit Summary has been reviewed and is understood.  Full responsibility of the confidentiality of this discharge information lies with you  and/or your care-partner.

## 2021-11-09 NOTE — Progress Notes (Signed)
Sedate, gd SR, tolerated procedure well, VSS, report to RN 

## 2021-11-12 ENCOUNTER — Telehealth: Payer: Self-pay | Admitting: *Deleted

## 2021-11-12 DIAGNOSIS — Z1231 Encounter for screening mammogram for malignant neoplasm of breast: Secondary | ICD-10-CM | POA: Diagnosis not present

## 2021-11-12 LAB — HM MAMMOGRAPHY

## 2021-11-12 NOTE — Telephone Encounter (Signed)
  Follow up Call-     11/09/2021    8:20 AM  Call back number  Post procedure Call Back phone  # [phone]  Permission to leave phone message Yes     Patient questions:  Do you have a fever, pain , or abdominal swelling? No. Pain Score  0 *  Have you tolerated food without any problems? Yes.    Have you been able to return to your normal activities? Yes.    Do you have any questions about your discharge instructions: Diet   No. Medications  No. Follow up visit  No.  Do you have questions or concerns about your Care? No.  Actions: * If pain score is 4 or above: No action needed, pain <4.

## 2021-11-13 ENCOUNTER — Other Ambulatory Visit (HOSPITAL_COMMUNITY): Payer: Self-pay

## 2021-11-13 ENCOUNTER — Other Ambulatory Visit: Payer: Self-pay | Admitting: Gastroenterology

## 2021-11-13 LAB — CALPROTECTIN, FECAL: Calprotectin, Fecal: 8 ug/g (ref 0–120)

## 2021-11-13 NOTE — Telephone Encounter (Signed)
[place] okay to refill. Thanks

## 2021-11-14 ENCOUNTER — Other Ambulatory Visit: Payer: Self-pay | Admitting: Pharmacist

## 2021-11-14 ENCOUNTER — Other Ambulatory Visit (HOSPITAL_COMMUNITY): Payer: Self-pay

## 2021-11-14 ENCOUNTER — Ambulatory Visit (HOSPITAL_COMMUNITY)
Admission: RE | Admit: 2021-11-14 | Discharge: 2021-11-14 | Disposition: A | Discharge status: Home / Self Care | Payer: 59 | Source: Ambulatory Visit | Attending: Pulmonary Disease | Admitting: Pulmonary Disease

## 2021-11-14 DIAGNOSIS — E041 Nontoxic single thyroid nodule: Secondary | ICD-10-CM | POA: Diagnosis not present

## 2021-11-14 MED ORDER — LIDOCAINE HCL (PF) 1 % IJ SOLN: 6.0000 mL | Freq: Once | INTRAMUSCULAR | Status: DC

## 2021-11-14 MED ORDER — HUMIRA (2 SYRINGE) 40 MG/0.4ML ~~LOC~~ PSKT
0.4000 mL | PREFILLED_SYRINGE | SUBCUTANEOUS | 5 refills | Status: DC
  Filled 2021-11-14: qty 2, 28d supply, fill #0

## 2021-11-14 MED ORDER — HUMIRA (2 SYRINGE) 40 MG/0.4ML ~~LOC~~ PSKT
0.4000 mL | PREFILLED_SYRINGE | SUBCUTANEOUS | 5 refills | Status: DC
  Filled 2021-11-14: qty 2, 28d supply, fill #0
  Filled 2021-12-14 – 2021-12-31 (×2): qty 2, 28d supply, fill #1
  Filled 2022-01-28: qty 2, 28d supply, fill #2

## 2021-11-14 MED ORDER — LIDOCAINE HCL (PF) 1 % IJ SOLN
INTRAMUSCULAR | Status: AC
  Administered 2021-11-14: 6 mL via INTRADERMAL
  Filled 2021-11-14: qty 30

## 2021-11-15 ENCOUNTER — Encounter: Payer: Self-pay | Admitting: Internal Medicine

## 2021-11-15 ENCOUNTER — Other Ambulatory Visit: Payer: Self-pay

## 2021-11-15 DIAGNOSIS — K200 Eosinophilic esophagitis: Secondary | ICD-10-CM

## 2021-11-16 LAB — CYTOLOGY - NON PAP

## 2021-11-19 ENCOUNTER — Other Ambulatory Visit (HOSPITAL_COMMUNITY): Payer: Self-pay

## 2021-11-23 ENCOUNTER — Other Ambulatory Visit (HOSPITAL_COMMUNITY): Payer: Self-pay

## 2021-11-23 ENCOUNTER — Other Ambulatory Visit: Payer: Self-pay | Admitting: Family Medicine

## 2021-11-23 ENCOUNTER — Other Ambulatory Visit: Payer: Self-pay | Admitting: Internal Medicine

## 2021-11-23 DIAGNOSIS — R92322 Mammographic fibroglandular density, left breast: Secondary | ICD-10-CM | POA: Diagnosis not present

## 2021-11-23 DIAGNOSIS — N6489 Other specified disorders of breast: Secondary | ICD-10-CM | POA: Diagnosis not present

## 2021-11-23 LAB — HM MAMMOGRAPHY

## 2021-11-23 MED ORDER — DULOXETINE HCL 60 MG PO CPEP
60.0000 mg | ORAL_CAPSULE | Freq: Every day | ORAL | 1 refills | Status: DC
  Filled 2021-11-23: qty 90, 90d supply, fill #0
  Filled 2022-03-05: qty 90, 90d supply, fill #1

## 2021-11-26 ENCOUNTER — Encounter: Payer: Self-pay | Admitting: Internal Medicine

## 2021-11-26 ENCOUNTER — Other Ambulatory Visit (HOSPITAL_COMMUNITY): Payer: Self-pay

## 2021-11-26 MED ORDER — VITAMIN D (ERGOCALCIFEROL) 1.25 MG (50000 UNIT) PO CAPS
50000.0000 [IU] | ORAL_CAPSULE | ORAL | 0 refills | Status: DC
  Filled 2021-11-26: qty 12, 84d supply, fill #0

## 2021-11-28 ENCOUNTER — Other Ambulatory Visit (HOSPITAL_COMMUNITY): Payer: Self-pay

## 2021-11-28 ENCOUNTER — Other Ambulatory Visit: Payer: Self-pay | Admitting: Radiology

## 2021-11-28 DIAGNOSIS — N6012 Diffuse cystic mastopathy of left breast: Secondary | ICD-10-CM | POA: Diagnosis not present

## 2021-11-28 DIAGNOSIS — R928 Other abnormal and inconclusive findings on diagnostic imaging of breast: Secondary | ICD-10-CM | POA: Diagnosis not present

## 2021-11-29 ENCOUNTER — Other Ambulatory Visit (HOSPITAL_COMMUNITY): Payer: Self-pay

## 2021-11-29 ENCOUNTER — Ambulatory Visit: Payer: Self-pay | Admitting: Surgery

## 2021-11-29 DIAGNOSIS — C73 Malignant neoplasm of thyroid gland: Secondary | ICD-10-CM | POA: Diagnosis not present

## 2021-11-29 DIAGNOSIS — E042 Nontoxic multinodular goiter: Secondary | ICD-10-CM | POA: Diagnosis not present

## 2021-11-30 ENCOUNTER — Ambulatory Visit (HOSPITAL_BASED_OUTPATIENT_CLINIC_OR_DEPARTMENT_OTHER): Payer: 59

## 2021-11-30 ENCOUNTER — Ambulatory Visit (INDEPENDENT_AMBULATORY_CARE_PROVIDER_SITE_OTHER): Payer: 59

## 2021-11-30 DIAGNOSIS — R911 Solitary pulmonary nodule: Secondary | ICD-10-CM

## 2021-11-30 DIAGNOSIS — R918 Other nonspecific abnormal finding of lung field: Secondary | ICD-10-CM | POA: Diagnosis not present

## 2021-12-01 NOTE — Progress Notes (Signed)
I spoke with Dr. Cathie Olden her husband  She has appt with [facility] already   Thanks,  [place], DO [facility] 12/01/2021 5:19 PM

## 2021-12-03 NOTE — Progress Notes (Signed)
[name] seeing you tomorrow.  Looks like she needs a repeat CT scan in 1 year.  Thanks,  BLI  [name], DO [facility] 12/03/2021 11:04 AM

## 2021-12-04 ENCOUNTER — Ambulatory Visit (INDEPENDENT_AMBULATORY_CARE_PROVIDER_SITE_OTHER): Payer: 59 | Admitting: Acute Care

## 2021-12-04 ENCOUNTER — Encounter: Payer: Self-pay | Admitting: Acute Care

## 2021-12-04 VITALS — BP 124/72 | HR 76 | Temp 98.3°F | Ht 62.0 in | Wt 151.0 lb

## 2021-12-04 DIAGNOSIS — R9389 Abnormal findings on diagnostic imaging of other specified body structures: Secondary | ICD-10-CM | POA: Diagnosis not present

## 2021-12-04 DIAGNOSIS — R0982 Postnasal drip: Secondary | ICD-10-CM | POA: Diagnosis not present

## 2021-12-04 DIAGNOSIS — R918 Other nonspecific abnormal finding of lung field: Secondary | ICD-10-CM | POA: Diagnosis not present

## 2021-12-04 DIAGNOSIS — R911 Solitary pulmonary nodule: Secondary | ICD-10-CM | POA: Diagnosis not present

## 2021-12-04 NOTE — Progress Notes (Unsigned)
History of Present Illness [name] is a 61 y.o. female with  past medical history of anxiety, arthritis, asthma, Crohn's disease, sleep apnea on CPAP.  Lifelong non-smoker.Patient had a CT scan of the chest for cardiac scoring completed on 10/15/2021.  This revealed a part solid right lower lobe 2.7 cm diameter nodule with a 1.2 cm solid component suspicious morphology concerning for adenocarcinoma.  Patient was referred for evaluation of pulmonary nodule with Dr. Valeta Harms.    12/04/2021 Pt. Presents for follow up   Test Results: CT Chest without contrast 11/30/2021 No acute cardiopulmonary disease. 2. Near complete resolution of previously seen part solid nodule over the right lower lobe and resolution of previous seen subcentimeter ground-glass nodule over the left lower lobe as these can be considered benign. Persistent 3-4 mm nodule over the posterior left lower lobe as consider follow-up noncontrast chest CT 1 year. This recommendation follows the consensus statement: Guidelines for Management of Small Pulmonary Nodules Detected on CT Scans: A Statement from the [place] as published in Radiology 2005; 237:395-400. Online at: [email or web address]://www.[email or web address].[email or web address]/. 3. Aortic atherosclerosis. 4. Colonic diverticulosis.  PET 10/29/2021 Minimally metabolic 17 mm mixed density right lower lobe pulmonary nodule may reflect an infectious or inflammatory etiology, but given its morphology a very low-grade primary bronchogenic adenocarcinoma remains a pertinent differential consideration. Consider referral to multidisciplinary tumor board and dedicated chest CT surveillance imaging or versus direct tissue sampling. 2. Hypermetabolic heterogeneous 19 mm right thyroid nodule, suggest further evaluation with thyroid ultrasound and FNA. 3. Hypermetabolic mild symmetric distal esophageal wall thickening with a max SUV of 4.3, possibly reflecting  esophagitis. Consider further evaluation with endoscopy. 4. Hypermetabolic hyperplasia of the tonsils slightly asymmetric to the left, nonspecific but commonly reactive. Consider correlation with direct visualization. 5. Focus of hypermetabolic activity about the left acromioclavicular joint without underlying osseous lesion identified, favored inflammatory. 6. Minimally metabolic right posterior gluteal soft tissue nodularity likely reflects sequela of subcutaneous injections or prior trauma. 7. Colonic diverticulosis without findings of acute diverticulitis. 8. Nodular uterine contour likely reflects leiomyomas.  10/15/2021 Cardiac Scoring CT chest   IMPRESSION: 1. Part solid right lower lobe nodule is 2.7 cm in diameter with solid component measuring 1.2 cm. Suspicious morphology for adenocarcinoma, the consensus opinion between Dr. Rosario Jacks and myself is that the worrisome morphology overrides the typical three to six-month follow up imaging time frame. Accordingly, pulmonology/thoracic surgery consultation and PET-CT recommended. PET-CT could be performed in 3-4 weeks time in order to assess stability 2. There are also smaller ground-glass focal opacities in the lingula and left lower lobe along with a very small left lower lobe solid nodule which can be reassessed at the time of follow up imaging. 3. Likely benign nodularity in the right breast with associated thick round calcifications, likely benign although correlation with mammographic history is recommended.         Latest Ref Rng & Units 08/30/2021    9:39 AM 02/26/2021   12:54 PM 07/20/2020    3:00 PM  CBC  WBC 3.8 - 10.8 Thousand/uL 6.1  9.4  7.7   Hemoglobin 11.7 - 15.5 g/dL 13.3  13.0  12.5   Hematocrit 35.0 - 45.0 % 40.0  38.4  38.1   Platelets 140 - 400 Thousand/uL 279  278  253        Latest Ref Rng & Units 08/30/2021    9:39 AM 03/02/2020   11:00 AM 09/25/2018    9:23 AM  BMP  Glucose 65 -  99 mg/dL 86   85  84   BUN 7 - 25 mg/dL 13  11  12    Creatinine 0.50 - 1.05 mg/dL 0.74  0.67  0.81   BUN/Creat Ratio 6 - 22 (calc) SEE NOTE:  NOT APPLICABLE  NOT APPLICABLE   Sodium 330 - 146 mmol/L 140  138  137   Potassium 3.5 - 5.3 mmol/L 4.4  4.1  4.4   Chloride 98 - 110 mmol/L 105  105  103   CO2 20 - 32 mmol/L 29  26  23    Calcium 8.6 - 10.4 mg/dL 9.8  8.6  9.4     BNP No results found for: "BNP"  ProBNP No results found for: "PROBNP"  PFT    Component Value Date/Time   FEV1PRE 2.58 10/22/2021 0852   FEV1POST 2.67 10/22/2021 0852   FVCPRE 3.49 10/22/2021 0852   FVCPOST 3.50 10/22/2021 0852   TLC 5.42 10/22/2021 0852   DLCOUNC 19.15 10/22/2021 0852   PREFEV1FVCRT 74 10/22/2021 0852   PSTFEV1FVCRT 76 10/22/2021 0852    CT Chest Wo Contrast  Result Date: 12/02/2021 CLINICAL DATA:  Follow-up 8 mm lung nodule. EXAM: CT CHEST WITHOUT CONTRAST TECHNIQUE: Multidetector CT imaging of the chest was performed following the standard protocol without IV contrast. RADIATION DOSE REDUCTION: This exam was performed according to the departmental dose-optimization program which includes automated exposure control, adjustment of the mA and/or kV according to patient size and/or use of iterative reconstruction technique. COMPARISON:  CT 10/15/2021, 10/29/2021 and PET-CT 10/29/2021 FINDINGS: Cardiovascular: Heart is normal size. Thoracic aorta is normal in caliber. Mild calcified plaque at the bifurcation of the right brachiocephalic artery. Remaining vascular structures are unremarkable. Mediastinum/Nodes: No significant mediastinal or hilar adenopathy. Lungs/Pleura: Lungs are adequately inflated without focal airspace consolidation or effusion. Near complete resolution of the previously seen part solid nodule over the right lower lobe. Resolution of previous seen subcentimeter ground-glass nodule over the left lower lobe. Slightly less prominent peripheral linear density over the lingula compatible with  scarring/atelectasis. Persistent 3-4 mm nodule over the posterior left lower lobe (image 116). No new nodules identified. Airways are normal. Upper Abdomen: No acute findings.  Minimal colonic diverticulosis. Musculoskeletal: Mild degenerative change of the spine. IMPRESSION: 1. No acute cardiopulmonary disease. 2. Near complete resolution of previously seen part solid nodule over the right lower lobe and resolution of previous seen subcentimeter ground-glass nodule over the left lower lobe as these can be considered benign. Persistent 3-4 mm nodule over the posterior left lower lobe as consider follow-up noncontrast chest CT 1 year. This recommendation follows the consensus statement: Guidelines for Management of Small Pulmonary Nodules Detected on CT Scans: A Statement from the [place] as published in Radiology 2005; 237:395-400. Online at: [email or web address].[email or web address].[email or web address]/. 3. Aortic atherosclerosis. 4. Colonic diverticulosis. Aortic Atherosclerosis (ICD10-I70.0). Electronically Signed   By: Marin Olp M.D.   On: 12/02/2021 11:01   [place] FNA BX THYROID 1ST LESION AFIRMA  Result Date: 11/14/2021 INDICATION: Right mid thyroid nodule; 2.2 cm EXAM: ULTRASOUND GUIDED FINE NEEDLE ASPIRATION OF INDETERMINATE THYROID NODULE COMPARISON:  US Thyroid 11/08/21. MEDICATIONS: 7 cc 1% lidocaine COMPLICATIONS: None immediate. TECHNIQUE: Informed written consent was obtained from the patient after a discussion of the risks, benefits and alternatives to treatment. Questions regarding the procedure were encouraged and answered. A timeout was performed prior to the initiation of the procedure. Pre-procedural ultrasound scanning demonstrated unchanged size and appearance of the indeterminate nodule within the right thyroid The procedure was planned. The  neck was prepped in the usual sterile fashion, and a sterile drape was applied covering the operative field. A timeout was performed prior to the  initiation of the procedure. Local anesthesia was provided with 1% lidocaine. Under direct ultrasound guidance, 6 FNA biopsies were performed of the right mid thyroid nodule with a 25 gauge needle. 2 of these samples were obtained for [facility] Multiple ultrasound images were saved for procedural documentation purposes. The samples were prepared and submitted to pathology. Limited post procedural scanning was negative for hematoma or additional complication. Dressings were placed. The patient tolerated the above procedures procedure well without immediate postprocedural complication. FINDINGS: Nodule reference number based on prior diagnostic ultrasound: 1 Maximum size: 2.2 cm Location: Right; Mid ACR TI-RADS risk category: TR5 (>/= 7 points) Reason for biopsy: meets ACR TI-RADS criteria Ultrasound imaging confirms appropriate placement of the needles within the thyroid nodule. IMPRESSION: Technically successful ultrasound guided fine needle aspiration of right mid thyroid nodule Read by Lavonia Drafts [facility] Electronically Signed   By: Markus Daft M.D.   On: 11/14/2021 18:09   US THYROID  Result Date: 11/08/2021 CLINICAL DATA:  Goiter. EXAM: THYROID ULTRASOUND TECHNIQUE: Ultrasound examination of the thyroid gland and adjacent soft tissues was performed. COMPARISON:  None Available. FINDINGS: Parenchymal Echotexture: Mildly heterogenous Isthmus: 0.2 cm Right lobe: 4.2 x 1.7 x 2.0 cm Left lobe: 3.8 x 1.3 x 1.2 cm _________________________________________________________ Estimated total number of nodules >/= 1 cm: 2 Number of spongiform nodules >/=  2 cm not described below (TR1): 0 Number of mixed cystic and solid nodules >/= 1.5 cm not described below (TR2): 0 _________________________________________________________ Nodule # 1: Location: Right; Mid Maximum size: 2.2 cm; Other 2 dimensions: 2.1 x 1.7 cm Composition: solid/almost completely solid (2) Echogenicity: hypoechoic (2) Shape: not taller-than-wide (0)  Margins: lobulated/irregular (2) Echogenic foci: macrocalcifications (1) ACR TI-RADS total points: 7. ACR TI-RADS risk category: TR5 (>/= 7 points). ACR TI-RADS recommendations: **Given size (>/= 1.0 cm) and appearance, fine needle aspiration of this highly suspicious nodule should be considered based on TI-RADS criteria. _________________________________________________________ Nodule # 2: Location: Right; Mid Maximum size: 1.3 cm; Other 2 dimensions: 1.3 x 1.0 cm Composition: solid/almost completely solid (2) Echogenicity: hypoechoic (2) Shape: not taller-than-wide (0) Margins: smooth (0) Echogenic foci: none (0) ACR TI-RADS total points: 4. ACR TI-RADS risk category: TR4 (4-6 points). ACR TI-RADS recommendations: *Given size (>/= 1 - 1.4 cm) and appearance, a follow-up ultrasound in 1 year should be considered based on TI-RADS criteria. _________________________________________________________ Nodule # 3: Small mixed cystic and solid nodule in the left mid gland does not warrant further evaluation. IMPRESSION: 1. Approximately 2.2 cm TI-RADS category 5 nodule in the right upper gland meets criteria to consider fine-needle aspiration biopsy. Biopsy is recommended. 2. A 1.3 cm TI-RADS category 4 nodule in the right mid gland meets criteria for imaging surveillance. Recommend follow-up ultrasound in 1 year. The above is in keeping with the ACR TI-RADS recommendations - J Am Coll Radiol 2017;14:587-595. Electronically Signed   By: Jacqulynn Cadet M.D.   On: 11/08/2021 15:56     Past medical hx Past Medical History:  Diagnosis Date   Anxiety    Allergy induced asthma   Arthritis    Asthma    Crohn disease ([place])    Depression    GERD (gastroesophageal reflux disease)    History of methicillin resistant staphylococcus aureus (MRSA)    Multiple thyroid nodules    Sleep apnea    CPAP  Social History   Tobacco Use   Smoking status: Never   Smokeless tobacco: Never  Vaping Use   Vaping Use:  Never used  Substance Use Topics   Alcohol use: Yes    Alcohol/week: 10.0 standard drinks of alcohol    Types: 10 Glasses of wine per week    Comment: 10 glasses/week   Drug use: No    Ms.Pizzolato reports that she has never smoked. She has never used smokeless tobacco. She reports current alcohol use of about 10.0 standard drinks of alcohol per week. She reports that she does not use drugs.  Tobacco Cessation: Counseling given: Not Answered   Past surgical hx, Family hx, Social hx all reviewed.  Current Outpatient Medications on File Prior to Visit  Medication Sig   acetaminophen (TYLENOL) 500 MG tablet Take 500-1,000 mg by mouth every 6 (six) hours as needed (pain).   Adalimumab (HUMIRA) 40 MG/0.4ML PSKT Inject 0.4 mLs (40 mg total) under the skin every 14 (fourteen) days.   albuterol (VENTOLIN HFA) 108 (90 Base) MCG/ACT inhaler Inhale 2 puffs into the lungs every 6 (six) hours as needed for wheezing or shortness of breath.   ascorbic acid (VITAMIN C) 500 MG tablet Take 500 mg by mouth in the morning.   bimatoprost (LUMIGAN) 0.01 % SOLN Instill 1 drop into both eyes at bedtime   busPIRone (BUSPAR) 7.5 MG tablet Take 1 tablet (7.5 mg total) by mouth 2 (two) times daily.   celecoxib (CELEBREX) 100 MG capsule Take 1 capsule (100 mg total) by mouth in the morning and at bedtime. (Patient taking differently: Take 100 mg by mouth in the morning.)   cetirizine (ZYRTEC) 10 MG tablet Take 10 mg by mouth in the morning.   DULoxetine (CYMBALTA) 60 MG capsule Take 1 capsule (60 mg total) by mouth daily.   estradiol (VIVELLE-DOT) 0.05 MG/24HR patch Apply 1 patch twice a week by transdermal route (Patient taking differently: Place 1 patch onto the skin 2 (two) times a week. On Saturdays & Tuesdays.)   famotidine (PEPCID) 20 MG tablet Take 20 mg by mouth in the morning.   fluocinonide ointment (LIDEX) 4.70 % Apply 1 application topically daily as needed (irritation).   fluticasone (FLONASE) 50 MCG/ACT  nasal spray Place 2 sprays into both nostrils every evening. (Patient not taking: Reported on 11/02/2021)   mupirocin ointment (BACTROBAN) 2 % Apply 1 application topically 2 (two) times daily. (Patient not taking: Reported on 11/09/2021)   omeprazole (PRILOSEC) 40 MG capsule Take 1 capsule (40 mg total) by mouth daily.   omeprazole (PRILOSEC) 40 MG capsule Take 1 capsule (40 mg total) by mouth daily.   progesterone (PROMETRIUM) 100 MG capsule Take 1 capsule (100 mg total) by mouth daily. (Patient taking differently: Take 100 mg by mouth every evening.)   Vitamin D, Ergocalciferol, (DRISDOL) 1.25 MG (50000 UNIT) CAPS capsule Take 1 capsule (50,000 Units total) by mouth every 7 (seven) days.   Current Facility-Administered Medications on File Prior to Visit  Medication   0.9 %  sodium chloride infusion     Allergies  Allergen Reactions   Chlorhexidine Gluconate Itching   Gabapentin Itching   Hydrocodone Itching   Oxycodone Hcl Itching    NDC JGGE:[id number]  NDC TKPT:[id number]    Review Of Systems:  Constitutional:   No  weight loss, night sweats,  Fevers, chills, fatigue, or  lassitude.  HEENT:   No headaches,  Difficulty swallowing,  Tooth/dental problems, or  Sore throat,  No sneezing, itching, ear ache, nasal congestion, post nasal drip,   CV:  No chest pain,  Orthopnea, PND, swelling in lower extremities, anasarca, dizziness, palpitations, syncope.   GI  No heartburn, indigestion, abdominal pain, nausea, vomiting, diarrhea, change in bowel habits, loss of appetite, bloody stools.   Resp: No shortness of breath with exertion or at rest.  No excess mucus, no productive cough,  No non-productive cough,  No coughing up of blood.  No change in color of mucus.  No wheezing.  No chest wall deformity  Skin: no rash or lesions.  GU: no dysuria, change in color of urine, no urgency or frequency.  No flank pain, no hematuria   MS:  No joint pain or swelling.  No  decreased range of motion.  No back pain.  Psych:  No change in mood or affect. No depression or anxiety.  No memory loss.   Vital Signs LMP 01/10/2014    Physical Exam:  General- No distress,  A&Ox3 ENT: No sinus tenderness, TM clear, pale nasal mucosa, no oral exudate,no post nasal drip, no LAN Cardiac: S1, S2, regular rate and rhythm, no murmur Chest: No wheeze/ rales/ dullness; no accessory muscle use, no nasal flaring, no sternal retractions Abd.: Soft Non-tender Ext: No clubbing cyanosis, edema Neuro:  normal strength Skin: No rashes, warm and dry Psych: normal mood and behavior   Assessment/Plan  No problem-specific Assessment & Plan notes found for this encounter.    Magdalen Spatz, NP 12/04/2021  10:58 AM

## 2021-12-04 NOTE — Patient Instructions (Signed)
It is good to see you today.  Your CT Chest continues to improve, showing near resolution of the Right Lower Lobe Nodule. This is great news.  We will do a 6 month follow up CT Chest as surveillance of the 4 mm LLL nodule  Good Luck with your upcoming surgery.  Follow up as needed.  Continue Flonase, Zyrtec and omeprazole.  Have a Happy Thanksgiving.  Please contact office for sooner follow up if symptoms do not improve or worsen or seek emergency care

## 2021-12-05 ENCOUNTER — Encounter: Payer: Self-pay | Admitting: Acute Care

## 2021-12-10 ENCOUNTER — Ambulatory Visit: Payer: Self-pay | Admitting: Surgery

## 2021-12-10 DIAGNOSIS — N6092 Unspecified benign mammary dysplasia of left breast: Secondary | ICD-10-CM

## 2021-12-13 ENCOUNTER — Other Ambulatory Visit (HOSPITAL_COMMUNITY): Payer: Self-pay

## 2021-12-14 ENCOUNTER — Other Ambulatory Visit (HOSPITAL_COMMUNITY): Payer: Self-pay

## 2021-12-14 ENCOUNTER — Other Ambulatory Visit: Payer: Self-pay | Admitting: Gastroenterology

## 2021-12-14 MED ORDER — OMEPRAZOLE 40 MG PO CPDR
40.0000 mg | DELAYED_RELEASE_CAPSULE | Freq: Every day | ORAL | 1 refills | Status: DC
  Filled 2021-12-14 – 2021-12-17 (×2): qty 30, 30d supply, fill #0

## 2021-12-14 NOTE — Progress Notes (Unsigned)
[name] [place] Phone: [phone] Subjective:   [name], am serving as a scribe for Dr. Hulan Saas.  I'm seeing this patient by the request  of:  [name], Cresenciano Lick, MD  CC: neck and back pain   UKG:[id number]  [name] is a 61 y.o. female coming in with complaint of back and neck pain. OMT 10/30/2021. Patient states Bilateral shoulder pain L>R.   Medications patient has been prescribed: None  Taking: Reviewing patient's chart unfortunately did have an abnormal mammogram recently and is scheduled to have radioactive seed test done.  Biopsy only showed hyperplasia.  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        Reviewed prior external information including notes and imaging from previsou exam, outside providers and external EMR if available.   As well as notes that were available from care everywhere and other healthcare systems.  Past medical history, social, surgical and family history all reviewed in electronic medical record.  No pertanent information unless stated regarding to the chief complaint.   Past Medical History:  Diagnosis Date   Anxiety    Allergy induced asthma   Arthritis    Asthma    Crohn disease ([place])    Depression    GERD (gastroesophageal reflux disease)    History of methicillin resistant staphylococcus aureus (MRSA)    Multiple thyroid nodules    Sleep apnea    CPAP    Allergies  Allergen Reactions   Chlorhexidine Gluconate Itching   Gabapentin Itching   Hydrocodone Itching   Oxycodone Hcl Itching    NDC SEGB:[id number]  NDC TGGY:[id number]     Review of Systems:  No headache, visual changes, nausea, vomiting, diarrhea, constipation, dizziness, abdominal pain, skin rash, fevers, chills, night sweats, weight loss, swollen lymph nodes,  joint swelling, chest pain, shortness of breath, mood  changes. POSITIVE muscle aches, body aches  Objective  Blood pressure 122/70, pulse 74, height 5' 2"  (1.575 m), weight 145 lb (65.8 kg), last menstrual period 01/10/2014, SpO2 96 %.   General: No apparent distress alert and oriented x3 mood and affect normal, dressed appropriately.  HEENT: Pupils equal, extraocular movements intact  Respiratory: Patient's speak in full sentences and does not appear short of breath  Cardiovascular: No lower extremity edema, non tender, no erythema    Osteopathic findings  C2 flexed rotated and side bent right C6 flexed rotated and side bent left T3 extended rotated and side bent right inhaled rib T8 extended rotated and side bent left L2 flexed rotated and side bent right Sacrum right on right       Assessment and Plan:  Lumbar radiculopathy Responded well to osteopathic manipulation.  At the moment the patient has other things that she needs further evaluation.  Patient is going to be having surgery for her thyroid carcinoma as well as the breast mass.  Patient was planning on doing at the 19th but is having scheduling conflict due to them wanting to do it at the same time as the thyroid.  Discussed with patient that he would like to see her again in 5 to 6 weeks afterwards.  Patient is in agreement and knows we are here if she has any other questions or concerns.  Patient is taking Cymbalta 60 mg daily, has Celebrex but is not taking it at this point only due to her having surgery in  the near future.  Follow-up with me 5 to 6 weeks after surgery    Nonallopathic problems  Decision today to treat with OMT was based on Physical Exam  After verbal consent patient was treated with HVLA, ME, FPR techniques in cervical, rib, thoracic, lumbar, and sacral  areas  Patient tolerated the procedure well with improvement in symptoms  Patient given exercises, stretches and lifestyle modifications  See medications in patient instructions if given  Patient  will follow up in 4-8 weeks     The above documentation has been reviewed and is accurate and complete Lyndal Pulley, DO         Note: This dictation was prepared with Dragon dictation along with smaller phrase technology. Any transcriptional errors that result from this process are unintentional.

## 2021-12-17 ENCOUNTER — Other Ambulatory Visit (HOSPITAL_COMMUNITY): Payer: Self-pay

## 2021-12-17 ENCOUNTER — Ambulatory Visit (INDEPENDENT_AMBULATORY_CARE_PROVIDER_SITE_OTHER): Payer: 59 | Admitting: Family Medicine

## 2021-12-17 VITALS — BP 122/70 | HR 74 | Ht 62.0 in | Wt 145.0 lb

## 2021-12-17 DIAGNOSIS — M9902 Segmental and somatic dysfunction of thoracic region: Secondary | ICD-10-CM | POA: Diagnosis not present

## 2021-12-17 DIAGNOSIS — M9904 Segmental and somatic dysfunction of sacral region: Secondary | ICD-10-CM

## 2021-12-17 DIAGNOSIS — M9908 Segmental and somatic dysfunction of rib cage: Secondary | ICD-10-CM | POA: Diagnosis not present

## 2021-12-17 DIAGNOSIS — M5416 Radiculopathy, lumbar region: Secondary | ICD-10-CM | POA: Diagnosis not present

## 2021-12-17 DIAGNOSIS — M9903 Segmental and somatic dysfunction of lumbar region: Secondary | ICD-10-CM | POA: Diagnosis not present

## 2021-12-17 DIAGNOSIS — M9901 Segmental and somatic dysfunction of cervical region: Secondary | ICD-10-CM | POA: Diagnosis not present

## 2021-12-17 NOTE — Assessment & Plan Note (Signed)
Responded well to osteopathic manipulation.  At the moment the patient has other things that she needs further evaluation.  Patient is going to be having surgery for her thyroid carcinoma as well as the breast mass.  Patient was planning on doing at the 19th but is having scheduling conflict due to them wanting to do it at the same time as the thyroid.  Discussed with patient that he would like to see her again in 5 to 6 weeks afterwards.  Patient is in agreement and knows we are here if she has any other questions or concerns.  Patient is taking Cymbalta 60 mg daily, has Celebrex but is not taking it at this point only due to her having surgery in the near future.  Follow-up with me 5 to 6 weeks after surgery

## 2021-12-17 NOTE — Patient Instructions (Signed)
Good to see you  I will make sure everyone is on the same page [facility] [place] See me back in 5-8 weeks

## 2021-12-18 ENCOUNTER — Other Ambulatory Visit (HOSPITAL_COMMUNITY): Payer: Self-pay

## 2021-12-18 DIAGNOSIS — H10413 Chronic giant papillary conjunctivitis, bilateral: Secondary | ICD-10-CM | POA: Diagnosis not present

## 2021-12-18 DIAGNOSIS — H2513 Age-related nuclear cataract, bilateral: Secondary | ICD-10-CM | POA: Diagnosis not present

## 2021-12-18 DIAGNOSIS — H16423 Pannus (corneal), bilateral: Secondary | ICD-10-CM | POA: Diagnosis not present

## 2021-12-18 DIAGNOSIS — H40013 Open angle with borderline findings, low risk, bilateral: Secondary | ICD-10-CM | POA: Diagnosis not present

## 2021-12-20 ENCOUNTER — Other Ambulatory Visit (HOSPITAL_COMMUNITY): Payer: Self-pay

## 2021-12-20 ENCOUNTER — Encounter: Payer: Self-pay | Admitting: Surgery

## 2021-12-20 DIAGNOSIS — E042 Nontoxic multinodular goiter: Secondary | ICD-10-CM | POA: Diagnosis present

## 2021-12-20 DIAGNOSIS — C73 Malignant neoplasm of thyroid gland: Secondary | ICD-10-CM | POA: Diagnosis present

## 2021-12-20 NOTE — H&P (Signed)
REFERRING PHYSICIAN: Icard, Tressia Danas, MD  PROVIDER: Maryjo Ragon Charlotta Newton, MD   Chief Complaint: New Consultation (Papillary thyroid carcinoma)  History of Present Illness:  Patient is referred by Dr. June Leap for surgical evaluation and management of newly diagnosed papillary thyroid carcinoma. Patient's primary care physician is Dr. Tommie Ard [name]. Patient had undergone a cardiac CT scan as a screening test for cardiovascular disease. Incidental finding was made of a pulmonary nodule. Patient was referred to pulmonology where she underwent a PET scan. PET scan indicated the presence of a thyroid nodule. Patient underwent a thyroid ultrasound on November 08, 2021. This demonstrated a normal-sized thyroid gland with 2 nodules in the right thyroid lobe. The larger nodule measuring 2.2 cm underwent fine-needle aspiration biopsy on November 14, 2021. Findings were consistent with papillary thyroid carcinoma, Bethesda category VI. TSH level is normal at 3.08. Patient has had no prior history of thyroid disease. She has never been on thyroid medication. She has had no prior head or neck surgery. There is no family history of thyroid disease and no family history of thyroid cancer. Patient presents today for evaluation accompanied by her husband who is a cardiologist.  Of note, the pulmonary nodule has decreased in size on sequential scanning. It is felt to be a benign lesion. However, the patient has recently undergone mammography and had a biopsy of a breast lesion performed yesterday. They are awaiting those results. There is concerned that the breast lesion may require surgical intervention as well.  Review of Systems: A complete review of systems was obtained from the patient. I have reviewed this information and discussed as appropriate with the patient. See HPI as well for other ROS.  Review of Systems Constitutional: Negative. HENT: Negative. Eyes: Negative. Respiratory:  Negative. Cardiovascular: Negative. Gastrointestinal: Negative. Genitourinary: Negative. Musculoskeletal: Negative. Skin: Negative. Neurological: Negative. Endo/Heme/Allergies: Negative. Psychiatric/Behavioral: Negative.   Medical History: History reviewed. No pertinent past medical history.  Patient Active Problem List Diagnosis Papillary thyroid carcinoma (CMS-HCC) Multiple thyroid nodules  History reviewed. No pertinent surgical history.  No Known Allergies  No current outpatient medications on file prior to visit.  No current facility-administered medications on file prior to visit.  History reviewed. No pertinent family history.  Social History  Tobacco Use Smoking Status Not on file Smokeless Tobacco Not on file   Social History  Socioeconomic History Marital status: Married  Objective:  There were no vitals filed for this visit. There is no height or weight on file to calculate BMI.  Physical Exam  GENERAL APPEARANCE Comfortable, no acute issues Development: normal Gross deformities: none  SKIN Rash, lesions, ulcers: none Induration, erythema: none Nodules: none palpable  EYES Conjunctiva and lids: normal Pupils: equal and reactive  EARS, NOSE, MOUTH, THROAT External ears: no lesion or deformity External nose: no lesion or deformity Hearing: grossly normal  NECK Symmetric: yes Trachea: midline Thyroid: Palpation of the right thyroid lobe shows a smooth approximately 2.5 cm nodule located centrally, mobile with swallowing, and nontender. Left thyroid lobe is without palpable abnormality. There is no associated lymphadenopathy.  CHEST Respiratory effort: normal Retraction or accessory muscle use: no Breath sounds: normal bilaterally Rales, rhonchi, wheeze: none  CARDIOVASCULAR Auscultation: regular rhythm, normal rate Murmurs: none Pulses: radial pulse 2+ palpable Lower extremity edema: none  ABDOMEN Not  assessed  GENITOURINARY/RECTAL Not assessed  MUSCULOSKELETAL Station and gait: normal Digits and nails: no clubbing or cyanosis Muscle strength: grossly normal all extremities Range of motion: grossly normal  all extremities Deformity: none  LYMPHATIC Cervical: none palpable Supraclavicular: none palpable  PSYCHIATRIC Oriented to person, place, and time: yes Mood and affect: normal for situation Judgment and insight: appropriate for situation   Assessment and Plan:  Papillary thyroid carcinoma (CMS-HCC)  Multiple thyroid nodules  Patient is referred by her pulmonologist and primary care physician for surgical evaluation and management of newly diagnosed papillary thyroid carcinoma.  Patient provided with a copy of "The Thyroid Book: Medical and Surgical Treatment of Thyroid Problems", published by [employer], 16 pages. Book reviewed and explained to patient during visit today.  Today we reviewed her clinical history. We reviewed her ultrasound report and her fine-needle biopsy cytopathology results. We discussed her laboratory studies. We discussed the need for surgical management of papillary thyroid carcinoma. We discussed lobectomy versus total thyroidectomy. We discussed doing a limited central compartment lymph node dissection. We discussed the risk and benefits of each procedure including the risk of recurrent laryngeal nerve injury and injury to parathyroid glands. We discussed the potential need for radioactive iodine treatment. We discussed the potential need for lifelong thyroid hormone replacement. We discussed the size and location of the surgical incision. We discussed the hospital stay to be anticipated. After a lengthy discussion, the patient would like to proceed with total thyroidectomy with limited lymph node dissection for management of her papillary thyroid carcinoma. We also discussed the need for consultation with endocrinology following surgery.  We will await the  results of her breast biopsy and possibly coordinate her procedure with her breast surgeon. We will make arrangements for surgery at a time convenient for the patient in the near future.  Armandina Gemma, MD [facility] Surgery [facility] [place] [facility] Office: [phone]

## 2021-12-24 NOTE — Pre-Procedure Instructions (Signed)
Surgical Instructions    Your procedure is scheduled on Tuesday, December 19.  Report to [facility] Main Entrance "A" at 11:00 A.M., then check in with the Admitting office.  Call this number if you have problems the morning of surgery:  [phone]   If you have any questions prior to your surgery date call [phone]: Open Monday-Friday 8am-4pm If you experience any cold or flu symptoms such as cough, fever, chills, shortness of breath, etc. between now and your scheduled surgery, please notify us at the above number     Remember:  Do not eat after midnight the night before your surgery  You may drink clear liquids until 10:00AM the morning of your surgery.   Clear liquids allowed are: Water, Non-Citrus Juices (without pulp), Carbonated Beverages, Clear Tea, Black Coffee ONLY (NO MILK, CREAM OR POWDERED CREAMER of any kind), and Gatorade    Take these medicines the morning of surgery with A SIP OF WATER:  busPIRone (BUSPAR)  cetirizine (ZYRTEC)  DULoxetine (CYMBALTA)  omeprazole (PRILOSEC)   acetaminophen (TYLENOL) if needed albuterol (VENTOLIN HFA) 108 (90 Base) MCG/ACT inhaler  if needed- Please bring all inhalers with you the day of surgery.    As of today, STOP taking any Aspirin (unless otherwise instructed by your surgeon), celecoxib (CELEBREX)  Aleve, Naproxen, Ibuprofen, Motrin, Advil, Goody's, BC's, all herbal medications, fish oil, and all vitamins.       [facility] is not responsible for any belongings or valuables.    Do NOT Smoke (Tobacco/Vaping)  24 hours prior to your procedure  If you use a CPAP at night, you may bring your mask for your overnight stay.   Contacts, glasses, hearing aids, dentures or partials may not be worn into surgery, please bring cases for these belongings   For patients admitted to the hospital, discharge time will be determined by your treatment team.   Patients discharged the day of surgery will not be allowed to drive home, and  someone needs to stay with them for 24 hours.   SURGICAL WAITING ROOM VISITATION Patients having surgery or a procedure may have no more than 2 support people in the waiting area - these visitors may rotate.   Children under the age of 14 must have an adult with them who is not the patient. If the patient needs to stay at the hospital during part of their recovery, the visitor guidelines for inpatient rooms apply. Pre-op nurse will coordinate an appropriate time for 1 support person to accompany patient in pre-op.  This support person may not rotate.   Please refer to [email or web address] for the visitor guidelines for Inpatients (after your surgery is over and you are in a regular room).    Special instructions:    Oral Hygiene is also important to reduce your risk of infection.  Remember - BRUSH YOUR TEETH THE MORNING OF SURGERY WITH YOUR REGULAR TOOTHPASTE   [facility]- Preparing For Surgery  Before surgery, you can play an important role. Because skin is not sterile, your skin needs to be as free of germs as possible. You can reduce the number of germs on your skin by washing with CHG (chlorahexidine gluconate) Soap before surgery.  CHG is an antiseptic cleaner which kills germs and bonds with the skin to continue killing germs even after washing.     Please do not use if you have an allergy to CHG or antibacterial soaps. If your skin becomes reddened/irritated stop using the CHG.  Do not  shave (including legs and underarms) for at least 48 hours prior to first CHG shower. It is OK to shave your face.  Please follow these instructions carefully.     Shower the NIGHT BEFORE SURGERY and the MORNING OF SURGERY with CHG Soap.   If you chose to wash your hair, wash your hair first as usual with your normal shampoo. After you shampoo, rinse your hair and body thoroughly to remove the shampoo.  Then [employer] and genitals (private parts)  with your normal soap and rinse thoroughly to remove soap.  After that Use CHG Soap as you would any other liquid soap. You can apply CHG directly to the skin and wash gently with a scrungie or a clean washcloth.   Apply the CHG Soap to your body ONLY FROM THE NECK DOWN.  Do not use on open wounds or open sores. Avoid contact with your eyes, ears, mouth and genitals (private parts). Wash Face and genitals (private parts)  with your normal soap.   Wash thoroughly, paying special attention to the area where your surgery will be performed.  Thoroughly rinse your body with warm water from the neck down.  DO NOT shower/wash with your normal soap after using and rinsing off the CHG Soap.  Pat yourself dry with a CLEAN TOWEL.  Wear CLEAN PAJAMAS to bed the night before surgery  Place CLEAN SHEETS on your bed the night before your surgery  DO NOT SLEEP WITH PETS.   Day of Surgery:  Take a shower with CHG soap. Wear Clean/Comfortable clothing the morning of surgery Do not wear jewelry or makeup. Do not wear lotions, powders, perfumes/cologne or deodorant. Do not shave 48 hours prior to surgery.  Men may shave face and neck. Do not bring valuables to the hospital. Do not wear nail polish, gel polish, artificial nails, or any other type of covering on natural nails (fingers and toes) If you have artificial nails or gel coating that need to be removed by a nail salon, please have this removed prior to surgery. Artificial nails or gel coating may interfere with anesthesia's ability to adequately monitor your vital signs.  Remember to brush your teeth WITH YOUR REGULAR TOOTHPASTE.    If you received a COVID test during your pre-op visit, it is requested that you wear a mask when out in public, stay away from anyone that may not be feeling well, and notify your surgeon if you develop symptoms. If you have been in contact with anyone that has tested positive in the last 10 days, please notify your  surgeon.    Please read over the following fact sheets that you were given.

## 2021-12-25 ENCOUNTER — Other Ambulatory Visit: Payer: Self-pay

## 2021-12-25 ENCOUNTER — Encounter (HOSPITAL_COMMUNITY)
Admission: RE | Admit: 2021-12-25 | Discharge: 2021-12-25 | Disposition: A | Discharge status: Home / Self Care | Payer: 59 | Source: Ambulatory Visit | Attending: Surgery | Admitting: Surgery

## 2021-12-25 ENCOUNTER — Encounter (HOSPITAL_COMMUNITY): Payer: Self-pay

## 2021-12-25 VITALS — BP 124/50 | HR 75 | Temp 98.0°F | Resp 17 | Ht 62.0 in | Wt 149.3 lb

## 2021-12-25 DIAGNOSIS — J45909 Unspecified asthma, uncomplicated: Secondary | ICD-10-CM | POA: Diagnosis not present

## 2021-12-25 DIAGNOSIS — K5090 Crohn's disease, unspecified, without complications: Secondary | ICD-10-CM | POA: Diagnosis not present

## 2021-12-25 DIAGNOSIS — J351 Hypertrophy of tonsils: Secondary | ICD-10-CM | POA: Insufficient documentation

## 2021-12-25 DIAGNOSIS — Z01411 Encounter for gynecological examination (general) (routine) with abnormal findings: Secondary | ICD-10-CM | POA: Diagnosis not present

## 2021-12-25 DIAGNOSIS — Z01812 Encounter for preprocedural laboratory examination: Secondary | ICD-10-CM | POA: Diagnosis not present

## 2021-12-25 DIAGNOSIS — Z6827 Body mass index (BMI) 27.0-27.9, adult: Secondary | ICD-10-CM | POA: Diagnosis not present

## 2021-12-25 DIAGNOSIS — Z85850 Personal history of malignant neoplasm of thyroid: Secondary | ICD-10-CM | POA: Insufficient documentation

## 2021-12-25 DIAGNOSIS — Z113 Encounter for screening for infections with a predominantly sexual mode of transmission: Secondary | ICD-10-CM | POA: Diagnosis not present

## 2021-12-25 DIAGNOSIS — Z124 Encounter for screening for malignant neoplasm of cervix: Secondary | ICD-10-CM | POA: Diagnosis not present

## 2021-12-25 DIAGNOSIS — C73 Malignant neoplasm of thyroid gland: Secondary | ICD-10-CM | POA: Diagnosis not present

## 2021-12-25 DIAGNOSIS — K219 Gastro-esophageal reflux disease without esophagitis: Secondary | ICD-10-CM | POA: Insufficient documentation

## 2021-12-25 DIAGNOSIS — R911 Solitary pulmonary nodule: Secondary | ICD-10-CM | POA: Insufficient documentation

## 2021-12-25 DIAGNOSIS — Z01818 Encounter for other preprocedural examination: Secondary | ICD-10-CM

## 2021-12-25 DIAGNOSIS — Z01419 Encounter for gynecological examination (general) (routine) without abnormal findings: Secondary | ICD-10-CM | POA: Diagnosis not present

## 2021-12-25 HISTORY — DX: Malignant neoplasm of thyroid gland: C73

## 2021-12-25 LAB — CBC
HCT: 38.1 % (ref 36.0–46.0)
Hemoglobin: 12.4 g/dL (ref 12.0–15.0)
MCH: 30.5 pg (ref 26.0–34.0)
MCHC: 32.5 g/dL (ref 30.0–36.0)
MCV: 93.8 fL (ref 80.0–100.0)
Platelets: 270 10*3/uL (ref 150–400)
RBC: 4.06 MIL/uL (ref 3.87–5.11)
RDW: 13.2 % (ref 11.5–15.5)
WBC: 5.8 10*3/uL (ref 4.0–10.5)
nRBC: 0 % (ref 0.0–0.2)

## 2021-12-25 NOTE — Progress Notes (Addendum)
PCP - Dr. Tedra Senegal Cardiologist - denies  PPM/ICD - n/a  Chest x-ray - n/a EKG - n/a Stress Test - denies ECHO - denies Cardiac Cath - denies  Sleep Study - OSA+ CPAP - uses nightly  Last dose of GLP1 agonist-  n/a GLP1 instructions: n/a  Blood Thinner Instructions: n/a Aspirin Instructions: n/a  ERAS Protcol -Clear liquids until 1000 DOS PRE-SURGERY Ensure or G2- None ordered.   COVID TEST- n/a  Anesthesia review: Yes, seed placement  Patient denies shortness of breath, fever, cough and chest pain at PAT appointment   All instructions explained to the patient, with a verbal understanding of the material. Patient agrees to go over the instructions while at home for a better understanding. Patient also instructed to self quarantine after being tested for COVID-19. The opportunity to ask questions was provided.

## 2021-12-26 ENCOUNTER — Encounter (HOSPITAL_COMMUNITY): Payer: Self-pay

## 2021-12-26 NOTE — Progress Notes (Signed)
Anesthesia Chart Review:  Case: [id number] Date/Time: 01/01/22 1246   Procedures:      LEFT BREAST SEED LUMPECTOMY (Left: Breast)     TOTAL THYROIDECTOMY WITH LIMITED LYMPH NODE DISSECTION   Anesthesia type: General   Pre-op diagnosis: ATYPICAL LOBULAR HYPERPLASIA AND PAPILLARY THYROID CARCINOMA   Location: [facility] [facility] OR ROOM 02 / [place] OR   Surgeons: Erroll Luna, MD; Armandina Gemma, MD       DISCUSSION: Patient is a 61 year old female scheduled for the above procedures.  History includes never smoker, asthma, OSA (uses CPAP), Crohn's disease, GERD, thyroid cancer (right nodule + papillary carcinoma 11/14/21), ventral hernia (s/p laparoscopic [facility] 07/26/20), RLL lung nodule (10/15/21, followed by pulmonology).  Last pulmonology visit 12/04/21 with Eric Form, NP. Patient had an incidental finding of RLL lung nodule on CT cardiac scoring scan.  It was minimally hypermetabolic on PET scan, other findings includes a hypermetabolic right thyroid nodule.  Follow-up Super D Chest done on 10/30/21 showed the nodule had decreased in size and favored to reflect resolving infectious/inflammatory process. Repeat chest CT on 11/30/21 showed nearly resolved RLL nodule but persistent 3-4 mm LLL nodule with 12 month imaging recommended. Repeat CT in 6 months is planned. She noted plans for thyroid surgery.   S/p right thyroid nodule FNA 11/14/21: papillary carcinoma (Bethesda category VI). TSH 3.08 on 08/30/21.   Possible esophagitis on 10/29/21 PET scan. S/p EGD 11/09/21 showing LA grade A reflux esophagitis, benign appearing esophageal stenosis, s/p dilation, esophageal mucosal changes suspicious for possible eosinophilic esophagitis vs. reflux changes, normal stomach and examined duodenum. Per Dr. 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 Josetta Huddle notes, "There was some discordance from the report and pathology therefore recommend left breast seed lumpectomy." She also need a thyroidectomy for recent diagnosis of thyroid cancer, so breast lumpectomy and thyroidectomy scheduled together.   RSL is scheduled for 12/31/21 at 2:30 PM. Anesthesia team to evaluate on the day of surgery.   VS: BP (!) 124/50   Pulse 75   Temp 36.7 C   Resp 17   Ht 5' 2"  (1.575 m)   Wt 67.7 kg   LMP 01/10/2014   SpO2 98%   BMI 27.31 kg/m  Post-menopausal.   PROVIDERS: Elby Showers, MD is PCP  June Leap, DO is pulmonologist [place] Cellar, MD is GI. She has also seen Weber Cooks, MD at [place].     LABS: Labs reviewed: Acceptable for surgery. Normal CMP and TSH at 3.08 on 08/30/21.  (all labs ordered are listed, but only abnormal results are displayed)  Labs Reviewed  CBC    PFTs 10/22/21: FVC 3.49 (114%), post 3.50 (115%). FEV1 2.58 (110%), post 2.67 (113%). DLCO unc 19.15 (101%).   IMAGES: CT Chest 11/30/21: IMPRESSION: 1. No acute cardiopulmonary disease. 2. Near complete resolution of previously seen part solid nodule over the right lower lobe and resolution of previous seen subcentimeter ground-glass nodule over the left lower lobe as these can be considered benign. Persistent 3-4 mm nodule over the posterior left lower lobe as consider follow-up noncontrast chest CT 1 year. This recommendation follows the consensus statement: Guidelines for Management of Small Pulmonary Nodules Detected on CT Scans: A Statement from the [place] as published in Radiology 2005; 237:395-400. Online at: https://www.arnold.com/. 3. Aortic atherosclerosis. 4. Colonic  diverticulosis.  US Thyroid 11/08/21: IMPRESSION: 1. Approximately 2.2 cm TI-RADS category 5 nodule in the right upper gland meets  criteria to consider fine-needle aspiration biopsy. Biopsy is recommended. 2. A 1.3 cm TI-RADS category 4 nodule in the right mid gland meets criteria for imaging surveillance. Recommend follow-up ultrasound in 1 year.  PET Scan 10/29/21: IMPRESSION: 1. Minimally metabolic 17 mm mixed density right lower lobe pulmonary nodule may reflect an infectious or inflammatory etiology, but given its morphology a very low-grade primary bronchogenic adenocarcinoma remains a pertinent differential consideration. Consider referral to multidisciplinary tumor board and dedicated chest CT surveillance imaging or versus direct tissue sampling. 2. Hypermetabolic heterogeneous 19 mm right thyroid nodule, suggest further evaluation with thyroid ultrasound and FNA. 3. Hypermetabolic mild symmetric distal esophageal wall thickening with a max SUV of 4.3, possibly reflecting esophagitis. Consider further evaluation with endoscopy. 4. Hypermetabolic hyperplasia of the tonsils slightly asymmetric to the left, nonspecific but commonly reactive. Consider correlation with direct visualization. 5. Focus of hypermetabolic activity about the left acromioclavicular joint without underlying osseous lesion identified, favored inflammatory. 6. Minimally metabolic right posterior gluteal soft tissue nodularity likely reflects sequela of subcutaneous injections or prior trauma. 7. Colonic diverticulosis without findings of acute diverticulitis. 8. Nodular uterine contour likely reflects leiomyomas.    EKG: N/A   CV: CT Coronary Calcium Scoring 10/15/21: IMPRESSION: Coronary calcium score of 0.    Past Medical History:  Diagnosis Date   Anxiety    Allergy induced asthma   Arthritis    Asthma    Crohn disease ([place])    Depression    GERD (gastroesophageal reflux disease)    History of methicillin resistant staphylococcus aureus (MRSA)    pt denies this.   Multiple thyroid nodules    Sleep apnea    CPAP    Thyroid cancer [facility])     Past Surgical History:  Procedure Laterality Date   ANAL FISSURE REPAIR  2004   CESAREAN SECTION     COLONOSCOPY     INSERTION OF MESH N/A 07/26/2020   Procedure: INSERTION OF MESH;  Surgeon: Ralene Ok, MD;  Location: [place];  Service: General;  Laterality: N/A;   LIGAMENT REPAIR Right 11/23/2015   Procedure: right index radial collateral LIGAMENT REPAIR;  Surgeon: Leanora Cover, MD;  Location: [place];  Service: Orthopedics;  Laterality: Right;   UPPER ENDOSCOPY W/ ESOPHAGEAL MANOMETRY  10/2021   VENTRAL HERNIA REPAIR N/A 07/26/2020   Procedure: LAPAROSCOPIC VENTRAL HERNIA REPAIR WITH MESH;  Surgeon: Ralene Ok, MD;  Location: [place];  Service: General;  Laterality: N/A;    MEDICATIONS:  acetaminophen (TYLENOL) 500 MG tablet   Adalimumab (HUMIRA) 40 MG/0.4ML PSKT   albuterol (VENTOLIN HFA) 108 (90 Base) MCG/ACT inhaler   ascorbic acid (VITAMIN C) 500 MG tablet   bimatoprost (LUMIGAN) 0.01 % SOLN   busPIRone (BUSPAR) 7.5 MG tablet   celecoxib (CELEBREX) 100 MG capsule   cetirizine (ZYRTEC) 10 MG tablet   DULoxetine (CYMBALTA) 60 MG capsule   estradiol (VIVELLE-DOT) 0.05 MG/24HR patch   fluocinonide ointment (LIDEX) 0.05 %   fluticasone (FLONASE) 50 MCG/ACT nasal spray   mupirocin ointment (BACTROBAN) 2 %   omeprazole (PRILOSEC) 40 MG capsule   omeprazole (PRILOSEC) 40 MG capsule   progesterone (PROMETRIUM) 100 MG capsule   Vitamin D, Ergocalciferol, (DRISDOL) 1.25 MG (50000 UNIT) CAPS capsule    0.9 %  sodium chloride infusion    Myra Gianotti, PA-C Surgical Short Stay/Anesthesiology [facility] Phone [phone] [facility]  Phone [phone] 12/26/2021 2:38 PM

## 2021-12-26 NOTE — Anesthesia Preprocedure Evaluation (Addendum)
Anesthesia Evaluation  Patient identified by MRN, date of birth, ID band Patient awake    Reviewed: Allergy & Precautions, H&P , NPO status , Patient's Chart, lab work & pertinent test results  Airway Mallampati: III  TM Distance: >3 FB Neck ROM: Full    Dental no notable dental hx. (+) Teeth Intact, Dental Advisory Given   Pulmonary asthma , sleep apnea and Continuous Positive Airway Pressure Ventilation    Pulmonary exam normal breath sounds clear to auscultation       Cardiovascular Exercise Tolerance: Good negative cardio ROS  Rhythm:Regular Rate:Normal     Neuro/Psych  PSYCHIATRIC DISORDERS Anxiety Depression     Neuromuscular disease    GI/Hepatic Neg liver ROS,GERD  Medicated,,Crohn's disease on Humira   Endo/Other  negative endocrine ROS  Atypical lobular hyperplasia left breast Papillary thyroid Ca  Renal/GU negative Renal ROS  negative genitourinary   Musculoskeletal  (+) Arthritis ,    Abdominal   Peds  Hematology negative hematology ROS (+)   Anesthesia Other Findings   Reproductive/Obstetrics negative OB ROS                              Anesthesia Physical Anesthesia Plan  ASA: 3  Anesthesia Plan: General   Post-op Pain Management:    Induction: Intravenous  PONV Risk Score and Plan: 4 or greater and Ondansetron, Dexamethasone and Midazolam  Airway Management Planned: Oral ETT  Additional Equipment:   Intra-op Plan:   Post-operative Plan: Extubation in OR  Informed Consent: I have reviewed the patients History and Physical, chart, labs and discussed the procedure including the risks, benefits and alternatives for the proposed anesthesia with the patient or authorized representative who has indicated his/her understanding and acceptance.     Dental advisory given  Plan Discussed with: CRNA  Anesthesia Plan Comments: (PAT note written 12/26/2021 by Myra Gianotti, PA-C.  )         Anesthesia Quick Evaluation

## 2021-12-27 ENCOUNTER — Encounter: Payer: Self-pay | Admitting: Allergy & Immunology

## 2021-12-27 ENCOUNTER — Other Ambulatory Visit: Payer: Self-pay

## 2021-12-27 ENCOUNTER — Ambulatory Visit (INDEPENDENT_AMBULATORY_CARE_PROVIDER_SITE_OTHER): Payer: 59 | Admitting: Allergy & Immunology

## 2021-12-27 VITALS — BP 108/74 | HR 67 | Temp 98.3°F | Resp 16 | Ht 62.0 in | Wt 149.4 lb

## 2021-12-27 DIAGNOSIS — J3089 Other allergic rhinitis: Secondary | ICD-10-CM | POA: Diagnosis not present

## 2021-12-27 DIAGNOSIS — K200 Eosinophilic esophagitis: Secondary | ICD-10-CM

## 2021-12-27 DIAGNOSIS — J302 Other seasonal allergic rhinitis: Secondary | ICD-10-CM

## 2021-12-27 MED ORDER — FLUTICASONE PROPIONATE 50 MCG/ACT NA SUSP
2.0000 | Freq: Every day | NASAL | 1 refills | Status: DC
  Filled 2021-12-27: qty 16, 30d supply, fill #0
  Filled 2022-02-15: qty 16, 30d supply, fill #1

## 2021-12-27 NOTE — Patient Instructions (Addendum)
1. Eosinophilic esophagitis - Food allergen skin prick tests were negative today despite a positive histamine control.  - Therefore, there is no food trigger that might be making your symptoms worse.  - Copy of testing provided. - Dr. Havery Moros may want to re-endoscopy you to see if the omeprazole decreased to eosinophils in your esophagus.  - I am going to chat with him to see which direction he is leaning.  - Depending on what his next steps are, we have a few options for treatment:  A trial six food elimination diet (milk, soy, eggs, wheat, peanuts/tree nuts, and seafood). A trial elimination of all milk products (almost as effective as #1). Swallowed steroids (Flovent, Asmanex, or Budesonide). Dupixent (handout provided)  2. Seasonal and perennial allergic rhinitis - Testing today showed: trees, indoor molds, and dust mites - Copy of test results provided.  - Avoidance measures provided. - We did not do intradermal testing today, but we can consider that in the future (needles under the arm, which is more sensitive than what we did today).  - Continue with: Zyrtec (cetirizine) 91m tablet once daily and Flonase (fluticasone) one spray per nostril daily (AIM FOR EAR ON EACH SIDE) - Consider allergy shots as a means of long-term control. - Allergy shots "re-train" and "reset" the immune system to ignore environmental allergens and decrease the resulting immune response to those allergens (sneezing, itchy watery eyes, runny nose, nasal congestion, etc).    - Allergy shots improve symptoms in 75-85% of patients.  - We can discuss more at the next appointment if the medications are not working for you.  3. Return in about 2 months (around 02/27/2022).    Please inform [place] any Emergency Department visits, hospitalizations, or changes in symptoms. Call [place] going to the ED for breathing or allergy symptoms since we might be able to fit you in for a sick visit. Feel free to contact [facility] anytime with any questions, problems, or concerns.  It was a pleasure to meet you today! You are a hoot!   Websites that have reliable patient information: 1. American Academy of Asthma, Allergy, and Immunology: www.aaaai.org 2. Food Allergy Research and Education (FARE): foodallergy.org 3. Mothers of Asthmatics: http://www.asthmacommunitynetwork.org 4. American College of Allergy, Asthma, and Immunology: www.acaai.org   COVID-19 Vaccine Information can be found at: [email or web address] questions related to vaccine distribution or appointments, please email [email or web address] or call [phone]   We realize that you might be concerned about having an allergic reaction to the COVID19 vaccines. To help with that concern, WE ARE OFFERING THE COVID19 VACCINES IN OUR OFFICE! Ask the front desk for dates!     "Like" [facility] Facebook and Instagram for our latest updates!      A healthy democracy works best when ANew [employer] Life Insuranceparticipate! Make sure you are registered to vote! If you have moved or changed any of your contact information, you will need to get this updated before voting!  In some cases, you MAY be able to register to vote online: [email or web address]      Airborne Adult Perc - 12/27/21 1533     Time Antigen Placed 1533    Allergen Manufacturer GLavella Hammock   Location Back    Number of Test 59    Panel 1 Select    1. Control-Buffer 50% Glycerol Negative    2. Control-Histamine 1 mg/ml 2+    3. Albumin saline Negative    4. BBagdadNegative    5.  [place] Negative    6. Johnson Negative    7. Garey Blue Negative    8. Meadow Fescue Negative    9. Perennial Rye Negative    10. Sweet Vernal Negative    11. Timothy Negative    12. Cocklebur Negative    13. Burweed Marshelder Negative    14. Ragweed, short Negative    15. Ragweed, Giant Negative    16. Plantain,  English Negative     17. Lamb's Quarters Negative    18. Sheep Sorrell Negative    19. Rough Pigweed Negative    20. Marsh Elder, Rough Negative    21. Mugwort, Common Negative    22. Ash mix Negative    23. Birch mix Negative    24. Beech American Negative    25. Box, Elder Negative    26. Cedar, red Negative    27. Cottonwood, [place] Negative    28. Elm mix Negative    29. Hickory Negative    30. Maple mix Negative    31. Oak, [place] mix 2+    32. Pecan Pollen 2+    33. Pine mix Negative    34. Sycamore Eastern Negative    35. Screven, Black Pollen Negative    36. Alternaria alternata Negative    37. Cladosporium Herbarum Negative    38. Aspergillus mix Negative    39. Penicillium mix Negative    40. Bipolaris sorokiniana (Helminthosporium) Negative    41. Drechslera spicifera (Curvularia) Negative    42. Mucor plumbeus Negative    43. Fusarium moniliforme Negative    44. Aureobasidium pullulans (pullulara) Negative    45. Rhizopus oryzae Negative    46. Botrytis cinera 2+    47. Epicoccum nigrum Negative    48. Phoma betae Negative    49. Candida Albicans Negative    50. Trichophyton mentagrophytes Negative    51. Mite, D Farinae  5,000 AU/ml 3+    52. Mite, D Pteronyssinus  5,000 AU/ml 3+    53. Cat Hair 10,000 BAU/ml Negative    54.  Dog Epithelia Negative    55. Mixed Feathers Negative    56. Horse Epithelia Negative    57. Cockroach, German Negative    58. Mouse Negative    59. Tobacco Leaf Negative             Food Adult Perc - 12/27/21 1500     Time Antigen Placed 1534    Allergen Manufacturer Lavella Hammock    Location Back    Number of allergen test 72     Control-buffer 50% Glycerol Negative    Control-Histamine 1 mg/ml 2+    1. Peanut Negative    2. Soybean Negative    3. Wheat Negative    4. Sesame Negative    5. Milk, cow Negative    6. Egg White, Chicken Negative    7. Casein Negative    8. Shellfish Mix Negative    9. Fish Mix Negative    10. Cashew Negative     11. Pecan Food Negative    12. [place] Negative    13. Almond Negative    14. Hazelnut Negative    15. Bol[place] nut Negative    16. Coconut Negative    17. Pistachio Negative    18. Catfish Negative    19. Bass Negative    20. Trout Negative    21. Tuna Negative    22. Salmon Negative    23. Flounder  Negative    24. Codfish Negative    25. Shrimp Negative    26. Crab Negative    27. Lobster Negative    28. Oyster Negative    29. Scallops Negative    30. Barley Negative    31. Oat  Negative    32. Rye  Negative    33. Hops Negative    34. Rice Negative    35. Cottonseed Negative    36. Saccharomyces Cerevisiae  Negative    37. Pork Negative    38. [place] Meat Negative    39. Chicken Meat Negative    40. Beef Negative    41. Lamb Negative    42. Tomato Negative    43. White Potato Negative    44. Sweet Potato Negative    45. Pea, Green/English Negative    46. Navy Bean Negative    47. Mushrooms Negative    48. Avocado Negative    49. Onion Negative    50. Cabbage Negative    51. Carrots Negative    52. Celery Negative    53. Corn Negative    54. Cucumber Negative    55. Grape (White seedless) Negative    56. Orange  Negative    57. Banana Negative    58. Apple Negative    59. Peach Negative    60. Strawberry Negative    61. Cantaloupe Negative    62. Watermelon Negative    63. Pineapple Negative    64. Chocolate/Cacao bean Negative    65. Karaya Gum Negative    66. Acacia (Arabic Gum) Negative    67. Cinnamon Negative    68. Nutmeg Negative    69. Ginger Negative    70. Garlic Negative    71. Pepper, black Negative    72. Mustard Negative

## 2021-12-27 NOTE — Progress Notes (Signed)
NEW PATIENT  Date of Service/Encounter:  12/27/21  Consult requested by: Elby Showers, MD   Assessment:   Eosinophilic esophagitis - with negative testing to the entire panel  Seasonal and perennial allergic rhinitis (trees, indoor molds, and dust mites   Testing today was largely unremarkable.  She had negative testing to the entire food panel.  We did discuss the etiology of eosinophilic esophagitis and I emphasized that this is not always related to anything we could find traditional allergy testing.  We did discuss a 6 very low nation diet versus a lamination of dairy alone.  It seems that dairy would be her biggest hold-up, as she does enjoy cheese and other dairy products.  We did discuss Dupixent as a means of long-term management as well.  I will touch base with her gastroenterologist to see where he prefers to go with her treatment.  I also emphasized that he might want to rescoped her to see if this was all just related to uncontrolled GERD.  Plan/Recommendations:   1. Eosinophilic esophagitis - Food allergen skin prick tests were negative today despite a positive histamine control.  - Therefore, there is no food trigger that might be making your symptoms worse.  - Copy of testing provided. - Dr. Havery Moros may want to re-endoscopy you to see if the omeprazole decreased to eosinophils in your esophagus.  - I am going to chat with him to see which direction he is leaning.  - Depending on what his next steps are, we have a few options for treatment:  A trial six food elimination diet (milk, soy, eggs, wheat, peanuts/tree nuts, and seafood). A trial elimination of all milk products (almost as effective as #1). Swallowed steroids (Flovent, Asmanex, or Budesonide). Dupixent (handout provided)  2. Seasonal and perennial allergic rhinitis - Testing today showed: trees, indoor molds, and dust mites - Copy of test results provided.  - Avoidance measures provided. - We did  not do intradermal testing today, but we can consider that in the future (needles under the arm, which is more sensitive than what we did today).  - Continue with: Zyrtec (cetirizine) 74m tablet once daily and Flonase (fluticasone) one spray per nostril daily (AIM FOR EAR ON EACH SIDE) - Consider allergy shots as a means of long-term control. - Allergy shots "re-train" and "reset" the immune system to ignore environmental allergens and decrease the resulting immune response to those allergens (sneezing, itchy watery eyes, runny nose, nasal congestion, etc).    - Allergy shots improve symptoms in 75-85% of patients.  - We can discuss more at the next appointment if the medications are not working for you.  3. Return in about 2 months (around 02/27/2022).    This note in its entirety was forwarded to the Provider who requested this consultation.  Subjective:   DSHAKYRA MATTERAis a 61y.o. female presenting today for evaluation of  Chief Complaint  Patient presents with   ALLERGIC RHINOCONJUNCTIVITIS    DARIANAH TORGESONhas a history of the following: Patient Active Problem List   Diagnosis Date Noted   Multiple thyroid nodules 12/20/2021   [place]ap[place] 12/20/[place]   Nodule of lower lobe of right lung 10/17/2021   Left upper lobe pulmonary nodule 10/17/2021   Acromioclavicular sprain, left, initial encounter 07/27/2021   Tibialis posterior tendinitis, right 06/15/2020   Piriformis syndrome of left side 095/62/1308  Umbilical hernia 065/78/4696  Lumbar radiculopathy 07/09/2019   Contusion of  right hip and thigh 04/20/2019   Loss of transverse plantar arch 02/11/2019   Sprain of iliolumbar ligament 10/29/2018   Piriformis syndrome of right side 09/30/2018   Nonallopathic lesion of sacral region 09/30/2018   Nonallopathic lesion of lumbosacral region 09/30/2018   Nonallopathic lesion of thoracic region 09/30/2018   Peroneal tendinitis of left lower extremity  02/11/2018   Polyarthralgia 02/11/2018   Patellofemoral syndrome of right knee 02/11/2018   Gastroesophageal reflux disease 01/16/2017   Cough variant asthma 01/16/2017   Noncompliance with medication treatment due to intermittent use of medication 01/16/2017   Allergic rhinoconjunctivitis 11/08/2015   Sinobronchitis 10/03/2015   OSA (obstructive sleep apnea) 12/14/2014   Crohn's disease of colon ([place]) 05/24/2014   Depression 02/01/2014   Disorder of intestine 12/17/2010   Anorectal polyp 12/17/2010   Glaucoma 01/14/2010   Dry skin dermatitis 01/15/2008   Crohn's disease ([place]) 01/14/2005    History obtained from: chart review and patient.  Campbell Lerner Millican was referred by Elby Showers, MD.     Reannah is a 61 y.o. female presenting for an evaluation of food allergies as a cause of her EoE . She ended up getting a coronary artery scan in October 2023. This showed that she might have lung cancer, but this got better on further visualization. She is followed by Dr. Valeta Harms. She was diagnosed with thyroid cancer and she is having surgery next week. Her throat "lit up" on the scan as well. She does report that she is having a sore throat for a while. It hurts constantly. This was keeping her up at night and was awful. It had been sore for at least one month when she had the CT scan.   She went to see Dr. Havery Moros who did an endoscopy and this is when the EoE was diagnosed. He did dilate a stricture and took biopsies. She was not on PPIs at the time of the biopsy. Biopsy demonstrated 20 eosinophils per high power field. She is currently on omeprazole and then she was referred to see [place] for evaluation of possible food triggers of her EoE. She is unsure of the plan to scope her again. She does report that her throat feels better after the omeprazole. She estimates that she is 75% improved.   Biopsy shown below:    She does not drink a lot of milk, but she does eat cheese. She has tried to cut  back on that but it is difficult. She loves cheese and other dairy products.   She does have OSA which is when she was told to take Pepcid once daily. She stopped it when she started the omeprazole. She does use a CPAP machine for her OSA.   Allergic Rhinitis Symptom History: She does have some allergic rhinitis symptoms including cats as well as oak and pecan trees. She was also allergic to dust mites. She was not on shots. Since she has stopped taking her cetirizine, her itching has worsened. She has been on cetirizine for years. She is on Flonase.  Otherwise, there is no history of other atopic diseases, including asthma, drug allergies, stinging insect allergies, or contact dermatitis. There is no significant infectious history. Vaccinations are up to date.    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   Nodule of lower lobe of right lung 10/17/2021   Left upper lobe pulmonary nodule 10/17/2021  Acromioclavicular sprain, left, initial encounter 07/27/2021   Tibialis posterior tendinitis, right 06/15/2020   Piriformis syndrome of left side 58/09/9831   Umbilical hernia 82/50/5397   Lumbar radiculopathy 07/09/2019   Contusion of right hip and thigh 04/20/2019   Loss of transverse plantar arch 02/11/2019   Sprain of iliolumbar ligament 10/29/2018   Piriformis syndrome of right side 09/30/2018   Nonallopathic lesion of sacral region 09/30/2018   Nonallopathic lesion of lumbosacral region 09/30/2018   Nonallopathic lesion of thoracic region 09/30/2018   Peroneal tendinitis of left lower extremity 02/11/2018   Polyarthralgia 02/11/2018   Patellofemoral syndrome of right knee 02/11/2018   Gastroesophageal reflux disease 01/16/2017   Cough variant asthma 01/16/2017   Noncompliance with medication treatment due to intermittent use of medication 01/16/2017   Allergic  rhinoconjunctivitis 11/08/2015   Sinobronchitis 10/03/2015   OSA (obstructive sleep apnea) 12/14/2014   Crohn's disease of colon ([place]) 05/24/2014   Depression 02/01/2014   Disorder of intestine 12/17/2010   Anorectal polyp 12/17/2010   Glaucoma 01/14/2010   Dry skin dermatitis 01/15/2008   Crohn's disease ([place]) 01/14/2005    Medication List:  Allergies as of 12/27/2021       Reactions   Chlorhexidine Gluconate Itching   Gabapentin Itching   Hydrocodone Itching   Oxycodone Hcl Itching   NDC QBHA:[id number]  [place] DZHG:[id number]        Medication List        Accurate as of December 27, 2021 11:59 PM. If you have any questions, ask your nurse or doctor.          acetaminophen 500 MG tablet Commonly known as: TYLENOL Take 500-1,000 mg by mouth every 6 (six) hours as needed (pain).   albuterol 108 (90 Base) MCG/ACT inhaler Commonly known as: VENTOLIN HFA Inhale 2 puffs into the lungs every 6 (six) hours as needed for wheezing or shortness of breath.   ascorbic acid 500 MG tablet Commonly known as: VITAMIN C Take 500 mg by mouth 2 (two) times a week.   busPIRone 7.5 MG tablet Commonly known as: BUSPAR Take 1 tablet (7.5 mg total) by mouth 2 (two) times daily.   celecoxib 100 MG capsule Commonly known as: CELEBREX Take 1 capsule (100 mg total) by mouth in the morning and at bedtime. What changed: when to take this   cetirizine 10 MG tablet Commonly known as: ZYRTEC Take 10 mg by mouth in the morning.   DULoxetine 60 MG capsule Commonly known as: Cymbalta Take 1 capsule (60 mg total) by mouth daily.   estradiol 0.05 MG/24HR patch Commonly known as: VIVELLE-DOT Apply 1 patch twice a week by transdermal route What changed:  how much to take when to take this additional instructions   fluocinonide ointment 0.05 % Commonly known as: LIDEX Apply 1 application topically daily as needed (irritation).   fluticasone 50 MCG/ACT nasal spray Commonly known  as: FLONASE Place 2 sprays into both nostrils every evening. What changed: Another medication with the same name was added. Make sure you understand how and when to take each. Changed by: Valentina Shaggy, MD   fluticasone 50 MCG/ACT nasal spray Commonly known as: FLONASE Place 2 sprays into both nostrils daily. What changed: You were already taking a medication with the same name, and this prescription was added. Make sure you understand how and when to take each. Changed by: Valentina Shaggy, MD   Humira 40 MG/0.4ML Pskt Generic drug: Adalimumab Inject 0.4 mLs (40 mg total) under the  skin every 14 (fourteen) days.   Lumigan 0.01 % Soln Generic drug: bimatoprost Instill 1 drop into both eyes at bedtime   mupirocin ointment 2 % Commonly known as: BACTROBAN Apply 1 application topically 2 (two) times daily. What changed:  when to take this reasons to take this   omeprazole 40 MG capsule Commonly known as: PRILOSEC Take 1 capsule (40 mg total) by mouth daily.   omeprazole 40 MG capsule Commonly known as: PRILOSEC Take 1 capsule (40 mg total) by mouth daily.   progesterone 100 MG capsule Commonly known as: PROMETRIUM Take 1 capsule (100 mg total) by mouth daily. What changed: when to take this   Vitamin D (Ergocalciferol) 1.25 MG (50000 UNIT) Caps capsule Commonly known as: DRISDOL Take 1 capsule (50,000 Units total) by mouth every 7 (seven) days. What changed: when to take this        Birth History: non-contributory  Developmental History: non-contributory  Past Surgical History: Past Surgical History:  Procedure Laterality Date   ANAL FISSURE REPAIR  2004   CESAREAN SECTION     COLONOSCOPY     INSERTION OF MESH N/A 07/26/2020   Procedure: INSERTION OF MESH;  Surgeon: Ralene Ok, MD;  Location: [facility];  Service: General;  Laterality: N/A;   LIGAMENT REPAIR Right 11/23/2015   Procedure: right index radial collateral LIGAMENT REPAIR;  Surgeon:  Leanora Cover, MD;  Location: [facility];  Service: Orthopedics;  Laterality: Right;   UPPER ENDOSCOPY W/ ESOPHAGEAL MANOMETRY  10/2021   VENTRAL HERNIA REPAIR N/A 07/26/2020   Procedure: LAPAROSCOPIC VENTRAL HERNIA REPAIR WITH MESH;  Surgeon: Ralene Ok, MD;  Location: [facility];  Service: General;  Laterality: N/A;     Family History: Family History  Problem Relation Age of Onset   Alcohol abuse Mother    Alcohol abuse Sister    Angioedema Neg Hx    Allergic rhinitis Neg Hx    Asthma Neg Hx    Atopy Neg Hx    Eczema Neg Hx    Immunodeficiency Neg Hx    Urticaria Neg Hx    Colon cancer Neg Hx    Stomach cancer Neg Hx    Esophageal cancer Neg Hx    Colon polyps Neg Hx      Social History: Indiah lives at home with her family.  The house that is 61 years old.  There is hardwood in the main living areas and carpeting in bedroom.  She is casting and central cooling.  There are 2 dogs inside of the home.  There are dust mite covers on the bed, but not the pillows.  There is no tobacco exposure.  She is not exposed to fumes, chemicals, or dust.  She does not use a HEPA filter.  She does not live near an interstate or industrial area. Her husband works in Cardiology.  He is working on [employer].  She is retiring in the next couple of years.  They recently have her niece and her niece's 2 children move in with them since she is in the middle of a rather nasty divorce.  They moved in at Thanksgiving.  She is unsure how long they are going to stay, but they seem happy to provide some help to her niece.  Jackelyn Poling is a stay-at-home wife, but she does pursue her hobbies including painting.     Review of Systems  Constitutional: Negative.  Negative for fever, malaise/fatigue and weight loss.  HENT:  Positive for sore throat.  Negative for congestion, ear discharge and ear pain.   Eyes:  Negative for pain, discharge and redness.  Respiratory:  Negative for cough, sputum production,  shortness of breath and wheezing.   Cardiovascular: Negative.  Negative for chest pain and palpitations.  Gastrointestinal:  Positive for heartburn. Negative for abdominal pain, nausea and vomiting.  Skin: Negative.  Negative for itching and rash.  Neurological:  Negative for dizziness and headaches.  Endo/Heme/Allergies:  Negative for environmental allergies. Does not bruise/bleed easily.       Objective:   Blood pressure 108/74, pulse 67, temperature 98.3 F (36.8 C), temperature source Temporal, resp. rate 16, height 5' 2"  (1.575 m), weight 149 lb 6.4 oz (67.8 kg), last menstrual period 01/10/2014. Body mass index is 27.33 kg/m.     Physical Exam Vitals reviewed.  Constitutional:      Appearance: She is well-developed.     Comments: Very friendly and talkative.  HENT:     Head: Normocephalic and atraumatic.     Right Ear: Tympanic membrane, ear canal and external ear normal. No drainage, swelling or tenderness. Tympanic membrane is not injected, scarred, erythematous, retracted or bulging.     Left Ear: Tympanic membrane, ear canal and external ear normal. No drainage, swelling or tenderness. Tympanic membrane is not injected, scarred, erythematous, retracted or bulging.     Nose: No nasal deformity, septal deviation, mucosal edema or rhinorrhea.     Right Turbinates: Enlarged, swollen and pale.     Left Turbinates: Enlarged, swollen and pale.     Right Sinus: No maxillary sinus tenderness or frontal sinus tenderness.     Left Sinus: No maxillary sinus tenderness or frontal sinus tenderness.     Comments: No nasal polyps.    Mouth/Throat:     Lips: Pink.     Mouth: Mucous membranes are moist. Mucous membranes are not pale and not dry.     Pharynx: Uvula midline.     Comments: Minimal oropharyngeal erythema. Eyes:     General:        Right eye: No discharge.        Left eye: No discharge.     Conjunctiva/sclera: Conjunctivae normal.     Right eye: Right conjunctiva is  not injected. No chemosis.    Left eye: Left conjunctiva is not injected. No chemosis.    Pupils: Pupils are equal, round, and reactive to light.  Cardiovascular:     Rate and Rhythm: Normal rate and regular rhythm.     Heart sounds: Normal heart sounds.  Pulmonary:     Effort: Pulmonary effort is normal. No tachypnea, accessory muscle usage or respiratory distress.     Breath sounds: Normal breath sounds. No wheezing, rhonchi or rales.  Chest:     Chest wall: No tenderness.  Abdominal:     Tenderness: There is no abdominal tenderness. There is no guarding or rebound.  Lymphadenopathy:     Head:     Right side of head: No submandibular, tonsillar or occipital adenopathy.     Left side of head: No submandibular, tonsillar or occipital adenopathy.     Cervical: No cervical adenopathy.  Skin:    Coloration: Skin is not pale.     Findings: No abrasion, erythema, petechiae or rash. Rash is not papular, urticarial or vesicular.  Neurological:     Mental Status: She is alert.  Psychiatric:        Behavior: Behavior is cooperative.      Diagnostic studies:  Allergy Studies:     Airborne Adult Perc - 12/27/21 1533     Time Antigen Placed 1533    Allergen Manufacturer Lavella Hammock    Location Back    Number of Test 59    Panel 1 Select    1. Control-Buffer 50% Glycerol Negative    2. Control-Histamine 1 mg/ml 2+    3. Albumin saline Negative    4. [place] Negative    5. [place] Negative    6. Johnson Negative    7. [place] Blue Negative    8. Meadow Fescue Negative    9. Perennial Rye Negative    10. Sweet Vernal Negative    11. Timothy Negative    12. Cocklebur Negative    13. Burweed Marshelder Negative    14. Ragweed, short Negative    15. Ragweed, Giant Negative    16. Plantain,  English Negative    17. Lamb's Quarters Negative    18. Sheep Sorrell Negative    19. Rough Pigweed Negative    20. Marsh Elder, Rough Negative    21. Mugwort, Common Negative    22. Ash mix  Negative    23. Birch mix Negative    24. Beech American Negative    25. Box, Elder Negative    26. Cedar, red Negative    27. Cottonwood, [place] Negative    28. Elm mix Negative    29. Hickory Negative    30. Maple mix Negative    31. Oak, [place] mix 2+    32. Pecan Pollen 2+    33. Pine mix Negative    34. Sycamore Eastern Negative    35. [place], Black Pollen Negative    36. Alternaria alternata Negative    37. Cladosporium Herbarum Negative    38. Aspergillus mix Negative    39. Penicillium mix Negative    40. Bipolaris sorokiniana (Helminthosporium) Negative    41. Drechslera spicifera (Curvularia) Negative    42. Mucor plumbeus Negative    43. Fusarium moniliforme Negative    44. Aureobasidium pullulans (pullulara) Negative    45. Rhizopus oryzae Negative    46. Botrytis cinera 2+    47. Epicoccum nigrum Negative    48. Phoma betae Negative    49. Candida Albicans Negative    50. Trichophyton mentagrophytes Negative    51. Mite, D Farinae  5,000 AU/ml 3+    52. Mite, D Pteronyssinus  5,000 AU/ml 3+    53. Cat Hair 10,000 BAU/ml Negative    54.  Dog Epithelia Negative    55. Mixed Feathers Negative    56. Horse Epithelia Negative    57. Cockroach, German Negative    58. Mouse Negative    59. Tobacco Leaf Negative             Food Adult Perc - 12/27/21 1500     Time Antigen Placed 1534    Allergen Manufacturer Lavella Hammock    Location Back    Number of allergen test 72     Control-buffer 50% Glycerol Negative    Control-Histamine 1 mg/ml 2+    1. Peanut Negative    2. Soybean Negative    3. Wheat Negative    4. Sesame Negative    5. Milk, cow Negative    6. Egg White, Chicken Negative    7. Casein Negative    8. Shellfish Mix Negative    9. Fish Mix Negative    10. Cashew Negative    11.  Pecan Food Negative    12. Flagler Negative    13. Almond Negative    14. Hazelnut Negative    15. [place] nut Negative    16. Coconut Negative    17. Pistachio  Negative    18. Catfish Negative    19. Bass Negative    20. Trout Negative    21. Tuna Negative    22. Salmon Negative    23. Flounder Negative    24. Codfish Negative    25. Shrimp Negative    26. Crab Negative    27. Lobster Negative    28. Oyster Negative    29. Scallops Negative    30. Barley Negative    31. Oat  Negative    32. Rye  Negative    33. Hops Negative    34. Rice Negative    35. Cottonseed Negative    36. Saccharomyces Cerevisiae  Negative    37. Pork Negative    38. [place] Meat Negative    39. Chicken Meat Negative    40. Beef Negative    41. Lamb Negative    42. Tomato Negative    43. White Potato Negative    44. Sweet Potato Negative    45. Pea, Green/English Negative    46. Navy Bean Negative    47. Mushrooms Negative    48. Avocado Negative    49. Onion Negative    50. Cabbage Negative    51. Carrots Negative    52. Celery Negative    53. Corn Negative    54. Cucumber Negative    55. Grape (White seedless) Negative    56. Orange  Negative    57. Banana Negative    58. Apple Negative    59. Peach Negative    60. Strawberry Negative    61. Cantaloupe Negative    62. Watermelon Negative    63. Pineapple Negative    64. Chocolate/Cacao bean Negative    65. Karaya Gum Negative    66. Acacia (Arabic Gum) Negative    67. Cinnamon Negative    68. Nutmeg Negative    69. Ginger Negative    70. Garlic Negative    71. Pepper, black Negative    72. Mustard Negative             Allergy testing results were read and interpreted by myself, documented by clinical staff.         Salvatore Marvel, MD [facility]

## 2021-12-28 ENCOUNTER — Encounter: Payer: Self-pay | Admitting: Allergy & Immunology

## 2021-12-28 ENCOUNTER — Other Ambulatory Visit (HOSPITAL_COMMUNITY): Payer: Self-pay

## 2021-12-28 ENCOUNTER — Other Ambulatory Visit: Payer: Self-pay

## 2021-12-31 ENCOUNTER — Other Ambulatory Visit (HOSPITAL_COMMUNITY): Payer: Self-pay

## 2021-12-31 DIAGNOSIS — R928 Other abnormal and inconclusive findings on diagnostic imaging of breast: Secondary | ICD-10-CM | POA: Diagnosis not present

## 2022-01-01 ENCOUNTER — Encounter (HOSPITAL_COMMUNITY): Payer: Self-pay | Admitting: Surgery

## 2022-01-01 ENCOUNTER — Ambulatory Visit: Admit: 2022-01-01 | Payer: 59 | Admitting: Surgery

## 2022-01-01 ENCOUNTER — Ambulatory Visit (HOSPITAL_COMMUNITY): Payer: 59 | Admitting: Vascular Surgery

## 2022-01-01 ENCOUNTER — Ambulatory Visit (HOSPITAL_BASED_OUTPATIENT_CLINIC_OR_DEPARTMENT_OTHER): Payer: 59 | Admitting: Certified Registered Nurse Anesthetist

## 2022-01-01 ENCOUNTER — Observation Stay (HOSPITAL_COMMUNITY)
Admission: RE | Admit: 2022-01-01 | Discharge: 2022-01-02 | Disposition: A | Discharge status: Home / Self Care | Payer: 59 | Attending: Surgery | Admitting: Surgery

## 2022-01-01 ENCOUNTER — Encounter (HOSPITAL_COMMUNITY)
Admission: RE | Disposition: A | Discharge status: Home / Self Care | Payer: Self-pay | Source: Home / Self Care | Attending: Surgery

## 2022-01-01 ENCOUNTER — Other Ambulatory Visit: Payer: Self-pay

## 2022-01-01 DIAGNOSIS — Z79899 Other long term (current) drug therapy: Secondary | ICD-10-CM | POA: Insufficient documentation

## 2022-01-01 DIAGNOSIS — G4733 Obstructive sleep apnea (adult) (pediatric): Secondary | ICD-10-CM

## 2022-01-01 DIAGNOSIS — C73 Malignant neoplasm of thyroid gland: Secondary | ICD-10-CM | POA: Diagnosis not present

## 2022-01-01 DIAGNOSIS — N6081 Other benign mammary dysplasias of right breast: Secondary | ICD-10-CM | POA: Insufficient documentation

## 2022-01-01 DIAGNOSIS — Z170 Estrogen receptor positive status [ER+]: Secondary | ICD-10-CM | POA: Diagnosis not present

## 2022-01-01 DIAGNOSIS — Z9989 Dependence on other enabling machines and devices: Secondary | ICD-10-CM | POA: Diagnosis not present

## 2022-01-01 DIAGNOSIS — Z7952 Long term (current) use of systemic steroids: Secondary | ICD-10-CM | POA: Diagnosis not present

## 2022-01-01 DIAGNOSIS — J45909 Unspecified asthma, uncomplicated: Secondary | ICD-10-CM

## 2022-01-01 DIAGNOSIS — N6012 Diffuse cystic mastopathy of left breast: Secondary | ICD-10-CM | POA: Diagnosis not present

## 2022-01-01 DIAGNOSIS — C7981 Secondary malignant neoplasm of breast: Secondary | ICD-10-CM | POA: Diagnosis not present

## 2022-01-01 DIAGNOSIS — N6092 Unspecified benign mammary dysplasia of left breast: Secondary | ICD-10-CM

## 2022-01-01 DIAGNOSIS — E042 Nontoxic multinodular goiter: Secondary | ICD-10-CM | POA: Diagnosis not present

## 2022-01-01 DIAGNOSIS — R920 Mammographic microcalcification found on diagnostic imaging of breast: Secondary | ICD-10-CM | POA: Diagnosis not present

## 2022-01-01 DIAGNOSIS — C50912 Malignant neoplasm of unspecified site of left female breast: Secondary | ICD-10-CM | POA: Diagnosis not present

## 2022-01-01 DIAGNOSIS — R928 Other abnormal and inconclusive findings on diagnostic imaging of breast: Secondary | ICD-10-CM | POA: Diagnosis not present

## 2022-01-01 DIAGNOSIS — E040 Nontoxic diffuse goiter: Secondary | ICD-10-CM | POA: Diagnosis not present

## 2022-01-01 HISTORY — PX: BREAST LUMPECTOMY WITH RADIOACTIVE SEED LOCALIZATION: SHX6424

## 2022-01-01 HISTORY — PX: THYROIDECTOMY: SHX17

## 2022-01-01 SURGERY — BREAST LUMPECTOMY WITH RADIOACTIVE SEED LOCALIZATION
Anesthesia: General | Site: Breast

## 2022-01-01 MED ORDER — PHENYLEPHRINE HCL-NACL 20-0.9 MG/250ML-% IV SOLN
INTRAVENOUS | Status: DC | PRN
  Administered 2022-01-01: 20 ug/min via INTRAVENOUS

## 2022-01-01 MED ORDER — MIDAZOLAM HCL 2 MG/2ML IJ SOLN
INTRAMUSCULAR | Status: AC
  Filled 2022-01-01: qty 2

## 2022-01-01 MED ORDER — OXYCODONE HCL 5 MG PO TABS: 5.0000 mg | ORAL_TABLET | ORAL | Status: DC | PRN

## 2022-01-01 MED ORDER — HYDROMORPHONE HCL 1 MG/ML IJ SOLN
0.2500 mg | INTRAMUSCULAR | Status: DC | PRN
  Administered 2022-01-01: 0.5 mg via INTRAVENOUS

## 2022-01-01 MED ORDER — MIDAZOLAM HCL 2 MG/2ML IJ SOLN
INTRAMUSCULAR | Status: DC | PRN
  Administered 2022-01-01: 2 mg via INTRAVENOUS

## 2022-01-01 MED ORDER — DULOXETINE HCL 60 MG PO CPEP: 60.0000 mg | ORAL_CAPSULE | Freq: Every day | ORAL | Status: DC

## 2022-01-01 MED ORDER — FENTANYL CITRATE (PF) 250 MCG/5ML IJ SOLN
INTRAMUSCULAR | Status: AC
  Filled 2022-01-01: qty 5

## 2022-01-01 MED ORDER — AMISULPRIDE (ANTIEMETIC) 5 MG/2ML IV SOLN: 10.0000 mg | Freq: Once | INTRAVENOUS | Status: DC | PRN

## 2022-01-01 MED ORDER — HYDROMORPHONE HCL 1 MG/ML IJ SOLN
INTRAMUSCULAR | Status: DC | PRN
  Administered 2022-01-01: .5 mg via INTRAVENOUS

## 2022-01-01 MED ORDER — FENTANYL CITRATE (PF) 250 MCG/5ML IJ SOLN
INTRAMUSCULAR | Status: DC | PRN
  Administered 2022-01-01: 150 ug via INTRAVENOUS
  Administered 2022-01-01 (×2): 50 ug via INTRAVENOUS

## 2022-01-01 MED ORDER — LIDOCAINE 2% (20 MG/ML) 5 ML SYRINGE
INTRAMUSCULAR | Status: DC | PRN
  Administered 2022-01-01: 60 mg via INTRAVENOUS

## 2022-01-01 MED ORDER — ONDANSETRON 4 MG PO TBDP: 4.0000 mg | ORAL_TABLET | Freq: Four times a day (QID) | ORAL | Status: DC | PRN

## 2022-01-01 MED ORDER — BUPIVACAINE-EPINEPHRINE 0.25% -1:200000 IJ SOLN
INTRAMUSCULAR | Status: DC | PRN
  Administered 2022-01-01: 20 mL

## 2022-01-01 MED ORDER — ACETAMINOPHEN 650 MG RE SUPP: 650.0000 mg | Freq: Four times a day (QID) | RECTAL | Status: DC | PRN

## 2022-01-01 MED ORDER — ACETAMINOPHEN 500 MG PO TABS
1000.0000 mg | ORAL_TABLET | ORAL | Status: AC
  Administered 2022-01-01: 1000 mg via ORAL
  Filled 2022-01-01: qty 2

## 2022-01-01 MED ORDER — ACETAMINOPHEN 325 MG PO TABS
650.0000 mg | ORAL_TABLET | Freq: Four times a day (QID) | ORAL | Status: DC | PRN
  Administered 2022-01-01: 650 mg via ORAL
  Filled 2022-01-01: qty 2

## 2022-01-01 MED ORDER — ORAL CARE MOUTH RINSE: 15.0000 mL | Freq: Once | OROMUCOSAL | Status: AC

## 2022-01-01 MED ORDER — HYDROMORPHONE HCL 1 MG/ML IJ SOLN: 1.0000 mg | INTRAMUSCULAR | Status: DC | PRN

## 2022-01-01 MED ORDER — EPHEDRINE 5 MG/ML INJ
INTRAVENOUS | Status: AC
  Filled 2022-01-01: qty 10

## 2022-01-01 MED ORDER — HYDROMORPHONE HCL 1 MG/ML IJ SOLN
INTRAMUSCULAR | Status: AC
  Filled 2022-01-01: qty 0.5

## 2022-01-01 MED ORDER — CEFAZOLIN SODIUM-DEXTROSE 2-4 GM/100ML-% IV SOLN
2.0000 g | INTRAVENOUS | Status: AC
  Administered 2022-01-01: 2 g via INTRAVENOUS
  Filled 2022-01-01: qty 100

## 2022-01-01 MED ORDER — SODIUM CHLORIDE 0.45 % IV SOLN: INTRAVENOUS | Status: DC

## 2022-01-01 MED ORDER — ALBUTEROL SULFATE (2.5 MG/3ML) 0.083% IN NEBU: 3.0000 mL | INHALATION_SOLUTION | Freq: Four times a day (QID) | RESPIRATORY_TRACT | Status: DC | PRN

## 2022-01-01 MED ORDER — ROCURONIUM BROMIDE 10 MG/ML (PF) SYRINGE
PREFILLED_SYRINGE | INTRAVENOUS | Status: DC | PRN
  Administered 2022-01-01: 30 mg via INTRAVENOUS
  Administered 2022-01-01: 50 mg via INTRAVENOUS
  Administered 2022-01-01: 70 mg via INTRAVENOUS

## 2022-01-01 MED ORDER — PROPOFOL 10 MG/ML IV BOLUS
INTRAVENOUS | Status: DC | PRN
  Administered 2022-01-01: 120 mg via INTRAVENOUS
  Administered 2022-01-01: 30 mg via INTRAVENOUS

## 2022-01-01 MED ORDER — SUGAMMADEX SODIUM 200 MG/2ML IV SOLN
INTRAVENOUS | Status: DC | PRN
  Administered 2022-01-01: 300 mg via INTRAVENOUS

## 2022-01-01 MED ORDER — BUPIVACAINE HCL (PF) 0.25 % IJ SOLN
INTRAMUSCULAR | Status: AC
  Filled 2022-01-01: qty 30

## 2022-01-01 MED ORDER — CEFAZOLIN SODIUM-DEXTROSE 2-4 GM/100ML-% IV SOLN: 2.0000 g | INTRAVENOUS | Status: DC

## 2022-01-01 MED ORDER — LATANOPROST 0.005 % OP SOLN
1.0000 [drp] | Freq: Every day | OPHTHALMIC | Status: DC
  Administered 2022-01-01: 1 [drp] via OPHTHALMIC
  Filled 2022-01-01: qty 2.5

## 2022-01-01 MED ORDER — CHLORHEXIDINE GLUCONATE 0.12 % MT SOLN: 15.0000 mL | Freq: Once | OROMUCOSAL | Status: DC

## 2022-01-01 MED ORDER — ONDANSETRON HCL 4 MG/2ML IJ SOLN
INTRAMUSCULAR | Status: DC | PRN
  Administered 2022-01-01: 4 mg via INTRAVENOUS

## 2022-01-01 MED ORDER — CHLORHEXIDINE GLUCONATE 0.12 % MT SOLN
OROMUCOSAL | Status: AC
  Administered 2022-01-01: 15 mL via OROMUCOSAL
  Filled 2022-01-01: qty 15

## 2022-01-01 MED ORDER — LACTATED RINGERS IV SOLN: INTRAVENOUS | Status: DC

## 2022-01-01 MED ORDER — ONDANSETRON HCL 4 MG/2ML IJ SOLN: 4.0000 mg | Freq: Once | INTRAMUSCULAR | Status: DC | PRN

## 2022-01-01 MED ORDER — CALCIUM CARBONATE 1250 (500 CA) MG PO TABS
2.0000 | ORAL_TABLET | Freq: Three times a day (TID) | ORAL | Status: DC
  Administered 2022-01-01 – 2022-01-02 (×2): 2500 mg via ORAL
  Filled 2022-01-01 (×2): qty 2

## 2022-01-01 MED ORDER — PANTOPRAZOLE SODIUM 40 MG PO TBEC: 40.0000 mg | DELAYED_RELEASE_TABLET | Freq: Every day | ORAL | Status: DC

## 2022-01-01 MED ORDER — HYDROMORPHONE HCL 1 MG/ML IJ SOLN
INTRAMUSCULAR | Status: AC
  Filled 2022-01-01: qty 1

## 2022-01-01 MED ORDER — TRAMADOL HCL 50 MG PO TABS: 50.0000 mg | ORAL_TABLET | Freq: Four times a day (QID) | ORAL | Status: DC | PRN

## 2022-01-01 MED ORDER — DEXAMETHASONE SODIUM PHOSPHATE 10 MG/ML IJ SOLN
INTRAMUSCULAR | Status: DC | PRN
  Administered 2022-01-01: 10 mg via INTRAVENOUS

## 2022-01-01 MED ORDER — ONDANSETRON HCL 4 MG/2ML IJ SOLN: 4.0000 mg | Freq: Four times a day (QID) | INTRAMUSCULAR | Status: DC | PRN

## 2022-01-01 MED ORDER — PROGESTERONE MICRONIZED 100 MG PO CAPS
100.0000 mg | ORAL_CAPSULE | Freq: Every evening | ORAL | Status: DC
  Administered 2022-01-01: 100 mg via ORAL
  Filled 2022-01-01: qty 1

## 2022-01-01 MED ORDER — BUSPIRONE HCL 15 MG PO TABS
7.5000 mg | ORAL_TABLET | Freq: Two times a day (BID) | ORAL | Status: DC
  Administered 2022-01-01: 7.5 mg via ORAL
  Filled 2022-01-01: qty 1

## 2022-01-01 MED ORDER — PROPOFOL 10 MG/ML IV BOLUS
INTRAVENOUS | Status: AC
  Filled 2022-01-01: qty 20

## 2022-01-01 SURGICAL SUPPLY — 73 items
ADH SKN CLS APL DERMABOND .7 (GAUZE/BANDAGES/DRESSINGS) ×1
APPLICATOR CHLORAPREP 10.5 ORG (MISCELLANEOUS) ×2 IMPLANT
APPLIER CLIP 9.375 MED OPEN (MISCELLANEOUS)
ATTRACTOMAT 16X20 MAGNETIC DRP (DRAPES) ×2 IMPLANT
BAG COUNTER SPONGE SURGICOUNT (BAG) ×4 IMPLANT
BINDER BREAST LRG (GAUZE/BANDAGES/DRESSINGS) IMPLANT
BINDER BREAST XLRG (GAUZE/BANDAGES/DRESSINGS) IMPLANT
BLADE CLIPPER SURG (BLADE) IMPLANT
BLADE SURG 10 STRL SS (BLADE) ×2 IMPLANT
BLADE SURG 15 STRL LF DISP TIS (BLADE) ×2 IMPLANT
BLADE SURG 15 STRL SS (BLADE) ×2
CANISTER SUCT 3000ML PPV (MISCELLANEOUS) ×2 IMPLANT
CHLORAPREP W/TINT 26 (MISCELLANEOUS) ×2 IMPLANT
CLIP APPLIE 9.375 MED OPEN (MISCELLANEOUS) IMPLANT
CLIP TI WIDE RED SMALL 24 (CLIP) IMPLANT
CLIP VESOCCLUDE MED 24/CT (CLIP) ×2 IMPLANT
CLIP VESOCCLUDE SM WIDE 24/CT (CLIP) ×2 IMPLANT
COVER PROBE W GEL 5X96 (DRAPES) ×2 IMPLANT
COVER SURGICAL LIGHT HANDLE (MISCELLANEOUS) ×4 IMPLANT
DERMABOND ADVANCED .7 DNX12 (GAUZE/BANDAGES/DRESSINGS) ×2 IMPLANT
DERMABOND ADVANCED .7 DNX6 (GAUZE/BANDAGES/DRESSINGS) IMPLANT
DEVICE DUBIN SPECIMEN MAMMOGRA (MISCELLANEOUS) ×2 IMPLANT
DRAPE CHEST BREAST 15X10 FENES (DRAPES) ×2 IMPLANT
DRAPE LAPAROTOMY 100X72 PEDS (DRAPES) ×2 IMPLANT
DRAPE UTILITY XL STRL (DRAPES) ×2 IMPLANT
ELECT CAUTERY BLADE 6.4 (BLADE) ×4 IMPLANT
ELECT REM PT RETURN 9FT ADLT (ELECTROSURGICAL) ×4
ELECTRODE REM PT RTRN 9FT ADLT (ELECTROSURGICAL) ×4 IMPLANT
GAUZE 4X4 16PLY ~~LOC~~+RFID DBL (SPONGE) ×2 IMPLANT
GAUZE SPONGE 4X4 12PLY STRL (GAUZE/BANDAGES/DRESSINGS) ×2 IMPLANT
GLOVE BIO SURGEON STRL SZ8 (GLOVE) ×2 IMPLANT
GLOVE BIOGEL PI IND STRL 6.5 (GLOVE) IMPLANT
GLOVE BIOGEL PI IND STRL 8 (GLOVE) ×2 IMPLANT
GLOVE SURG ORTHO 8.0 STRL STRW (GLOVE) ×2 IMPLANT
GOWN STRL REUS W/ TWL LRG LVL3 (GOWN DISPOSABLE) ×4 IMPLANT
GOWN STRL REUS W/ TWL XL LVL3 (GOWN DISPOSABLE) ×4 IMPLANT
GOWN STRL REUS W/TWL LRG LVL3 (GOWN DISPOSABLE) ×4
GOWN STRL REUS W/TWL XL LVL3 (GOWN DISPOSABLE) ×4
HEMOSTAT ARISTA ABSORB 3G PWDR (HEMOSTASIS) IMPLANT
HEMOSTAT SURGICEL 2X4 FIBR (HEMOSTASIS) ×2 IMPLANT
ILLUMINATOR WAVEGUIDE N/F (MISCELLANEOUS) IMPLANT
KIT BASIN OR (CUSTOM PROCEDURE TRAY) ×4 IMPLANT
KIT MARKER MARGIN INK (KITS) IMPLANT
KIT TURNOVER KIT B (KITS) ×2 IMPLANT
LIGHT WAVEGUIDE WIDE FLAT (MISCELLANEOUS) IMPLANT
NDL HYPO 25GX1X1/2 BEV (NEEDLE) IMPLANT
NEEDLE HYPO 25GX1X1/2 BEV (NEEDLE) IMPLANT
NS IRRIG 1000ML POUR BTL (IV SOLUTION) ×2 IMPLANT
PACK BASIC III (CUSTOM PROCEDURE TRAY) ×2
PACK GENERAL/GYN (CUSTOM PROCEDURE TRAY) ×2 IMPLANT
PACK SRG BSC III STRL LF ECLPS (CUSTOM PROCEDURE TRAY) ×2 IMPLANT
PAD ARMBOARD 7.5X6 YLW CONV (MISCELLANEOUS) ×2 IMPLANT
PENCIL BUTTON HOLSTER BLD 10FT (ELECTRODE) ×2 IMPLANT
POSITIONER HEAD DONUT 9IN (MISCELLANEOUS) ×2 IMPLANT
SHEARS HARMONIC 9CM CVD (BLADE) ×2 IMPLANT
SPECIMEN JAR MEDIUM (MISCELLANEOUS) ×2 IMPLANT
SPONGE INTESTINAL PEANUT (DISPOSABLE) ×2 IMPLANT
STRIP CLOSURE SKIN 1/2X4 (GAUZE/BANDAGES/DRESSINGS) ×2 IMPLANT
SUT MNCRL AB 4-0 PS2 18 (SUTURE) ×4 IMPLANT
SUT SILK 2 0 (SUTURE) ×2
SUT SILK 2 0 SH (SUTURE) IMPLANT
SUT SILK 2-0 18XBRD TIE 12 (SUTURE) ×2 IMPLANT
SUT VIC AB 2-0 SH 27 (SUTURE)
SUT VIC AB 2-0 SH 27XBRD (SUTURE) IMPLANT
SUT VIC AB 3-0 SH 18 (SUTURE) ×4 IMPLANT
SUT VIC AB 3-0 SH 27 (SUTURE)
SUT VIC AB 3-0 SH 27X BRD (SUTURE) IMPLANT
SUT VIC AB 3-0 SH 8-18 (SUTURE) ×2 IMPLANT
SYR BULB EAR ULCER 3OZ GRN STR (SYRINGE) ×2 IMPLANT
SYR CONTROL 10ML LL (SYRINGE) IMPLANT
TOWEL GREEN STERILE (TOWEL DISPOSABLE) ×2 IMPLANT
TOWEL GREEN STERILE FF (TOWEL DISPOSABLE) ×2 IMPLANT
TUBE CONNECTING 12X1/4 (SUCTIONS) ×2 IMPLANT

## 2022-01-01 NOTE — Op Note (Signed)
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  Core biopsy showed atypical lobular hyperplasia.  Discussed the pros and cons of excision in light of this.  Discussed with intraoperative skin operative management.  Given concern for discordance of the biopsy and mammographic findings, recommended left breast seed localized lumpectomy. The procedure has been discussed with the patient. Alternatives to surgery have been discussed with the patient.  Risks of surgery include bleeding,  Infection,  Seroma formation, death,  and the need for further surgery.   The patient understands and wishes to proceed.     Description of procedure: The patient was in the holding area questions were answered.  Left breast was marked as correct site.  She was taken back to the operating placed supine upon the OR table.  After induction of general anesthesia, prepped and draped in sterile fashion and timeout performed.  Of note , the seed placed in the left breast as outpatient.  Neoprobe used to identify the seed  in the left breast central location.  Local anesthetic infiltrated along the superior border of the nipple-areolar complex and a curvilinear incision was made.  Dissection was carried down into the deep tissues.  The seed and clip were identified with the probe and these were excised with grossly negative margins.  The Faxitron image revealed both the seed and clip to be in the specimen.  Irrigation used.  Hemostasis achieved with cautery.  Local anesthetic infiltrated throughout the lumpectomy Deep layers the cavity with closed with 3-0 Vicryl.  4 Monocryl was used to close the skin in subcuticular  fashion.  Dermabond applied.  All counts found to be correct.  At this portion of the case, Dr. Harlow Asa  scrubbed and performed his procedure.  No immediate complications.  All counts were correct.

## 2022-01-01 NOTE — Op Note (Signed)
Procedure Note  Pre-operative Diagnosis:  Papillary thyroid carcinoma  Post-operative Diagnosis:  same  Surgeon:  Armandina Gemma, MD  Assistant:  None   Procedure:  Total thyroidectomy with limited central compartment lymph node dissection (Zone VI)  Anesthesia:  General  Estimated Blood Loss:  minimal  Drains: none         Specimen: thyroid to pathology  Indications:  Patient is referred by Dr. June Leap for surgical evaluation and management of newly diagnosed papillary thyroid carcinoma. Patient's primary care physician is Dr. Tommie Ard [name]. Patient had undergone a cardiac CT scan as a screening test for cardiovascular disease. Incidental finding was made of a pulmonary nodule. Patient was referred to pulmonology where she underwent a PET scan. PET scan indicated the presence of a thyroid nodule. Patient underwent a thyroid ultrasound on November 08, 2021. This demonstrated a normal-sized thyroid gland with 2 nodules in the right thyroid lobe. The larger nodule measuring 2.2 cm underwent fine-needle aspiration biopsy on November 14, 2021. Findings were consistent with papillary thyroid carcinoma, Bethesda category VI. TSH level is normal at 3.08. Patient has had no prior history of thyroid disease. She has never been on thyroid medication. She has had no prior head or neck surgery. There is no family history of thyroid disease and no family history of thyroid cancer. Patient presents today for evaluation accompanied by her husband who is a cardiologist.   Procedure Details: Procedure was done in OR #2 at the [facility]. The patient had previously been brought to the operating room and placed in a supine position on the operating room table.  Patient had undergone a left breast excisional procedure by Dr. Erroll Luna.  At the conclusion of that procedure, I was invited to the operating room to proceed with thyroidectomy.  After ascertaining that an adequate level of anesthesia  had been maintained, a small Kocher incision was made with #15 blade. Dissection was carried through subcutaneous tissues and platysma.Hemostasis was achieved with the electrocautery. Skin flaps were elevated cephalad and caudad from the thyroid notch to the sternal notch. A self-retaining retractor was placed for exposure. Strap muscles were incised in the midline and dissection was begun on the left side.  Strap muscles were reflected laterally.  Left thyroid lobe was normal in size without palpable nodularity.  The left lobe was gently mobilized with blunt dissection. Superior pole vessels were dissected out and divided individually between small and medium ligaclips with the harmonic scalpel. The thyroid lobe was rolled anteriorly. Branches of the inferior thyroid artery were divided between small ligaclips with the harmonic scalpel. Inferior venous tributaries were divided between ligaclips. Both the superior and inferior parathyroid glands were identified and preserved on their vascular pedicles. The recurrent laryngeal nerve was identified and preserved along its course. The ligament of Gwenlyn Found was released with the electrocautery and the gland was mobilized onto the anterior trachea. Isthmus was mobilized across the midline. There was a small pyramidal lobe present which was resected with the isthmus. Dry pack was placed in the left neck.  Next we turned our attention to the right thyroid lobe.  Again, strep tonsils were elevated off the anterior surface of the right lobe and mobilized laterally.  Venous tributaries were divided between small ligaclips with the harmonic scalpel.  The right thyroid lobe was mildly enlarged.  There was an irregular, dense, fixed mass in the mid to superior portion of the lobe.  Superior pole was carefully dissected out and vascular structures  were divided between small ligaclips using the harmonic scalpel.  The surrounding muscular structures were dissected off of the capsule  of the thyroid where they were somewhat adherent.  Again these were divided with the harmonic scalpel.  The gland was rolled anteriorly.  Inferior venous tributaries were divided between small clips with the harmonic scalpel.  Inferior parathyroid gland was identified and preserved.  The thyroid was adherent posteriorly and medially to the trachea.  With some difficulty it was mobilized and the harmonic scalpel and electrocautery used judiciously to separate the thyroid tissue from the trachea.  It appeared that the tumor extended to the capsule of the thyroid lobe.  The gland was mobilized anteriorly and then resected off of the anterior trachea.  A suture was used to mark the right thyroid lobe.  Specimen was submitted to pathology.  Next the tissue anterior to the trachea and extending from the carotid on the right to the carotid on the left and inferiorly towards the innominate was carefully dissected out.  Care was taken to avoid the recurrent nerves and parathyroid tissue.  Tissue was completely excised with venous tributaries divided between ligaclips with the harmonic scalpel.  The tissue was submitted to pathology labeled as central compartment lymph nodes for permanent evaluation.  The neck was irrigated with warm saline. Fibrillar was placed throughout the operative field. Strap muscles were approximated in the midline with interrupted 3-0 Vicryl sutures. Platysma was closed with interrupted 3-0 Vicryl sutures. Skin was closed with a running 4-0 Monocryl subcuticular suture. Wound was washed and Dermabond was applied. The patient was awakened from anesthesia and brought to the recovery room. The patient tolerated the procedure well.   Armandina Gemma, [facility] Office: [phone]

## 2022-01-01 NOTE — Transfer of Care (Signed)
Immediate Anesthesia Transfer of Care Note  Patient: [name]  Procedure(s) Performed: LEFT BREAST SEED LUMPECTOMY (Left: Breast) TOTAL THYROIDECTOMY WITH LIMITED LYMPH NODE DISSECTION  Patient Location: PACU  Anesthesia Type:General  Level of Consciousness: awake, alert , and oriented  Airway & Oxygen Therapy: Patient Spontanous Breathing and Patient connected to nasal cannula oxygen  Post-op Assessment: Report given to RN and Post -op Vital signs reviewed and stable  Post vital signs: Reviewed and stable  Last Vitals:  Vitals Value Taken Time  BP 155/89 01/01/22 1600  Temp 36.4 C 01/01/22 1600  Pulse 76 01/01/22 1602  Resp 12 01/01/22 1602  SpO2 100 % 01/01/22 1602  Vitals shown include unvalidated device data.  Last Pain:  Vitals:   01/01/22 1135  TempSrc:   PainSc: 0-No pain         Complications: No notable events documented.

## 2022-01-01 NOTE — Interval H&P Note (Signed)
History and Physical Interval Note:  01/01/2022 12:44 PM  [name]  has presented today for surgery, with the diagnosis of ATYPICAL LOBULAR HYPERPLASIA AND PAPILLARY THYROID CARCINOMA.  The various methods of treatment have been discussed with the patient and family. After consideration of risks, benefits and other options for treatment, the patient has consented to    Procedure(s): LEFT BREAST SEED LUMPECTOMY (Left) TOTAL THYROIDECTOMY WITH LIMITED LYMPH NODE DISSECTION (N/A) as a surgical intervention.    The patient's history has been reviewed, patient examined, no change in status, stable for surgery.  I have reviewed the patient's chart and labs.  Questions were answered to the patient's satisfaction.    Armandina Gemma, [facility] [place] [facility] Office: [place]

## 2022-01-01 NOTE — Interval H&P Note (Signed)
History and Physical Interval Note:  01/01/2022 12:10 PM  [name]  has presented today for surgery, with the diagnosis of ATYPICAL LOBULAR HYPERPLASIA AND PAPILLARY THYROID CARCINOMA.  The various methods of treatment have been discussed with the patient and family. After consideration of risks, benefits and other options for treatment, the patient has consented to  Procedure(s): LEFT BREAST SEED LUMPECTOMY (Left) TOTAL THYROIDECTOMY WITH LIMITED LYMPH NODE DISSECTION (N/A) as a surgical intervention.  The patient's history has been reviewed, patient examined, no change in status, stable for surgery.  I have reviewed the patient's chart and labs.  Questions were answered to the patient's satisfaction.   The procedure has been discussed with the patient. Alternatives to surgery have been discussed with the patient.  Risks of surgery include bleeding,  Infection,  Seroma formation, death,  and the need for further surgery.   The patient understands and wishes to proceed.   [place]

## 2022-01-01 NOTE — H&P (Signed)
Patient presents for evaluation of abnormal mammogram. She was noted on recent mammogram to have an area of density left breast. Core biopsy showed fibrocystic change but a focal area of atypical lobular hyperplasia was discovered. She denies any history of breast pain nipple discharge or breast mass. Of note she needs to have a thyroidectomy for thyroid cancer.  Review of Systems: A complete review of systems was obtained from the patient. I have reviewed this information and discussed as appropriate with the patient. See HPI as well for other ROS.    Medical History: No past medical history on file.  Patient Active Problem List  Diagnosis  Papillary thyroid carcinoma (CMS-HCC)  Multiple thyroid nodules   No past surgical history on file.   Allergies  Allergen Reactions  Gabapentin Itching  Oxycodone Hcl Itching  NDC GGYI:[id number] NDC KKXF:[id number]  NDC CVEL:[id number] NDC ENID:[id number]  Chlorhexidine Gluconate Itching  Hydrocodone Itching   Current Outpatient Medications on File Prior to Visit  Medication Sig Dispense Refill  acetaminophen (TYLENOL) 500 MG tablet Take by mouth  albuterol 90 mcg/actuation inhaler Inhale into the lungs  ascorbic acid, vitamin C, (VITAMIN C) 500 MG tablet Take by mouth  busPIRone (BUSPAR) 7.5 MG tablet Take 1 tablet by mouth 2 (two) times daily  celecoxib (CELEBREX) 100 MG capsule TAKE 1 CAPSULE (100 MG TOTAL) BY MOUTH TWO (2) TIMES A DAY.  cetirizine (ZYRTEC) 10 MG tablet Take by mouth  DULoxetine (CYMBALTA) 60 MG DR capsule Take by mouth  estradiol (VIVELLE-DOT) patch 0.05 mg/24 hr Place onto the skin  famotidine (PEPCID) 20 MG tablet Take 20 mg by mouth  fluticasone propionate (FLONASE) 50 mcg/actuation nasal spray Place into one nostril  mupirocin (BACTROBAN) 2 % ointment Apply 1 Application topically 2 (two) times daily  omeprazole (PRILOSEC) 40 MG DR capsule Take by mouth  progesterone (PROMETRIUM) 100 MG capsule Take by mouth    No current facility-administered medications on file prior to visit.   No family history on file.   Social History   Tobacco Use  Smoking Status Not on file  Smokeless Tobacco Not on file    Social History   Socioeconomic History  Marital status: Married   Objective:   Vitals:  12/10/21 0914  BP: 128/80  Pulse: 79  Temp: 36.8 C (98.3 F)  SpO2: 99%  Weight: 67.6 kg (149 lb)  Height: 157.5 cm (5' 2" )   Body mass index is 27.25 kg/m.  Physical Exam HENT:  Head: Normocephalic.  Pulmonary:  Effort: Pulmonary effort is normal.  Chest:  Breasts: Right: Normal.  Left: Normal.  Musculoskeletal:  General: Normal range of motion.  Cervical back: Normal range of motion.  Lymphadenopathy:  Upper Body:  Right upper body: No supraclavicular or axillary adenopathy.  Left upper body: No supraclavicular or axillary adenopathy.  Skin: General: Skin is warm.  Neurological:  General: No focal deficit present.  Mental Status: She is alert.  Psychiatric:  Mood and Affect: Mood normal.  Behavior: Behavior normal.     Labs, Imaging and Diagnostic Testing: Vague densities left breast upper outer quadrant core biopsy proven to be fibrocystic changes with atypical lobular hyperplasia which is focal  Assessment and Plan:   Diagnoses and all orders for this visit:  Atypical lobular hyperplasia of left breast    Discussed findings with her and her husband today. Outlined the disease process and the pathophysiology behind atypical lobular hyperplasia. Discussed observation with MRI, risk reduction and lumpectomy. There is some discordance from the report  and pathology therefore recommend left breast seed lumpectomy. Risks and benefits as well as complications of surgery reviewed today with the patient and her husband. Risk of bleeding, infection, cosmesis, and the need for the treatments and/or procedures reviewed. They voiced understanding and agreed to proceed. Of note  this will be done with Dr. Harlow Asa when he does the thyroidectomy.  No follow-ups on file.  Kennieth Francois, MD

## 2022-01-01 NOTE — Anesthesia Procedure Notes (Signed)
Procedure Name: Intubation Date/Time: 01/01/2022 1:11 PM  Performed by: Valda Favia, CRNAPre-anesthesia Checklist: Patient identified, Emergency Drugs available, Suction available and Patient being monitored Patient Re-evaluated:Patient Re-evaluated prior to induction Oxygen Delivery Method: Circle System Utilized Preoxygenation: Pre-oxygenation with 100% oxygen Induction Type: IV induction Ventilation: Mask ventilation without difficulty Laryngoscope Size: Mac and 4 Grade View: Grade II Tube type: Oral Tube size: 7.0 mm Number of attempts: 1 Airway Equipment and Method: Stylet Placement Confirmation: ETT inserted through vocal cords under direct vision, positive ETCO2 and breath sounds checked- equal and bilateral Secured at: 21 cm Tube secured with: Tape Dental Injury: Teeth and Oropharynx as per pre-operative assessment

## 2022-01-01 NOTE — Anesthesia Postprocedure Evaluation (Signed)
Anesthesia Post Note  Patient: [name]  Procedure(s) Performed: LEFT BREAST SEED LUMPECTOMY (Left: Breast) TOTAL THYROIDECTOMY WITH LIMITED LYMPH NODE DISSECTION     Patient location during evaluation: PACU Anesthesia Type: General Level of consciousness: awake and alert Pain management: pain level controlled Vital Signs Assessment: post-procedure vital signs reviewed and stable Respiratory status: spontaneous breathing, nonlabored ventilation and respiratory function stable Cardiovascular status: stable and blood pressure returned to baseline Anesthetic complications: no   No notable events documented.  Last Vitals:  Vitals:   01/01/22 1615 01/01/22 1630  BP: (!) 182/91 (!) 153/79  Pulse: 73 67  Resp: (!) 21 20  Temp:    SpO2: 98% 97%    Last Pain:  Vitals:   01/01/22 1630  TempSrc:   PainSc: [name]

## 2022-01-02 ENCOUNTER — Other Ambulatory Visit (HOSPITAL_COMMUNITY): Payer: Self-pay

## 2022-01-02 ENCOUNTER — Other Ambulatory Visit: Payer: Self-pay

## 2022-01-02 ENCOUNTER — Encounter (HOSPITAL_COMMUNITY): Payer: Self-pay | Admitting: Surgery

## 2022-01-02 DIAGNOSIS — Z7952 Long term (current) use of systemic steroids: Secondary | ICD-10-CM | POA: Diagnosis not present

## 2022-01-02 DIAGNOSIS — N6081 Other benign mammary dysplasias of right breast: Secondary | ICD-10-CM | POA: Diagnosis not present

## 2022-01-02 DIAGNOSIS — C73 Malignant neoplasm of thyroid gland: Secondary | ICD-10-CM | POA: Diagnosis not present

## 2022-01-02 DIAGNOSIS — Z79899 Other long term (current) drug therapy: Secondary | ICD-10-CM | POA: Diagnosis not present

## 2022-01-02 LAB — BASIC METABOLIC PANEL
Anion gap: 9 (ref 5–15)
BUN: 6 mg/dL — ABNORMAL LOW (ref 8–23)
CO2: 22 mmol/L (ref 22–32)
Calcium: 8.8 mg/dL — ABNORMAL LOW (ref 8.9–10.3)
Chloride: 104 mmol/L (ref 98–111)
Creatinine, Ser: 0.59 mg/dL (ref 0.44–1.00)
GFR, Estimated: >60 mL/min (ref >60)
Glucose, Bld: 139 mg/dL — ABNORMAL HIGH (ref 70–99)
Potassium: 3.9 mmol/L (ref 3.5–5.1)
Sodium: 135 mmol/L (ref 135–145)

## 2022-01-02 MED ORDER — LEVOTHYROXINE SODIUM 100 MCG PO TABS
100.0000 ug | ORAL_TABLET | Freq: Every day | ORAL | 2 refills | Status: DC
  Filled 2022-01-02 (×2): qty 30, 30d supply, fill #0

## 2022-01-02 MED ORDER — CALCIUM CARBONATE ANTACID 500 MG PO CHEW
2.0000 | CHEWABLE_TABLET | Freq: Two times a day (BID) | ORAL | 1 refills | Status: DC
  Filled 2022-01-02: qty 90, 23d supply, fill #0

## 2022-01-02 NOTE — Discharge Summary (Signed)
Physician Discharge Summary   Patient ID: [name] MRN: [id number] DOB/AGE: 1960/07/27 61 y.o.  Admit date: 01/01/2022  Discharge date: 01/02/2022  Discharge Diagnoses:  Principal Problem:   Papillary thyroid carcinoma [facility]) Active Problems:   Multiple thyroid nodules   Discharged Condition: good  Hospital Course: Patient was admitted for observation following thyroid surgery and breast surgery.  Post op course was uncomplicated.  Pain was well controlled.  Tolerated diet.  Post op calcium level on morning following surgery was 8.8 mg/dl.  Patient was prepared for discharge home on POD#1.  Consults: None  Treatments: surgery: total thyroidectomy with limited lymph node dissection, left breast lumpectomy  Discharge Exam: Blood pressure 125/67, pulse 81, temperature 98.4 F (36.9 C), temperature source Oral, resp. rate 16, height 5' 2"  (1.575 m), weight 68 kg, last menstrual period 01/10/2014, SpO2 95 %. HEENT - clear Neck - wound dry and intact; voice normal; mild STS; Dermabond in place Breast - binder in place; dry and intact  Disposition: Home  Discharge Instructions     Diet - low sodium heart healthy   Complete by: As directed    Increase activity slowly   Complete by: As directed    No dressing needed   Complete by: As directed       Allergies as of 01/02/2022       Reactions   Chlorhexidine Gluconate Itching   Gabapentin Itching   Hydrocodone Itching   Oxycodone Hcl Itching   NDC MQKM:[id number]  NDC XUXY:[id number]        Medication List     TAKE these medications    acetaminophen 500 MG tablet Commonly known as: TYLENOL Take 500-1,000 mg by mouth every 6 (six) hours as needed (pain).   albuterol 108 (90 Base) MCG/ACT inhaler Commonly known as: VENTOLIN HFA Inhale 2 puffs into the lungs every 6 (six) hours as needed for wheezing or shortness of breath.   ascorbic acid 500 MG tablet Commonly known as: VITAMIN C Take 500 mg by  mouth 2 (two) times a week.   busPIRone 7.5 MG tablet Commonly known as: BUSPAR Take 1 tablet (7.5 mg total) by mouth 2 (two) times daily.   calcium carbonate 500 MG chewable tablet Commonly known as: Tums Chew 2 tablets (400 mg of elemental calcium total) by mouth 2 (two) times daily.   celecoxib 100 MG capsule Commonly known as: CELEBREX Take 1 capsule (100 mg total) by mouth in the morning and at bedtime. What changed: when to take this   cetirizine 10 MG tablet Commonly known as: ZYRTEC Take 10 mg by mouth in the morning.   DULoxetine 60 MG capsule Commonly known as: Cymbalta Take 1 capsule (60 mg total) by mouth daily.   estradiol 0.05 MG/24HR patch Commonly known as: VIVELLE-DOT Apply 1 patch twice a week by transdermal route What changed:  how much to take when to take this additional instructions   fluocinonide ointment 0.05 % Commonly known as: LIDEX Apply 1 application topically daily as needed (irritation).   fluticasone 50 MCG/ACT nasal spray Commonly known as: FLONASE Place 2 sprays into both nostrils every evening.   fluticasone 50 MCG/ACT nasal spray Commonly known as: FLONASE Place 2 sprays into both nostrils daily.   Humira 40 MG/0.4ML Pskt Generic drug: Adalimumab Inject 0.4 mLs (40 mg total) under the skin every 14 (fourteen) days.   levothyroxine 100 MCG tablet Commonly known as: Synthroid Take 1 tablet (100 mcg total) by mouth daily before breakfast.  Lumigan 0.01 % Soln Generic drug: bimatoprost Instill 1 drop into both eyes at bedtime   mupirocin ointment 2 % Commonly known as: BACTROBAN Apply 1 application topically 2 (two) times daily. What changed:  when to take this reasons to take this   omeprazole 40 MG capsule Commonly known as: PRILOSEC Take 1 capsule (40 mg total) by mouth daily.   progesterone 100 MG capsule Commonly known as: PROMETRIUM Take 1 capsule (100 mg total) by mouth daily. What changed: when to take  this   Vitamin D (Ergocalciferol) 1.25 MG (50000 UNIT) Caps capsule Commonly known as: DRISDOL Take 1 capsule (50,000 Units total) by mouth every 7 (seven) days. What changed: when to take this               Discharge Care Instructions  (From admission, onward)           Start     Ordered   01/02/22 0000  No dressing needed        01/02/22 5638            Follow-up Information     Armandina Gemma, MD. Schedule an appointment as soon as possible for a visit in 3 week(s).   Specialty: General Surgery Why: For wound re-check Contact information: [place] [phone]                 Kyreese Chio, [facility] Office: [phone]   Signed: Armandina Gemma 01/02/2022, 7:52 AM

## 2022-01-02 NOTE — Progress Notes (Signed)
Pt discharged home in stable condition 

## 2022-01-02 NOTE — Discharge Instructions (Signed)
[facility] - Dr. Armandina Gemma  THYROID & PARATHYROID SURGERY:  POST-OP INSTRUCTIONS  Always review the instruction sheet provided by the hospital nurse at discharge.  A prescription for pain medication may be sent to your pharmacy at the time of discharge.  Take your pain medication as prescribed.  If narcotic pain medicine is not needed, then you may take acetaminophen (Tylenol) or ibuprofen (Advil) as needed for pain or soreness.  Take your normal home medications as prescribed unless otherwise directed.  If you need a refill on your pain medication, please contact the office during regular business hours.  Prescriptions will not be processed by the office after 5:00PM or on weekends.  Start with a light diet upon arrival home, such as soup and crackers or toast.  Be sure to drink plenty of fluids.  Resume your normal diet the day after surgery.  Most patients will experience some swelling and bruising on the chest and neck area.  Ice packs will help for the first 48 hours after arriving home.  Swelling and bruising will take several days to resolve.   It is common to experience some constipation after surgery.  Increasing fluid intake and taking a stool softener (Colace) will usually help to prevent this problem.  A mild laxative (Milk of Magnesia or Miralax) should be taken according to package directions if there has been no bowel movement after 48 hours.  Dermabond glue covers your incision. This seals the wound and you may shower at any time. The Dermabond will remain in place for about a week.  You may gradually remove the glue when it loosens around the edges.  If you need to loosen the Dermabond for removal, apply a layer of Vaseline to the wound for 15 minutes and then remove with a Kleenex. Your sutures are under the skin and will not show - they will dissolve on their own.  You may resume light daily activities beginning the day after discharge (such as self-care,  walking, climbing stairs), gradually increasing activities as tolerated. You may have sexual intercourse when it is comfortable. Refrain from any heavy lifting or straining until approved by your doctor. You may drive when you no longer are taking prescription pain medication, you can comfortably wear a seatbelt, and you can safely maneuver your car and apply the brakes.  You will see your doctor in the office for a follow-up appointment approximately three weeks after your surgery.  Make sure that you call for this appointment within a day or two after you arrive home to insure a convenient appointment time. Please have any requested laboratory tests performed a few days prior to your office visit so that the results will be available at your follow up appointment.  WHEN TO CALL THE [facility] OFFICE: -- Fever greater than 101.5 -- Inability to urinate -- Nausea and/or vomiting - persistent -- Extreme swelling or bruising -- Continued bleeding from incision -- Increased pain, redness, or drainage from the incision -- Difficulty swallowing or breathing -- Muscle cramping or spasms -- Numbness or tingling in hands or around lips  The clinic staff is available to answer your questions during regular business hours.  Please don't hesitate to call and ask to speak to one of the nurses if you have concerns.  [facility] OFFICE: [phone] (24 hours)  Please sign up for MyChart accounts. This will allow you to communicate directly with my nurse or myself without having to call the office. It will also allow you  to view your test results. You will need to enroll in MyChart for my office ([facility]) and for the hospital [facility]).  Armandina Gemma, MD [facility] [place] [facility]

## 2022-01-04 ENCOUNTER — Encounter: Payer: Self-pay | Admitting: Internal Medicine

## 2022-01-04 ENCOUNTER — Ambulatory Visit (INDEPENDENT_AMBULATORY_CARE_PROVIDER_SITE_OTHER): Payer: 59 | Admitting: Internal Medicine

## 2022-01-04 VITALS — BP 122/78 | HR 70 | Ht 62.0 in | Wt 149.0 lb

## 2022-01-04 DIAGNOSIS — C73 Malignant neoplasm of thyroid gland: Secondary | ICD-10-CM | POA: Diagnosis not present

## 2022-01-04 DIAGNOSIS — E890 Postprocedural hypothyroidism: Secondary | ICD-10-CM | POA: Diagnosis not present

## 2022-01-04 NOTE — Progress Notes (Signed)
Patient ID: [name], female   DOB: 1960/06/14, 61 y.o.   MRN: [id number]  HPI  [name] is a 61 y.o.-year-old female, referred by Dr. Harlow Asa, for management of papillary thyroid cancer and postsurgical hypothyroidism.  Pt. has been dx with thyroid cancer recently.  She had a PET scan in 10/2021 during investigation of a pulmonary nodule.  This showed a hyperactive thyroid nodule.  Thyroid ultrasound showed a 2.2 cm right thyroid nodule that met criteria for biopsy.  There was also smaller right mid thyroid nodule, measuring 1.3 cm, that only requires follow-up, and a smaller left thyroid nodule, not worrisome.  She had biopsy of the dominant thyroid nodule, which came positive for papillary thyroid carcinoma.  She had total thyroidectomy by Dr. Harlow Asa on 01/01/2022 (61 days ago).  She feels well after the surgery.  Reviewed available investigation in detail: PET scan (10/29/2021): Hypermetabolic heterogeneous 19 mm right thyroid nodule, suggest further evaluation with thyroid ultrasound and FNA.  Thyroid U/S (11/08/2021): Parenchymal Echotexture: Mildly heterogenous  Isthmus: 0.2 cm  Right lobe: 4.2 x 1.7 x 2.0 cm  Left lobe: 3.8 x 1.3 x 1.2 cm _________________________________________________________   Nodule # 1:  Location: Right; Mid  Maximum size: 2.2 cm; Other 2 dimensions: 2.1 x 1.7 cm  Composition: solid/almost completely solid (2)  Echogenicity: hypoechoic (2)  Margins: lobulated/irregular (2)  Echogenic foci: macrocalcifications (1)  ACR TI-RADS total points: 7. **Given size (>/= 1.0 cm) and appearance, fine needle aspiration of this highly suspicious nodule should be considered based on TI-RADS criteria.    _________________________________________________________   Nodule # 2:  Location: Right; Mid  Maximum size: 1.3 cm; Other 2 dimensions: 1.3 x 1.0 cm  Composition: solid/almost completely solid (2)  Echogenicity: hypoechoic (2) ACR TI-RADS total  points: 4. *Given size (>/= 1 - 1.4 cm) and appearance, a follow-up ultrasound in 1 year should be considered based on TI-RADS criteria.  _________________________________________________________   Nodule # 3: Small mixed cystic and solid nodule in the left mid gland does not warrant further evaluation.   IMPRESSION: 1. Approximately 2.2 cm TI-RADS category 5 nodule in the right upper gland meets criteria to consider fine-needle aspiration biopsy. Biopsy is recommended. 2. A 1.3 cm TI-RADS category 4 nodule in the right mid gland meets criteria for imaging surveillance. Recommend follow-up ultrasound in 1 year.  FNA (11/14/2021):  FINAL MICROSCOPIC DIAGNOSIS:  - Findings consistent with papillary carcinoma (Bethesda category VI)  SPECIMEN ADEQUACY:  Satisfactory for evaluation   Total thyroidectomy (01/01/2022)-Dr. Gerkin -pathology pending.  She does have hoarseness and difficulty swallowing, which she attributes to her esophagitis.  Postsurgical hypothyroidism:  Pt is on levothyroxine 100 mcg daily, taken: - in am - fasting - at least 2h from b'fast - + calcium 2x a day - first dose in am - no iron - + occasionally multivitamins in am - + PPIs -Omeprazole- in am - + Biotin - in B complex - but not in few weeks  I reviewed pt's thyroid tests: Lab Results  Component Value Date   TSH 3.08 08/30/2021   TSH 2.65 03/02/2020   TSH 2.96 09/25/2018   TSH 1.37 02/11/2018   TSH 1.32 07/10/2017   TSH 2.95 05/05/2015   FREET4 1.1 05/05/2015    She has no FH of thyroid disorders. No FH of thyroid cancer.  No h/o radiation tx to head or neck.  She had a low calcium after surgery: Lab Results  Component Value Date   PTH  21 02/11/2018   CALCIUM 8.8 (L) 01/02/2022   CALCIUM 9.8 08/30/2021   CALCIUM 8.6 03/02/2020   CALCIUM 9.4 09/25/2018   CALCIUM 9.4 02/11/2018   CALCIUM 9.2 02/11/2018   CALCIUM 8.9 07/10/2017   CALCIUM 9.3 04/05/2010  She is on calcium 500 mg (Tums)  twice a day after the surgery.  Vitamin D levels were normal: Lab Results  Component Value Date   VD25OH 67 03/02/2020   VD25OH 60 09/25/2018   VD25OH 32 07/10/2017   She has a past medical history of Crohn's disease, eosinophilic esophagitis, GERD, OSA, HL, lung nodule - benign. Precancerous les. In breast >> had lumpectomy 01/01/2022, at the same time as her thyroidectomy.  ROS: Constitutional: no weight gain/loss, no fatigue, + hot flashes at night, despite HRT Eyes: no blurry vision, no xerophthalmia ENT: + sore throat, + see HPI, + tinnitus Cardiovascular: no CP/SOB/palpitations/leg swelling Respiratory: no cough/SOB Gastrointestinal: no N/V/D/C Musculoskeletal: no muscle/joint aches Skin: no rashes Neurological: no tremors/numbness/tingling/dizziness Psychological: + Anxiety and depression  Past Medical History:  Diagnosis Date   Anxiety    Allergy induced asthma   Arthritis    Asthma    Crohn disease ([place])    Depression    GERD (gastroesophageal reflux disease)    History of methicillin resistant staphylococcus aureus (MRSA)    pt denies this.   Multiple thyroid nodules    Sleep apnea    CPAP   Thyroid cancer [facility])    Past Surgical History:  Procedure Laterality Date   ANAL FISSURE REPAIR  2004   BREAST LUMPECTOMY WITH RADIOACTIVE SEED LOCALIZATION Left 01/01/2022   Procedure: LEFT BREAST SEED LUMPECTOMY;  Surgeon: Erroll Luna, MD;  Location: [place];  Service: General;  Laterality: Left;   CESAREAN SECTION     COLONOSCOPY     INSERTION OF MESH N/A 07/26/2020   Procedure: INSERTION OF MESH;  Surgeon: Ralene Ok, MD;  Location: [place];  Service: General;  Laterality: N/A;   LIGAMENT REPAIR Right 11/23/2015   Procedure: right index radial collateral LIGAMENT REPAIR;  Surgeon: Leanora Cover, MD;  Location: [place];  Service: Orthopedics;  Laterality: Right;   THYROIDECTOMY N/A 01/01/2022   Procedure: TOTAL THYROIDECTOMY WITH LIMITED LYMPH  NODE DISSECTION;  Surgeon: Armandina Gemma, MD;  Location: [place];  Service: General;  Laterality: N/A;   UPPER ENDOSCOPY W/ ESOPHAGEAL MANOMETRY  10/2021   VENTRAL HERNIA REPAIR N/A 07/26/2020   Procedure: LAPAROSCOPIC VENTRAL HERNIA REPAIR WITH MESH;  Surgeon: Ralene Ok, MD;  Location: [place];  Service: General;  Laterality: N/A;   Social History   Socioeconomic History   Marital status: Married    Spouse name: Not on file   Number of children: 2   Years of education: Not on file   Highest education level: Not on file  Occupational History   Occupation: Self employed  Tobacco Use   Smoking status: Never    Passive exposure: Never   Smokeless tobacco: Never  Vaping Use   Vaping Use: Never used  Substance and Sexual Activity   Alcohol use: Yes    Alcohol/week: 10.0 standard drinks of alcohol    Types: 10 Glasses of wine per week    Comment: 10 glasses/week   Drug use: No   Sexual activity: Yes    Birth control/protection: Pill  Other Topics Concern   Not on file  Social History Narrative   Not on file   Social Determinants of Health   Financial Resource Strain:  Not on file  Food Insecurity: Not on file  Transportation Needs: Not on file  Physical Activity: Not on file  Stress: Not on file  Social Connections: Not on file  Intimate Partner Violence: Not on file   Current Outpatient Medications on File Prior to Visit  Medication Sig Dispense Refill   acetaminophen (TYLENOL) 500 MG tablet Take 500-1,000 mg by mouth every 6 (six) hours as needed (pain).     Adalimumab (HUMIRA) 40 MG/0.4ML PSKT Inject 0.4 mLs (40 mg total) under the skin every 14 (fourteen) days. 2 each 5   albuterol (VENTOLIN HFA) 108 (90 Base) MCG/ACT inhaler Inhale 2 puffs into the lungs every 6 (six) hours as needed for wheezing or shortness of breath. 8.5 g 3   ascorbic acid (VITAMIN C) 500 MG tablet Take 500 mg by mouth 2 (two) times a week.     bimatoprost (LUMIGAN) 0.01 % SOLN Instill 1 drop  into both eyes at bedtime 2.5 mL 10   busPIRone (BUSPAR) 7.5 MG tablet Take 1 tablet (7.5 mg total) by mouth 2 (two) times daily. 180 tablet 2   calcium carbonate (TUMS) 500 MG chewable tablet Chew 2 tablets (400 mg of elemental calcium total) by mouth 2 (two) times daily. 90 tablet 1   celecoxib (CELEBREX) 100 MG capsule Take 1 capsule (100 mg total) by mouth in the morning and at bedtime. (Patient taking differently: Take 100 mg by mouth in the morning.) 60 capsule 11   cetirizine (ZYRTEC) 10 MG tablet Take 10 mg by mouth in the morning.     DULoxetine (CYMBALTA) 60 MG capsule Take 1 capsule (60 mg total) by mouth daily. 90 capsule 1   estradiol (VIVELLE-DOT) 0.05 MG/24HR patch Apply 1 patch twice a week by transdermal route (Patient taking differently: Place 1 patch onto the skin 2 (two) times a week. On Sundays & Wednesdays) 26 patch 3   fluocinonide ointment (LIDEX) 8.29 % Apply 1 application topically daily as needed (irritation).     fluticasone (FLONASE) 50 MCG/ACT nasal spray Place 2 sprays into both nostrils every evening. 16 g 2   fluticasone (FLONASE) 50 MCG/ACT nasal spray Place 2 sprays into both nostrils daily. 16 g 1   levothyroxine (SYNTHROID) 100 MCG tablet Take 1 tablet (100 mcg total) by mouth daily before breakfast. 30 tablet 2   mupirocin ointment (BACTROBAN) 2 % Apply 1 application topically 2 (two) times daily. (Patient taking differently: Apply 1 application  topically 2 (two) times daily as needed (irritation.).) 22 g 3   omeprazole (PRILOSEC) 40 MG capsule Take 1 capsule (40 mg total) by mouth daily. 90 capsule 3   progesterone (PROMETRIUM) 100 MG capsule Take 1 capsule (100 mg total) by mouth daily. (Patient taking differently: Take 100 mg by mouth every evening.) 90 capsule 3   Vitamin D, Ergocalciferol, (DRISDOL) 1.25 MG (50000 UNIT) CAPS capsule Take 1 capsule (50,000 Units total) by mouth every 7 (seven) days. (Patient taking differently: Take 50,000 Units by mouth every  Thursday.) 12 capsule 0   Current Facility-Administered Medications on File Prior to Visit  Medication Dose Route Frequency Provider Last Rate Last Admin   0.9 %  sodium chloride infusion  500 mL Intravenous Continuous Armbruster, Carlota Raspberry, MD       Allergies  Allergen Reactions   Chlorhexidine Gluconate Itching   Gabapentin Itching   Hydrocodone Itching   Oxycodone Hcl Itching    NDC FAOZ:[id number]  NDC EXBM:[id number]   Family History  Problem Relation  Age of Onset   Alcohol abuse Mother    Alcohol abuse Sister    Angioedema Neg Hx    Allergic rhinitis Neg Hx    Asthma Neg Hx    Atopy Neg Hx    Eczema Neg Hx    Immunodeficiency Neg Hx    Urticaria Neg Hx    Colon cancer Neg Hx    Stomach cancer Neg Hx    Esophageal cancer Neg Hx    Colon polyps Neg Hx   + HL in uncle, aunt  PE: BP 122/78 (BP Location: Right Arm, Patient Position: Sitting, Cuff Size: Normal)   Pulse 70   Ht _0  (1.575 m)   Wt 149 lb (67.6 kg)   LMP 01/10/2014   SpO2 97%   BMI 27.25 kg/m  Wt Readings from Last 3 Encounters:  01/04/22 149 lb (67.6 kg)  01/01/22 150 lb (68 kg)  12/27/21 149 lb 6.4 oz (67.8 kg)   Constitutional: normal weight, in NAD Eyes: PERRLA, EOMI, no exophthalmos ENT: no neck masses palpated, thyroidectomy scar healing, without erythema but with edema around the scar, no fluctuance, no pain on palpation, no cervical lymphadenopathy Cardiovascular: RRR, No MRG Respiratory: CTA B Musculoskeletal: no deformities Skin: no rashes Neurological: no tremor with outstretched hands  ASSESSMENT: 1. Thyroid cancer - see HPI  2. Postsurgical Hypothyroidism  3.  Postsurgical hypocalcemia  PLAN:  1.  Papillary thyroid cancer - I had a long discussion with the patient about her recent diagnosis of thyroid cancer.  Pathology is not available yet.  I discussed with Dr. Harlow Asa, and he felt that the tumor was invading the surrounding thyroid tissue. - I reassured her that  papillary thyroid cancer is usually a slow growing cancer with good prognosis; her life expectancy or quality of life are not likely to be reduced due to the cancer.  - since the tumor was >1.5 cm and appeared to be infiltrative, radioactive iodine is indicated for post-op thyroid remnant ablation. I explained that the main role of this is to facilitate monitoring in the long run (by checking thyroglobulin). I explained what this entails.  We also discussed about radiation safety precautions after the treatment.  We will do the RAI with Thyrogen stimulation, rather than hormonal withdrawal.  I explained that a whole-body scan will be done afterwards to check for any metastasis or local extension.  Pt agrees with these tests.  We need to allow her to heal first so we discussed about ordering approximately 2 months from the surgery. - At next visit, we will check a thyroglobulin and ATA antibodies.  These will serve as a baseline.  We discussed that the trend would be valuable as a sensitive marker to detect cancer recurrence. - Given reference website for more information about thyroid cancer and radioactive iodine treatment; also given handout about RAI treatment and radiation precautions. - I will then see the patient in approximately 3 months  2.  Patient with h/o total thyroidectomy for cancer, now with iatrogenic hypothyroidism, on levothyroxine therapy.  - latest thyroid labs reviewed with pt. >> normal before surgery: Lab Results  Component Value Date   TSH 3.08 08/30/2021  - she continues on LT4 100 mcg daily - pt feels good on this dose, but she only took 2 doses since her surgery was 3 days ago. - we discussed about taking the thyroid hormone every day, with water, >30 minutes before breakfast, separated by >4 hours from acid reflux medications, calcium, iron,  multivitamins. Pt. Is not taking it correctly.  She is taking it close to coffee with milk and close to omeprazole and calcium.  I  advised her to move omeprazole and calcium at least 4 hours later and, if she started multivitamin, to also take these at least 4 hours later. - will check thyroid tests in 1.5 mo: TSH and fT4 - OTW, I will see her back in 3 months  3.  Postsurgical hypocalcemia -She had a slightly low calcium after surgery -She is now on calcium 500 mg twice a day -I advised her to move the first dose with lunch, as she is not taking it with breakfast, close to levothyroxine.  Discussed that calcium carbonate is better absorbed with a meal.  She is on omeprazole so if calcium is low at next check (it is too soon to check today), she may need to be switched to calcium citrate. -No perioral numbness or acral cramping  Orders Placed This Encounter  Procedures   TSH   T4, free   - Total time spent for the visit: 1 hour, in precharting and post-charting, discussing with the referring provider, obtaining medical information from the chart and from the patient, reviewing her  previous labs, imaging evaluations, and treatments, reviewing her symptoms, counseling her about her thyroid condition (please see the discussed topics above), and developing a plan to further investigate and treat it; she had a number of questions which I addressed.  Addendum: Pathology returned after the visit: Procedure: Total thyroidectomy Tumor Focality: Unifocal Tumor Site: Right lobe Tumor Size: 2.3 x 1.5 x 1.2 cm Histologic Type: Papillary carcinoma, classic and infiltrative follicular subtypes Angioinvasion: Not identified Lymphatic Invasion: Not identified Extrathyroidal Extension: Tumor focally invades perithyroidal soft tissue Margin Status: All margins negative for carcinoma Regional Lymph Node Status:      Number of Lymph Nodes with Tumor: 0      Number of Lymph Nodes Examined: 2      Nodal Level(s) Examined: Level VI Distant Metastasis: Not applicable Pathologic Stage Classification (pTNM, AJCC 8th Edition): pT2b,  pN0  Tumor larger than 2 cm, but without lymph node lymphatic, or blood vessel invasion.  Clinical TNM stage II. Will proceed with the plan for RAI treatment as discussed.  Philemon Kingdom, MD PhD [facility]

## 2022-01-04 NOTE — Progress Notes (Signed)
Final thyroid pathology as expected with papillary and infiltrative follicular carcinoma.  Tumor to margins of thyroid, all gross disease removed, and lymph nodes are negative.  I will call to discuss with patient and her husband later today.  Will forward to Dr. Cruzita Lederer (endocrinology) who is aware and planning to see patient soon.  Armandina Gemma, MD [facility] [place] practice Office: [phone]

## 2022-01-04 NOTE — Patient Instructions (Addendum)
Please continue levothyroxine 100 mcg daily.  Take the thyroid hormone every day, with water, at least 30 minutes before breakfast, separated by at least 4 hours from: - acid reflux medications - calcium - iron - multivitamins  Move to lunchtime: - calcium - Omeprazole - multivitamins  Move coffee 30 min after levothyroxine.  Come back for labs in 5-6 weeks.  Please look up: [email or web address].  Regarding RAI treatment:  Radioactive Iodine Treatment for Thyroid Cancer  Radioactive iodine treatment is a treatment for thyroid cancer. The treatment involves swallowing a pill or liquid that contains a substance called radioactive iodine (I-131), or radioiodine. The substance is absorbed by the thyroid gland and destroys cancerous tissue. This treatment may be used to kill cancer cells that remain after surgery to remove the cancer. It may also be done for thyroid cancer that has spread or has come back after treatments. Tell a health care provider about: Any allergies you have. All medicines you are taking, including vitamins, herbs, eye drops, creams, and over-the-counter medicines. Any surgeries you have had. Any medical conditions you have. Any blood disorders you have. Whether you are pregnant, may be pregnant, or have gone through menopause, if this applies. Whether you are breastfeeding, if this applies. Any contact you have with children or pregnant women. Your travel plans for the next 3 months. Whether you pass through radiation detectors for work or travel. What are the risks? Generally, this is a safe procedure. However, problems may occur, including: Damage to other structures or organs, such as the salivary glands. This could lead to dry mouth and loss of taste. Increased risk for leukemia or other cancers. Lower sperm count or infertility in men. Irregular menstrual periods in women. What happens before the procedure? Eating and drinking restrictions Follow instructions  from your health care provider about eating or drinking restrictions. Follow a low-iodine diet as told by your health care provider. Check ingredients on packaged foods and beverages because there are foods that you will need to avoid while on the low-iodine diet: Avoid iodized table salt and foods that have iodized salt. Avoid seafood, seaweed, soybeans, and soy products. Avoid dairy products and eggs. Avoid the food dye Red No. 3 because it has iodine. Follow instructions from your health care provider about what you may eat and drink before your procedure. These may include: 8 hours before your procedure Stop eating most foods. Do not eat meat, fried foods, or fatty foods. Eat only light foods, such as toast or crackers. All liquids are okay except energy drinks and alcohol. 6 hours before your procedure Stop eating. Drink only clear liquids, such as water, clear fruit juice, black coffee, plain tea, and sports drinks. Do not drink energy drinks or alcohol. 2 hours before your procedure Stop drinking all liquids. You may be allowed to take medicines with small sips of water. If you do not follow your health care provider's instructions, your procedure may be delayed or canceled Tests and exams See your health care provider for any needed tests and exams. Before your treatment: You will have tests to check your thyroid hormone levels. You will take a small dose of radioactive iodine and have a body scan. The dose will be much smaller than your normal treatment dose. The body scan will show if your body is absorbing the iodine. You may get injections of thyroid-stimulating hormone to help your body absorb iodine. General instructions Ask your health care provider about changing or stopping your regular medicines.  You may have to stop taking your thyroid hormone medicine or other medicines. Plan to drive yourself home after treatment with no one else in the car. Do not take public  transportation. If you need someone to drive you home, sit as far away from the driver as possible. If you are breastfeeding, ask your health care provider when to stop. Radioactive iodine can pass into your milk. What happens during the procedure? You will have a body scan to check for cancerous cells in your thyroid. You may be given medicine to prevent nausea or vomiting. You will go to an isolated room with lead-lined walls. The room will protect others from the radioactive treatment that you will receive. You will take the radioactive iodine in liquid or pill form. You may have water to swallow the pills. You will have to stay in the isolated room with lead-lined walls for at least 2 hours. What happens after the procedure? You will have a body scan to check if the radioactive iodine is being absorbed by your thyroid. Do not eat or drink for 1-2 hours after the procedure as told by your health care provider. Your health care provider will discuss with you whether you need to stay in the hospital a few days. Follow instructions from your health care provider about avoiding contact with other people to protect them from exposure to radiation. Summary Radioactive iodine treatment is a treatment for thyroid cancer. You will have to stay in the isolated room with lead-lined walls for at least 2 hours after taking the radioactive iodine. Your health care provider will discuss with you whether you need to stay in the hospital a few days. Do not eat or drink for 1-2 hours after the procedure as told by your health care provider. Plan to drive yourself home after treatment with no one else in the car. Do not take public transportation. If you need someone to drive you home, sit as far away from the driver as possible. Follow instructions from your health care provider about avoiding contact with other people after the treatment. This information is not intended to replace advice given to you by your  health care provider. Make sure you discuss any questions you have with your health care provider. Document Revised: 05/03/2021 Document Reviewed: 07/06/2018 Else[employer] Patient Education  [place].  Radioactive Iodine Treatment for Thyroid Cancer, Care After This sheet gives you information about how to care for yourself after your procedure. Your health care provider may also give you more specific instructions. If you have problems or questions, contact your health care provider. What can I expect after the procedure? After the procedure, your body and all of your body fluids--such as sweat, urine, stool, and saliva--will be radioactive. It is common to have: Pain and swelling in the glands that make saliva (salivary glands). Dry mouth and less saliva. Changes in taste. Pain and swelling in the mouth and cheeks. Mild nausea. Swelling or pain in the neck. Follow these instructions at home: Eating and drinking  Do not eat or drink for 1-2 hours after the procedure. Do notshare food or drinks with other people for as long as told by your health care provider. Do not share utensils--such as silverware, plates, or cups--for as long as told by your health care provider. Use disposable utensils, or clean your utensils separately from those of others. Chew gum or suck on hard candy if your mouth is dry. Follow the diet that your health care provider  recommends. Drink enough fluid to keep your urine pale yellow. Lifestyle Avoid close contact with other people for as long as told by your health care provider. The radiation in your body can affect others. Avoiding contact with children and pregnant women is especially important because they are more sensitive to radiation. For as long as told by your health care provider: Sleep alone. Sleep in a separate bed if you normally share your bed with another person. Flush twice after using the toilet. Clean up any spills of urine or stool in the  bathroom right away. Avoid using public bathrooms. Wash your sheets, towels, and clothes separately from other people's items. Do not have close intimate contact of any kind. This includes kissing, physical contact, and sexual intercourse. Stay home from work or school as told by your health care provider. Avoid using public transportation or riding in cars with other people. Pregnancy and breastfeeding Do not try to become pregnant for as long as you are told by your health care provider. This may be for up to 1 year after your procedure. Use a method of birth control (contraception) to prevent pregnancy. Talk to your health care provider about what form of contraception is right for you. Do not breastfeed for as long as you are told by your health care provider, if this applies. General instructions Take over-the-counter and prescription medicines only as told by your health care provider. Keep all follow-up visits as told by your health care provider. This is important. Contact a health care provider if: You have severe nausea and vomiting. You are having problems eating or drinking. You are constipated or have diarrhea. Get help right away if: You have trouble breathing. Your salivary glands are very painful and swollen. Your saliva smells or tastes bad. Summary After the procedure, it is common to have a dry mouth, mild nausea, and some pain and swelling in the salivary glands, the neck, or the mouth and cheeks. Do notshare your utensils, food, or drinks with other people for as long as told by your health care provider. Avoid being around other people for as long as told by your health care provider. The radiation in your body can affect others. Avoiding contact with children and pregnant women is especially important. Drink enough fluid to keep your urine pale yellow. This information is not intended to replace advice given to you by your health care provider. Make sure you discuss  any questions you have with your health care provider. Document Revised: 05/03/2021 Document Reviewed: 07/06/2018 Elsevier Patient Education  [place].  Please return to see me in approximately 3 months.

## 2022-01-09 ENCOUNTER — Encounter: Payer: Self-pay | Admitting: *Deleted

## 2022-01-09 DIAGNOSIS — Z170 Estrogen receptor positive status [ER+]: Secondary | ICD-10-CM | POA: Insufficient documentation

## 2022-01-09 LAB — SURGICAL PATHOLOGY

## 2022-01-10 ENCOUNTER — Ambulatory Visit: Payer: 59 | Attending: Surgery | Admitting: Physical Therapy

## 2022-01-10 ENCOUNTER — Encounter: Payer: Self-pay | Admitting: Physical Therapy

## 2022-01-10 ENCOUNTER — Other Ambulatory Visit: Payer: Self-pay

## 2022-01-10 DIAGNOSIS — R293 Abnormal posture: Secondary | ICD-10-CM | POA: Insufficient documentation

## 2022-01-10 DIAGNOSIS — Z170 Estrogen receptor positive status [ER+]: Secondary | ICD-10-CM | POA: Diagnosis not present

## 2022-01-10 DIAGNOSIS — C50212 Malignant neoplasm of upper-inner quadrant of left female breast: Secondary | ICD-10-CM | POA: Diagnosis not present

## 2022-01-10 NOTE — Therapy (Signed)
OUTPATIENT PHYSICAL THERAPY BREAST CANCER BASELINE EVALUATION   Patient Name: [name] MRN: [id number] DOB:10-Feb-1960, 61 y.o., female Today's Date: 01/10/2022  END OF SESSION:  PT End of Session - 01/10/22 1102     Visit Number 1    Number of Visits 2    Date for PT Re-Evaluation 03/07/22    PT Start Time 1058    PT Stop Time 1202    PT Time Calculation (min) 64 min    Activity Tolerance Patient tolerated treatment well    Behavior During Therapy WFL for tasks assessed/performed             Past Medical History:  Diagnosis Date   Anxiety    Allergy induced asthma   Arthritis    Asthma    Crohn disease ([place])    Depression    GERD (gastroesophageal reflux disease)    History of methicillin resistant staphylococcus aureus (MRSA)    pt denies this.   Multiple thyroid nodules    Sleep apnea    CPAP   Thyroid cancer [facility])    Past Surgical History:  Procedure Laterality Date   ANAL FISSURE REPAIR  2004   BREAST LUMPECTOMY WITH RADIOACTIVE SEED LOCALIZATION Left 01/01/2022   Procedure: LEFT BREAST SEED LUMPECTOMY;  Surgeon: Erroll Luna, MD;  Location: [place];  Service: General;  Laterality: Left;   CESAREAN SECTION     COLONOSCOPY     INSERTION OF MESH N/A 07/26/2020   Procedure: INSERTION OF MESH;  Surgeon: Ralene Ok, MD;  Location: [place];  Service: General;  Laterality: N/A;   LIGAMENT REPAIR Right 11/23/2015   Procedure: right index radial collateral LIGAMENT REPAIR;  Surgeon: Leanora Cover, MD;  Location: [place];  Service: Orthopedics;  Laterality: Right;   THYROIDECTOMY N/A 01/01/2022   Procedure: TOTAL THYROIDECTOMY WITH LIMITED LYMPH NODE DISSECTION;  Surgeon: Armandina Gemma, MD;  Location: [place] [facility];  Service: General;  Laterality: N/A;   UPPER ENDOSCOPY W/ ESOPHAGEAL MANOMETRY  10/2021   VENTRAL HERNIA REPAIR N/A 07/26/2020   Procedure: LAPAROSCOPIC VENTRAL HERNIA REPAIR WITH MESH;  Surgeon: Ralene Ok, MD;  Location: [place];  Service: General;  Laterality: N/A;   Patient Active Problem List   Diagnosis Date Noted   Malignant neoplasm of upper-inner quadrant of left breast in female, estrogen receptor positive ([place]) 01/09/2022   Postsurgical hypothyroidism 01/04/2022   Multiple thyroid nodules 12/20/2021   Papillary thyroid carcinoma ([place]) 12/20/2021   Nodule of lower lobe of right lung 10/17/2021   Left upper lobe pulmonary nodule 10/17/2021   Acromioclavicular sprain, left, initial encounter 07/27/2021   Tibialis posterior tendinitis, right 06/15/2020   Piriformis syndrome of left side 94/70/9628   Umbilical hernia 36/62/9476   Lumbar radiculopathy 07/09/2019   Contusion of right hip and thigh 04/20/2019   Loss of transverse plantar arch 02/11/2019   Sprain of iliolumbar ligament 10/29/2018   Piriformis syndrome of right side 09/30/2018   Nonallopathic lesion of sacral region 09/30/2018   Nonallopathic lesion of lumbosacral region 09/30/2018   Nonallopathic lesion of thoracic region 09/30/2018   Peroneal tendinitis of left lower extremity 02/11/2018   Polyarthralgia 02/11/2018   Patellofemoral syndrome of right knee 02/11/2018   Gastroesophageal reflux disease 01/16/2017   Cough variant asthma 01/16/2017   Noncompliance with medication treatment due to intermittent use of medication 01/16/2017   Allergic rhinoconjunctivitis 11/08/2015   Sinobronchitis 10/03/2015   OSA (obstructive sleep apnea) 12/14/2014   Crohn's disease of colon ([place]) 05/24/2014  Depression 02/01/2014   Disorder of intestine 12/17/2010   Anorectal polyp 12/17/2010   Glaucoma 01/14/2010   Dry skin dermatitis 01/15/2008   Crohn's disease ([place]) 01/14/2005    REFERRING PROVIDER: Dr. Erroll Luna  REFERRING DIAG: C50.212,Z17.0 (ICD-10-CM) - Malignant neoplasm of upper-inner quadrant of left breast in female, estrogen receptor positive   THERAPY DIAG:  Malignant neoplasm of upper-inner quadrant of left breast in female,  estrogen receptor positive ([place])  Abnormal posture  Rationale for Evaluation and Treatment: Rehabilitation  ONSET DATE: 01/01/2022  SUBJECTIVE:                                                                                                                                                                                           SUBJECTIVE STATEMENT: Patient reports she is here today to be seen following a left lumpectomy and thyroidectomy for papillary thyroid cancer and left breast cancer. On 01/22/2022, she will undergo a re-excision for her breast cancer including a sentinel node biopsy. She reports that while she's received her pathology reports, she has not talked to anyone about them and doesn't really know anything about them yet.  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 It measures 1.5 cm and is located in the upper inner quadrant with IDC only being present in lateral margin. It is ER/PR positive and HER2 negative with a Ki-67 of 5%. Thyroidectomy was also performed at the same time and 2 lymph nodes removed (nodes were negative) and all gross disease removed. Thyroid cancer will be treated with radioactive iodine.  PATIENT GOALS:   reduce lymphedema risk and learn post op HEP.   PAIN:  Are you having pain? No  PRECAUTIONS: Active CA Other: Thyroid cancer  HAND DOMINANCE: right  WEIGHT BEARING RESTRICTIONS: No  FALLS:  Has patient fallen in last 6 months? No  LIVING ENVIRONMENT: Patient lives with: husband, niece and her 2 children who are 98 and 38 y.o. Lives in: House/apartment Has following equipment at home: None  OCCUPATION: Not working  LEISURE: Set designer and walks/hikes; does some kind of cardio 4x/week  PRIOR LEVEL OF FUNCTION: Independent   OBJECTIVE:  COGNITION: Overall cognitive status: Within functional limits for tasks assessed    POSTURE:  Forward head and  rounded shoulders posture  UPPER EXTREMITY AROM/PROM:  A/PROM RIGHT   eval   Shoulder extension 40  Shoulder flexion 141  Shoulder abduction 152  Shoulder internal rotation 46  Shoulder external rotation 87    (Blank rows = not tested)  A/PROM LEFT   eval  Shoulder extension 40  Shoulder flexion 134  Shoulder abduction 157  Shoulder internal rotation 45  Shoulder external rotation 87    (Blank rows = not tested)  CERVICAL AROM: Not tested due to recent thyroidectomy   UPPER EXTREMITY STRENGTH: WNL  LYMPHEDEMA ASSESSMENTS:   LANDMARK RIGHT   eval  10 cm proximal to olecranon process 26.7  Olecranon process 24.2  10 cm proximal to ulnar styloid process 20.9  Just proximal to ulnar styloid process 14.7  Across hand at thumb web space 20  At base of 2nd digit 6.2  (Blank rows = not tested)  LANDMARK LEFT   eval  10 cm proximal to olecranon process 26.3  Olecranon process 23.5  10 cm proximal to ulnar styloid process 20.7  Just proximal to ulnar styloid process 14.2  Across hand at thumb web space 20.2  At base of 2nd digit 6.2  (Blank rows = not tested)  L-DEX LYMPHEDEMA SCREENING:  The patient was assessed using the L-Dex machine today to produce a lymphedema index baseline score. The patient will be reassessed on a regular basis (typically every 3 months) to obtain new L-Dex scores. If the score is > 6.5 points away from his/her baseline score indicating onset of subclinical lymphedema, it will be recommended to wear a compression garment for 4 weeks, 12 hours per day and then be reassessed. If the score continues to be > 6.5 points from baseline at reassessment, we will initiate lymphedema treatment. Assessing in this manner has a 95% rate of preventing clinically significant lymphedema.   L-DEX FLOWSHEETS - 01/10/22 1200       L-DEX LYMPHEDEMA SCREENING   Measurement Type Unilateral    L-DEX MEASUREMENT EXTREMITY Upper Extremity    POSITION  Standing     DOMINANT SIDE Right    At Risk Side Left    BASELINE SCORE (UNILATERAL) 2.8             QUICK DASH SURVEY:  Katina Dung - 01/10/22 0001     Open a tight or new jar Moderate difficulty    Do heavy household chores (wash walls, wash floors) Moderate difficulty    Carry a shopping bag or briefcase Moderate difficulty    Wash your back Moderate difficulty    Use a knife to cut food No difficulty    Recreational activities in which you take some force or impact through your arm, shoulder, or hand (golf, hammering, tennis) Mild difficulty    During the past week, to what extent has your arm, shoulder or hand problem interfered with your normal social activities with family, friends, neighbors, or groups? Slightly    During the past week, to what extent has your arm, shoulder or hand problem limited your work or other regular daily activities Not at all    Arm, shoulder, or hand pain. Moderate    Tingling (pins and needles) in your arm, shoulder, or hand None    Difficulty Sleeping Mild difficulty    DASH Score 29.55 %              PATIENT EDUCATION:  Education details: Lymphedema risk reduction and post op shoulder/posture HEP Person educated: Patient Education method: Explanation, Demonstration, Handout Education comprehension: Patient verbalized understanding and returned demonstration  HOME EXERCISE PROGRAM: Patient was instructed today in a home exercise program today for post op shoulder range of motion. These included active assist shoulder flexion in sitting, scapular retraction, wall walking with shoulder abduction, and hands behind head external rotation.  She  was encouraged to do these twice a day, holding 3 seconds and repeating 5 times when permitted by her physician.   ASSESSMENT:  CLINICAL IMPRESSION: Patient is doing well s/p left lumpectomy for breast cancer and thyroidectomy for thyroid papillary carcinoma. She is having a left breast re-excision on 01/22/2022  with a sentinel node biopsy. She will see a medical oncologist (Dr. Lindi Adie) on 01/16/2022 and also a radiation oncologist. She will benefit from a post op PT reassessment to determine needs and from L-Dex screens every 3 months for 2 years to detect subclinical lymphedema. She had questions about her pathology report and those were answered within scope of PT.  Pt will benefit from skilled therapeutic intervention to improve on the following deficits: Decreased knowledge of precautions, impaired UE functional use, pain, decreased ROM, postural dysfunction.   PT treatment/interventions: ADL/self-care home management, pt/family education, therapeutic exercise  REHAB POTENTIAL: Excellent  CLINICAL DECISION MAKING: Stable/uncomplicated  EVALUATION COMPLEXITY: Low   GOALS: Goals reviewed with patient? YES  LONG TERM GOALS: (STG=LTG)    Name Target Date Goal status  1 Pt will be able to verbalize understanding of pertinent lymphedema risk reduction practices relevant to her dx specifically related to skin care.  Baseline:  No knowledge 01/10/2022 Achieved at eval  2 Pt will be able to return demo and/or verbalize understanding of the post op HEP related to regaining shoulder ROM. Baseline:  No knowledge 01/10/2022 Achieved at eval  3 Pt will be able to verbalize understanding of the importance of attending the post op After Breast CA Class for further lymphedema risk reduction education and therapeutic exercise.  Baseline:  No knowledge 01/10/2022 Achieved at eval  4 Pt will demo she has regained full shoulder ROM and function post operatively compared to baselines.  Baseline: See objective measurements taken today. 03/07/2022     PLAN:  PT FREQUENCY/DURATION: EVAL and 1 follow up appointment.   PLAN FOR NEXT SESSION: will reassess 3-4 weeks post op to determine needs.   Patient will follow up at outpatient cancer rehab 3-4 weeks following surgery.  If the patient requires physical therapy  at that time, a specific plan will be dictated and sent to the referring physician for approval. The patient was educated today on appropriate basic range of motion exercises to begin post operatively and the importance of attending the After Breast Cancer class following surgery.  Patient was educated today on lymphedema risk reduction practices as it pertains to recommendations that will benefit the patient immediately following surgery.  She verbalized good understanding.    Physical Therapy Information for After Breast Cancer Surgery/Treatment:  Lymphedema is a swelling condition that you may be at risk for in your arm if you have lymph nodes removed from the armpit area.  After a sentinel node biopsy, the risk is approximately 5-9% and is higher after an axillary node dissection.  There is treatment available for this condition and it is not life-threatening.  Contact your physician or physical therapist with concerns. You may begin the 4 shoulder/posture exercises (see additional sheet) when permitted by your physician (typically a week after surgery).  If you have drains, you may need to wait until those are removed before beginning range of motion exercises.  A general recommendation is to not lift your arms above shoulder height until drains are removed.  These exercises should be done to your tolerance and gently.  This is not a "no pain/no gain" type of recovery so listen to your body and  stretch into the range of motion that you can tolerate, stopping if you have pain.  If you are having immediate reconstruction, ask your plastic surgeon about doing exercises as he or she may want you to wait. We encourage you to attend the free one time ABC (After Breast Cancer) class offered by [facility].  You will learn information related to lymphedema risk, prevention and treatment and additional exercises to regain mobility following surgery.  You can call [phone] for more  information.  This is offered the 1st and 3rd Monday of each month.  You only attend the class one time. While undergoing any medical procedure or treatment, try to avoid blood pressure being taken or needle sticks from occurring on the arm on the side of cancer.   This recommendation begins after surgery and continues for the rest of your life.  This may help reduce your risk of getting lymphedema (swelling in your arm). An excellent resource for those seeking information on lymphedema is the National Lymphedema Network's web site. It can be accessed at [facility] If you notice swelling in your hand, arm or breast at any time following surgery (even if it is many years from now), please contact your doctor or physical therapist to discuss this.  Lymphedema can be treated at any time but it is easier for you if it is treated early on.  If you feel like your shoulder motion is not returning to normal in a reasonable amount of time, please contact your surgeon or physical therapist.  [place] [phone]. [address], [address], [place] [address]  ABC CLASS After Breast Cancer Class  After Breast Cancer Class is a specially designed exercise class to assist you in a safe recover after having breast cancer surgery.  In this class you will learn how to get back to full function whether your drains were just removed or if you had surgery a month ago.  This one-time class is held the 1st and 3rd Monday of every month from 11:00 a.m. until 12:00 noon virtually.  This class is FREE and space is limited. For more information or to register for the next available class, call [phone].  Class Goals  Understand specific stretches to improve the flexibility of you chest and shoulder. Learn ways to safely strengthen your upper body and improve your posture. Understand the warning signs of infection and why you may be at risk for an arm infection. Learn  about Lymphedema and prevention.  ** You do not attend this class until after surgery.  Drains must be removed to participate  Patient was instructed today in a home exercise program today for post op shoulder range of motion. These included active assist shoulder flexion in sitting, scapular retraction, wall walking with shoulder abduction, and hands behind head external rotation.  She was encouraged to do these twice a day, holding 3 seconds and repeating 5 times when permitted by her physician.  Annia Friendly, [place] 01/10/22 12:15 PM

## 2022-01-16 ENCOUNTER — Inpatient Hospital Stay
Admission: RE | Admit: 2022-01-16 | Discharge: 2022-01-16 | Disposition: A | Discharge status: Home / Self Care | Payer: Self-pay | Source: Ambulatory Visit | Attending: Radiation Oncology | Admitting: Radiation Oncology

## 2022-01-16 ENCOUNTER — Ambulatory Visit: Payer: Self-pay | Admitting: Surgery

## 2022-01-16 ENCOUNTER — Other Ambulatory Visit: Payer: Self-pay | Admitting: Radiation Oncology

## 2022-01-16 ENCOUNTER — Inpatient Hospital Stay: Payer: 59 | Attending: Hematology and Oncology | Admitting: Hematology and Oncology

## 2022-01-16 ENCOUNTER — Ambulatory Visit
Admission: RE | Admit: 2022-01-16 | Discharge: 2022-01-16 | Disposition: A | Discharge status: Home / Self Care | Payer: Self-pay | Source: Ambulatory Visit | Attending: Radiation Oncology | Admitting: Radiation Oncology

## 2022-01-16 ENCOUNTER — Telehealth: Payer: Self-pay | Admitting: *Deleted

## 2022-01-16 ENCOUNTER — Encounter (HOSPITAL_BASED_OUTPATIENT_CLINIC_OR_DEPARTMENT_OTHER): Payer: Self-pay | Admitting: Surgery

## 2022-01-16 ENCOUNTER — Other Ambulatory Visit: Payer: Self-pay

## 2022-01-16 ENCOUNTER — Encounter: Payer: Self-pay | Admitting: *Deleted

## 2022-01-16 VITALS — BP 124/69 | HR 71 | Temp 97.4°F | Resp 18 | Ht 62.0 in | Wt 151.2 lb

## 2022-01-16 DIAGNOSIS — Z170 Estrogen receptor positive status [ER+]: Secondary | ICD-10-CM | POA: Diagnosis not present

## 2022-01-16 DIAGNOSIS — C73 Malignant neoplasm of thyroid gland: Secondary | ICD-10-CM | POA: Diagnosis not present

## 2022-01-16 DIAGNOSIS — Z5111 Encounter for antineoplastic chemotherapy: Secondary | ICD-10-CM | POA: Diagnosis not present

## 2022-01-16 DIAGNOSIS — Z5189 Encounter for other specified aftercare: Secondary | ICD-10-CM | POA: Diagnosis not present

## 2022-01-16 DIAGNOSIS — C50212 Malignant neoplasm of upper-inner quadrant of left female breast: Secondary | ICD-10-CM | POA: Insufficient documentation

## 2022-01-16 NOTE — Assessment & Plan Note (Signed)
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) 0/2 lymph nodes)  Pathology and radiology counseling:Discussed with the patient, the details of pathology including the type of breast cancer,the clinical staging, the significance of ER, PR and HER-2/neu receptors and the implications for treatment. After reviewing the pathology in detail, we proceeded to discuss the different treatment options between surgery, radiation, chemotherapy, antiestrogen therapies.  Recommendations: 1.  Lateral margin reexcision and sentinel lymph node biopsy 2. Oncotype DX testing to determine if chemotherapy would be of any benefit followed by 3. Adjuvant radiation therapy followed by 4. Adjuvant antiestrogen therapy  Oncotype counseling: I discussed Oncotype DX test. I explained to the patient that this is a 21 gene panel to evaluate patient tumors DNA to calculate recurrence score. This would help determine whether patient has high risk or low risk breast cancer. She understands that if her tumor was found to be high risk, she would benefit from systemic chemotherapy. If low risk, no need of chemotherapy.  Return to clinic after surgery to discuss final pathology report and then determine if Oncotype DX testing will need to be sent.

## 2022-01-16 NOTE — Progress Notes (Signed)

## 2022-01-16 NOTE — Progress Notes (Signed)
[facility] CONSULT NOTE  Patient Care Team: [name], Cresenciano Lick, MD as PCP - General (Internal Medicine) Mauro Kaufmann, RN as Oncology Nurse Navigator Rockwell Germany, RN as Oncology Nurse Navigator Nicholas Lose, MD as Consulting Physician (Hematology and Oncology)  CHIEF COMPLAINTS/PURPOSE OF CONSULTATION:  Newly diagnosed breast cancer  HISTORY OF PRESENTING ILLNESS:  [name] 62 y.o. female is here because of recent diagnosis of left breast cancer.  Patient had a routine mammogram in the left breast which was evaluated but the biopsy which revealed that it was [facility].  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  I reviewed her records extensively and collaborated the history with the patient.  SUMMARY OF ONCOLOGIC HISTORY: Oncology History  Malignant neoplasm of upper-inner quadrant of left breast in female, estrogen receptor positive ([place])  01/01/2022 Surgery   Initial biopsy left breast: [facility] Left lumpectomy: 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, lateral margin positive ER 90%, PR 100%, Ki-67 5%, HER2 equivocal by IHC 2+, FISH negative ratio 0.93 (Total thyroidectomy: Papillary carcinoma right lobe, classic and follicular subtype 2.3 cm) 0/2 lymph nodes)      MEDICAL HISTORY:  Past Medical History:  Diagnosis Date   Anxiety    Allergy induced asthma   Arthritis    Asthma    Crohn disease ([place])    Depression    GERD (gastroesophageal reflux disease)    History of methicillin resistant staphylococcus aureus (MRSA)    pt denies this.   Multiple thyroid nodules    Sleep apnea    CPAP   Thyroid cancer ([place])     SURGICAL HISTORY: Past Surgical History:  Procedure Laterality Date   ANAL FISSURE REPAIR  2004   BREAST  LUMPECTOMY WITH RADIOACTIVE SEED LOCALIZATION Left 01/01/2022   Procedure: LEFT BREAST SEED LUMPECTOMY;  Surgeon: Erroll Luna, MD;  Location: [place];  Service: General;  Laterality: Left;   CESAREAN SECTION     COLONOSCOPY     INSERTION OF MESH N/A 07/26/2020   Procedure: INSERTION OF MESH;  Surgeon: Ralene Ok, MD;  Location: [place];  Service: General;  Laterality: N/A;   LIGAMENT REPAIR Right 11/23/2015   Procedure: right index radial collateral LIGAMENT REPAIR;  Surgeon: Leanora Cover, MD;  Location: [place];  Service: Orthopedics;  Laterality: Right;   THYROIDECTOMY N/A 01/01/2022   Procedure: TOTAL THYROIDECTOMY WITH LIMITED LYMPH NODE DISSECTION;  Surgeon: Armandina Gemma, MD;  Location: [place];  Service: General;  Laterality: N/A;   UPPER ENDOSCOPY W/ ESOPHAGEAL MANOMETRY  10/2021   VENTRAL HERNIA REPAIR N/A 07/26/2020   Procedure: LAPAROSCOPIC VENTRAL HERNIA REPAIR WITH MESH;  Surgeon: Ralene Ok, MD;  Location: [place];  Service: General;  Laterality: N/A;    SOCIAL HISTORY: Social History   Socioeconomic History   Marital status: Married    Spouse name: Not on file   Number of children: 2   Years of education: Not on file   Highest education level: Not on file  Occupational History   Occupation: Self employed  Tobacco Use   Smoking status: Never    Passive exposure: Never   Smokeless tobacco: Never  Vaping Use   Vaping Use: Never used  Substance and Sexual Activity   Alcohol use:  Yes    Alcohol/week: 10.0 standard drinks of alcohol    Types: 10 Glasses of wine per week    Comment: 10 glasses/week   Drug use: No   Sexual activity: Yes    Birth control/protection: Post-menopausal  Other Topics Concern   Not on file  Social History Narrative   Not on file   Social Determinants of Health   Financial Resource Strain: Not on file  Food Insecurity: Not on file  Transportation Needs: Not on file  Physical Activity: Not on file  Stress: Not  on file  Social Connections: Not on file  Intimate Partner Violence: Not on file    FAMILY HISTORY: Family History  Problem Relation Age of Onset   Alcohol abuse Mother    Alcohol abuse Sister    Angioedema Neg Hx    Allergic rhinitis Neg Hx    Asthma Neg Hx    Atopy Neg Hx    Eczema Neg Hx    Immunodeficiency Neg Hx    Urticaria Neg Hx    Colon cancer Neg Hx    Stomach cancer Neg Hx    Esophageal cancer Neg Hx    Colon polyps Neg Hx     ALLERGIES:  is allergic to chlorhexidine gluconate, gabapentin, hydrocodone, oxycodone hcl, and betadine [povidone iodine].  MEDICATIONS:  Current Outpatient Medications  Medication Sig Dispense Refill   acetaminophen (TYLENOL) 500 MG tablet Take 500-1,000 mg by mouth every 6 (six) hours as needed (pain).     Adalimumab (HUMIRA) 40 MG/0.4ML PSKT Inject 0.4 mLs (40 mg total) under the skin every 14 (fourteen) days. 2 each 5   albuterol (VENTOLIN HFA) 108 (90 Base) MCG/ACT inhaler Inhale 2 puffs into the lungs every 6 (six) hours as needed for wheezing or shortness of breath. 8.5 g 3   ascorbic acid (VITAMIN C) 500 MG tablet Take 500 mg by mouth 2 (two) times a week.     bimatoprost (LUMIGAN) 0.01 % SOLN Instill 1 drop into both eyes at bedtime 2.5 mL 10   busPIRone (BUSPAR) 7.5 MG tablet Take 1 tablet (7.5 mg total) by mouth 2 (two) times daily. 180 tablet 2   calcium carbonate (TUMS) 500 MG chewable tablet Chew 2 tablets (400 mg of elemental calcium total) by mouth 2 (two) times daily. 90 tablet 1   celecoxib (CELEBREX) 100 MG capsule Take 1 capsule (100 mg total) by mouth in the morning and at bedtime. 60 capsule 11   cetirizine (ZYRTEC) 10 MG tablet Take 10 mg by mouth in the morning.     DULoxetine (CYMBALTA) 60 MG capsule Take 1 capsule (60 mg total) by mouth daily. 90 capsule 1   fluocinonide ointment (LIDEX) 1.61 % Apply 1 application topically daily as needed (irritation).     fluticasone (FLONASE) 50 MCG/ACT nasal spray Place 2 sprays  into both nostrils daily. 16 g 1   levothyroxine (SYNTHROID) 100 MCG tablet Take 1 tablet (100 mcg total) by mouth daily before breakfast. 30 tablet 2   omeprazole (PRILOSEC) 40 MG capsule Take 1 capsule (40 mg total) by mouth daily. 90 capsule 3   Vitamin D, Ergocalciferol, (DRISDOL) 1.25 MG (50000 UNIT) CAPS capsule Take 1 capsule (50,000 Units total) by mouth every 7 (seven) days. 12 capsule 0   Current Facility-Administered Medications  Medication Dose Route Frequency Provider Last Rate Last Admin   0.9 %  sodium chloride infusion  500 mL Intravenous Continuous Armbruster, Carlota Raspberry, MD  REVIEW OF SYSTEMS:   Constitutional: Denies fevers, chills or abnormal night sweats   All other systems were reviewed with the patient and are negative.  PHYSICAL EXAMINATION: ECOG PERFORMANCE STATUS: 1 - Symptomatic but completely ambulatory  Vitals:   01/16/22 1335  BP: 124/69  Pulse: 71  Resp: 18  Temp: (!) 97.4 F (36.3 C)  SpO2: 97%   Filed Weights   01/16/22 1335  Weight: 151 lb 3.2 oz (68.6 kg)    GENERAL:alert, no distress and comfortable    LABORATORY DATA:  I have reviewed the data as listed Lab Results  Component Value Date   WBC 5.8 12/25/2021   HGB 12.4 12/25/2021   HCT 38.1 12/25/2021   MCV 93.8 12/25/2021   PLT 270 12/25/2021   Lab Results  Component Value Date   NA 135 01/02/2022   K 3.9 01/02/2022   CL 104 01/02/2022   CO2 22 01/02/2022    RADIOGRAPHIC STUDIES: I have personally reviewed the radiological reports and agreed with the findings in the report.  ASSESSMENT AND PLAN:  Malignant neoplasm of upper-inner quadrant of left breast in female, estrogen receptor positive ([place]) 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, lateral margin positive ER 90%, PR 100%, Ki-67 5%, HER2 equivocal by IHC 2+, FISH negative ratio 0.93 (Total thyroidectomy: Papillary carcinoma right lobe, classic  and follicular subtype 2.3 cm) 0/2 lymph nodes)  Pathology and radiology counseling:Discussed with the patient, the details of pathology including the type of breast cancer,the clinical staging, the significance of ER, PR and HER-2/neu receptors and the implications for treatment. After reviewing the pathology in detail, we proceeded to discuss the different treatment options between surgery, radiation, chemotherapy, antiestrogen therapies.  Recommendations: 1.  Lateral margin reexcision and sentinel lymph node biopsy 2. Oncotype DX testing to determine if chemotherapy would be of any benefit followed by 3. Adjuvant radiation therapy followed by 4. Adjuvant antiestrogen therapy  Oncotype counseling: I discussed Oncotype DX test. I explained to the patient that this is a 21 gene panel to evaluate patient tumors DNA to calculate recurrence score. This would help determine whether patient has high risk or low risk breast cancer. She understands that if her tumor was found to be high risk, she would benefit from systemic chemotherapy. If low risk, no need of chemotherapy.  Patient is an avid Firefighter and she hopes to maintain her tennis game after the surgery and is concerned about the x-ray lymph node surgery. Her husband is a cardiologist at [facility] [employer] [facility].   Return to clinic based upon Oncotype DX test result.   All questions were answered. The patient knows to call the clinic with any problems, questions or concerns.    Harriette Ohara, MD 01/16/22

## 2022-01-16 NOTE — Telephone Encounter (Signed)
Received order for oncotype testing. Requisition faxed to pathology 

## 2022-01-17 ENCOUNTER — Ambulatory Visit: Payer: 59 | Admitting: Gastroenterology

## 2022-01-21 ENCOUNTER — Other Ambulatory Visit (HOSPITAL_COMMUNITY): Payer: Self-pay

## 2022-01-21 ENCOUNTER — Telehealth: Payer: Self-pay

## 2022-01-21 ENCOUNTER — Other Ambulatory Visit: Payer: Self-pay | Admitting: Internal Medicine

## 2022-01-21 DIAGNOSIS — C50912 Malignant neoplasm of unspecified site of left female breast: Secondary | ICD-10-CM | POA: Diagnosis not present

## 2022-01-21 MED ORDER — LEVOTHYROXINE SODIUM 50 MCG PO TABS
100.0000 ug | ORAL_TABLET | Freq: Every day | ORAL | 3 refills | Status: DC
  Filled 2022-01-21: qty 60, 30d supply, fill #0
  Filled 2022-02-15: qty 60, 30d supply, fill #1

## 2022-01-21 NOTE — Telephone Encounter (Signed)
T, This could be due to an intolerance to the dyes and other excipients in the tablet.  I sent a prescription for the 50 mcg levothyroxine dose to her pharmacy.  Please have her take 2 tablets in the morning for the next 30 days and then come for labs.  It is too soon now to check her thyroid function tests to verify the dose.  Please let us know if the symptoms persist within the next week or so.  In that case, we may need to decrease the dose.

## 2022-01-21 NOTE — Telephone Encounter (Signed)
Pt contacted and advised rx sent for 50 mcg to pharmacy. Pt advised to follow up in 1 week if she does not notice change. Labs to be r/s for 4 weeks out.

## 2022-01-21 NOTE — Telephone Encounter (Signed)
Pt called to advise she has experienced some heart palpitations headache and stomach ache as well since starting Levothyroxine. Pt wants to know if she should  come in sooner for labs to see what's going on.

## 2022-01-22 ENCOUNTER — Other Ambulatory Visit: Payer: Self-pay

## 2022-01-22 ENCOUNTER — Other Ambulatory Visit (HOSPITAL_COMMUNITY): Payer: Self-pay

## 2022-01-22 ENCOUNTER — Ambulatory Visit (HOSPITAL_BASED_OUTPATIENT_CLINIC_OR_DEPARTMENT_OTHER): Payer: 59 | Admitting: Anesthesiology

## 2022-01-22 ENCOUNTER — Encounter (HOSPITAL_BASED_OUTPATIENT_CLINIC_OR_DEPARTMENT_OTHER): Payer: Self-pay | Admitting: Surgery

## 2022-01-22 ENCOUNTER — Ambulatory Visit (HOSPITAL_BASED_OUTPATIENT_CLINIC_OR_DEPARTMENT_OTHER)
Admission: RE | Admit: 2022-01-22 | Discharge: 2022-01-22 | Disposition: A | Discharge status: Home / Self Care | Payer: 59 | Attending: Surgery | Admitting: Surgery

## 2022-01-22 ENCOUNTER — Encounter (HOSPITAL_BASED_OUTPATIENT_CLINIC_OR_DEPARTMENT_OTHER)
Admission: RE | Disposition: A | Discharge status: Home / Self Care | Payer: Self-pay | Source: Home / Self Care | Attending: Surgery

## 2022-01-22 DIAGNOSIS — G8918 Other acute postprocedural pain: Secondary | ICD-10-CM | POA: Diagnosis not present

## 2022-01-22 DIAGNOSIS — G4730 Sleep apnea, unspecified: Secondary | ICD-10-CM | POA: Insufficient documentation

## 2022-01-22 DIAGNOSIS — Z01818 Encounter for other preprocedural examination: Secondary | ICD-10-CM

## 2022-01-22 DIAGNOSIS — C50412 Malignant neoplasm of upper-outer quadrant of left female breast: Secondary | ICD-10-CM

## 2022-01-22 DIAGNOSIS — C50912 Malignant neoplasm of unspecified site of left female breast: Secondary | ICD-10-CM | POA: Diagnosis not present

## 2022-01-22 DIAGNOSIS — E890 Postprocedural hypothyroidism: Secondary | ICD-10-CM | POA: Diagnosis not present

## 2022-01-22 HISTORY — PX: RE-EXCISION OF BREAST LUMPECTOMY: SHX6048

## 2022-01-22 HISTORY — PX: SENTINEL NODE BIOPSY: SHX6608

## 2022-01-22 SURGERY — EXCISION, LESION, BREAST
Anesthesia: General | Site: Breast | Laterality: Left

## 2022-01-22 MED ORDER — SODIUM CHLORIDE 0.9 % IV SOLN
INTRAVENOUS | Status: AC
  Filled 2022-01-22: qty 10

## 2022-01-22 MED ORDER — ONDANSETRON HCL 4 MG/2ML IJ SOLN
INTRAMUSCULAR | Status: AC
  Filled 2022-01-22: qty 2

## 2022-01-22 MED ORDER — FENTANYL CITRATE (PF) 100 MCG/2ML IJ SOLN
25.0000 ug | INTRAMUSCULAR | Status: DC | PRN
  Administered 2022-01-22: 25 ug via INTRAVENOUS

## 2022-01-22 MED ORDER — ONDANSETRON HCL 4 MG/2ML IJ SOLN: 4.0000 mg | Freq: Once | INTRAMUSCULAR | Status: DC | PRN

## 2022-01-22 MED ORDER — FENTANYL CITRATE (PF) 100 MCG/2ML IJ SOLN
INTRAMUSCULAR | Status: AC
  Filled 2022-01-22: qty 2

## 2022-01-22 MED ORDER — ROPIVACAINE HCL 5 MG/ML IJ SOLN
INTRAMUSCULAR | Status: DC | PRN
  Administered 2022-01-22: 30 mL via PERINEURAL

## 2022-01-22 MED ORDER — PROPOFOL 10 MG/ML IV BOLUS
INTRAVENOUS | Status: AC
  Filled 2022-01-22: qty 20

## 2022-01-22 MED ORDER — IBUPROFEN 800 MG PO TABS
800.0000 mg | ORAL_TABLET | Freq: Three times a day (TID) | ORAL | 0 refills | Status: DC | PRN
  Filled 2022-01-22: qty 30, 10d supply, fill #0

## 2022-01-22 MED ORDER — MIDAZOLAM HCL 2 MG/2ML IJ SOLN
INTRAMUSCULAR | Status: AC
  Filled 2022-01-22: qty 2

## 2022-01-22 MED ORDER — BUPIVACAINE-EPINEPHRINE (PF) 0.5% -1:200000 IJ SOLN
INTRAMUSCULAR | Status: DC | PRN
  Administered 2022-01-22: 20 mL

## 2022-01-22 MED ORDER — FENTANYL CITRATE (PF) 100 MCG/2ML IJ SOLN
50.0000 ug | Freq: Once | INTRAMUSCULAR | Status: AC
  Administered 2022-01-22: 50 ug via INTRAVENOUS

## 2022-01-22 MED ORDER — MIDAZOLAM HCL 2 MG/2ML IJ SOLN
1.0000 mg | Freq: Once | INTRAMUSCULAR | Status: AC
  Administered 2022-01-22: 1 mg via INTRAVENOUS

## 2022-01-22 MED ORDER — ACETAMINOPHEN 500 MG PO TABS
ORAL_TABLET | ORAL | Status: AC
  Filled 2022-01-22: qty 2

## 2022-01-22 MED ORDER — DEXAMETHASONE SODIUM PHOSPHATE 10 MG/ML IJ SOLN
INTRAMUSCULAR | Status: DC | PRN
  Administered 2022-01-22: 10 mg via INTRAVENOUS

## 2022-01-22 MED ORDER — EPHEDRINE 5 MG/ML INJ
INTRAVENOUS | Status: AC
  Filled 2022-01-22: qty 5

## 2022-01-22 MED ORDER — ACETAMINOPHEN 500 MG PO TABS
1000.0000 mg | ORAL_TABLET | ORAL | Status: AC
  Administered 2022-01-22: 1000 mg via ORAL

## 2022-01-22 MED ORDER — LACTATED RINGERS IV SOLN: INTRAVENOUS | Status: DC

## 2022-01-22 MED ORDER — LIDOCAINE HCL (CARDIAC) PF 100 MG/5ML IV SOSY
PREFILLED_SYRINGE | INTRAVENOUS | Status: DC | PRN
  Administered 2022-01-22: 100 mg via INTRAVENOUS

## 2022-01-22 MED ORDER — FENTANYL CITRATE (PF) 100 MCG/2ML IJ SOLN
INTRAMUSCULAR | Status: DC | PRN
  Administered 2022-01-22: 25 ug via INTRAVENOUS
  Administered 2022-01-22: 50 ug via INTRAVENOUS
  Administered 2022-01-22: 25 ug via INTRAVENOUS

## 2022-01-22 MED ORDER — LIDOCAINE 2% (20 MG/ML) 5 ML SYRINGE
INTRAMUSCULAR | Status: AC
  Filled 2022-01-22: qty 5

## 2022-01-22 MED ORDER — EPHEDRINE SULFATE (PRESSORS) 50 MG/ML IJ SOLN
INTRAMUSCULAR | Status: DC | PRN
  Administered 2022-01-22: 5 mg via INTRAVENOUS

## 2022-01-22 MED ORDER — KETOROLAC TROMETHAMINE 30 MG/ML IJ SOLN
INTRAMUSCULAR | Status: AC
  Filled 2022-01-22: qty 1

## 2022-01-22 MED ORDER — ONDANSETRON HCL 4 MG/2ML IJ SOLN
INTRAMUSCULAR | Status: DC | PRN
  Administered 2022-01-22: 4 mg via INTRAVENOUS

## 2022-01-22 MED ORDER — OXYCODONE HCL 5 MG PO TABS
5.0000 mg | ORAL_TABLET | Freq: Four times a day (QID) | ORAL | 0 refills | Status: DC | PRN
  Filled 2022-01-22: qty 15, 4d supply, fill #0

## 2022-01-22 MED ORDER — CEFAZOLIN SODIUM-DEXTROSE 2-4 GM/100ML-% IV SOLN
2.0000 g | INTRAVENOUS | Status: AC
  Administered 2022-01-22: 2 g via INTRAVENOUS

## 2022-01-22 MED ORDER — SODIUM CHLORIDE 0.9 % IV SOLN
INTRAVENOUS | Status: DC | PRN
  Administered 2022-01-22: 500 mL

## 2022-01-22 MED ORDER — PROPOFOL 10 MG/ML IV BOLUS
INTRAVENOUS | Status: DC | PRN
  Administered 2022-01-22: 160 mg via INTRAVENOUS

## 2022-01-22 MED ORDER — DEXAMETHASONE SODIUM PHOSPHATE 10 MG/ML IJ SOLN
INTRAMUSCULAR | Status: AC
  Filled 2022-01-22: qty 1

## 2022-01-22 MED ORDER — KETOROLAC TROMETHAMINE 30 MG/ML IJ SOLN
30.0000 mg | Freq: Once | INTRAMUSCULAR | Status: AC | PRN
  Administered 2022-01-22: 30 mg via INTRAVENOUS

## 2022-01-22 MED ORDER — CEFAZOLIN SODIUM-DEXTROSE 2-4 GM/100ML-% IV SOLN
INTRAVENOUS | Status: AC
  Filled 2022-01-22: qty 100

## 2022-01-22 SURGICAL SUPPLY — 49 items
ADH SKN CLS APL DERMABOND .7 (GAUZE/BANDAGES/DRESSINGS) ×2
APL PRP STRL LF DISP 70% ISPRP (MISCELLANEOUS)
APPLIER CLIP 9.375 MED OPEN (MISCELLANEOUS) ×2
APR CLP MED 9.3 20 MLT OPN (MISCELLANEOUS) ×2
BINDER BREAST LRG (GAUZE/BANDAGES/DRESSINGS) IMPLANT
BINDER BREAST MEDIUM (GAUZE/BANDAGES/DRESSINGS) IMPLANT
BINDER BREAST XLRG (GAUZE/BANDAGES/DRESSINGS) IMPLANT
BINDER BREAST XXLRG (GAUZE/BANDAGES/DRESSINGS) IMPLANT
BLADE SURG 15 STRL LF DISP TIS (BLADE) ×2 IMPLANT
BLADE SURG 15 STRL SS (BLADE) ×2
CANISTER SUCT 1200ML W/VALVE (MISCELLANEOUS) ×2 IMPLANT
CHLORAPREP W/TINT 26 (MISCELLANEOUS) ×2 IMPLANT
CLIP APPLIE 9.375 MED OPEN (MISCELLANEOUS) IMPLANT
COVER BACK TABLE 60X90IN (DRAPES) ×2 IMPLANT
COVER MAYO STAND STRL (DRAPES) ×2 IMPLANT
DERMABOND ADVANCED .7 DNX12 (GAUZE/BANDAGES/DRESSINGS) ×2 IMPLANT
DRAPE LAPAROSCOPIC ABDOMINAL (DRAPES) IMPLANT
DRAPE LAPAROTOMY 100X72 PEDS (DRAPES) ×2 IMPLANT
DRAPE UTILITY XL STRL (DRAPES) ×2 IMPLANT
ELECT COATED BLADE 2.86 ST (ELECTRODE) ×2 IMPLANT
ELECT REM PT RETURN 9FT ADLT (ELECTROSURGICAL) ×2
ELECTRODE REM PT RTRN 9FT ADLT (ELECTROSURGICAL) ×2 IMPLANT
GLOVE BIOGEL PI IND STRL 8 (GLOVE) ×2 IMPLANT
GLOVE ECLIPSE 8.0 STRL XLNG CF (GLOVE) ×2 IMPLANT
GOWN STRL REUS W/ TWL LRG LVL3 (GOWN DISPOSABLE) ×4 IMPLANT
GOWN STRL REUS W/ TWL XL LVL3 (GOWN DISPOSABLE) ×2 IMPLANT
GOWN STRL REUS W/TWL LRG LVL3 (GOWN DISPOSABLE) ×2
GOWN STRL REUS W/TWL XL LVL3 (GOWN DISPOSABLE) ×2
HEMOSTAT ARISTA ABSORB 3G PWDR (HEMOSTASIS) IMPLANT
KIT MARKER MARGIN INK (KITS) IMPLANT
NDL HYPO 25X1 1.5 SAFETY (NEEDLE) ×2 IMPLANT
NEEDLE HYPO 25X1 1.5 SAFETY (NEEDLE) ×2 IMPLANT
NS IRRIG 1000ML POUR BTL (IV SOLUTION) ×2 IMPLANT
PACK BASIN DAY SURGERY FS (CUSTOM PROCEDURE TRAY) ×2 IMPLANT
PENCIL SMOKE EVACUATOR (MISCELLANEOUS) ×2 IMPLANT
SLEEVE SCD COMPRESS KNEE MED (STOCKING) ×2 IMPLANT
SPIKE FLUID TRANSFER (MISCELLANEOUS) ×2 IMPLANT
SPONGE T-LAP 4X18 ~~LOC~~+RFID (SPONGE) ×2 IMPLANT
STAPLER VISISTAT 35W (STAPLE) IMPLANT
SUT MNCRL AB 4-0 PS2 18 (SUTURE) IMPLANT
SUT MON AB 4-0 PC3 18 (SUTURE) ×2 IMPLANT
SUT SILK 2 0 SH (SUTURE) IMPLANT
SUT VICRYL 3-0 CR8 SH (SUTURE) ×2 IMPLANT
SYR CONTROL 10ML LL (SYRINGE) ×2 IMPLANT
TOWEL GREEN STERILE FF (TOWEL DISPOSABLE) ×4 IMPLANT
TRACER MAGTRACE VIAL (MISCELLANEOUS) IMPLANT
TRAY FAXITRON CT DISP (TRAY / TRAY PROCEDURE) IMPLANT
TUBE CONNECTING 20X1/4 (TUBING) ×2 IMPLANT
YANKAUER SUCT BULB TIP NO VENT (SUCTIONS) ×2 IMPLANT

## 2022-01-22 NOTE — H&P (Signed)
[name] is an 61 y.o. female.   Chief Complaint: Left breast cancer HPI: Patient returns for reexcision of left breast lumpectomy cavity.  Last month she had a seed localized left lumpectomy for atypical lobular hyperplasia that showed stage I left breast cancer.  She presents for reexcision lumpectomy as well as left axillary sentinel lymph node mapping.  Also has her history of thyroidectomy last month.  Past Medical History:  Diagnosis Date   Anxiety    Allergy induced asthma   Arthritis    Asthma    Crohn disease ([name])    Depression    GERD (gastroesophageal reflux disease)    History of methicillin resistant staphylococcus aureus (MRSA)    pt denies this.   Multiple thyroid nodules    Sleep apnea    CPAP   Thyroid cancer [name])     Past Surgical History:  Procedure Laterality Date   ANAL FISSURE REPAIR  2004   BREAST LUMPECTOMY WITH RADIOACTIVE SEED LOCALIZATION Left 01/01/2022   Procedure: LEFT BREAST SEED LUMPECTOMY;  Surgeon: Erroll Luna, MD;  Location: [facility];  Service: General;  Laterality: Left;   CESAREAN SECTION     COLONOSCOPY     INSERTION OF MESH N/A 07/26/2020   Procedure: INSERTION OF MESH;  Surgeon: Ralene Ok, MD;  Location: [facility];  Service: General;  Laterality: N/A;   LIGAMENT REPAIR Right 11/23/2015   Procedure: right index radial collateral LIGAMENT REPAIR;  Surgeon: Leanora Cover, MD;  Location: [facility] [place];  Service: Orthopedics;  Laterality: Right;   THYROIDECTOMY N/A 01/01/2022   Procedure: TOTAL THYROIDECTOMY WITH LIMITED LYMPH NODE DISSECTION;  Surgeon: Armandina Gemma, MD;  Location: [facility] [place];  Service: General;  Laterality: N/A;   UPPER ENDOSCOPY W/ ESOPHAGEAL MANOMETRY  10/2021   VENTRAL HERNIA REPAIR N/A 07/26/2020   Procedure: LAPAROSCOPIC VENTRAL HERNIA REPAIR WITH MESH;  Surgeon: Ralene Ok, MD;  Location: [facility];  Service: General;  Laterality: N/A;    Family History  Problem Relation Age of Onset   Alcohol  abuse Mother    Alcohol abuse Sister    Angioedema Neg Hx    Allergic rhinitis Neg Hx    Asthma Neg Hx    Atopy Neg Hx    Eczema Neg Hx    Immunodeficiency Neg Hx    Urticaria Neg Hx    Colon cancer Neg Hx    Stomach cancer Neg Hx    Esophageal cancer Neg Hx    Colon polyps Neg Hx    Social History:  reports that she has never smoked. She has never been exposed to tobacco smoke. She has never used smokeless tobacco. She reports current alcohol use of about 10.0 standard drinks of alcohol per week. She reports that she does not use drugs.  Allergies:  Allergies  Allergen Reactions   Chlorhexidine Gluconate Itching   Gabapentin Itching   Hydrocodone Itching   Oxycodone Hcl Itching    NDC UYQI:[id number]  NDC VFIE:[id number]   Betadine [Povidone Iodine] Rash    Facility-Administered Medications Prior to Admission  Medication Dose Route Frequency Provider Last Rate Last Admin   0.9 %  sodium chloride infusion  500 mL Intravenous Continuous Armbruster, Carlota Raspberry, MD       Medications Prior to Admission  Medication Sig Dispense Refill   acetaminophen (TYLENOL) 500 MG tablet Take 500-1,000 mg by mouth every 6 (six) hours as needed (pain).     Adalimumab (HUMIRA) 40 MG/0.4ML PSKT Inject 0.4 mLs (40 mg  total) under the skin every 14 (fourteen) days. 2 each 5   ascorbic acid (VITAMIN C) 500 MG tablet Take 500 mg by mouth 2 (two) times a week.     bimatoprost (LUMIGAN) 0.01 % SOLN Instill 1 drop into both eyes at bedtime 2.5 mL 10   busPIRone (BUSPAR) 7.5 MG tablet Take 1 tablet (7.5 mg total) by mouth 2 (two) times daily. 180 tablet 2   calcium carbonate (TUMS) 500 MG chewable tablet Chew 2 tablets (400 mg of elemental calcium total) by mouth 2 (two) times daily. 90 tablet 1   cetirizine (ZYRTEC) 10 MG tablet Take 10 mg by mouth in the morning.     DULoxetine (CYMBALTA) 60 MG capsule Take 1 capsule (60 mg total) by mouth daily. 90 capsule 1   fluticasone (FLONASE) 50 MCG/ACT nasal  spray Place 2 sprays into both nostrils daily. 16 g 1   levothyroxine (SYNTHROID) 50 MCG tablet Take 2 tablets (100 mcg total) by mouth daily before breakfast. 60 tablet 3   omeprazole (PRILOSEC) 40 MG capsule Take 1 capsule (40 mg total) by mouth daily. 90 capsule 3   Vitamin D, Ergocalciferol, (DRISDOL) 1.25 MG (50000 UNIT) CAPS capsule Take 1 capsule (50,000 Units total) by mouth every 7 (seven) days. 12 capsule 0   albuterol (VENTOLIN HFA) 108 (90 Base) MCG/ACT inhaler Inhale 2 puffs into the lungs every 6 (six) hours as needed for wheezing or shortness of breath. 8.5 g 3   celecoxib (CELEBREX) 100 MG capsule Take 1 capsule (100 mg total) by mouth in the morning and at bedtime. 60 capsule 11   fluocinonide ointment (LIDEX) 7.98 % Apply 1 application topically daily as needed (irritation).      No results found for this or any previous visit (from the past 48 hour(s)). No results found.  Review of Systems  All other systems reviewed and are negative.   Blood pressure 102/82, pulse 68, temperature (!) 97.5 F (36.4 C), temperature source Temporal, resp. rate 14, height '5\' 2"'$  (1.575 m), weight 67.9 kg, last menstrual period 01/10/2014, SpO2 97 %. Physical Exam HENT:     Head: Normocephalic.  Chest:  Breasts:    Right: Normal.       Comments: Breast incision healing Musculoskeletal:        General: Normal range of motion.  Lymphadenopathy:     Upper Body:     Right upper body: No supraclavicular or axillary adenopathy.     Left upper body: No supraclavicular or axillary adenopathy.  Neurological:     General: No focal deficit present.     Mental Status: She is alert.      Assessment/Plan Stage I left breast cancer upper outer quadrant  Reexcision left breast lumpectomy due to positive margins and left axillary sent lymph node mapping.Sentinel lymph node mapping and dissection has been discussed with the patient.  Risk of bleeding,  Infection,  Seroma formation,  Additional  procedures,,  Shoulder weakness ,  Shoulder stiffness,  Nerve and blood vessel injury and reaction to the mapping dyes have been discussed.  Alternatives to surgery have been discussed with the patient.  The patient agrees to proceed.   Turner Daniels, MD 01/22/2022, 1:32 PM

## 2022-01-22 NOTE — Anesthesia Preprocedure Evaluation (Signed)
Anesthesia Evaluation  Patient identified by MRN, date of birth, ID band Patient awake    Reviewed: Allergy & Precautions, H&P , NPO status , Patient's Chart, lab work & pertinent test results  Airway Mallampati: II  TM Distance: >3 FB Neck ROM: Full    Dental no notable dental hx.    Pulmonary sleep apnea and Continuous Positive Airway Pressure Ventilation    Pulmonary exam normal breath sounds clear to auscultation       Cardiovascular negative cardio ROS Normal cardiovascular exam Rhythm:Regular Rate:Normal     Neuro/Psych   Anxiety     negative neurological ROS     GI/Hepatic negative GI ROS, Neg liver ROS,,,Chrohns DZ   Endo/Other  Hypothyroidism    Renal/GU negative Renal ROS  negative genitourinary   Musculoskeletal negative musculoskeletal ROS (+)    Abdominal   Peds negative pediatric ROS (+)  Hematology negative hematology ROS (+)   Anesthesia Other Findings   Reproductive/Obstetrics negative OB ROS                             Anesthesia Physical Anesthesia Plan  ASA: 2  Anesthesia Plan: General   Post-op Pain Management: Regional block*   Induction: Intravenous  PONV Risk Score and Plan: 3 and Ondansetron, Dexamethasone, Midazolam and Treatment may vary due to age or medical condition  Airway Management Planned: LMA  Additional Equipment:   Intra-op Plan:   Post-operative Plan: Extubation in OR  Informed Consent: I have reviewed the patients History and Physical, chart, labs and discussed the procedure including the risks, benefits and alternatives for the proposed anesthesia with the patient or authorized representative who has indicated his/her understanding and acceptance.     Dental advisory given  Plan Discussed with: CRNA and Surgeon  Anesthesia Plan Comments:        Anesthesia Quick Evaluation

## 2022-01-22 NOTE — Transfer of Care (Signed)
Immediate Anesthesia Transfer of Care Note  Patient: [name]  Procedure(s) Performed: RE-EXCISION OF LEFT BREAST LUMPECTOMY (Left: Breast) LEFT SENTINEL NODE BIOPSY (Left: Axilla)  Patient Location: PACU  Anesthesia Type:GA combined with regional for post-op pain  Level of Consciousness: awake, alert , and oriented  Airway & Oxygen Therapy: Patient Spontanous Breathing and Patient connected to face mask oxygen  Post-op Assessment: Report given to RN and Post -op Vital signs reviewed and stable  Post vital signs: Reviewed and stable  Last Vitals:  Vitals Value Taken Time  BP 127/73 01/22/22 1524  Temp    Pulse 80 01/22/22 1525  Resp 15 01/22/22 1525  SpO2 100 % 01/22/22 1525  Vitals shown include unvalidated device data.  Last Pain:  Vitals:   01/22/22 1103  TempSrc: Temporal  PainSc: 0-No pain         Complications: No notable events documented.

## 2022-01-22 NOTE — Progress Notes (Signed)
Assisted Dr. Kalman Shan with left, pectoralis, ultrasound guided block. Side rails up, monitors on throughout procedure. See vital signs in flow sheet. Tolerated Procedure well.

## 2022-01-22 NOTE — Anesthesia Procedure Notes (Signed)
Anesthesia Regional Block: Pectoralis block   Pre-Anesthetic Checklist: , timeout performed,  Correct Patient, Correct Site, Correct Laterality,  Correct Procedure, Correct Position, site marked,  Risks and benefits discussed,  Surgical consent,  Pre-op evaluation,  At surgeon's request and post-op pain management  Laterality: Left  Prep: chloraprep       Needles:  Injection technique: Single-shot  Needle Type: Echogenic Needle     Needle Length: 9cm      Additional Needles:   Procedures:,,,, ultrasound used (permanent image in chart),,    Narrative:  Start time: 01/22/2022 1:00 PM End time: 01/22/2022 1:09 PM Injection made incrementally with aspirations every 5 mL.  Performed by: Personally  Anesthesiologist: Myrtie Soman, MD  Additional Notes: Patient tolerated the procedure well without complications

## 2022-01-22 NOTE — Discharge Instructions (Addendum)
[facility] Office Phone Number [phone]  BREAST BIOPSY/ PARTIAL MASTECTOMY: POST OP INSTRUCTIONS  Always review your discharge instruction sheet given to you by the facility where your surgery was performed.  IF YOU HAVE DISABILITY OR FAMILY LEAVE FORMS, YOU MUST BRING THEM TO THE OFFICE FOR PROCESSING.  DO NOT GIVE THEM TO YOUR DOCTOR.  A prescription for pain medication may be given to you upon discharge.  Take your pain medication as prescribed, if needed.  If narcotic pain medicine is not needed, then you may take acetaminophen (Tylenol) or ibuprofen (Advil) as needed. Take your usually prescribed medications unless otherwise directed If you need a refill on your pain medication, please contact your pharmacy.  They will contact our office to request authorization.  Prescriptions will not be filled after 5pm or on week-ends. You should eat very light the first 24 hours after surgery, such as soup, crackers, pudding, etc.  Resume your normal diet the day after surgery. Most patients will experience some swelling and bruising in the breast.  Ice packs and a good support bra will help.  Swelling and bruising can take several days to resolve.  It is common to experience some constipation if taking pain medication after surgery.  Increasing fluid intake and taking a stool softener will usually help or prevent this problem from occurring.  A mild laxative (Milk of Magnesia or Miralax) should be taken according to package directions if there are no bowel movements after 48 hours. Unless discharge instructions indicate otherwise, you may remove your bandages 24-48 hours after surgery, and you may shower at that time.  You may have steri-strips (small skin tapes) in place directly over the incision.  These strips should be left on the skin for 7-10 days.  If your surgeon used skin glue on the incision, you may shower in 24 hours.  The glue will flake off over the next 2-3 weeks.  Any  sutures or staples will be removed at the office during your follow-up visit. ACTIVITIES:  You may resume regular daily activities (gradually increasing) beginning the next day.  Wearing a good support bra or sports bra minimizes pain and swelling.  You may have sexual intercourse when it is comfortable. You may drive when you no longer are taking prescription pain medication, you can comfortably wear a seatbelt, and you can safely maneuver your car and apply brakes. RETURN TO WORK:  ______________________________________________________________________________________ You should see your doctor in the office for a follow-up appointment approximately two weeks after your surgery.  Your doctor's nurse will typically make your follow-up appointment when she calls you with your pathology report.  Expect your pathology report 2-3 business days after your surgery.  You may call to check if you do not hear from us after three days. OTHER INSTRUCTIONS: _______________________________________________________________________________________________ _____________________________________________________________________________________________________________________________________ _____________________________________________________________________________________________________________________________________ _____________________________________________________________________________________________________________________________________  WHEN TO CALL YOUR DOCTOR: Fever over 101.0 Nausea and/or vomiting. Extreme swelling or bruising. Continued bleeding from incision. Increased pain, redness, or drainage from the incision.  The clinic staff is available to answer your questions during regular business hours.  Please don't hesitate to call and ask to speak to one of the nurses for clinical concerns.  If you have a medical emergency, go to the nearest emergency room or call 911.  A surgeon from [facility] is always on call at the hospital.  For further questions, please visit [email or web address]    Post Anesthesia Home Care Instructions  Activity: Get plenty of rest for the remainder of   of the day. A responsible individual must stay with you for 24 hours following the procedure.  For the next 24 hours, DO NOT: -Drive a car -Paediatric nurse -Drink alcoholic beverages -Take any medication unless instructed by your physician -Make any legal decisions or sign important papers.  Meals: Start with liquid foods such as gelatin or soup. Progress to regular foods as tolerated. Avoid greasy, spicy, heavy foods. If nausea and/or vomiting occur, drink only clear liquids until the nausea and/or vomiting subsides. Call your physician if vomiting continues.  Special Instructions/Symptoms: Your throat may feel dry or sore from the anesthesia or the breathing tube placed in your throat during surgery. If this causes discomfort, gargle with warm salt water. The discomfort should disappear within 24 hours.  If you had a scopolamine patch placed behind your ear for the management of post- operative nausea and/or vomiting:  1. The medication in the patch is effective for 72 hours, after which it should be removed.  Wrap patch in a tissue and discard in the trash. Wash hands thoroughly with soap and water. 2. You may remove the patch earlier than 72 hours if you experience unpleasant side effects which may include dry mouth, dizziness or visual disturbances. 3. Avoid touching the patch. Wash your hands with soap and water after contact with the patch.    *May have Tylenol today after 5pm *May have Ibuprofen today after 9:30pm

## 2022-01-22 NOTE — Anesthesia Procedure Notes (Signed)
Anesthesia Procedure Image    

## 2022-01-22 NOTE — Interval H&P Note (Signed)
History and Physical Interval Note:  01/22/2022 1:34 PM  [name]  has presented today for surgery, with the diagnosis of [place] LEFT BREAST.  The various methods of treatment have been discussed with the patient and family. After consideration of risks, benefits and other options for treatment, the patient has consented to  Procedure(s): RE-EXCISION OF LEFT BREAST LUMPECTOMY (Left) LEFT SENTINEL NODE BIOPSY (Left) as a surgical intervention.  The patient's history has been reviewed, patient examined, no change in status, stable for surgery.  I have reviewed the patient's chart and labs.  Questions were answered to the patient's satisfaction.     Foraker

## 2022-01-22 NOTE — Anesthesia Procedure Notes (Signed)
Procedure Name: LMA Insertion Date/Time: 01/22/2022 2:09 PM  Performed by: Lavonia Dana, CRNAPre-anesthesia Checklist: Patient identified, Emergency Drugs available, Suction available and Patient being monitored Patient Re-evaluated:Patient Re-evaluated prior to induction Oxygen Delivery Method: Circle system utilized Preoxygenation: Pre-oxygenation with 100% oxygen Induction Type: IV induction Ventilation: Mask ventilation without difficulty LMA: LMA inserted LMA Size: 4.0 Number of attempts: 1 Airway Equipment and Method: Bite block Placement Confirmation: positive ETCO2 Tube secured with: Tape Dental Injury: Teeth and Oropharynx as per pre-operative assessment

## 2022-01-22 NOTE — Anesthesia Postprocedure Evaluation (Signed)
Anesthesia Post Note  Patient: [name]  Procedure(s) Performed: RE-EXCISION OF LEFT BREAST LUMPECTOMY (Left: Breast) LEFT SENTINEL NODE BIOPSY (Left: Axilla)     Patient location during evaluation: PACU Anesthesia Type: General Level of consciousness: awake and alert Pain management: pain level controlled Vital Signs Assessment: post-procedure vital signs reviewed and stable Respiratory status: spontaneous breathing, nonlabored ventilation, respiratory function stable and patient connected to nasal cannula oxygen Cardiovascular status: blood pressure returned to baseline and stable Postop Assessment: no apparent nausea or vomiting Anesthetic complications: no  No notable events documented.  Last Vitals:  Vitals:   01/22/22 1530 01/22/22 1545  BP: 128/67 127/69  Pulse: 77 69  Resp: 18 14  Temp:    SpO2: 100% 100%    Last Pain:  Vitals:   01/22/22 1524  TempSrc:   PainSc: 2                  Mikai Meints S

## 2022-01-22 NOTE — Op Note (Signed)
Preoperative diagnosis: Stage I left breast cancer  Postop diagnosis: Stage I left breast cancer upper outer quadrant  Procedure: Left breast reexcision lumpectomy and left axillary sentinel lymph node mapping with mag trace   Surgeon: Erroll Luna, MD  Anesthesia: LMA with 0.25% Marcaine plain with left pectoral block  Drains: None  Specimen: Left breast lumpectomy cavity margin sent separately after orientation with ink to left axillary sentinel nodes  Drains: None  EBL: Minimal  Indications for procedure: The patient is a 61 year old female who was found after seed localized left breast lumpectomy to have stage I left breast cancer.  Her 2 margins were involved and she presents today for excision of vulvar margins as well as left axillary sent lymph node mapping after reviewing treatment options with her.  Risk and benefits of the procedure were reviewed.  Treatment options were reviewed.  She has been seen by medical and radiation oncology as well.The procedure has been discussed with the patient. Alternatives to surgery have been discussed with the patient.  Risks of surgery include bleeding,  Infection,  Seroma formation, death,  and the need for further surgery.   The patient understands and wishes to proceed. Sentinel lymph node mapping and dissection has been discussed with the patient.  Risk of bleeding,  Infection,  Seroma formation,  Additional procedures,, lymphedema, shoulder weakness ,  Shoulder stiffness,  Nerve and blood vessel injury and reaction to the mapping dyes have been discussed.  Alternatives to surgery have been discussed with the patient.  The patient agrees to proceed.     Description of procedure: The patient was met in the holding area and questions answered.  She underwent pectoral block on the left.  Left side was marked as correct site.  She is taken back to the operating room.  She is placed supine upon the OR table.  After induction of general esthesia,  the left breast was prepped sterilely and 2 cc of MAG trace injected in the left subareolar plexus and massaged.  We then injected the previous circumareolar incision with local anesthetic.  It was opened and the seroma was evacuated.  We excised all margins and these were oriented with ink.  The cavity is irrigated.  We then infiltrated the area with local anesthetic.  We then closed the deep layer 3-0 Vicryl.  4 Monocryl was used to close the skin in subcuticular fashion.  MAC trace probe was used.  Hotspot identified left axilla.  A 4 cm incision was made along the inferior border of the axillary hairline.  Dissection was carried down to the level 1 contents.  There were 2 level 1 nodes removed.  The background counts approached baseline.  These were the only spikes of uptake noted in the left axilla.  The long thoracic nerve, thoracodorsal trunk and extra vein were all preserved.  Irrigation was used.  The deep layers were closed with 3-0 Vicryl.  4 Monocryl used to close the skin in a subcuticular fashion.  Dermabond applied.  Breast binder placed.  All counts found to be correct.  The patient was awoke extubated taken to recovery in satisfactory condition.

## 2022-01-23 ENCOUNTER — Other Ambulatory Visit (HOSPITAL_COMMUNITY): Payer: Self-pay

## 2022-01-23 ENCOUNTER — Encounter (HOSPITAL_BASED_OUTPATIENT_CLINIC_OR_DEPARTMENT_OTHER): Payer: Self-pay | Admitting: Surgery

## 2022-01-23 DIAGNOSIS — C50212 Malignant neoplasm of upper-inner quadrant of left female breast: Secondary | ICD-10-CM | POA: Diagnosis not present

## 2022-01-23 DIAGNOSIS — Z170 Estrogen receptor positive status [ER+]: Secondary | ICD-10-CM | POA: Diagnosis not present

## 2022-01-24 ENCOUNTER — Other Ambulatory Visit (HOSPITAL_COMMUNITY): Payer: Self-pay

## 2022-01-24 ENCOUNTER — Telehealth: Payer: Self-pay | Admitting: *Deleted

## 2022-01-24 LAB — SURGICAL PATHOLOGY

## 2022-01-24 NOTE — Telephone Encounter (Signed)
Received oncotype score of 31. Physician team notified.

## 2022-01-24 NOTE — Progress Notes (Signed)
Location of Breast Cancer:  Malignant neoplasm of upper-inner quadrant of left breast in female, estrogen receptor positive  Histology per Pathology Report:  01/22/2022 A. BREAST, LEFT ANTERIOR SUPERIOR MARGIN, RE-EXCISION:  - Negative for carcinoma in situ or invasive carcinoma  B. BREAST, LEFT MEDIAL INFERIOR MARGIN, RE-EXCISION:  - Negative for carcinoma in situ or invasive carcinoma  C. BREAST, LEFT POSTERIOR LATERAL MARGIN, RE-EXCISION:  - Negative for carcinoma in situ or invasive carcinoma  D. SENTINEL LYMPH NODE, LEFT AXILLARY, BIOPSY:  - Negative for carcinoma (0/1)   01/01/2022 A. 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 THYROID, TOTAL THYROIDECTOMY: - Papillary carcinoma of right lobe, classic and infiltrative follicular subtypes - Tumor measures 2.3 x 1.5 x 1.2 cm (pT2) - Tumor invades perithyroidal soft tissue - Margins free - Background multinodular goiter C. LYMPH NODE, CENTRAL COMPARTMENT ZONE VI, REGIONAL RESECTION: - Two benign lymph nodes, negative for carcinoma (0/2, pN0)   Receptor Status: ER(90%), PR (100%), Her2-neu (Negative via FISH), Ki-67(5%)  Did patient present with symptoms (if so, please note symptoms) or was this found on screening mammography?: Per Dr. Josetta Huddle H&P: patient "was noted on recent mammogram to have an area of density left breast "  Past/Anticipated interventions by surgeon, if any:  01/22/2022 --Dr. Marcello Moores [place] Left breast reexcision lumpectomy  Left axillary  sentinel lymph node mapping with mag trace   01/01/2022 --Dr. Marcello Moores Cornett Left breast seed localized lumpectomy  AND --Dr. Armandina Gemma Total thyroidectomy with limited central compartment lymph node dissection (Zone VI)   Past/Anticipated interventions by medical oncology, if any:  Under care of Dr. Nicholas Lose 01/25/2022 --Recommendation: Adjuvant chemotherapy with Taxotere and Cytoxan every 3 weeks x 4 Adjuvant radiation therapy Adjuvant antiestrogen therapy --Recommended participation in the neuropathy clinical trial --Return to clinic in 2 weeks.  Chemotherapy  Lymphedema issues, if any:  Denies    Pain issues, if any:  Reports some discomfort to axilla and left shoulder with lifting arm above head, but states both are manageable  SAFETY ISSUES: Prior radiation? No Pacemaker/ICD? No Possible current pregnancy? No--postmenopausal Is the patient on methotrexate? No  Current Complaints / other details:  Reports she had F/U with Dr. Brantley Stage this morning, otherwise nothing else of note

## 2022-01-24 NOTE — Telephone Encounter (Signed)
Spoke to pt regarding appt see Dr. Lindi Adie to discuss oncotype results. Scheduled and confirmed app on 1/12 at 12:45pm

## 2022-01-25 ENCOUNTER — Other Ambulatory Visit: Payer: Self-pay | Admitting: Hematology and Oncology

## 2022-01-25 ENCOUNTER — Other Ambulatory Visit: Payer: Self-pay

## 2022-01-25 ENCOUNTER — Other Ambulatory Visit (HOSPITAL_COMMUNITY): Payer: Self-pay

## 2022-01-25 ENCOUNTER — Inpatient Hospital Stay (HOSPITAL_BASED_OUTPATIENT_CLINIC_OR_DEPARTMENT_OTHER): Payer: 59 | Admitting: Hematology and Oncology

## 2022-01-25 VITALS — BP 152/85 | HR 70 | Temp 97.2°F | Resp 17 | Wt 154.6 lb

## 2022-01-25 DIAGNOSIS — Z5189 Encounter for other specified aftercare: Secondary | ICD-10-CM | POA: Diagnosis not present

## 2022-01-25 DIAGNOSIS — Z170 Estrogen receptor positive status [ER+]: Secondary | ICD-10-CM

## 2022-01-25 DIAGNOSIS — Z5111 Encounter for antineoplastic chemotherapy: Secondary | ICD-10-CM | POA: Diagnosis not present

## 2022-01-25 DIAGNOSIS — C50212 Malignant neoplasm of upper-inner quadrant of left female breast: Secondary | ICD-10-CM | POA: Diagnosis not present

## 2022-01-25 DIAGNOSIS — C73 Malignant neoplasm of thyroid gland: Secondary | ICD-10-CM | POA: Diagnosis not present

## 2022-01-25 MED ORDER — DEXAMETHASONE 4 MG PO TABS
4.0000 mg | ORAL_TABLET | Freq: Every day | ORAL | 0 refills | Status: DC
  Filled 2022-01-25: qty 10, 10d supply, fill #0

## 2022-01-25 MED ORDER — PROCHLORPERAZINE MALEATE 10 MG PO TABS
10.0000 mg | ORAL_TABLET | Freq: Four times a day (QID) | ORAL | 1 refills | Status: DC | PRN
  Filled 2022-01-25: qty 30, 8d supply, fill #0

## 2022-01-25 MED ORDER — ONDANSETRON HCL 8 MG PO TABS
8.0000 mg | ORAL_TABLET | Freq: Three times a day (TID) | ORAL | 1 refills | Status: DC | PRN
  Filled 2022-01-25: qty 30, 10d supply, fill #0

## 2022-01-25 MED ORDER — LIDOCAINE-PRILOCAINE 2.5-2.5 % EX CREA
TOPICAL_CREAM | CUTANEOUS | 3 refills | Status: DC
  Filled 2022-01-25: qty 30, 30d supply, fill #0

## 2022-01-25 NOTE — Progress Notes (Signed)
Patient Care Team: Elby Showers, MD as PCP - General (Internal Medicine) Mauro Kaufmann, RN as Oncology Nurse Navigator Rockwell Germany, RN as Oncology Nurse Navigator Nicholas Lose, MD as Consulting Physician (Hematology and Oncology)  DIAGNOSIS:  Encounter Diagnosis  Name Primary?   Malignant neoplasm of upper-inner quadrant of left breast in female, estrogen receptor positive ([place]) Yes    SUMMARY OF ONCOLOGIC HISTORY: Oncology History  Malignant neoplasm of upper-inner quadrant of left breast in female, estrogen receptor positive ([place])  01/01/2022 Surgery   Initial biopsy left breast: ALH Left lumpectomy: 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, lateral margin positive ER 90%, PR 100%, Ki-67 5%, HER2 equivocal by IHC 2+, FISH negative ratio 0.93 (Total thyroidectomy: Papillary carcinoma right lobe, classic and follicular subtype 2.3 cm) 0/2 lymph nodes)   01/16/2022 Cancer Staging   Staging form: Breast, AJCC 8th Edition - Clinical: Stage IA (cT1c, cN0, cM0, G2, ER+, PR+, HER2-) - Signed by Nicholas Lose, MD on 01/16/2022 Histologic grading system: 3 grade system   01/22/2022 Surgery   Margin reexcision surgery: Benign margins, sentinel lymph node 0/1   01/23/2022 Oncotype testing   Recurrence score: 31 (19% distant recurrence risk at 9 years)   02/08/2022 -  Chemotherapy   Patient is on Treatment Plan : BREAST TC q21d       CHIEF COMPLIANT: Follow-up to discuss results of Oncotype DX test  INTERVAL HISTORY: [name] is a 62 year old with above-mentioned history of estrogen receptor positive breast cancer who underwent Oncotype DX testing and is here today to discuss results.  She recently had a lymph node surgery and is healing and recovering from that surgery.  Margins were also removed and they were all clear.   ALLERGIES:  is allergic to chlorhexidine gluconate, gabapentin, hydrocodone, oxycodone hcl, and betadine [povidone iodine].  MEDICATIONS:   Current Outpatient Medications  Medication Sig Dispense Refill   acetaminophen (TYLENOL) 500 MG tablet Take 500-1,000 mg by mouth every 6 (six) hours as needed (pain).     Adalimumab (HUMIRA) 40 MG/0.4ML PSKT Inject 0.4 mLs (40 mg total) under the skin every 14 (fourteen) days. 2 each 5   albuterol (VENTOLIN HFA) 108 (90 Base) MCG/ACT inhaler Inhale 2 puffs into the lungs every 6 (six) hours as needed for wheezing or shortness of breath. 8.5 g 3   ascorbic acid (VITAMIN C) 500 MG tablet Take 500 mg by mouth 2 (two) times a week.     bimatoprost (LUMIGAN) 0.01 % SOLN Instill 1 drop into both eyes at bedtime 2.5 mL 10   busPIRone (BUSPAR) 7.5 MG tablet Take 1 tablet (7.5 mg total) by mouth 2 (two) times daily. 180 tablet 2   calcium carbonate (TUMS) 500 MG chewable tablet Chew 2 tablets (400 mg of elemental calcium total) by mouth 2 (two) times daily. 90 tablet 1   celecoxib (CELEBREX) 100 MG capsule Take 1 capsule (100 mg total) by mouth in the morning and at bedtime. 60 capsule 11   cetirizine (ZYRTEC) 10 MG tablet Take 10 mg by mouth in the morning.     DULoxetine (CYMBALTA) 60 MG capsule Take 1 capsule (60 mg total) by mouth daily. 90 capsule 1   fluocinonide ointment (LIDEX) 5.92 % Apply 1 application topically daily as needed (irritation).     fluticasone (FLONASE) 50 MCG/ACT nasal spray Place 2 sprays into both nostrils daily. 16 g 1   ibuprofen (ADVIL) 800 MG tablet Take 1 tablet (800 mg  total) by mouth every 8 (eight) hours as needed. 30 tablet 0   levothyroxine (SYNTHROID) 50 MCG tablet Take 2 tablets (100 mcg total) by mouth daily before breakfast. 60 tablet 3   omeprazole (PRILOSEC) 40 MG capsule Take 1 capsule (40 mg total) by mouth daily. 90 capsule 3   oxyCODONE (OXY IR/ROXICODONE) 5 MG immediate release tablet Take 1 tablet (5 mg total) by mouth every 6 (six) hours as needed for severe pain. 15 tablet 0   Vitamin D, Ergocalciferol, (DRISDOL) 1.25 MG (50000 UNIT) CAPS capsule Take 1  capsule (50,000 Units total) by mouth every 7 (seven) days. 12 capsule 0   Current Facility-Administered Medications  Medication Dose Route Frequency Provider Last Rate Last Admin   0.9 %  sodium chloride infusion  500 mL Intravenous Continuous Armbruster, Carlota Raspberry, MD        PHYSICAL EXAMINATION: ECOG PERFORMANCE STATUS: 1 - Symptomatic but completely ambulatory  Vitals:   01/25/22 1252  BP: (!) 152/85  Pulse: 70  Resp: 17  Temp: (!) 97.2 F (36.2 C)  SpO2: 100%   Filed Weights   01/25/22 1252  Weight: 154 lb 9 oz (70.1 kg)      LABORATORY DATA:  I have reviewed the data as listed    Latest Ref Rng & Units 01/02/2022    2:27 AM 08/30/2021    9:39 AM 06/15/2020    9:34 AM  CMP  Glucose 70 - 99 mg/dL 139  86    BUN 8 - 23 mg/dL 6  13    Creatinine 0.44 - 1.00 mg/dL 0.59  0.74    Sodium 135 - 145 mmol/L 135  140    Potassium 3.5 - 5.1 mmol/L 3.9  4.4    Chloride 98 - 111 mmol/L 104  105    CO2 22 - 32 mmol/L 22  29    Calcium 8.9 - 10.3 mg/dL 8.8  9.8    Total Protein 6.1 - 8.1 g/dL  7.4  7.5   Total Bilirubin 0.2 - 1.2 mg/dL  0.8  0.7   AST 10 - 35 U/L  24  21   ALT 6 - 29 U/L  18  16     Lab Results  Component Value Date   WBC 5.8 12/25/2021   HGB 12.4 12/25/2021   HCT 38.1 12/25/2021   MCV 93.8 12/25/2021   PLT 270 12/25/2021   NEUTROABS 2,867 08/30/2021    ASSESSMENT & PLAN:  Malignant neoplasm of upper-inner quadrant of left breast in female, estrogen receptor positive ([place]) 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) 0/2 lymph nodes) 01/22/2022: Margin reexcision: Benign, 0/1 sentinel lymph node negative 01/23/2022: Oncotype DX recurrence score 31 (19% risk of distant recurrence at 9  years)  Recommendation: Adjuvant chemotherapy with Taxotere and Cytoxan every 3 weeks x 4 Adjuvant radiation therapy Adjuvant antiestrogen therapy  Chemotherapy Counseling: I discussed the risks and benefits of chemotherapy including the risks of nausea/ vomiting, risk of infection from low WBC count, fatigue due to chemo or anemia, bruising or bleeding due to low platelets, mouth sores, loss/ change in taste and decreased appetite. Liver and kidney function will be monitored through out chemotherapy as abnormalities in liver and kidney function may be a side effect of  treatment.  Neuropathy risk from Taxotere was discussed in detail. Risk of permanent bone marrow dysfunction and leukemia due to chemo were also discussed.  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  Chemotherapy.    No orders of the defined types were placed in this encounter.  The patient has a good understanding of the overall plan. she agrees with it. she will call with any problems that may develop before the next visit here. Total time spent: 30 mins including face to face time and time spent for planning, charting and co-ordination of care   Harriette Ohara, MD 01/25/22

## 2022-01-25 NOTE — Progress Notes (Signed)
START ON PATHWAY REGIMEN - Breast     A cycle is every 21 days:     Docetaxel      Cyclophosphamide   **Always confirm dose/schedule in your pharmacy ordering system**  Patient Characteristics: Postoperative without Neoadjuvant Therapy (Pathologic Staging), Invasive Disease, Adjuvant Therapy, HER2 Negative, ER Positive, Node Negative, pT1a, pN4m or pT1b-c, pN0/N189mor pT2 or Higher, pN0, Oncotype High Risk (? 26) Therapeutic Status: Postoperative without Neoadjuvant Therapy (Pathologic Staging) AJCC Grade: G2 AJCC N Category: pN0 AJCC M Category: cM0 ER Status: Positive (+) AJCC 8 Stage Grouping: IA HER2 Status: Negative (-) Oncotype Dx Recurrence Score: 31 AJCC T Category: pT1c PR Status: Positive (+) Has this patient completed genomic testing<= Yes - Oncotype DX(R) Intent of Therapy: Curative Intent, Discussed with Patient

## 2022-01-25 NOTE — Assessment & Plan Note (Addendum)
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) 0/2 lymph nodes) 01/22/2022: Margin reexcision: Benign, 0/1 sentinel lymph node negative 01/23/2022: Oncotype DX recurrence score 31 (19% risk of distant recurrence at 9 years)  Recommendation: Adjuvant chemotherapy with Taxotere and Cytoxan every 3 weeks x 4 Adjuvant radiation therapy Adjuvant antiestrogen therapy  Chemotherapy Counseling: I discussed the risks and benefits of chemotherapy including the risks of nausea/ vomiting, risk of infection from low WBC count, fatigue due to chemo or anemia, bruising or bleeding due to low platelets, mouth sores, loss/ change in taste and decreased appetite. Liver and kidney function will be monitored through out chemotherapy as abnormalities in liver and kidney function may be a side effect of treatment.  Neuropathy risk from Taxotere was discussed in detail. Risk of permanent bone marrow dysfunction and leukemia due to chemo were also discussed.  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  Chemotherapy.

## 2022-01-26 ENCOUNTER — Other Ambulatory Visit (HOSPITAL_COMMUNITY): Payer: Self-pay

## 2022-01-26 ENCOUNTER — Other Ambulatory Visit: Payer: Self-pay

## 2022-01-28 ENCOUNTER — Encounter: Payer: Self-pay | Admitting: *Deleted

## 2022-01-28 ENCOUNTER — Encounter: Payer: Self-pay | Admitting: Hematology and Oncology

## 2022-01-28 ENCOUNTER — Other Ambulatory Visit (HOSPITAL_COMMUNITY): Payer: Self-pay

## 2022-01-28 ENCOUNTER — Other Ambulatory Visit: Payer: Self-pay

## 2022-01-28 NOTE — Telephone Encounter (Signed)
Pt had called earlier with message to call her about port questions.  Returned call & received VM.  Left message to call back tomorrow.

## 2022-01-29 ENCOUNTER — Encounter (HOSPITAL_COMMUNITY): Payer: Self-pay

## 2022-01-29 ENCOUNTER — Other Ambulatory Visit (HOSPITAL_COMMUNITY): Payer: Self-pay

## 2022-01-29 ENCOUNTER — Telehealth: Payer: Self-pay | Admitting: *Deleted

## 2022-01-29 ENCOUNTER — Ambulatory Visit: Payer: Self-pay | Admitting: Surgery

## 2022-01-29 DIAGNOSIS — C73 Malignant neoplasm of thyroid gland: Secondary | ICD-10-CM | POA: Diagnosis not present

## 2022-01-29 DIAGNOSIS — E042 Nontoxic multinodular goiter: Secondary | ICD-10-CM | POA: Diagnosis not present

## 2022-01-29 NOTE — Progress Notes (Signed)
Radiation Oncology         [phone] ________________________________  Initial Outpatient Consultation  Name: [name] MRN: [id number]  Date: 01/30/2022  DOB: [birth year]  FU:XNATFT, Cresenciano Lick, MD  Erroll Luna, MD   REFERRING PHYSICIAN: Erroll Luna, MD  DIAGNOSIS: The encounter diagnosis was Malignant neoplasm of upper-inner quadrant of left breast in female, estrogen receptor positive ([place]).  Stage IA (cT1c, cN0, cM0) Left Breast UIQ, Invasive ductal adenocarcinoma, ER+ / PR+ / Her2-, Grade 2 : s/p lumpectomy   Concurrent diagnosis of thyroid cancer : s/p total thyroidectomy   HISTORY OF PRESENT ILLNESS::[name] is a 62 y.o. female who is accompanied by her physician husband. she is seen as a courtesy of Dr. Brantley Stage for an opinion concerning radiation therapy as part of management for her recently diagnosed left breast cancer.   The patient presented for a routine screening mammogram on 11/12/21 which showed a possible abnormality in the left breast. Left breast diagnostic mammogram performed at [facility] on 11/23/21 revealed an architectural distortion in the left breast spanning approximately 1.5 x 1.7 x 1.2 cm, located central to the left nipple at a middle depth. Ultrasound of the left breast performed on that same date showed a possible abnormality in the 10 o'clock left breast 4 cmfn, possibly correlating with mammographic findings. No abnormal left axillary lymph nodes were appreciated.   Biopsy of the left breast abnormality on 11/28/21 showed no evidence of malignancy, with focal atypical lobular hyperplasia, fibrocystic changes, microcalcifications, and focal usual ductal hyperplasia.   Accordingly, the patient was referred to Dr. Brantley Stage who recommended proceeding with a lumpectomy. The patient was already scheduled for a thyroidectomy at this time management of thyroid cancer. Given so, Dr. Brantley Stage arranged for her to have her lumpectomy performed at the time  of her thyroidectomy.   Pathology from left breast lumpectomy performed on 01/01/22 revealed: grade 2 invasive ductal adenocarcinoma (tumor the size of 1.5 x 1.2 x 0.7 cm), with focal intermediate grade DCIS and focal ALH; lateral margin positive for invasive disease; all margins negative for in situ disease. Prognostic indicators significant for: estrogen receptor 90% positive with moderate staining intensity; progesterone receptor 100% positive with strong staining intensity; Proliferation marker Ki67 at 5%; Her2 status negative; Grade 2.   (Pathology from total thyroidectomy showed papillary carcinoma of the right lobe with invasion of the perithyroidal soft tissue. 2 central compartment zone VI lymph nodes were also resected and showed benign findings).   Oncotype DX was obtained on the final surgical sample and the recurrence score of 31 predicted a risk of recurrence outside the breast over the next 9 years of 19%, if the patient's only systemic therapy were to be an antiestrogen for 5 years. It also predicted a significant benefit from chemotherapy.  Accordingly, the patient underwent re-excision on 01/22/22. Final pathology showed all final margins as negative for carcinoma. A left axillary sentinel lymph node was also biopsied and showed no evidence of carcinoma.   Based on her Oncotype results, Dr. Lindi Adie has recommended adjuvant chemotherapy consisting of Taxotere and Cytoxan x 4 cycles. Start date is TBD at this time.   In regards to her thyroid cancer, the patient is followed by Dr. Cruzita Lederer at [facility]. Dr. Cruzita Lederer has recommended radioactive iodine for post-op thyroid remnant ablation given that her tumor was greater than 1.5 cm on final pathology. She is also on levothyroxine for iatrogenic hypothyroidism. Additionally, she followed by Dr. Valeta Harms for a RLL nodule which  is favored to be infectious/inflammatory in etiology. This will be followed via surveillance imaging every 6  months.   PREVIOUS RADIATION THERAPY: No  PAST MEDICAL HISTORY:  Past Medical History:  Diagnosis Date   Anxiety    Allergy induced asthma   Arthritis    Asthma    Crohn disease ([place])    Depression    GERD (gastroesophageal reflux disease)    History of methicillin resistant staphylococcus aureus (MRSA)    pt denies this.   Multiple thyroid nodules    Sleep apnea    CPAP   Thyroid cancer ([place])     PAST SURGICAL HISTORY: Past Surgical History:  Procedure Laterality Date   ANAL FISSURE REPAIR  2004   BREAST LUMPECTOMY WITH RADIOACTIVE SEED LOCALIZATION Left 01/01/2022   Procedure: LEFT BREAST SEED LUMPECTOMY;  Surgeon: Erroll Luna, MD;  Location: [place];  Service: General;  Laterality: Left;   CESAREAN SECTION     COLONOSCOPY     INSERTION OF MESH N/A 07/26/2020   Procedure: INSERTION OF MESH;  Surgeon: Ralene Ok, MD;  Location: [place];  Service: General;  Laterality: N/A;   LIGAMENT REPAIR Right 11/23/2015   Procedure: right index radial collateral LIGAMENT REPAIR;  Surgeon: Leanora Cover, MD;  Location: [place];  Service: Orthopedics;  Laterality: Right;   RE-EXCISION OF BREAST LUMPECTOMY Left 01/22/2022   Procedure: RE-EXCISION OF LEFT BREAST LUMPECTOMY;  Surgeon: Erroll Luna, MD;  Location: [place];  Service: General;  Laterality: Left;   SENTINEL NODE BIOPSY Left 01/22/2022   Procedure: LEFT SENTINEL NODE BIOPSY;  Surgeon: Erroll Luna, MD;  Location: [place];  Service: General;  Laterality: Left;   THYROIDECTOMY N/A 01/01/2022   Procedure: TOTAL THYROIDECTOMY WITH LIMITED LYMPH NODE DISSECTION;  Surgeon: Armandina Gemma, MD;  Location: [place];  Service: General;  Laterality: N/A;   UPPER ENDOSCOPY W/ ESOPHAGEAL MANOMETRY  10/2021   VENTRAL HERNIA REPAIR N/A 07/26/2020   Procedure: LAPAROSCOPIC VENTRAL HERNIA REPAIR WITH MESH;  Surgeon: Ralene Ok, MD;  Location: [place];  Service: General;  Laterality: N/A;     FAMILY HISTORY:  Family History  Problem Relation Age of Onset   Alcohol abuse Mother    Alcohol abuse Sister    Angioedema Neg Hx    Allergic rhinitis Neg Hx    Asthma Neg Hx    Atopy Neg Hx    Eczema Neg Hx    Immunodeficiency Neg Hx    Urticaria Neg Hx    Colon cancer Neg Hx    Stomach cancer Neg Hx    Esophageal cancer Neg Hx    Colon polyps Neg Hx     SOCIAL HISTORY:  Social History   Tobacco Use   Smoking status: Never    Passive exposure: Never   Smokeless tobacco: Never  Vaping Use   Vaping Use: Never used  Substance Use Topics   Alcohol use: Yes    Alcohol/week: 10.0 standard drinks of alcohol    Types: 10 Glasses of wine per week    Comment: 10 glasses/week   Drug use: No    ALLERGIES:  Allergies  Allergen Reactions   Chlorhexidine Gluconate Itching   Gabapentin Itching   Hydrocodone Itching   Oxycodone Hcl Itching    NDC YKDX:[id number]  NDC QBHA:[id number]   Betadine [Povidone Iodine] Rash    MEDICATIONS:  Current Outpatient Medications  Medication Sig Dispense Refill   acetaminophen (TYLENOL) 500 MG tablet Take 500-1,000 mg by  mouth every 6 (six) hours as needed (pain).     Adalimumab (HUMIRA, 2 SYRINGE,) 40 MG/0.4ML PSKT Inject 0.4 mLs (40 mg total) under the skin every 14 (fourteen) days. 2 each 5   albuterol (VENTOLIN HFA) 108 (90 Base) MCG/ACT inhaler Inhale 2 puffs into the lungs every 6 (six) hours as needed for wheezing or shortness of breath. 8.5 g 3   ascorbic acid (VITAMIN C) 500 MG tablet Take 500 mg by mouth 2 (two) times a week.     bimatoprost (LUMIGAN) 0.01 % SOLN Instill 1 drop into both eyes at bedtime 2.5 mL 10   busPIRone (BUSPAR) 7.5 MG tablet Take 1 tablet (7.5 mg total) by mouth 2 (two) times daily. 180 tablet 2   calcium carbonate (TUMS) 500 MG chewable tablet Chew 2 tablets (400 mg of elemental calcium total) by mouth 2 (two) times daily. 90 tablet 1   celecoxib (CELEBREX) 100 MG capsule Take 1 capsule (100 mg  total) by mouth in the morning and at bedtime. 60 capsule 11   cetirizine (ZYRTEC) 10 MG tablet Take 10 mg by mouth in the morning.     dexamethasone (DECADRON) 4 MG tablet Take 1 tablet day before chemo and 1 tablet day after chemo with food. 10 tablet 0   DULoxetine (CYMBALTA) 60 MG capsule Take 1 capsule (60 mg total) by mouth daily. 90 capsule 1   fluocinonide ointment (LIDEX) 4.81 % Apply 1 application topically daily as needed (irritation).     fluticasone (FLONASE) 50 MCG/ACT nasal spray Place 2 sprays into both nostrils daily. 16 g 1   ibuprofen (ADVIL) 800 MG tablet Take 1 tablet (800 mg total) by mouth every 8 (eight) hours as needed. 30 tablet 0   levothyroxine (SYNTHROID) 50 MCG tablet Take 2 tablets (100 mcg total) by mouth daily before breakfast. 60 tablet 3   lidocaine-prilocaine (EMLA) cream Apply to affected area once 30 g 3   omeprazole (PRILOSEC) 40 MG capsule Take 1 capsule (40 mg total) by mouth daily. 90 capsule 3   ondansetron (ZOFRAN) 8 MG tablet Take 1 tablet (8 mg total) by mouth every 8 (eight) hours as needed for nausea or vomiting. Start on the third day after chemotherapy. 30 tablet 1   oxyCODONE (OXY IR/ROXICODONE) 5 MG immediate release tablet Take 1 tablet (5 mg total) by mouth every 6 (six) hours as needed for severe pain. 15 tablet 0   prochlorperazine (COMPAZINE) 10 MG tablet Take 1 tablet (10 mg total) by mouth every 6 (six) hours as needed for nausea or vomiting. 30 tablet 1   Vitamin D, Ergocalciferol, (DRISDOL) 1.25 MG (50000 UNIT) CAPS capsule Take 1 capsule (50,000 Units total) by mouth every 7 (seven) days. 12 capsule 0   Current Facility-Administered Medications  Medication Dose Route Frequency Provider Last Rate Last Admin   0.9 %  sodium chloride infusion  500 mL Intravenous Continuous Armbruster, Carlota Raspberry, MD        REVIEW OF SYSTEMS:  A 10+ POINT REVIEW OF SYSTEMS WAS OBTAINED including neurology, dermatology, psychiatry, cardiac, respiratory,  lymph, extremities, GI, GU, musculoskeletal, constitutional, reproductive, HEENT.  She reports some discomfort in the breast but no actual pain.  She does report some arthritic type symptoms in her left shoulder we will make referral to physical therapy to help address this issue prior to initiation of radiation therapy.   PHYSICAL EXAM:  height is '5\' 2"'$  (1.575 m) and weight is 152 lb (68.9 kg). Her temperature is  97.8 F (36.6 C). Her blood pressure is 113/60 and her pulse is 82. Her respiration is 18 and oxygen saturation is 100%.   General: Alert and oriented, in no acute distress HEENT: Head is normocephalic. Extraocular movements are intact. Neck: Neck is supple, no palpable cervical or supraclavicular lymphadenopathy. Heart: Regular in rate and rhythm with no murmurs, rubs, or gallops. Chest: Clear to auscultation bilaterally, with no rhonchi, wheezes, or rales. Abdomen: Soft, nontender, nondistended, with no rigidity or guarding. Extremities: No cyanosis or edema. Lymphatics: see Neck Exam Skin: No concerning lesions. Musculoskeletal: symmetric strength and muscle tone throughout. Neurologic: Cranial nerves II through XII are grossly intact. No obvious focalities. Speech is fluent. Coordination is intact. Psychiatric: Judgment and insight are intact. Affect is appropriate.  Right Breast: no palpable mass, nipple discharge or bleeding. Left Breast: Well-healing periareolar scar.  She also has separate scar in the axillary region which is also healing very well.  No signs of infection within the breast.  ECOG = 1    LABORATORY DATA:  Lab Results  Component Value Date   WBC 5.8 12/25/2021   HGB 12.4 12/25/2021   HCT 38.1 12/25/2021   MCV 93.8 12/25/2021   PLT 270 12/25/2021   NEUTROABS 2,867 08/30/2021   Lab Results  Component Value Date   NA 135 01/02/2022   K 3.9 01/02/2022   CL 104 01/02/2022   CO2 22 01/02/2022   GLUCOSE 139 (H) 01/02/2022   BUN 6 (L) 01/02/2022    CREATININE 0.59 01/02/2022   CALCIUM 8.8 (L) 01/02/2022      RADIOGRAPHY: No results found.    IMPRESSION: Stage IA (cT1c, cN0, cM0) Left Breast UIQ, Invasive ductal adenocarcinoma, ER+ / PR+ / Her2-, Grade 2 : s/p lumpectomy   She would be a good candidate for adjuvant radiation therapy as breast conserving therapy.  Today, I talked to the patient and her husband about the findings and work-up thus far.  We discussed the natural history of invasive ductal carcinoma of the left breast and general treatment, highlighting the role of radiotherapy in the management.  We discussed the available radiation techniques, and focused on the details of logistics and delivery.  We reviewed the anticipated acute and late sequelae associated with radiation in this setting.  The patient was encouraged to ask questions that I answered to the best of my ability.   PLAN: She will proceed with adjuvant chemotherapy  consisting of Taxotere and Cytoxan for 4 cycles.  After chemotherapy is completed she will then be referred back to radiation oncology for initiation of her left breast radiation therapy.  She would be a good candidate for hypofractionated accelerated radiation therapy over approximately 4 weeks.  Will use cardiac sparing techniques if necessary.   60 minutes of total time was spent for this patient encounter, including preparation, face-to-face counseling with the patient and coordination of care, physical exam, and documentation of the encounter.   ------------------------------------------------  [name], PhD, MD  This document serves as a record of services personally performed by Gery Pray, MD. It was created on his behalf by Roney Mans, a trained medical scribe. The creation of this record is based on the scribe's personal observations and the provider's statements to them. This document has been checked and approved by the attending provider.

## 2022-01-29 NOTE — Telephone Encounter (Signed)
[name] contacted office to ask about insertion and use of port-a-cath for upcoming planned chemotherapy. Dr. Lindi Adie had discussed with her and recommended it in her  appt last week. She asked if there were advantages to a port, could chemotherapy be given through a regular IV and what was the process?  Advised her that ports are recommended for several reasons: chemotherapy can irritate peripheral veins as they are smaller than the larger vein where a port is placed; a peripheral IV site must be established for each treatment; blood must be drawn via a peripheral site for all lab tests ordered during treatment and lab tests are ordered with each treatment - sometimes more frequently.  Informed her that the port would be placed by MD either in surgery or interventional radiology. The port is placed in a pocket between the skin and muscle and is closed with a surgical adhesive. The scar is small once healed.  She asked when this would happen. Advised her that the Nurse Navigators will assist with scheduling the procedure, following Dr. Geralyn Flash orders. Encouraged her to ask questions through out process. [name] expressed appreciation for information.

## 2022-01-30 ENCOUNTER — Other Ambulatory Visit: Payer: Self-pay | Admitting: Hematology and Oncology

## 2022-01-30 ENCOUNTER — Other Ambulatory Visit: Payer: Self-pay

## 2022-01-30 ENCOUNTER — Encounter: Payer: Self-pay | Admitting: Radiation Oncology

## 2022-01-30 ENCOUNTER — Encounter: Payer: Self-pay | Admitting: Physical Therapy

## 2022-01-30 ENCOUNTER — Encounter: Payer: Self-pay | Admitting: *Deleted

## 2022-01-30 ENCOUNTER — Ambulatory Visit
Admission: RE | Admit: 2022-01-30 | Discharge: 2022-01-30 | Disposition: A | Discharge status: Home / Self Care | Payer: 59 | Source: Ambulatory Visit | Attending: Radiation Oncology | Admitting: Radiation Oncology

## 2022-01-30 ENCOUNTER — Telehealth: Payer: Self-pay | Admitting: Emergency Medicine

## 2022-01-30 VITALS — BP 113/60 | HR 82 | Temp 97.8°F | Resp 18 | Ht 62.0 in | Wt 152.0 lb

## 2022-01-30 DIAGNOSIS — Z79899 Other long term (current) drug therapy: Secondary | ICD-10-CM | POA: Diagnosis not present

## 2022-01-30 DIAGNOSIS — K5090 Crohn's disease, unspecified, without complications: Secondary | ICD-10-CM | POA: Insufficient documentation

## 2022-01-30 DIAGNOSIS — Z8614 Personal history of Methicillin resistant Staphylococcus aureus infection: Secondary | ICD-10-CM | POA: Diagnosis not present

## 2022-01-30 DIAGNOSIS — Z79890 Hormone replacement therapy (postmenopausal): Secondary | ICD-10-CM | POA: Diagnosis not present

## 2022-01-30 DIAGNOSIS — E032 Hypothyroidism due to medicaments and other exogenous substances: Secondary | ICD-10-CM | POA: Insufficient documentation

## 2022-01-30 DIAGNOSIS — C50212 Malignant neoplasm of upper-inner quadrant of left female breast: Secondary | ICD-10-CM | POA: Diagnosis not present

## 2022-01-30 DIAGNOSIS — Z85850 Personal history of malignant neoplasm of thyroid: Secondary | ICD-10-CM | POA: Diagnosis not present

## 2022-01-30 DIAGNOSIS — G4730 Sleep apnea, unspecified: Secondary | ICD-10-CM | POA: Insufficient documentation

## 2022-01-30 DIAGNOSIS — K219 Gastro-esophageal reflux disease without esophagitis: Secondary | ICD-10-CM | POA: Insufficient documentation

## 2022-01-30 DIAGNOSIS — Z170 Estrogen receptor positive status [ER+]: Secondary | ICD-10-CM | POA: Diagnosis not present

## 2022-01-30 NOTE — Telephone Encounter (Signed)
S2205, ICE COMPRESS: RANDOMIZED TRIAL OF LIMB CRYOCOMPRESSION VERSUS CONTINUOUS COMPRESSION VERSUS LOW CYCLIC COMPRESSION FOR THE PREVENTION  OF TAXANE-INDUCED PERIPHERAL NEUROPATHY  Called patient to speak with her about research study, no answer.  No ROI on file.  Left VM requesting call back with contact information.  Wells Guiles 'Learta Codding' Neysa Bonito, RN, BSN, [facility] Clinical Research Nurse I 01/30/22 10:39 AM

## 2022-01-31 ENCOUNTER — Telehealth: Payer: Self-pay | Admitting: Emergency Medicine

## 2022-01-31 ENCOUNTER — Encounter (HOSPITAL_COMMUNITY): Payer: Self-pay

## 2022-01-31 NOTE — Telephone Encounter (Signed)
S2205, ICE COMPRESS: RANDOMIZED TRIAL OF LIMB CRYOCOMPRESSION VERSUS CONTINUOUS COMPRESSION VERSUS LOW CYCLIC COMPRESSION FOR THE PREVENTION  OF TAXANE-INDUCED PERIPHERAL NEUROPATHY  Patient returned call.  Discussed ineligibility for trial based on current chemo treatment regimen.  Patient verbalized understanding, denies any questions or concerns at this time.  Wells Guiles 'Learta Codding' Neysa Bonito, RN, BSN, [facility] Clinical Research Nurse I 01/31/22 3:56 PM

## 2022-02-01 ENCOUNTER — Other Ambulatory Visit (INDEPENDENT_AMBULATORY_CARE_PROVIDER_SITE_OTHER): Payer: 59

## 2022-02-01 DIAGNOSIS — E890 Postprocedural hypothyroidism: Secondary | ICD-10-CM

## 2022-02-01 NOTE — Addendum Note (Signed)
Addended by: Nils Flack I on: 02/01/2022 02:57 PM   Modules accepted: Orders

## 2022-02-02 LAB — TSH: TSH: 1.77 mIU/L (ref 0.40–4.50)

## 2022-02-02 LAB — T4, FREE: Free T4: 1.5 ng/dL (ref 0.8–1.8)

## 2022-02-03 NOTE — Therapy (Signed)
OUTPATIENT PHYSICAL THERAPY BREAST CANCER POST OP FOLLOW UP   Patient Name: [name] MRN: [id number] DOB:[birth year], 62 y.o., female Today's Date: 02/04/2022  END OF SESSION:  PT End of Session - 02/04/22 1419     Visit Number 2    Number of Visits 2    PT Start Time 1410    PT Stop Time 1515    PT Time Calculation (min) 65 min    Activity Tolerance Patient tolerated treatment well    Behavior During Therapy WFL for tasks assessed/performed             Past Medical History:  Diagnosis Date   Anxiety    Allergy induced asthma   Arthritis    Asthma    Crohn disease ([place])    Depression    GERD (gastroesophageal reflux disease)    History of methicillin resistant staphylococcus aureus (MRSA)    pt denies this.   Multiple thyroid nodules    Sleep apnea    CPAP   Thyroid cancer [facility])    Past Surgical History:  Procedure Laterality Date   ANAL FISSURE REPAIR  2004   BREAST LUMPECTOMY WITH RADIOACTIVE SEED LOCALIZATION Left 01/01/2022   Procedure: LEFT BREAST SEED LUMPECTOMY;  Surgeon: Erroll Luna, MD;  Location: [place];  Service: General;  Laterality: Left;   CESAREAN SECTION     COLONOSCOPY     INSERTION OF MESH N/A 07/26/2020   Procedure: INSERTION OF MESH;  Surgeon: Ralene Ok, MD;  Location: [place];  Service: General;  Laterality: N/A;   LIGAMENT REPAIR Right 11/23/2015   Procedure: right index radial collateral LIGAMENT REPAIR;  Surgeon: Leanora Cover, MD;  Location: [place];  Service: Orthopedics;  Laterality: Right;   RE-EXCISION OF BREAST LUMPECTOMY Left 01/22/2022   Procedure: RE-EXCISION OF LEFT BREAST LUMPECTOMY;  Surgeon: Erroll Luna, MD;  Location: [place];  Service: General;  Laterality: Left;   SENTINEL NODE BIOPSY Left 01/22/2022   Procedure: LEFT SENTINEL NODE BIOPSY;  Surgeon: Erroll Luna, MD;  Location: [place];  Service: General;  Laterality: Left;   THYROIDECTOMY N/A 01/01/2022    Procedure: TOTAL THYROIDECTOMY WITH LIMITED LYMPH NODE DISSECTION;  Surgeon: Armandina Gemma, MD;  Location: [place] [facility];  Service: General;  Laterality: N/A;   UPPER ENDOSCOPY W/ ESOPHAGEAL MANOMETRY  10/2021   VENTRAL HERNIA REPAIR N/A 07/26/2020   Procedure: LAPAROSCOPIC VENTRAL HERNIA REPAIR WITH MESH;  Surgeon: Ralene Ok, MD;  Location: [place];  Service: General;  Laterality: N/A;   Patient Active Problem List   Diagnosis Date Noted   Malignant neoplasm of upper-inner quadrant of left breast in female, estrogen receptor positive ([place]) 01/09/2022   Postsurgical hypothyroidism 01/04/2022   Multiple thyroid nodules 12/20/2021   Papillary thyroid carcinoma ([place]) 12/20/2021   Nodule of lower lobe of right lung 10/17/2021   Left upper lobe pulmonary nodule 10/17/2021   Acromioclavicular sprain, left, initial encounter 07/27/2021   Tibialis posterior tendinitis, right 06/15/2020   Piriformis syndrome of left side 32/99/2426   Umbilical hernia 83/41/9622   Lumbar radiculopathy 07/09/2019   Contusion of right hip and thigh 04/20/2019   Loss of transverse plantar arch 02/11/2019   Sprain of iliolumbar ligament 10/29/2018   Piriformis syndrome of right side 09/30/2018   Nonallopathic lesion of sacral region 09/30/2018   Nonallopathic lesion of lumbosacral region 09/30/2018   Nonallopathic lesion of thoracic region 09/30/2018   Peroneal tendinitis of left lower extremity 02/11/2018   Polyarthralgia 02/11/2018  Patellofemoral syndrome of right knee 02/11/2018   Gastroesophageal reflux disease 01/16/2017   Cough variant asthma 01/16/2017   Noncompliance with medication treatment due to intermittent use of medication 01/16/2017   Allergic rhinoconjunctivitis 11/08/2015   Sinobronchitis 10/03/2015   OSA (obstructive sleep apnea) 12/14/2014   Crohn's disease of colon ([place]) 05/24/2014   Depression 02/01/2014   Disorder of intestine 12/17/2010   Anorectal polyp 12/17/2010   Glaucoma  01/14/2010   Dry skin dermatitis 01/15/2008   Crohn's disease ([place]) 01/14/2005    REFERRING PROVIDER: Dr. Erroll Luna  REFERRING DIAG: Left breast cancer  THERAPY DIAG:  Malignant neoplasm of upper-inner quadrant of left breast in female, estrogen receptor positive ([place])  Abnormal posture  Aftercare following surgery for neoplasm  Rationale for Evaluation and Treatment: Rehabilitation  ONSET DATE: 01/22/2022  SUBJECTIVE:                                                                                                                                                                                           SUBJECTIVE STATEMENT: Patient underwent a left breast re-excision and sentinel node biopsy (1 negative node removed) on 01/22/2022 following a left lumpectomy on 01/01/2022 as reported below. Her Oncotype score was 31 so she will begin chemotherapy 02/11/2022 after her port is placed on 02/07/2022. This will be followed by radiation an anti-estrogen therapy.  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 It measures 1.5 cm and is located in the upper inner quadrant with IDC only being present in lateral margin. It is ER/PR positive and HER2 negative with a Ki-67 of 5%. Thyroidectomy was also performed at the same time and 2 lymph nodes removed (nodes were negative) and all gross disease removed. Thyroid cancer will be treated with radioactive iodine.   PATIENT GOALS:  Reassess how my recovery is going related to arm function, pain, and swelling.  PAIN:  Are you having pain? Yes: NPRS scale: varies/10 Pain location: left axilla Pain description: fullness Aggravating factors: unknown Relieving factors: unknown  PRECAUTIONS: Recent Surgery, left UE Lymphedema risk, Other: Also being treated for thyroid cancer  ACTIVITY LEVEL / LEISURE: Plays tennis and walks/hikes; does some kind of cardio 4x/week     OBJECTIVE:   PATIENT SURVEYS:  QUICK DASH:  Quick Dash - 02/04/22 0001     Open a tight or new jar Moderate difficulty    Do heavy household chores (wash walls, wash floors) Mild difficulty    Carry a shopping bag or briefcase Mild difficulty    Wash your back No difficulty    Use  a knife to cut food No difficulty    Recreational activities in which you take some force or impact through your arm, shoulder, or hand (golf, hammering, tennis) Severe difficulty    During the past week, to what extent has your arm, shoulder or hand problem interfered with your normal social activities with family, friends, neighbors, or groups? Modererately    During the past week, to what extent has your arm, shoulder or hand problem limited your work or other regular daily activities Slightly    Arm, shoulder, or hand pain. Mild    Tingling (pins and needles) in your arm, shoulder, or hand None    Difficulty Sleeping Mild difficulty    DASH Score 27.27 %              OBSERVATIONS: Left axillary incision appears red but not infected. It may be due to friction from the bra. Applied compression foam to reduce swelling in that area. Instructed her with gentle scar massage followed by using coconut oil to soften scar. She demonstrated good understanding.  POSTURE:  Forward head and rounded shoulders posture  LYMPHEDEMA ASSESSMENT:  UPPER EXTREMITY AROM/PROM:   A/PROM RIGHT   eval    Shoulder extension 40  Shoulder flexion 141  Shoulder abduction 152  Shoulder internal rotation 46  Shoulder external rotation 87                          (Blank rows = not tested)   A/PROM LEFT   eval LEFT 02/04/2022  Shoulder extension 40 39  Shoulder flexion 134 140  Shoulder abduction 157 164  Shoulder internal rotation 45 63  Shoulder external rotation 87 82                          (Blank rows = not tested)   CERVICAL AROM: Not tested due to recent thyroidectomy     UPPER EXTREMITY STRENGTH:  Left shoulder IR 4-/5 with c/o pain; all others 5/5   LYMPHEDEMA ASSESSMENTS:    LANDMARK RIGHT   eval RIGHT 02/04/2022  10 cm proximal to olecranon process 26.7 26.6  Olecranon process 24.2 24.3  10 cm proximal to ulnar styloid process 20.9 20.8  Just proximal to ulnar styloid process 14.7 14.6  Across hand at thumb web space 20 19.3  At base of 2nd digit 6.2 6  (Blank rows = not tested)   LANDMARK LEFT   eval LEFT 02/04/2022  10 cm proximal to olecranon process 26.3 25.9  Olecranon process 23.5 23.3  10 cm proximal to ulnar styloid process 20.7 20.4  Just proximal to ulnar styloid process 14.2 14  Across hand at thumb web space 20.2 19.1  At base of 2nd digit 6.2 5.9  (Blank rows = not tested)  Surgery type/Date: Left breast re-excision and sentinel node biopsy 01/22/2022 Number of lymph nodes removed: 1 Current/past treatment (chemo, radiation, hormone therapy): none Other symptoms:  Heaviness/tightness Yes Pain No Pitting edema No Infections No Decreased scar mobility Yes Stemmer sign No  PATIENT EDUCATION:  Education details: Reviewed HEP Person educated: Patient Education method: Explanation, Demonstration, Verbal cues, and Handouts Education comprehension: verbalized understanding and returned demonstration  HOME EXERCISE PROGRAM: Reviewed previously given post op HEP.  ASSESSMENT:  CLINICAL IMPRESSION: Patient is healing well s/p left breast re-excision and sentinel node biopsy (1 negative node) on 01/22/2022. Her axillary incision appears red and angry but not infected. It  may be due to the incision rubbing on her bra. Applied compression foam to her axilla to reduce edema and provide padding. Shoulder ROM and function is back to baseline and there are no signs of lymphedema.   Pt will benefit from skilled therapeutic intervention to improve on the following deficits: Decreased knowledge of precautions, impaired UE functional use, pain, decreased ROM, postural  dysfunction.   PT treatment/interventions: ADL/Self care home management, Therapeutic exercises, Therapeutic activity, Patient/Family education, Self Care, and Re-evaluation   GOALS: Goals reviewed with patient? Yes  LONG TERM GOALS:  (STG=LTG)  GOALS Name Target Date  Goal status  1 Pt will demonstrate she has regained full shoulder ROM and function post operatively compared to baselines.  Baseline: 03/07/2022 MET     PLAN:  PT FREQUENCY/DURATION: On hold; may want to return to PT after chemo begins  PLAN FOR NEXT SESSION: Will D/C if she does not decide to do PT for her shoulder within the month. Otherwise, will reassess left anterior shoulder pain (appears to be issue with internal rotation) and begin treatment.   [facility]  [address], [address]  [place] [address]  [phone]  After Breast Cancer Class It is recommended you attend the ABC class to be educated on lymphedema risk reduction. This class is free of charge and lasts for 1 hour. It is a 1-time class. You will need to download the Webex app either on your phone or computer. We will send you a link the night before or the morning of the class. You should be able to click on that link to join the class. This is not a confidential class. You don't have to turn your camera on, but other participants may be able to see your email address.  Scar massage You can begin gentle scar massage to you incision sites. Gently place one hand on the incision and move the skin (without sliding on the skin) in various directions. Do this for a few minutes and then you can gently massage either coconut oil or vitamin E cream into the scars.  Compression garment You should continue wearing your compression bra until you feel like you no longer have swelling.  Home exercise Program Continue doing the exercises you were given until you feel like you can do them without feeling any tightness at the end.    Walking Program Studies show that 30 minutes of walking per day (fast enough to elevate your heart rate) can significantly reduce the risk of a cancer recurrence. If you can't walk due to other medical reasons, we encourage you to find another activity you could do (like a stationary bike or water exercise).  Posture After breast cancer surgery, people frequently sit with rounded shoulders posture because it puts their incisions on slack and feels better. If you sit like this and scar tissue forms in that position, you can become very tight and have pain sitting or standing with good posture. Try to be aware of your posture and sit and stand up tall to heal properly.  Follow up PT: It is recommended you return every 3 months for the first 3 years following surgery to be assessed on the SOZO machine for an L-Dex score. This helps prevent clinically significant lymphedema in 95% of patients. These follow up screens are 10 minute appointments that you are not billed for.  Annia Friendly, [place] 02/04/22 3:48 PM

## 2022-02-04 ENCOUNTER — Ambulatory Visit: Payer: 59 | Attending: Surgery | Admitting: Physical Therapy

## 2022-02-04 ENCOUNTER — Inpatient Hospital Stay: Payer: 59

## 2022-02-04 ENCOUNTER — Inpatient Hospital Stay: Payer: 59 | Admitting: Pharmacist

## 2022-02-04 ENCOUNTER — Encounter: Payer: Self-pay | Admitting: *Deleted

## 2022-02-04 ENCOUNTER — Other Ambulatory Visit: Payer: Self-pay

## 2022-02-04 ENCOUNTER — Encounter: Payer: Self-pay | Admitting: Physical Therapy

## 2022-02-04 VITALS — BP 120/69 | HR 82 | Temp 97.4°F | Resp 18 | Ht 62.0 in | Wt 153.6 lb

## 2022-02-04 DIAGNOSIS — C50212 Malignant neoplasm of upper-inner quadrant of left female breast: Secondary | ICD-10-CM | POA: Insufficient documentation

## 2022-02-04 DIAGNOSIS — C73 Malignant neoplasm of thyroid gland: Secondary | ICD-10-CM | POA: Diagnosis not present

## 2022-02-04 DIAGNOSIS — Z483 Aftercare following surgery for neoplasm: Secondary | ICD-10-CM | POA: Diagnosis not present

## 2022-02-04 DIAGNOSIS — Z170 Estrogen receptor positive status [ER+]: Secondary | ICD-10-CM | POA: Diagnosis not present

## 2022-02-04 DIAGNOSIS — R293 Abnormal posture: Secondary | ICD-10-CM | POA: Insufficient documentation

## 2022-02-04 DIAGNOSIS — Z5189 Encounter for other specified aftercare: Secondary | ICD-10-CM | POA: Diagnosis not present

## 2022-02-04 DIAGNOSIS — Z5111 Encounter for antineoplastic chemotherapy: Secondary | ICD-10-CM | POA: Diagnosis not present

## 2022-02-04 NOTE — Patient Instructions (Signed)
[facility]  [address], [address]  [place] [address]  [phone]  After Breast Cancer Class It is recommended you attend the ABC class to be educated on lymphedema risk reduction. This class is free of charge and lasts for 1 hour. It is a 1-time class. You will need to download the Webex app either on your phone or computer. We will send you a link the night before or the morning of the class. You should be able to click on that link to join the class. This is not a confidential class. You don't have to turn your camera on, but other participants may be able to see your email address.  Scar massage You can begin gentle scar massage to you incision sites. Gently place one hand on the incision and move the skin (without sliding on the skin) in various directions. Do this for a few minutes and then you can gently massage either coconut oil or vitamin E cream into the scars.  Compression garment You should continue wearing your compression bra until you feel like you no longer have swelling.  Home exercise Program Continue doing the exercises you were given until you feel like you can do them without feeling any tightness at the end.   Walking Program Studies show that 30 minutes of walking per day (fast enough to elevate your heart rate) can significantly reduce the risk of a cancer recurrence. If you can't walk due to other medical reasons, we encourage you to find another activity you could do (like a stationary bike or water exercise).  Posture After breast cancer surgery, people frequently sit with rounded shoulders posture because it puts their incisions on slack and feels better. If you sit like this and scar tissue forms in that position, you can become very tight and have pain sitting or standing with good posture. Try to be aware of your posture and sit and stand up tall to heal properly.  Follow up PT: It is recommended you return every 3 months for the  first 3 years following surgery to be assessed on the SOZO machine for an L-Dex score. This helps prevent clinically significant lymphedema in 95% of patients. These follow up screens are 10 minute appointments that you are not billed for. 

## 2022-02-04 NOTE — Progress Notes (Signed)
[place]       Telephone: [phone]?Fax: [phone]   Oncology Clinical Pharmacist Practitioner Initial Assessment  [name] is a 62 y.o. female with a diagnosis of breast cancer. They were contacted today via in-person visit.  Indication/Regimen Docetaxel (Taxotere) and cyclophosphamide (Cytoxan) are being used appropriately for treatment of breast cancer by Dr. Nicholas Lose.      Wt Readings from Last 1 Encounters:  02/04/22 153 lb 9.6 oz (69.7 kg)    Estimated body surface area is 1.75 meters squared as calculated from the following:   Height as of this encounter: '5\' 2"'$  (1.575 m).   Weight as of this encounter: 153 lb 9.6 oz (69.7 kg).  The dosing regimen cycle is every 21 days x 4 cycles  Docetaxel (75 mg/m2) on Day 1 Cyclophosphamide (600 mg/m2) on Day 1 Pegfilgrastim (6 mg) on Day 3  It is planned to continue until treatment plan completion or unacceptable toxicity. The tentative start date is: 02/11/22  Dose Modifications No chemotherapy dose modifications by Dr. Lindi Adie   Allergies Allergies  Allergen Reactions   Chlorhexidine Gluconate Itching   Gabapentin Itching   Hydrocodone Itching   Oxycodone Hcl Itching    NDC QVZD:[id number]  NDC JJOA:[id number]   Betadine [Povidone Iodine] Rash   Vitals    02/04/2022    9:21 AM 01/30/2022   12:41 PM 01/25/2022   12:52 PM  Oncology Vitals  Height 158 cm 158 cm   Weight 69.673 kg 68.947 kg 70.109 kg  Weight (lbs) 153 lbs 10 oz 152 lbs 154 lbs 9 oz  BMI 28.09 kg/m2   28.09 kg/m2 27.8 kg/m2   27.8 kg/m2 28.27 kg/m2   28.27 kg/m2  Temp 97.4 F (36.3 C) 97.8 F (36.6 C) 97.2 F (36.2 C)  Pulse Rate 82 82 70  BP 120/69 113/60 152/85  Resp '18 18 17  '$ SpO2 98 % 100 % 100 %  BSA (m2) 1.75 m2   1.75 m2 1.74 m2   1.74 m2 1.75 m2   1.75 m2     Laboratory Data = no labs done today for chemotherapy education visit  Contraindications Contraindications were reviewed?  Yes Contraindications to therapy were identified? No   Safety Precautions The following safety precautions were reviewed:  Fever: reviewed the importance of having a thermometer and the Centers for Disease Control and Prevention (CDC) definition of fever which is 100.53F (38C) or higher. Patient should call 24/7 triage at [phone] if experiencing a fever or any other symptoms Decreased white blood cells (WBCs) and increased risk for infection Decreased platelet count and increased risk of bleeding Decreased hemoglobin, part of the red blood cells that carry iron and oxygen Hair Loss Fatigue Fluid retention or swelling (edema) Peripheral Neuropathy Mouth sores Rash or itchy skin Muscle or joint pain or weakness Nausea or vomiting Diarrhea Nail Changes Hypersensitivity reactions Secondary Malignancies Hemorrhagic cystitis Pneumonitis Intimacy, sexual activity, contraception, fertility Handling body fluids and waste  Medication Reconciliation Current Outpatient Medications  Medication Sig Dispense Refill   Adalimumab (HUMIRA, 2 SYRINGE,) 40 MG/0.4ML PSKT Inject 0.4 mLs (40 mg total) under the skin every 14 (fourteen) days. 2 each 5   bimatoprost (LUMIGAN) 0.01 % SOLN Instill 1 drop into both eyes at bedtime 2.5 mL 10   busPIRone (BUSPAR) 7.5 MG tablet Take 1 tablet (7.5 mg total) by mouth 2 (two) times daily. 180 tablet 2   cetirizine (ZYRTEC) 10 MG tablet Take 10 mg by mouth  in the morning.     DULoxetine (CYMBALTA) 60 MG capsule Take 1 capsule (60 mg total) by mouth daily. 90 capsule 1   levothyroxine (SYNTHROID) 50 MCG tablet Take 2 tablets (100 mcg total) by mouth daily before breakfast. 60 tablet 3   omeprazole (PRILOSEC) 40 MG capsule Take 1 capsule (40 mg total) by mouth daily. 90 capsule 3   Vitamin D, Ergocalciferol, (DRISDOL) 1.25 MG (50000 UNIT) CAPS capsule Take 1 capsule (50,000 Units total) by mouth every 7 (seven) days. 12 capsule 0   albuterol (VENTOLIN HFA)  108 (90 Base) MCG/ACT inhaler Inhale 2 puffs into the lungs every 6 (six) hours as needed for wheezing or shortness of breath. (Patient not taking: Reported on 02/04/2022) 8.5 g 3   ascorbic acid (VITAMIN C) 500 MG tablet Take 500 mg by mouth 2 (two) times a week. (Patient not taking: Reported on 02/04/2022)     calcium carbonate (TUMS) 500 MG chewable tablet Chew 2 tablets (400 mg of elemental calcium total) by mouth 2 (two) times daily. (Patient not taking: Reported on 02/04/2022) 90 tablet 1   celecoxib (CELEBREX) 100 MG capsule Take 1 capsule (100 mg total) by mouth in the morning and at bedtime. (Patient not taking: Reported on 02/04/2022) 60 capsule 11   dexamethasone (DECADRON) 4 MG tablet Take 1 tablet day before chemo and 1 tablet day after chemo with food. (Patient not taking: Reported on 02/04/2022) 10 tablet 0   fluocinonide ointment (LIDEX) 2.83 % Apply 1 application topically daily as needed (irritation). (Patient not taking: Reported on 02/04/2022)     fluticasone (FLONASE) 50 MCG/ACT nasal spray Place 2 sprays into both nostrils daily. (Patient not taking: Reported on 02/04/2022) 16 g 1   lidocaine-prilocaine (EMLA) cream Apply to affected area once (Patient not taking: Reported on 02/04/2022) 30 g 3   ondansetron (ZOFRAN) 8 MG tablet Take 1 tablet (8 mg total) by mouth every 8 (eight) hours as needed for nausea or vomiting. Start on the third day after chemotherapy. (Patient not taking: Reported on 02/04/2022) 30 tablet 1   oxyCODONE (OXY IR/ROXICODONE) 5 MG immediate release tablet Take 1 tablet (5 mg total) by mouth every 6 (six) hours as needed for severe pain. (Patient not taking: Reported on 02/04/2022) 15 tablet 0   prochlorperazine (COMPAZINE) 10 MG tablet Take 1 tablet (10 mg total) by mouth every 6 (six) hours as needed for nausea or vomiting. (Patient not taking: Reported on 02/04/2022) 30 tablet 1   Current Facility-Administered Medications  Medication Dose Route Frequency Provider Last  Rate Last Admin   0.9 %  sodium chloride infusion  500 mL Intravenous Continuous Armbruster, Carlota Raspberry, MD       Medication reconciliation is based on the patient's most recent medication list in the electronic medical record (EMR) including herbal products and OTC medications.   The patient's medication list was reviewed today with the patient? Yes   Drug-drug interactions (DDIs) DDIs were evaluated? Yes Significant DDIs identified? No   Drug-Food Interactions Drug-food interactions were evaluated? Yes Drug-food interactions identified? No   Follow-up Plan  Treatment start date: 02/11/22 -- will change labs to through port per patient preference Port placement date: 02/07/22 Reviewed prescriptions, premedications, and chemotherapy potential side effects. Reviewed sequencing of chemotherapy with growth factors Anti-nausea medications as needed Loperamide for diarrhea as needed Clinical pharmacy will assist Dr. Lindi Adie and Ms. Bultema on an as needed basis going forward  BLAIZE NIPPER participated in the discussion, expressed understanding,  and voiced agreement with the above plan. All questions were answered to her satisfaction. The patient was advised to contact the clinic at [phone] with any questions or concerns prior to her return visit.   I spent 60 minutes assessing the patient.  Raina Mina, RPH-CPP, 02/04/2022 10:20 AM  **Disclaimer: This note was dictated with voice recognition software. Similar sounding words can inadvertently be transcribed and this note may contain transcription errors which may not have been corrected upon publication of note.**

## 2022-02-05 ENCOUNTER — Other Ambulatory Visit: Payer: Self-pay

## 2022-02-05 ENCOUNTER — Other Ambulatory Visit: Payer: Self-pay | Admitting: Hematology and Oncology

## 2022-02-05 NOTE — Progress Notes (Signed)
Pharmacist Chemotherapy Monitoring - Initial Assessment    Anticipated start date: 02/11/22   The following has been reviewed per standard work regarding the patient's treatment regimen: The patient's diagnosis, treatment plan and drug doses, and organ/hematologic function Lab orders and baseline tests specific to treatment regimen  The treatment plan start date, drug sequencing, and pre-medications Prior authorization status  Patient's documented medication list, including drug-drug interaction screen and prescriptions for anti-emetics and supportive care specific to the treatment regimen The drug concentrations, fluid compatibility, administration routes, and timing of the medications to be used The patient's access for treatment and lifetime cumulative dose history, if applicable  The patient's medication allergies and previous infusion related reactions, if applicable   Changes made to treatment plan:  N/A  Follow up needed:  Pending authorization for treatment    Kennith Center, Pharm.D., CPP 02/05/2022'@3'$ :37 PM

## 2022-02-06 ENCOUNTER — Other Ambulatory Visit: Payer: Self-pay

## 2022-02-06 ENCOUNTER — Encounter (HOSPITAL_COMMUNITY): Payer: Self-pay | Admitting: Surgery

## 2022-02-06 NOTE — Anesthesia Preprocedure Evaluation (Addendum)
Anesthesia Evaluation  Patient identified by MRN, date of birth, ID band Patient awake    Reviewed: Allergy & Precautions, NPO status , Patient's Chart, lab work & pertinent test results  Airway Mallampati: II  TM Distance: >3 FB Neck ROM: Full    Dental no notable dental hx. (+) Teeth Intact, Dental Advisory Given   Pulmonary asthma , sleep apnea and Continuous Positive Airway Pressure Ventilation    Pulmonary exam normal breath sounds clear to auscultation       Cardiovascular negative cardio ROS Normal cardiovascular exam Rhythm:Regular Rate:Normal     Neuro/Psych  PSYCHIATRIC DISORDERS Anxiety Depression    negative neurological ROS     GI/Hepatic Neg liver ROS,GERD  ,,Crohn's   Endo/Other  Hypothyroidism    Renal/GU negative Renal ROS  negative genitourinary   Musculoskeletal  (+) Arthritis ,    Abdominal   Peds  Hematology negative hematology ROS (+)   Anesthesia Other Findings Left breast CA  Reproductive/Obstetrics                             Anesthesia Physical Anesthesia Plan  ASA: 3  Anesthesia Plan: General   Post-op Pain Management: Tylenol PO (pre-op)*   Induction: Intravenous  PONV Risk Score and Plan: 3 and Ondansetron, Dexamethasone and Midazolam  Airway Management Planned: LMA  Additional Equipment:   Intra-op Plan:   Post-operative Plan: Extubation in OR  Informed Consent: I have reviewed the patients History and Physical, chart, labs and discussed the procedure including the risks, benefits and alternatives for the proposed anesthesia with the patient or authorized representative who has indicated his/her understanding and acceptance.     Dental advisory given  Plan Discussed with: CRNA  Anesthesia Plan Comments:        Anesthesia Quick Evaluation

## 2022-02-06 NOTE — Pre-Procedure Instructions (Signed)
PCP - Dr.Mary [name] Cardiologist - pt denies  PPM/ICD - pt denies Device Orders - n/a Rep Notified - n/a  EKG - 01/22/22 Stress Test - pt denies ECHO - pt denies Cardiac Cath - pt denies  Sleep Study/CPAP - yes-pt does not know settings  Diabetic- pt denies Fasting Blood Sugar -  Checks Blood Sugar _____ times a day  Last dose of GLP1 agonist-  n/a GLP1 instructions: n/a  Blood Thinner Instructions:pt denies Aspirin Instructions:pt denies  ERAS Protcol - YES  COVID TEST- n/a   Anesthesia review: NO   Patient verbally denies any shortness of breath, fever, cough and chest pain during phone call.     -------------  SDW INSTRUCTIONS given:   Your procedure is scheduled on 02/07/22.             Report to [facility] Main Entrance "A" at  5:30  A.M., and check in at the Admitting office.             Call this number if you have problems the morning of surgery:             [phone]               Remember:             Do not eat or drink after midnight the night before your surgery                          Take these medicines the morning of surgery with A SIP OF WATER -levothyroxine pt states she only takes that medication morning of surgery  As of today, STOP taking any Aspirin (unless otherwise instructed by your surgeon) Aleve, Naproxen, Ibuprofen, Motrin, Advil, Goody's, BC's, all herbal medications, fish oil, and all vitamins.                       Do not wear jewelry, make up, or nail polish            Do not wear lotions, powders, perfumes/colognes, or deodorant.            Do not shave 48 hours prior to surgery.  Men may shave face and neck.            Do not bring valuables to the hospital.            [facility] is not responsible for any belongings or valuables.   Do NOT Smoke (Tobacco/Vaping) 24 hours prior to your procedure If you use a CPAP at night, you may bring all equipment for your overnight stay.   Contacts, glasses, dentures or partials may not  be worn into surgery.      For patients admitted to the hospital, discharge time will be determined by your treatment team.   Patients discharged the day of surgery will not be allowed to drive home, and someone needs to stay with them for 24 hours.     Special instructions:   [facility]- Preparing For Surgery  Oral Hygiene is also important to reduce your risk of infection.  Remember - BRUSH YOUR TEETH THE MORNING OF SURGERY WITH YOUR REGULAR TOOTHPASTE   Before surgery, you can play an important role. Because skin is not sterile, your skin needs to be as free of germs as possible. You can reduce the number of germs on your skin by washing with Dial (or any antibacterial) Soap before  surgery.    Please do not use if you have an allergy to antibacterial soaps. If your skin becomes reddened/irritated stop using the Antibacterial Soap  Do not shave (including legs and underarms) for at least 48 hours prior to surgery. It is OK to shave your face.   Please follow these instructions carefully.              Shower the NIGHT BEFORE SURGERY and the MORNING OF SURGERY with (DIAL) Antibacterial Soap. Wash your body and hair with your normal shampoo/soap then rinse. Using a clean wash cloth wash from your neck down using the antibacterial soap, do not wash private area with the clean wash cloth.    Pat yourself dry with a CLEAN TOWEL.   Wear CLEAN PAJAMAS to bed the night before surgery.   Place CLEAN SHEETS on your bed the night of your surgery and DO NOT SLEEP WITH PETS.     Day of Surgery: Please shower morning of surgery using antibacterial soap Wear Clean/Comfortable clothing the morning of surgery Do not apply any deodorants/lotions.   Remember to brush your teeth WITH YOUR REGULAR TOOTHPASTE.   Questions were answered. Patient verbalized understanding of instructions.

## 2022-02-07 ENCOUNTER — Other Ambulatory Visit: Payer: Self-pay

## 2022-02-07 ENCOUNTER — Ambulatory Visit (HOSPITAL_COMMUNITY): Payer: 59

## 2022-02-07 ENCOUNTER — Ambulatory Visit (HOSPITAL_BASED_OUTPATIENT_CLINIC_OR_DEPARTMENT_OTHER): Payer: 59 | Admitting: Anesthesiology

## 2022-02-07 ENCOUNTER — Ambulatory Visit (HOSPITAL_COMMUNITY): Payer: 59 | Admitting: Anesthesiology

## 2022-02-07 ENCOUNTER — Ambulatory Visit (HOSPITAL_COMMUNITY)
Admission: RE | Admit: 2022-02-07 | Discharge: 2022-02-07 | Disposition: A | Discharge status: Home / Self Care | Payer: 59 | Attending: Surgery | Admitting: Surgery

## 2022-02-07 ENCOUNTER — Other Ambulatory Visit (HOSPITAL_COMMUNITY): Payer: Self-pay

## 2022-02-07 ENCOUNTER — Encounter (HOSPITAL_COMMUNITY)
Admission: RE | Disposition: A | Discharge status: Home / Self Care | Payer: Self-pay | Source: Home / Self Care | Attending: Surgery

## 2022-02-07 ENCOUNTER — Encounter (HOSPITAL_COMMUNITY): Payer: Self-pay | Admitting: Surgery

## 2022-02-07 DIAGNOSIS — I872 Venous insufficiency (chronic) (peripheral): Secondary | ICD-10-CM

## 2022-02-07 DIAGNOSIS — Z9989 Dependence on other enabling machines and devices: Secondary | ICD-10-CM | POA: Diagnosis not present

## 2022-02-07 DIAGNOSIS — G4733 Obstructive sleep apnea (adult) (pediatric): Secondary | ICD-10-CM | POA: Diagnosis not present

## 2022-02-07 DIAGNOSIS — Z170 Estrogen receptor positive status [ER+]: Secondary | ICD-10-CM | POA: Insufficient documentation

## 2022-02-07 DIAGNOSIS — E039 Hypothyroidism, unspecified: Secondary | ICD-10-CM

## 2022-02-07 DIAGNOSIS — C73 Malignant neoplasm of thyroid gland: Secondary | ICD-10-CM | POA: Diagnosis not present

## 2022-02-07 DIAGNOSIS — C50412 Malignant neoplasm of upper-outer quadrant of left female breast: Secondary | ICD-10-CM | POA: Insufficient documentation

## 2022-02-07 DIAGNOSIS — F418 Other specified anxiety disorders: Secondary | ICD-10-CM

## 2022-02-07 DIAGNOSIS — G4730 Sleep apnea, unspecified: Secondary | ICD-10-CM | POA: Diagnosis not present

## 2022-02-07 DIAGNOSIS — K219 Gastro-esophageal reflux disease without esophagitis: Secondary | ICD-10-CM | POA: Insufficient documentation

## 2022-02-07 DIAGNOSIS — J45909 Unspecified asthma, uncomplicated: Secondary | ICD-10-CM | POA: Diagnosis not present

## 2022-02-07 DIAGNOSIS — C50912 Malignant neoplasm of unspecified site of left female breast: Secondary | ICD-10-CM

## 2022-02-07 HISTORY — PX: PORTACATH PLACEMENT: SHX2246

## 2022-02-07 HISTORY — DX: Pneumonia, unspecified organism: J18.9

## 2022-02-07 SURGERY — INSERTION, TUNNELED CENTRAL VENOUS DEVICE, WITH PORT
Anesthesia: General | Site: Chest | Laterality: Right

## 2022-02-07 MED ORDER — ONDANSETRON HCL 4 MG/2ML IJ SOLN
INTRAMUSCULAR | Status: AC
  Filled 2022-02-07: qty 2

## 2022-02-07 MED ORDER — MIDAZOLAM HCL 2 MG/2ML IJ SOLN
INTRAMUSCULAR | Status: AC
  Filled 2022-02-07: qty 2

## 2022-02-07 MED ORDER — ONDANSETRON HCL 4 MG/2ML IJ SOLN
INTRAMUSCULAR | Status: DC | PRN
  Administered 2022-02-07: 4 mg via INTRAVENOUS

## 2022-02-07 MED ORDER — DEXAMETHASONE SODIUM PHOSPHATE 10 MG/ML IJ SOLN
INTRAMUSCULAR | Status: AC
  Filled 2022-02-07: qty 1

## 2022-02-07 MED ORDER — CHLORHEXIDINE GLUCONATE 0.12 % MT SOLN: 15.0000 mL | Freq: Once | OROMUCOSAL | Status: DC

## 2022-02-07 MED ORDER — PHENYLEPHRINE 80 MCG/ML (10ML) SYRINGE FOR IV PUSH (FOR BLOOD PRESSURE SUPPORT)
PREFILLED_SYRINGE | INTRAVENOUS | Status: DC | PRN
  Administered 2022-02-07: 40 ug via INTRAVENOUS
  Administered 2022-02-07: 80 ug via INTRAVENOUS
  Administered 2022-02-07: 160 ug via INTRAVENOUS
  Administered 2022-02-07: 120 ug via INTRAVENOUS
  Administered 2022-02-07 (×3): 80 ug via INTRAVENOUS
  Administered 2022-02-07: 120 ug via INTRAVENOUS

## 2022-02-07 MED ORDER — PHENYLEPHRINE 80 MCG/ML (10ML) SYRINGE FOR IV PUSH (FOR BLOOD PRESSURE SUPPORT)
PREFILLED_SYRINGE | INTRAVENOUS | Status: AC
  Filled 2022-02-07: qty 10

## 2022-02-07 MED ORDER — LIDOCAINE 2% (20 MG/ML) 5 ML SYRINGE
INTRAMUSCULAR | Status: AC
  Filled 2022-02-07: qty 5

## 2022-02-07 MED ORDER — ORAL CARE MOUTH RINSE
15.0000 mL | Freq: Once | OROMUCOSAL | Status: AC
  Administered 2022-02-07: 15 mL via OROMUCOSAL

## 2022-02-07 MED ORDER — BUPIVACAINE-EPINEPHRINE (PF) 0.5% -1:200000 IJ SOLN
INTRAMUSCULAR | Status: AC
  Filled 2022-02-07: qty 30

## 2022-02-07 MED ORDER — FENTANYL CITRATE (PF) 250 MCG/5ML IJ SOLN
INTRAMUSCULAR | Status: AC
  Filled 2022-02-07: qty 5

## 2022-02-07 MED ORDER — CEFAZOLIN SODIUM-DEXTROSE 2-4 GM/100ML-% IV SOLN
2.0000 g | INTRAVENOUS | Status: AC
  Administered 2022-02-07: 2 g via INTRAVENOUS
  Filled 2022-02-07: qty 100

## 2022-02-07 MED ORDER — HEPARIN 6000 UNIT IRRIGATION SOLUTION
Status: AC
  Filled 2022-02-07: qty 500

## 2022-02-07 MED ORDER — TRAMADOL HCL 50 MG PO TABS
50.0000 mg | ORAL_TABLET | Freq: Four times a day (QID) | ORAL | 0 refills | Status: DC | PRN
  Filled 2022-02-07: qty 20, 5d supply, fill #0

## 2022-02-07 MED ORDER — 0.9 % SODIUM CHLORIDE (POUR BTL) OPTIME
TOPICAL | Status: DC | PRN
  Administered 2022-02-07: 1000 mL

## 2022-02-07 MED ORDER — ACETAMINOPHEN 500 MG PO TABS
1000.0000 mg | ORAL_TABLET | ORAL | Status: AC
  Administered 2022-02-07: 1000 mg via ORAL
  Filled 2022-02-07: qty 2

## 2022-02-07 MED ORDER — FENTANYL CITRATE (PF) 100 MCG/2ML IJ SOLN: 25.0000 ug | INTRAMUSCULAR | Status: DC | PRN

## 2022-02-07 MED ORDER — MIDAZOLAM HCL 2 MG/2ML IJ SOLN
INTRAMUSCULAR | Status: DC | PRN
  Administered 2022-02-07: 2 mg via INTRAVENOUS

## 2022-02-07 MED ORDER — EPHEDRINE SULFATE-NACL 50-0.9 MG/10ML-% IV SOSY
PREFILLED_SYRINGE | INTRAVENOUS | Status: DC | PRN
  Administered 2022-02-07: 10 mg via INTRAVENOUS
  Administered 2022-02-07: 5 mg via INTRAVENOUS
  Administered 2022-02-07: 10 mg via INTRAVENOUS

## 2022-02-07 MED ORDER — HEPARIN SOD (PORK) LOCK FLUSH 100 UNIT/ML IV SOLN
INTRAVENOUS | Status: AC
  Filled 2022-02-07: qty 5

## 2022-02-07 MED ORDER — DEXAMETHASONE SODIUM PHOSPHATE 10 MG/ML IJ SOLN
INTRAMUSCULAR | Status: DC | PRN
  Administered 2022-02-07: 10 mg via INTRAVENOUS

## 2022-02-07 MED ORDER — LACTATED RINGERS IV SOLN: INTRAVENOUS | Status: DC

## 2022-02-07 MED ORDER — HEPARIN SOD (PORK) LOCK FLUSH 100 UNIT/ML IV SOLN
INTRAVENOUS | Status: DC | PRN
  Administered 2022-02-07: 500 [IU]

## 2022-02-07 MED ORDER — EPHEDRINE 5 MG/ML INJ
INTRAVENOUS | Status: AC
  Filled 2022-02-07: qty 5

## 2022-02-07 MED ORDER — BUPIVACAINE-EPINEPHRINE 0.5% -1:200000 IJ SOLN
INTRAMUSCULAR | Status: DC | PRN
  Administered 2022-02-07: 10 mL

## 2022-02-07 MED ORDER — LIDOCAINE 2% (20 MG/ML) 5 ML SYRINGE
INTRAMUSCULAR | Status: DC | PRN
  Administered 2022-02-07: 40 mg via INTRAVENOUS

## 2022-02-07 MED ORDER — FENTANYL CITRATE (PF) 250 MCG/5ML IJ SOLN
INTRAMUSCULAR | Status: DC | PRN
  Administered 2022-02-07 (×3): 25 ug via INTRAVENOUS

## 2022-02-07 MED ORDER — PROPOFOL 10 MG/ML IV BOLUS
INTRAVENOUS | Status: AC
  Filled 2022-02-07: qty 20

## 2022-02-07 MED ORDER — HEPARIN 6000 UNIT IRRIGATION SOLUTION
Status: DC | PRN
  Administered 2022-02-07: 1

## 2022-02-07 MED ORDER — PROPOFOL 10 MG/ML IV BOLUS
INTRAVENOUS | Status: DC | PRN
  Administered 2022-02-07: 150 mg via INTRAVENOUS
  Administered 2022-02-07: 50 mg via INTRAVENOUS

## 2022-02-07 SURGICAL SUPPLY — 38 items
BAG COUNTER SPONGE SURGICOUNT (BAG) ×1 IMPLANT
BAG DECANTER FOR FLEXI CONT (MISCELLANEOUS) ×1 IMPLANT
BAG SPNG CNTER NS LX DISP (BAG) ×1
CHLORAPREP W/TINT 26 (MISCELLANEOUS) ×1 IMPLANT
COVER SURGICAL LIGHT HANDLE (MISCELLANEOUS) ×1 IMPLANT
COVER TRANSDUCER ULTRASND GEL (DISPOSABLE) ×1 IMPLANT
DERMABOND ADVANCED .7 DNX12 (GAUZE/BANDAGES/DRESSINGS) ×1 IMPLANT
DRAPE C-ARM 42X120 X-RAY (DRAPES) ×1 IMPLANT
ELECT CAUTERY BLADE 6.4 (BLADE) ×1 IMPLANT
ELECT REM PT RETURN 9FT ADLT (ELECTROSURGICAL) ×1
ELECTRODE REM PT RTRN 9FT ADLT (ELECTROSURGICAL) ×1 IMPLANT
GAUZE 4X4 16PLY ~~LOC~~+RFID DBL (SPONGE) ×1 IMPLANT
GEL ULTRASOUND 20GR AQUASONIC (MISCELLANEOUS) IMPLANT
GLOVE BIO SURGEON STRL SZ8 (GLOVE) ×1 IMPLANT
GLOVE BIOGEL PI IND STRL 8 (GLOVE) ×1 IMPLANT
GOWN STRL REUS W/ TWL LRG LVL3 (GOWN DISPOSABLE) ×1 IMPLANT
GOWN STRL REUS W/ TWL XL LVL3 (GOWN DISPOSABLE) ×1 IMPLANT
GOWN STRL REUS W/TWL LRG LVL3 (GOWN DISPOSABLE) ×1
GOWN STRL REUS W/TWL XL LVL3 (GOWN DISPOSABLE) ×1
INTRODUCER COOK 11FR (CATHETERS) IMPLANT
KIT BASIN OR (CUSTOM PROCEDURE TRAY) ×1 IMPLANT
KIT PORT POWER 8FR ISP CVUE (Port) IMPLANT
KIT TURNOVER KIT B (KITS) ×1 IMPLANT
NS IRRIG 1000ML POUR BTL (IV SOLUTION) ×1 IMPLANT
PAD ARMBOARD 7.5X6 YLW CONV (MISCELLANEOUS) ×1 IMPLANT
PENCIL BUTTON HOLSTER BLD 10FT (ELECTRODE) ×1 IMPLANT
POSITIONER HEAD DONUT 9IN (MISCELLANEOUS) ×1 IMPLANT
SET INTRODUCER 12FR PACEMAKER (INTRODUCER) IMPLANT
SET SHEATH INTRODUCER 10FR (MISCELLANEOUS) IMPLANT
SHEATH COOK PEEL AWAY SET 9F (SHEATH) IMPLANT
SUT MNCRL AB 4-0 PS2 18 (SUTURE) ×1 IMPLANT
SUT PROLENE 2 0 SH 30 (SUTURE) ×1 IMPLANT
SUT VIC AB 3-0 SH 27 (SUTURE) ×1
SUT VIC AB 3-0 SH 27X BRD (SUTURE) ×1 IMPLANT
SYR 5ML LUER SLIP (SYRINGE) ×1 IMPLANT
TOWEL GREEN STERILE (TOWEL DISPOSABLE) ×1 IMPLANT
TOWEL GREEN STERILE FF (TOWEL DISPOSABLE) ×1 IMPLANT
TRAY LAPAROSCOPIC MC (CUSTOM PROCEDURE TRAY) ×1 IMPLANT

## 2022-02-07 NOTE — Discharge Instructions (Signed)
    PORT-A-CATH: POST OP INSTRUCTIONS  Always review your discharge instruction sheet given to you by the facility where your surgery was performed.   A prescription for pain medication may be given to you upon discharge. Take your pain medication as prescribed, if needed. If narcotic pain medicine is not needed, then you make take acetaminophen (Tylenol) or ibuprofen (Advil) as needed.  Take your usually prescribed medications unless otherwise directed. If you need a refill on your pain medication, please contact our office. All narcotic pain medicine now requires a paper prescription.  Phoned in and fax refills are no longer allowed by law.  Prescriptions will not be filled after 5 pm or on weekends.  You should follow a light diet for the remainder of the day after your procedure. Most patients will experience some mild swelling and/or bruising in the area of the incision. It may take several days to resolve. It is common to experience some constipation if taking pain medication after surgery. Increasing fluid intake and taking a stool softener (such as Colace) will usually help or prevent this problem from occurring. A mild laxative (Milk of Magnesia or Miralax) should be taken according to package directions if there are no bowel movements after 48 hours.  Unless discharge instructions indicate otherwise, you may remove your bandages 48 hours after surgery, and you may shower at that time. You may have steri-strips (small white skin tapes) in place directly over the incision.  These strips should be left on the skin for 7-10 days.  If your surgeon used Dermabond (skin glue) on the incision, you may shower in 24 hours.  The glue will flake off over the next 2-3 weeks.  If your port is left accessed at the end of surgery (needle left in port), the dressing cannot get wet and should only by changed by a healthcare professional. When the port is no longer accessed (when the needle has been removed),  follow step 7.   ACTIVITIES:  Limit activity involving your arms for the next 72 hours. Do no strenuous exercise or activity for 1 week. You may drive when you are no longer taking prescription pain medication, you can comfortably wear a seatbelt, and you can maneuver your car. 10.You may need to see your doctor in the office for a follow-up appointment.  Please       check with your doctor.  11.When you receive a new Port-a-Cath, you will get a product guide and        ID card.  Please keep them in case you need them.  WHEN TO CALL YOUR DOCTOR [phone]): Fever over 101.0 Chills Continued bleeding from incision Increased redness and tenderness at the site Shortness of breath, difficulty breathing   The clinic staff is available to answer your questions during regular business hours. Please don't hesitate to call and ask to speak to one of the nurses or medical assistants for clinical concerns. If you have a medical emergency, go to the nearest emergency room or call 911.  A surgeon from [facility] is always on call at the hospital.     For further information, please visit [email or web address]

## 2022-02-07 NOTE — Transfer of Care (Signed)
Immediate Anesthesia Transfer of Care Note  Patient: [name]  Procedure(s) Performed: INSERTION PORT-A-CATH (Right: Chest)  Patient Location: PACU  Anesthesia Type:General  Level of Consciousness: alert   Airway & Oxygen Therapy: Patient Spontanous Breathing and Patient connected to face mask oxygen  Post-op Assessment: Report given to RN, Post -op Vital signs reviewed and stable, and Patient moving all extremities X 4  Post vital signs: Reviewed and stable  Last Vitals:  Vitals Value Taken Time  BP 115/61 02/07/22 0851  Temp    Pulse 81 02/07/22 0852  Resp 14 02/07/22 0852  SpO2 100 % 02/07/22 0852  Vitals shown include unvalidated device data.  Last Pain:  Vitals:   02/07/22 0624  TempSrc: Oral  PainSc: 0-No pain         Complications: No notable events documented.

## 2022-02-07 NOTE — Op Note (Signed)
Preoperative diagnosis: PAC needed for chemotherapy   Postoperative diagnosis: Same  Procedure: Portacath Placement with C arm and U/S guidance   Surgeon: Turner Daniels, MD, FACS  Anesthesia: General and 0.25 % marcaine with epinephrine  Clinical History and Indications: The patient is getting ready to begin chemotherapy for her cancer. She  needs a Port-A-Cath for venous access. Risk of bleeding, infection,  Collapse lung,  Death,  DVT,  Organ injury,  Mediastinal injury,  Injury to heart,  Injury to blood vessels,  Nerves,  Migration of catheter,  Embolization of catheter and the need for more surgery.  Description of Procedure: I have seen the patient in the holding area and confirmed the plans for the procedure as noted above. I reviewed the risks and complications again and the patient has no further questions. She wishes to proceed.   The patient was then taken to the operating room. After satisfactory general  anesthesia had been obtained the upper chest and lower neck were prepped and draped as a sterile field. The timeout was done.  The right internal jugular vein  was entered under U/S guidance  and the guidewire threaded into the superior vena cava right atrial area under fluoroscopic guidance. An incision was then made on the anterior chest wall and a subcutaneous pocket fashioned for the port reservoir.  The port tubing was then brought through a subcutaneous tunnel from the port site to the guidewire site.  The port and catheter were attached, locked  and flushed. The catheter was measured and cut to appropriate length.The dilator and peel-away sheath were then advanced over the guidewire while monitoring this with fluoroscopy. The guidewire and dilator were removed and the tubing threaded to approximately 20  cm. The peel-away sheath was then removed. The catheter aspirated and flushed easily. Using fluoroscopy the tip was in the superior vena cava right atrial junction area. It  aspirated and flushed easily. That aspirated and flushed easily.  The reservoir was secured to the fascia with 1 sutures of 2-0 Prolene. A final check with fluoroscopy was done to make sure we had no kinks and good positioning of the tip of the catheter. Everything appeared to be okay. The catheter was aspirated, flushed with dilute heparin and then concentrated aqueous heparin.  The incision was then closed with interrupted 3-0 Vicryl, and 4-0 Monocryl subcuticular with Dermabond on the skin.  There were no operative complications. Estimated blood loss was minimal. All counts were correct. The patient tolerated the procedure well.  Turner Daniels, MD, FACS

## 2022-02-07 NOTE — Interval H&P Note (Signed)
History and Physical Interval Note:  02/07/2022 7:13 AM  [name]  has presented today for surgery, with the diagnosis of [place].  The various methods of treatment have been discussed with the patient and family. After consideration of risks, benefits and other options for treatment, the patient has consented to  Procedure(s): INSERTION PORT-A-CATH (N/A) as a surgical intervention.  The patient's history has been reviewed, patient examined, no change in status, stable for surgery.  I have reviewed the patient's chart and labs.  Questions were answered to the patient's satisfaction.     [place]

## 2022-02-07 NOTE — Anesthesia Procedure Notes (Signed)
Procedure Name: LMA Insertion Date/Time: 02/07/2022 7:49 AM  Performed by: Heide Scales, CRNAPre-anesthesia Checklist: Patient identified, Emergency Drugs available, Suction available and Patient being monitored Patient Re-evaluated:Patient Re-evaluated prior to induction Oxygen Delivery Method: Circle system utilized Preoxygenation: Pre-oxygenation with 100% oxygen Induction Type: IV induction Ventilation: Mask ventilation without difficulty LMA: LMA inserted LMA Size: 4.0 Number of attempts: 1 Airway Equipment and Method: Bite block Placement Confirmation: ETT inserted through vocal cords under direct vision, positive ETCO2 and breath sounds checked- equal and bilateral Tube secured with: Tape Dental Injury: Teeth and Oropharynx as per pre-operative assessment

## 2022-02-07 NOTE — H&P (Signed)
Chief Complaint: Post Operative Visit (LT BR RSL(SOLIS) LUMP)   History of Present Illness: [name] is a 62 y.o. female who is seen today for postop follow-up after reexcision left breast lumpectomy and sentinel lymph node mapping for stage I left breast cancer. She had an Oncotype score of 31 and requires chemotherapy. She is here today to discuss port placement. She is seeing Dr. Harlow Asa for follow-up of her thyroid as well today..    Review of Systems: A complete review of systems was obtained from the patient. I have reviewed this information and discussed as appropriate with the patient. See HPI as well for other ROS.    Medical History: No past medical history on file.  Patient Active Problem List  Diagnosis  Papillary thyroid carcinoma (CMS-HCC)  Multiple thyroid nodules  Status post total thyroidectomy   No past surgical history on file.   Allergies  Allergen Reactions  Gabapentin Itching  Oxycodone Hcl Itching  NDC TGGY:[id number] NDC JKKX:[id number]  NDC RCVE:[id number] NDC HENI:[id number]  Chlorhexidine Gluconate Itching  Hydrocodone Itching   Current Outpatient Medications on File Prior to Visit  Medication Sig Dispense Refill  acetaminophen (TYLENOL) 500 MG tablet Take by mouth  albuterol 90 mcg/actuation inhaler Inhale into the lungs  ascorbic acid, vitamin C, (VITAMIN C) 500 MG tablet Take by mouth  busPIRone (BUSPAR) 7.5 MG tablet Take 1 tablet by mouth 2 (two) times daily  celecoxib (CELEBREX) 100 MG capsule TAKE 1 CAPSULE (100 MG TOTAL) BY MOUTH TWO (2) TIMES A DAY.  cetirizine (ZYRTEC) 10 MG tablet Take by mouth  DULoxetine (CYMBALTA) 60 MG DR capsule Take by mouth  estradiol (VIVELLE-DOT) patch 0.05 mg/24 hr Place onto the skin  famotidine (PEPCID) 20 MG tablet Take 20 mg by mouth  fluticasone propionate (FLONASE) 50 mcg/actuation nasal spray Place into one nostril  mupirocin (BACTROBAN) 2 % ointment Apply 1 Application topically 2 (two)  times daily  omeprazole (PRILOSEC) 40 MG DR capsule Take by mouth  progesterone (PROMETRIUM) 100 MG capsule Take by mouth   No current facility-administered medications on file prior to visit.   No family history on file.   Social History   Tobacco Use  Smoking Status Not on file  Smokeless Tobacco Not on file    Social History   Socioeconomic History  Marital status: Married   Objective:   Vitals:  01/30/22 1040  PainSc: 0-No pain   There is no height or weight on file to calculate BMI.    Left breast incision shows a reaction to the Dermabond. The left axillary incision shows reaction to the. Well-healed scar noted. No significant swelling.  Labs, Imaging and Diagnostic Testing:  01/24/2022  FINAL MICROSCOPIC DIAGNOSIS:   A. BREAST, LEFT ANTERIOR SUPERIOR MARGIN, RE-EXCISION:  - Negative for carcinoma in situ or invasive carcinoma   B. BREAST, LEFT MEDIAL INFERIOR MARGIN, RE-EXCISION:  - Negative for carcinoma in situ or invasive carcinoma   C. BREAST, LEFT POSTERIOR LATERAL MARGIN, RE-EXCISION:  - Negative for carcinoma in situ or invasive carcinoma   D. SENTINEL LYMPH NODE, LEFT AXILLARY, BIOPSY:  - Negative for carcinoma (0/1)   Assessment and Plan:   Diagnoses and all orders for this visit:  Post-operative state  Malignant neoplasm of upper-outer quadrant of left breast in female, estrogen receptor positive     Reviewed risk of port placement to include but not exclusive of bleeding, infection, pneumothorax, mass perigee, injury to mediastinal structures, cardiovascular event, rarely death, DVT, catheter migration, catheter malfunction  we also reviewed other means of vascular access and the pros and cons of that given her situation.  No follow-ups on file.  Kennieth Francois, MD

## 2022-02-08 ENCOUNTER — Other Ambulatory Visit (HOSPITAL_COMMUNITY): Payer: Self-pay

## 2022-02-08 ENCOUNTER — Other Ambulatory Visit: Payer: Commercial Managed Care - PPO

## 2022-02-08 ENCOUNTER — Encounter (HOSPITAL_COMMUNITY): Payer: Self-pay | Admitting: Surgery

## 2022-02-08 MED FILL — Dexamethasone Sodium Phosphate Inj 100 MG/10ML: INTRAMUSCULAR | Qty: 1 | Status: AC

## 2022-02-11 ENCOUNTER — Inpatient Hospital Stay (HOSPITAL_BASED_OUTPATIENT_CLINIC_OR_DEPARTMENT_OTHER): Payer: 59 | Admitting: Hematology and Oncology

## 2022-02-11 ENCOUNTER — Inpatient Hospital Stay: Payer: 59

## 2022-02-11 ENCOUNTER — Ambulatory Visit: Payer: Self-pay | Admitting: Physical Therapy

## 2022-02-11 ENCOUNTER — Encounter: Payer: Self-pay | Admitting: *Deleted

## 2022-02-11 VITALS — BP 155/88 | HR 72 | Temp 97.3°F | Resp 14 | Wt 152.7 lb

## 2022-02-11 VITALS — BP 125/80 | HR 70 | Temp 98.1°F | Resp 16

## 2022-02-11 DIAGNOSIS — Z170 Estrogen receptor positive status [ER+]: Secondary | ICD-10-CM

## 2022-02-11 DIAGNOSIS — Z5189 Encounter for other specified aftercare: Secondary | ICD-10-CM | POA: Diagnosis not present

## 2022-02-11 DIAGNOSIS — C73 Malignant neoplasm of thyroid gland: Secondary | ICD-10-CM | POA: Diagnosis not present

## 2022-02-11 DIAGNOSIS — C50212 Malignant neoplasm of upper-inner quadrant of left female breast: Secondary | ICD-10-CM | POA: Diagnosis not present

## 2022-02-11 DIAGNOSIS — Z95828 Presence of other vascular implants and grafts: Secondary | ICD-10-CM

## 2022-02-11 DIAGNOSIS — Z5111 Encounter for antineoplastic chemotherapy: Secondary | ICD-10-CM | POA: Diagnosis not present

## 2022-02-11 LAB — CBC WITH DIFFERENTIAL (CANCER CENTER ONLY)
Abs Immature Granulocytes: 0.05 10*3/uL (ref 0.00–0.07)
Basophils Absolute: 0.1 10*3/uL (ref 0.0–0.1)
Basophils Relative: 0 %
Eosinophils Absolute: 0.5 10*3/uL (ref 0.0–0.5)
Eosinophils Relative: 3 %
HCT: 33.3 % — ABNORMAL LOW (ref 36.0–46.0)
Hemoglobin: 11.5 g/dL — ABNORMAL LOW (ref 12.0–15.0)
Immature Granulocytes: 0 %
Lymphocytes Relative: 18 %
Lymphs Abs: 2.5 10*3/uL (ref 0.7–4.0)
MCH: 31.3 pg (ref 26.0–34.0)
MCHC: 34.5 g/dL (ref 30.0–36.0)
MCV: 90.7 fL (ref 80.0–100.0)
Monocytes Absolute: 1.2 10*3/uL — ABNORMAL HIGH (ref 0.1–1.0)
Monocytes Relative: 9 %
Neutro Abs: 9.7 10*3/uL — ABNORMAL HIGH (ref 1.7–7.7)
Neutrophils Relative %: 70 %
Platelet Count: 266 10*3/uL (ref 150–400)
RBC: 3.67 MIL/uL — ABNORMAL LOW (ref 3.87–5.11)
RDW: 13.3 % (ref 11.5–15.5)
WBC Count: 14 10*3/uL — ABNORMAL HIGH (ref 4.0–10.5)
nRBC: 0 % (ref 0.0–0.2)

## 2022-02-11 LAB — CMP (CANCER CENTER ONLY)
ALT: 16 U/L (ref 0–44)
AST: 18 U/L (ref 15–41)
Albumin: 3.9 g/dL (ref 3.5–5.0)
Alkaline Phosphatase: 43 U/L (ref 38–126)
Anion gap: 6 (ref 5–15)
BUN: 16 mg/dL (ref 8–23)
CO2: 27 mmol/L (ref 22–32)
Calcium: 9.6 mg/dL (ref 8.9–10.3)
Chloride: 105 mmol/L (ref 98–111)
Creatinine: 0.64 mg/dL (ref 0.44–1.00)
GFR, Estimated: >60 mL/min (ref >60)
Glucose, Bld: 77 mg/dL (ref 70–99)
Potassium: 3.4 mmol/L — ABNORMAL LOW (ref 3.5–5.1)
Sodium: 138 mmol/L (ref 135–145)
Total Bilirubin: 0.3 mg/dL (ref 0.3–1.2)
Total Protein: 7 g/dL (ref 6.5–8.1)

## 2022-02-11 MED ORDER — SODIUM CHLORIDE 0.9 % IV SOLN
150.0000 mg | Freq: Once | INTRAVENOUS | Status: AC
  Administered 2022-02-11: 150 mg via INTRAVENOUS
  Filled 2022-02-11: qty 150

## 2022-02-11 MED ORDER — SODIUM CHLORIDE 0.9 % IV SOLN: Freq: Once | INTRAVENOUS | Status: AC

## 2022-02-11 MED ORDER — SODIUM CHLORIDE 0.9 % IV SOLN
10.0000 mg | Freq: Once | INTRAVENOUS | Status: AC
  Administered 2022-02-11: 10 mg via INTRAVENOUS
  Filled 2022-02-11: qty 10

## 2022-02-11 MED ORDER — PALONOSETRON HCL INJECTION 0.25 MG/5ML
0.2500 mg | Freq: Once | INTRAVENOUS | Status: AC
  Administered 2022-02-11: 0.25 mg via INTRAVENOUS
  Filled 2022-02-11: qty 5

## 2022-02-11 MED ORDER — SODIUM CHLORIDE 0.9% FLUSH
10.0000 mL | INTRAVENOUS | Status: AC | PRN
  Administered 2022-02-11: 10 mL

## 2022-02-11 MED ORDER — HEPARIN SOD (PORK) LOCK FLUSH 100 UNIT/ML IV SOLN
500.0000 [IU] | Freq: Once | INTRAVENOUS | Status: AC | PRN
  Administered 2022-02-11: 500 [IU]

## 2022-02-11 MED ORDER — SODIUM CHLORIDE 0.9 % IV SOLN
600.0000 mg/m2 | Freq: Once | INTRAVENOUS | Status: AC
  Administered 2022-02-11: 1000 mg via INTRAVENOUS
  Filled 2022-02-11: qty 50

## 2022-02-11 MED ORDER — SODIUM CHLORIDE 0.9 % IV SOLN
75.0000 mg/m2 | Freq: Once | INTRAVENOUS | Status: AC
  Administered 2022-02-11: 130 mg via INTRAVENOUS
  Filled 2022-02-11: qty 13

## 2022-02-11 MED ORDER — SODIUM CHLORIDE 0.9% FLUSH
10.0000 mL | INTRAVENOUS | Status: DC | PRN
  Administered 2022-02-11: 10 mL

## 2022-02-11 MED ORDER — SODIUM CHLORIDE 0.9 % IV SOLN
600.0000 mg/m2 | Freq: Once | INTRAVENOUS | Status: DC
  Filled 2022-02-11 (×2): qty 50

## 2022-02-11 NOTE — Progress Notes (Signed)
Patient Care Team: Elby Showers, MD as PCP - General (Internal Medicine) Mauro Kaufmann, RN as Oncology Nurse Navigator Rockwell Germany, RN as Oncology Nurse Navigator Nicholas Lose, MD as Consulting Physician (Hematology and Oncology)  DIAGNOSIS:  Encounter Diagnosis  Name Primary?   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) 0/2 lymph nodes)   01/16/2022 Cancer Staging   Staging form: Breast, AJCC 8th Edition - Clinical: Stage IA (cT1c, cN0, cM0, G2, ER+, PR+, HER2-) - Signed by Nicholas Lose, MD on 01/16/2022 Histologic grading system: 3 grade system   01/22/2022 Surgery   Margin reexcision surgery: Benign margins, sentinel lymph node 0/1   01/23/2022 Oncotype testing   Recurrence score: 31 (19% distant recurrence risk at 9 years)   02/08/2022 -  Chemotherapy   Patient is on Treatment Plan : BREAST TC q21d       CHIEF COMPLIANT: Follow-up to start Taxotere and Cytoxan   INTERVAL HISTORY: [name] is a 62 year old with above-mentioned history of estrogen receptor positive breast cancer. She presents to the clinic for a follow-up. She reports that she is a little sore, but not as bad. Her main concern was getting Genetic testing. She says she doing emotionally good. She does get nervous before she has to come. She is also anxious about how she is going to feel afterwards.    ALLERGIES:  is allergic to chlorhexidine gluconate, gabapentin,  hydrocodone, oxycodone hcl, and betadine [povidone iodine].  MEDICATIONS:  Current Outpatient Medications  Medication Sig Dispense Refill   Adalimumab (HUMIRA, 2 SYRINGE,) 40 MG/0.4ML PSKT Inject 0.4 mLs (40 mg total) under the skin every 14 (fourteen) days. 2 each 5   albuterol (VENTOLIN HFA) 108 (90 Base) MCG/ACT inhaler Inhale 2 puffs into the lungs every 6 (six) hours as needed for wheezing or shortness of breath. 8.5 g 3   ascorbic acid (VITAMIN C) 500 MG tablet Take 500 mg by mouth 2 (two) times a week.     bimatoprost (LUMIGAN) 0.01 % SOLN Instill 1 drop into both eyes at bedtime 2.5 mL 10   busPIRone (BUSPAR) 7.5 MG tablet Take 1 tablet (7.5 mg total) by mouth 2 (two) times daily. 180 tablet 2   calcium carbonate (TUMS) 500 MG chewable tablet Chew 2 tablets (400 mg of elemental calcium total) by mouth 2 (two) times daily. (Patient not taking: Reported on 02/04/2022) 90 tablet 1   celecoxib (CELEBREX) 100 MG capsule Take 1 capsule (100 mg total) by mouth in the morning and at bedtime. (Patient not taking: Reported on 02/04/2022) 60 capsule 11   cetirizine (ZYRTEC) 10 MG tablet Take 10 mg by mouth in the morning.     dexamethasone (DECADRON) 4 MG tablet Take 1 tablet day before chemo and 1 tablet day after chemo with food. (Patient taking differently: Take 4 mg by mouth See admin instructions. Take 4 mg one day before Chemo and 4 mg the day after chemo) 10 tablet 0   DULoxetine (CYMBALTA) 60 MG capsule Take  1 capsule (60 mg total) by mouth daily. 90 capsule 1   fluocinonide ointment (LIDEX) 0.09 % Apply 1 application  topically daily as needed (irritation).     fluticasone (FLONASE) 50 MCG/ACT nasal spray Place 2 sprays into both nostrils daily. (Patient not taking: Reported on 02/04/2022) 16 g 1   levothyroxine (SYNTHROID) 50 MCG tablet Take 2 tablets (100 mcg total) by mouth daily before breakfast. 60 tablet 3   lidocaine-prilocaine (EMLA) cream Apply to affected area once 30 g 3   omeprazole  (PRILOSEC) 40 MG capsule Take 1 capsule (40 mg total) by mouth daily. 90 capsule 3   ondansetron (ZOFRAN) 8 MG tablet Take 1 tablet (8 mg total) by mouth every 8 (eight) hours as needed for nausea or vomiting. Start on the third day after chemotherapy. 30 tablet 1   oxyCODONE (OXY IR/ROXICODONE) 5 MG immediate release tablet Take 1 tablet (5 mg total) by mouth every 6 (six) hours as needed for severe pain. 15 tablet 0   prochlorperazine (COMPAZINE) 10 MG tablet Take 1 tablet (10 mg total) by mouth every 6 (six) hours as needed for nausea or vomiting. 30 tablet 1   traMADol (ULTRAM) 50 MG tablet Take 1 tablet (50 mg total) by mouth every 6 (six) hours as needed. 20 tablet 0   Vitamin D, Ergocalciferol, (DRISDOL) 1.25 MG (50000 UNIT) CAPS capsule Take 1 capsule (50,000 Units total) by mouth every 7 (seven) days. 12 capsule 0   Current Facility-Administered Medications  Medication Dose Route Frequency Provider Last Rate Last Admin   0.9 %  sodium chloride infusion  500 mL Intravenous Continuous Armbruster, Carlota Raspberry, MD        PHYSICAL EXAMINATION: ECOG PERFORMANCE STATUS: 1 - Symptomatic but completely ambulatory  Vitals:   02/11/22 1009  BP: (!) 155/88  Pulse: 72  Resp: 14  Temp: (!) 97.3 F (36.3 C)  SpO2: 100%   Filed Weights   02/11/22 1009  Weight: 152 lb 11.2 oz (69.3 kg)      LABORATORY DATA:  I have reviewed the data as listed    Latest Ref Rng & Units 01/02/2022    2:27 AM 08/30/2021    9:39 AM 06/15/2020    9:34 AM  CMP  Glucose 70 - 99 mg/dL 139  86    BUN 8 - 23 mg/dL 6  13    Creatinine 0.44 - 1.00 mg/dL 0.59  0.74    Sodium 135 - 145 mmol/L 135  140    Potassium 3.5 - 5.1 mmol/L 3.9  4.4    Chloride 98 - 111 mmol/L 104  105    CO2 22 - 32 mmol/L 22  29    Calcium 8.9 - 10.3 mg/dL 8.8  9.8    Total Protein 6.1 - 8.1 g/dL  7.4  7.5   Total Bilirubin 0.2 - 1.2 mg/dL  0.8  0.7   AST 10 - 35 U/L  24  21   ALT 6 - 29 U/L  18  16     Lab Results  Component Value  Date   WBC 14.0 (H) 02/11/2022   HGB 11.5 (L) 02/11/2022   HCT 33.3 (L) 02/11/2022   MCV 90.7 02/11/2022   PLT 266 02/11/2022   NEUTROABS 9.7 (H) 02/11/2022    ASSESSMENT & PLAN:  Malignant neoplasm of upper-inner quadrant of left breast in female, estrogen receptor positive ([place]) 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 1.5  cm, lateral margin positive ER 90%, PR 100%, Ki-67 5%, HER2 equivocal by IHC 2+, FISH negative ratio 0.93 (Total thyroidectomy: Papillary carcinoma right lobe, 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  It looks reasonably healed to proceed with her treatment for today. She decided not to do Dignicap. Return to clinic in 1 week for toxicity check    No orders of the defined types were placed in this encounter.  The patient has a good understanding of the overall plan. she agrees with it. she will call with any problems that may develop before the next visit here. Total time spent: 30 mins including face to face time and time spent for planning, charting and co-ordination of care   Harriette Ohara, MD 02/11/22    I Gardiner Coins am acting as a Education administrator for [employer]  I have reviewed the above documentation for accuracy and completeness, and I agree with the above.

## 2022-02-11 NOTE — Patient Instructions (Signed)
[facility]  Discharge Instructions: Thank you for choosing [facility] to provide your oncology and hematology care.   If you have a lab appointment with the [facility], please go directly to the [facility] and check in at the registration area.   Wear comfortable clothing and clothing appropriate for easy access to any Portacath or PICC line.   We strive to give you quality time with your provider. You may need to reschedule your appointment if you arrive late (15 or more minutes).  Arriving late affects you and other patients whose appointments are after yours.  Also, if you miss three or more appointments without notifying the office, you may be dismissed from the clinic at the provider's discretion.      For prescription refill requests, have your pharmacy contact our office and allow 72 hours for refills to be completed.    Today you received the following chemotherapy and/or immunotherapy agents Taxotere, Cytoxan      To help prevent nausea and vomiting after your treatment, we encourage you to take your nausea medication as directed.  BELOW ARE SYMPTOMS THAT SHOULD BE REPORTED IMMEDIATELY: *FEVER GREATER THAN 100.4 F (38 C) OR HIGHER *CHILLS OR SWEATING *NAUSEA AND VOMITING THAT IS NOT CONTROLLED WITH YOUR NAUSEA MEDICATION *UNUSUAL SHORTNESS OF BREATH *UNUSUAL BRUISING OR BLEEDING *URINARY PROBLEMS (pain or burning when urinating, or frequent urination) *BOWEL PROBLEMS (unusual diarrhea, constipation, pain near the anus) TENDERNESS IN MOUTH AND THROAT WITH OR WITHOUT PRESENCE OF ULCERS (sore throat, sores in mouth, or a toothache) UNUSUAL RASH, SWELLING OR PAIN  UNUSUAL VAGINAL DISCHARGE OR ITCHING   Items with * indicate a potential emergency and should be followed up as soon as possible or go to the Emergency Department if any problems should occur.  Please show the CHEMOTHERAPY ALERT CARD or IMMUNOTHERAPY ALERT CARD  at check-in to the Emergency Department and triage nurse.  Should you have questions after your visit or need to cancel or reschedule your appointment, please contact [facility]  Dept: [phone]  and follow the prompts.  Office hours are 8:00 a.m. to 4:30 p.m. Monday - Friday. Please note that voicemails left after 4:00 p.m. may not be returned until the following business day.  We are closed weekends and major holidays. You have access to a nurse at all times for urgent questions. Please call the main number to the clinic Dept: [phone] and follow the prompts.   For any non-urgent questions, you may also contact your provider using MyChart. We now offer e-Visits for anyone 80 and older to request care online for non-urgent symptoms. For details visit [email or web address].   Also download the MyChart app! Go to the app store, search "MyChart", open the app, select , and log in with your MyChart username and password. Docetaxel Injection What is this medication? DOCETAXEL (doe se TAX el) treats some types of cancer. It works by slowing down the growth of cancer cells. This medicine may be used for other purposes; ask your health care provider or pharmacist if you have questions. COMMON BRAND NAME(S): Docefrez, Taxotere What should I tell my care team before I take this medication? They need to know if you have any of these conditions: Kidney disease Liver disease Low white blood cell levels Tingling of the fingers or toes or other nerve disorder An unusual or allergic reaction to docetaxel, polysorbate 80, other medications, foods, dyes, or  preservatives Pregnant or trying to get pregnant Breast-feeding How should I use this medication? This medication is injected into a vein. It is given by your care team in a hospital or clinic setting. Talk to your care team about the use of this medication in children. Special care may be  needed. Overdosage: If you think you have taken too much of this medicine contact a poison control center or emergency room at once. NOTE: This medicine is only for you. Do not share this medicine with others. What if I miss a dose? Keep appointments for follow-up doses. It is important not to miss your dose. Call your care team if you are unable to keep an appointment. What may interact with this medication? Do not take this medication with any of the following: Live virus vaccines This medication may also interact with the following: Certain antibiotics, such as clarithromycin, telithromycin Certain antivirals for HIV or hepatitis Certain medications for fungal infections, such as itraconazole, ketoconazole, voriconazole Grapefruit juice Nefazodone Supplements, such as St. John's wort This list may not describe all possible interactions. Give your health care provider a list of all the medicines, herbs, non-prescription drugs, or dietary supplements you use. Also tell them if you smoke, drink alcohol, or use illegal drugs. Some items may interact with your medicine. What should I watch for while using this medication? This medication may make you feel generally unwell. This is not uncommon as chemotherapy can affect healthy cells as well as cancer cells. Report any side effects. Continue your course of treatment even though you feel ill unless your care team tells you to stop. You may need blood work done while you are taking this medication. This medication can cause serious side effects and infusion reactions. To reduce the risk, your care team may give you other medications to take before receiving this one. Be sure to follow the directions from your care team. This medication may increase your risk of getting an infection. Call your care team for advice if you get a fever, chills, sore throat, or other symptoms of a cold or flu. Do not treat yourself. Try to avoid being around people who  are sick. Avoid taking medications that contain aspirin, acetaminophen, ibuprofen, naproxen, or ketoprofen unless instructed by your care team. These medications may hide a fever. Be careful brushing or flossing your teeth or using a toothpick because you may get an infection or bleed more easily. If you have any dental work done, tell your dentist you are receiving this medication. Some products may contain alcohol. Ask your care team if this medication contains alcohol. Be sure to tell all care teams you are taking this medicine. Certain medications, like metronidazole and disulfiram, can cause an unpleasant reaction when taken with alcohol. The reaction includes flushing, headache, nausea, vomiting, sweating, and increased thirst. The reaction can last from 30 minutes to several hours. This medication may affect your coordination, reaction time, or judgement. Do not drive or operate machinery until you know how this medication affects you. Sit up or stand slowly to reduce the risk of dizzy or fainting spells. Drinking alcohol with this medication can increase the risk of these side effects. Talk to your care team about your risk of cancer. You may be more at risk for certain types of cancer if you take this medication. Talk to your care team if you wish to become pregnant or think you might be pregnant. This medication can cause serious birth defects if taken during pregnancy  or if you get pregnant within 2 months after stopping therapy. A negative pregnancy test is required before starting this medication. A reliable form of contraception is recommended while taking this medication and for 2 months after stopping it. Talk to your care team about reliable forms of contraception. Do not breast-feed while taking this medication and for 1 week after stopping therapy. Use a condom during sex and for 4 months after stopping therapy. Tell your care team right away if you think your partner might be pregnant.  This medication can cause serious birth defects. This medication may cause infertility. Talk to your care team if you are concerned about your fertility. What side effects may I notice from receiving this medication? Side effects that you should report to your care team as soon as possible: Allergic reactions--skin rash, itching, hives, swelling of the face, lips, tongue, or throat Change in vision such as blurry vision, seeing halos around lights, vision loss Infection--fever, chills, cough, or sore throat Infusion reactions--chest pain, shortness of breath or trouble breathing, feeling faint or lightheaded Low red blood cell level--unusual weakness or fatigue, dizziness, headache, trouble breathing Pain, tingling, or numbness in the hands or feet Painful swelling, warmth, or redness of the skin, blisters or sores at the infusion site Redness, blistering, peeling, or loosening of the skin, including inside the mouth Sudden or severe stomach pain, bloody diarrhea, fever, nausea, vomiting Swelling of the ankles, hands, or feet Tumor lysis syndrome (TLS)--nausea, vomiting, diarrhea, decrease in the amount of urine, dark urine, unusual weakness or fatigue, confusion, muscle pain or cramps, fast or irregular heartbeat, joint pain Unusual bruising or bleeding Side effects that usually do not require medical attention (report to your care team if they continue or are bothersome): Change in nail shape, thickness, or color Change in taste Hair loss Increased tears This list may not describe all possible side effects. Call your doctor for medical advice about side effects. You may report side effects to FDA at 1-800-FDA-1088. Where should I keep my medication? This medication is given in a hospital or clinic. It will not be stored at home. NOTE: This sheet is a summary. It may not cover all possible information. If you have questions about this medicine, talk to your doctor, pharmacist, or health care  provider.  2023 Elsevier/Gold Standard (2007-02-21 00:00:00) Cyclophosphamide Injection What is this medication? CYCLOPHOSPHAMIDE (sye kloe FOSS fa mide) treats some types of cancer. It works by slowing down the growth of cancer cells. This medicine may be used for other purposes; ask your health care provider or pharmacist if you have questions. COMMON BRAND NAME(S): Cyclophosphamide, Cytoxan, Neosar What should I tell my care team before I take this medication? They need to know if you have any of these conditions: Heart disease Irregular heartbeat or rhythm Infection Kidney problems Liver disease Low blood cell levels (white cells, platelets, or red blood cells) Lung disease Previous radiation Trouble passing urine An unusual or allergic reaction to cyclophosphamide, other medications, foods, dyes, or preservatives Pregnant or trying to get pregnant Breast-feeding How should I use this medication? This medication is injected into a vein. It is given by your care team in a hospital or clinic setting. Talk to your care team about the use of this medication in children. Special care may be needed. Overdosage: If you think you have taken too much of this medicine contact a poison control center or emergency room at once. NOTE: This medicine is only for you. Do not share  this medicine with others. What if I miss a dose? Keep appointments for follow-up doses. It is important not to miss your dose. Call your care team if you are unable to keep an appointment. What may interact with this medication? Amphotericin B Amiodarone Azathioprine Certain antivirals for HIV or hepatitis Certain medications for blood pressure, such as enalapril, lisinopril, quinapril Cyclosporine Diuretics Etanercept Indomethacin Medications that relax muscles Metronidazole Natalizumab Tamoxifen Warfarin This list may not describe all possible interactions. Give your health care provider a list of all the  medicines, herbs, non-prescription drugs, or dietary supplements you use. Also tell them if you smoke, drink alcohol, or use illegal drugs. Some items may interact with your medicine. What should I watch for while using this medication? This medication may make you feel generally unwell. This is not uncommon as chemotherapy can affect healthy cells as well as cancer cells. Report any side effects. Continue your course of treatment even though you feel ill unless your care team tells you to stop. You may need blood work while you are taking this medication. This medication may increase your risk of getting an infection. Call your care team for advice if you get a fever, chills, sore throat, or other symptoms of a cold or flu. Do not treat yourself. Try to avoid being around people who are sick. Avoid taking medications that contain aspirin, acetaminophen, ibuprofen, naproxen, or ketoprofen unless instructed by your care team. These medications may hide a fever. Be careful brushing or flossing your teeth or using a toothpick because you may get an infection or bleed more easily. If you have any dental work done, tell your dentist you are receiving this medication. Drink water or other fluids as directed. Urinate often, even at night. Some products may contain alcohol. Ask your care team if this medication contains alcohol. Be sure to tell all care teams you are taking this medicine. Certain medicines, like metronidazole and disulfiram, can cause an unpleasant reaction when taken with alcohol. The reaction includes flushing, headache, nausea, vomiting, sweating, and increased thirst. The reaction can last from 30 minutes to several hours. Talk to your care team if you wish to become pregnant or think you might be pregnant. This medication can cause serious birth defects if taken during pregnancy and for 1 year after the last dose. A negative pregnancy test is required before starting this medication. A  reliable form of contraception is recommended while taking this medication and for 1 year after the last dose. Talk to your care team about reliable forms of contraception. Do not father a child while taking this medication and for 4 months after the last dose. Use a condom during this time period. Do not breast-feed while taking this medication or for 1 week after the last dose. This medication may cause infertility. Talk to your care team if you are concerned about your fertility. Talk to your care team about your risk of cancer. You may be more at risk for certain types of cancer if you take this medication. What side effects may I notice from receiving this medication? Side effects that you should report to your care team as soon as possible: Allergic reactions--skin rash, itching, hives, swelling of the face, lips, tongue, or throat Dry cough, shortness of breath or trouble breathing Heart failure--shortness of breath, swelling of the ankles, feet, or hands, sudden weight gain, unusual weakness or fatigue Heart muscle inflammation--unusual weakness or fatigue, shortness of breath, chest pain, fast or irregular heartbeat, dizziness,  swelling of the ankles, feet, or hands Heart rhythm changes--fast or irregular heartbeat, dizziness, feeling faint or lightheaded, chest pain, trouble breathing Infection--fever, chills, cough, sore throat, wounds that don't heal, pain or trouble when passing urine, general feeling of discomfort or being unwell Kidney injury--decrease in the amount of urine, swelling of the ankles, hands, or feet Liver injury--right upper belly pain, loss of appetite, nausea, light-colored stool, dark yellow or brown urine, yellowing skin or eyes, unusual weakness or fatigue Low red blood cell level--unusual weakness or fatigue, dizziness, headache, trouble breathing Low sodium level--muscle weakness, fatigue, dizziness, headache, confusion Red or dark brown urine Unusual bruising or  bleeding Side effects that usually do not require medical attention (report to your care team if they continue or are bothersome): Hair loss Irregular menstrual cycles or spotting Loss of appetite Nausea Pain, redness, or swelling with sores inside the mouth or throat Vomiting This list may not describe all possible side effects. Call your doctor for medical advice about side effects. You may report side effects to FDA at 1-800-FDA-1088. Where should I keep my medication? This medication is given in a hospital or clinic. It will not be stored at home. NOTE: This sheet is a summary. It may not cover all possible information. If you have questions about this medicine, talk to your doctor, pharmacist, or health care provider.  2023 Elsevier/Gold Standard (2021-02-20 00:00:00)

## 2022-02-11 NOTE — Assessment & Plan Note (Signed)
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

## 2022-02-12 ENCOUNTER — Ambulatory Visit: Payer: Self-pay | Admitting: Family Medicine

## 2022-02-12 ENCOUNTER — Other Ambulatory Visit (HOSPITAL_COMMUNITY): Payer: Self-pay

## 2022-02-12 ENCOUNTER — Encounter: Payer: Self-pay | Admitting: Hematology and Oncology

## 2022-02-12 NOTE — Progress Notes (Signed)
Called pt to introduce myself as her Arboriculturist and to discuss the [employer].  I went over what it covers but she declined the grant at this time and does not need my card.

## 2022-02-12 NOTE — Anesthesia Postprocedure Evaluation (Signed)
Anesthesia Post Note  Patient: [name]  Procedure(s) Performed: INSERTION PORT-A-CATH (Right: Chest)     Patient location during evaluation: PACU Anesthesia Type: General Level of consciousness: awake and alert Pain management: pain level controlled Vital Signs Assessment: post-procedure vital signs reviewed and stable Respiratory status: spontaneous breathing, nonlabored ventilation, respiratory function stable and patient connected to nasal cannula oxygen Cardiovascular status: blood pressure returned to baseline and stable Postop Assessment: no apparent nausea or vomiting Anesthetic complications: no  No notable events documented.  Last Vitals:  Vitals:   02/07/22 0915 02/07/22 0930  BP: 117/68 107/63  Pulse: 72 72  Resp: 13 13  Temp:  36.5 C  SpO2: 98% 96%    Last Pain:  Vitals:   02/07/22 0930  TempSrc:   PainSc: 0-No pain                 Laurance Heide L Bailee Metter

## 2022-02-13 ENCOUNTER — Inpatient Hospital Stay: Payer: 59

## 2022-02-13 ENCOUNTER — Telehealth: Payer: Self-pay | Admitting: Hematology and Oncology

## 2022-02-13 VITALS — BP 132/73 | HR 68 | Temp 98.6°F | Resp 16

## 2022-02-13 DIAGNOSIS — Z5189 Encounter for other specified aftercare: Secondary | ICD-10-CM | POA: Diagnosis not present

## 2022-02-13 DIAGNOSIS — C73 Malignant neoplasm of thyroid gland: Secondary | ICD-10-CM | POA: Diagnosis not present

## 2022-02-13 DIAGNOSIS — Z170 Estrogen receptor positive status [ER+]: Secondary | ICD-10-CM

## 2022-02-13 DIAGNOSIS — Z5111 Encounter for antineoplastic chemotherapy: Secondary | ICD-10-CM | POA: Diagnosis not present

## 2022-02-13 DIAGNOSIS — C50212 Malignant neoplasm of upper-inner quadrant of left female breast: Secondary | ICD-10-CM | POA: Diagnosis not present

## 2022-02-13 MED ORDER — PEGFILGRASTIM INJECTION 6 MG/0.6ML ~~LOC~~
6.0000 mg | PREFILLED_SYRINGE | Freq: Once | SUBCUTANEOUS | Status: AC
  Administered 2022-02-13: 6 mg via SUBCUTANEOUS
  Filled 2022-02-13: qty 0.6

## 2022-02-13 NOTE — Telephone Encounter (Signed)
Scheduled appointment per WQ. Patient is aware of all made appointments.

## 2022-02-13 NOTE — Patient Instructions (Signed)

## 2022-02-14 ENCOUNTER — Encounter: Payer: Self-pay | Admitting: *Deleted

## 2022-02-15 ENCOUNTER — Other Ambulatory Visit (HOSPITAL_COMMUNITY): Payer: Self-pay

## 2022-02-15 ENCOUNTER — Telehealth: Payer: Self-pay | Admitting: *Deleted

## 2022-02-15 NOTE — Telephone Encounter (Signed)
Received after hours message from pt with complaint of fever of 100.4 and GI upset.  RN placed call to pt this morning to f/u.  Pt states she took tylenol last night and was able to get a full nights rest.  Pt states she is no longer experiencing fever or GI upset.  RN educated pt to increase fluid intake and to contact our office if symptoms develop again.  Pt verbalized understanding.

## 2022-02-18 ENCOUNTER — Other Ambulatory Visit: Payer: 59

## 2022-02-18 ENCOUNTER — Inpatient Hospital Stay: Payer: 59

## 2022-02-18 ENCOUNTER — Other Ambulatory Visit: Payer: Self-pay | Admitting: *Deleted

## 2022-02-18 ENCOUNTER — Inpatient Hospital Stay: Payer: 59 | Attending: Hematology and Oncology | Admitting: Hematology and Oncology

## 2022-02-18 VITALS — BP 114/71 | HR 78 | Temp 97.0°F | Resp 16 | Wt 148.4 lb

## 2022-02-18 DIAGNOSIS — C50212 Malignant neoplasm of upper-inner quadrant of left female breast: Secondary | ICD-10-CM | POA: Insufficient documentation

## 2022-02-18 DIAGNOSIS — Z5111 Encounter for antineoplastic chemotherapy: Secondary | ICD-10-CM | POA: Insufficient documentation

## 2022-02-18 DIAGNOSIS — R197 Diarrhea, unspecified: Secondary | ICD-10-CM | POA: Insufficient documentation

## 2022-02-18 DIAGNOSIS — Z5189 Encounter for other specified aftercare: Secondary | ICD-10-CM | POA: Insufficient documentation

## 2022-02-18 DIAGNOSIS — K5090 Crohn's disease, unspecified, without complications: Secondary | ICD-10-CM | POA: Diagnosis not present

## 2022-02-18 DIAGNOSIS — Z170 Estrogen receptor positive status [ER+]: Secondary | ICD-10-CM

## 2022-02-18 DIAGNOSIS — R110 Nausea: Secondary | ICD-10-CM | POA: Diagnosis not present

## 2022-02-18 DIAGNOSIS — C73 Malignant neoplasm of thyroid gland: Secondary | ICD-10-CM | POA: Diagnosis not present

## 2022-02-18 LAB — CBC WITH DIFFERENTIAL (CANCER CENTER ONLY)
Abs Immature Granulocytes: 1.11 10*3/uL — ABNORMAL HIGH (ref 0.00–0.07)
Basophils Absolute: 0.1 10*3/uL (ref 0.0–0.1)
Basophils Relative: 1 %
Eosinophils Absolute: 0.4 10*3/uL (ref 0.0–0.5)
Eosinophils Relative: 4 %
HCT: 36.9 % (ref 36.0–46.0)
Hemoglobin: 12.3 g/dL (ref 12.0–15.0)
Immature Granulocytes: 12 %
Lymphocytes Relative: 29 %
Lymphs Abs: 2.7 10*3/uL (ref 0.7–4.0)
MCH: 31 pg (ref 26.0–34.0)
MCHC: 33.3 g/dL (ref 30.0–36.0)
MCV: 92.9 fL (ref 80.0–100.0)
Monocytes Absolute: 2 10*3/uL — ABNORMAL HIGH (ref 0.1–1.0)
Monocytes Relative: 21 %
Neutro Abs: 3.1 10*3/uL (ref 1.7–7.7)
Neutrophils Relative %: 33 %
Platelet Count: 190 10*3/uL (ref 150–400)
RBC: 3.97 MIL/uL (ref 3.87–5.11)
RDW: 13.1 % (ref 11.5–15.5)
Smear Review: NORMAL
WBC Count: 9.3 10*3/uL (ref 4.0–10.5)
nRBC: 0.9 % — ABNORMAL HIGH (ref 0.0–0.2)

## 2022-02-18 LAB — CMP (CANCER CENTER ONLY)
ALT: 16 U/L (ref 0–44)
AST: 17 U/L (ref 15–41)
Albumin: 3.6 g/dL (ref 3.5–5.0)
Alkaline Phosphatase: 52 U/L (ref 38–126)
Anion gap: 4 — ABNORMAL LOW (ref 5–15)
BUN: 12 mg/dL (ref 8–23)
CO2: 28 mmol/L (ref 22–32)
Calcium: 9.1 mg/dL (ref 8.9–10.3)
Chloride: 104 mmol/L (ref 98–111)
Creatinine: 0.68 mg/dL (ref 0.44–1.00)
GFR, Estimated: >60 mL/min (ref >60)
Glucose, Bld: 79 mg/dL (ref 70–99)
Potassium: 3.7 mmol/L (ref 3.5–5.1)
Sodium: 136 mmol/L (ref 135–145)
Total Bilirubin: 0.4 mg/dL (ref 0.3–1.2)
Total Protein: 6.3 g/dL — ABNORMAL LOW (ref 6.5–8.1)

## 2022-02-18 NOTE — Progress Notes (Addendum)
Patient Care Team: Elby Showers, MD as PCP - General (Internal Medicine) Mauro Kaufmann, RN as Oncology Nurse Navigator Rockwell Germany, RN as Oncology Nurse Navigator Nicholas Lose, MD as Consulting Physician (Hematology and Oncology) Nicholas Lose, MD as Consulting Physician (Hematology and Oncology)  DIAGNOSIS:  Encounter Diagnosis  Name Primary?   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) 0/2 lymph nodes)   01/16/2022 Cancer Staging   Staging form: Breast, AJCC 8th Edition - Clinical: Stage IA (cT1c, cN0, cM0, G2, ER+, PR+, HER2-) - Signed by Nicholas Lose, MD on 01/16/2022 Histologic grading system: 3 grade system   01/22/2022 Surgery   Margin reexcision surgery: Benign margins, sentinel lymph node 0/1   01/23/2022 Oncotype testing   Recurrence score: 31 (19% distant recurrence risk at 9 years)   02/11/2022 -  Chemotherapy   Patient is on Treatment Plan : BREAST TC q21d       CHIEF COMPLIANT: estrogen receptor positive breast cancer/ toxicity check  INTERVAL HISTORY: [name] is a 62 year old with above-mentioned history of estrogen receptor positive breast cancer. She presents to the clinic for a follow-up. She states that she felt pretty well after treatment. She had a lot of hot and cold flashes. She said as of yesterday she had more cold flashes. She denies bone pain but shooting pain in feet. She does have at least 4 loose stools a day.  Denies any nausea. She says her lips feels like she has pepper on it. She complains of throat pain, but it has resolved as of today. She does have a rash around the port area.   ALLERGIES:  is allergic to chlorhexidine gluconate, gabapentin, hydrocodone, oxycodone hcl, and betadine [povidone iodine].  MEDICATIONS:  Current Outpatient Medications  Medication Sig Dispense Refill   Adalimumab (HUMIRA, 2 SYRINGE,) 40 MG/0.4ML PSKT Inject 0.4 mLs (40 mg total) under the skin every 14 (fourteen) days. 2 each 5   albuterol (VENTOLIN HFA) 108 (90 Base) MCG/ACT inhaler Inhale 2 puffs into the lungs every 6 (six) hours as needed for wheezing or shortness of breath. 8.5 g 3   ascorbic acid (VITAMIN C) 500 MG tablet Take 500 mg by mouth 2 (two) times a week.     bimatoprost (LUMIGAN) 0.01 % SOLN Instill 1 drop into both eyes at bedtime 2.5 mL 10   busPIRone (BUSPAR) 7.5 MG tablet Take 1 tablet (7.5 mg total) by mouth 2 (two) times daily. 180 tablet 2   calcium carbonate (TUMS) 500 MG chewable tablet Chew 2 tablets (400 mg of elemental calcium total) by mouth 2 (two) times daily. (Patient not taking: Reported on 02/04/2022) 90 tablet 1   celecoxib (CELEBREX) 100 MG capsule Take 1 capsule (100 mg total) by mouth in the morning and at bedtime. (Patient not taking: Reported on 02/04/2022) 60 capsule 11   cetirizine (ZYRTEC) 10 MG tablet Take 10 mg by mouth in the morning.     dexamethasone (DECADRON) 4 MG tablet Take 1 tablet day before chemo  and 1 tablet day after chemo with food. (Patient taking differently: Take 4 mg by mouth See admin instructions. Take 4 mg one day before Chemo and 4 mg the day after chemo) 10 tablet 0   DULoxetine (CYMBALTA) 60 MG capsule Take 1 capsule (60 mg total) by mouth daily. 90 capsule 1   fluocinonide ointment (LIDEX) 6.30 % Apply 1 application  topically daily as needed (irritation).     fluticasone (FLONASE) 50 MCG/ACT nasal spray Place 2 sprays into both nostrils daily. (Patient  not taking: Reported on 02/04/2022) 16 g 1   levothyroxine (SYNTHROID) 50 MCG tablet Take 2 tablets (100 mcg total) by mouth daily before breakfast. 60 tablet 3   lidocaine-prilocaine (EMLA) cream Apply to affected area once 30 g 3   omeprazole (PRILOSEC) 40 MG capsule Take 1 capsule (40 mg total) by mouth daily. 90 capsule 3   ondansetron (ZOFRAN) 8 MG tablet Take 1 tablet (8 mg total) by mouth every 8 (eight) hours as needed for nausea or vomiting. Start on the third day after chemotherapy. 30 tablet 1   oxyCODONE (OXY IR/ROXICODONE) 5 MG immediate release tablet Take 1 tablet (5 mg total) by mouth every 6 (six) hours as needed for severe pain. 15 tablet 0   prochlorperazine (COMPAZINE) 10 MG tablet Take 1 tablet (10 mg total) by mouth every 6 (six) hours as needed for nausea or vomiting. 30 tablet 1   traMADol (ULTRAM) 50 MG tablet Take 1 tablet (50 mg total) by mouth every 6 (six) hours as needed. 20 tablet 0   Vitamin D, Ergocalciferol, (DRISDOL) 1.25 MG (50000 UNIT) CAPS capsule Take 1 capsule (50,000 Units total) by mouth every 7 (seven) days. 12 capsule 0   Current Facility-Administered Medications  Medication Dose Route Frequency Provider Last Rate Last Admin   0.9 %  sodium chloride infusion  500 mL Intravenous Continuous Armbruster, Carlota Raspberry, MD        PHYSICAL EXAMINATION: ECOG PERFORMANCE STATUS: 1 - Symptomatic but completely ambulatory  Vitals:   02/18/22 0955  BP: 114/71  Pulse: 78  Resp: 16  Temp: (!) 97 F (36.1 C)  SpO2: 98%   Filed Weights   02/18/22 0955  Weight: 148 lb 7 oz (67.3 kg)      LABORATORY DATA:  I have reviewed the data as listed    Latest Ref Rng & Units 02/18/2022    9:42 AM 02/11/2022    9:14 AM 01/02/2022    2:27 AM  CMP  Glucose 70 - 99 mg/dL 79  77  139   BUN 8 - 23 mg/dL '12  16  6   '$ Creatinine 0.44 - 1.00 mg/dL 0.68  0.64  0.59   Sodium 135 - 145 mmol/L 136  138  135   Potassium 3.5 - 5.1 mmol/L 3.7  3.4  3.9   Chloride 98 - 111 mmol/L  104  105  104   CO2 22 - 32 mmol/L '28  27  22   '$ Calcium 8.9 - 10.3 mg/dL 9.1  9.6  8.8   Total Protein 6.5 - 8.1 g/dL 6.3  7.0    Total Bilirubin 0.3 - 1.2 mg/dL 0.4  0.3    Alkaline Phos 38 - 126 U/L 52  43    AST 15 - 41 U/L 17  18    ALT 0 - 44 U/L 16  16      Lab Results  Component Value Date   WBC 9.3 02/18/2022  HGB 12.3 02/18/2022   HCT 36.9 02/18/2022   MCV 92.9 02/18/2022   PLT 190 02/18/2022   NEUTROABS 3.1 02/18/2022    ASSESSMENT & PLAN:  Malignant neoplasm of upper-inner quadrant of left breast in female, estrogen receptor positive ([place]) 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  Instructed her to take probiotics  Labs reviewed and they seem to show that she has recovered her blood counts.  Therefore we will keep the dosage of her treatment the same. Return to clinic in 2 weeks for cycle 2    No orders of the defined types were placed in this encounter.  The patient has a good understanding of the overall plan. she agrees with it. she will call with any problems that may develop before the next visit here. Total time spent: 30 mins including  face to face time and time spent for planning, charting and co-ordination of care   Harriette Ohara, MD 02/18/22    I Gardiner Coins am acting as a Education administrator for [employer]  I have reviewed the above documentation for accuracy and completeness, and I agree with the above.

## 2022-02-18 NOTE — Assessment & Plan Note (Signed)
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

## 2022-02-18 NOTE — Progress Notes (Signed)
Patient's port site appears red, slightly warm to touch, patient states she had temp of 100.5 over the weekend.  Dr Lindi Adie informed, port not to be accessed today, patient will have peripheral stick for labs.

## 2022-02-20 ENCOUNTER — Other Ambulatory Visit (HOSPITAL_COMMUNITY): Payer: Self-pay

## 2022-02-20 ENCOUNTER — Telehealth: Payer: Self-pay | Admitting: *Deleted

## 2022-02-20 MED ORDER — AMOXICILLIN 500 MG PO CAPS
2000.0000 mg | ORAL_CAPSULE | Freq: Once | ORAL | 0 refills | Status: AC
  Filled 2022-02-20: qty 4, 1d supply, fill #0

## 2022-02-20 NOTE — Telephone Encounter (Signed)
Received call from pt stating she hurt a tooth a few days ago and has an appt with her DDS this afternoon for further evaluation.  Pt requesting advice from MD if okay to proceed with oral xrays if needed and possible antibiotic if tooth is cracked.  Per MD okay to proceed.  Pt appreciative of advice and states she will update our office on the outcome.

## 2022-02-20 NOTE — Telephone Encounter (Signed)
Received VM from pt stating she does have a cracked tooth and her DDS is sending her to a specialist tomorrow with recommendations of a root canal or temporary crown.  Per MD pt okay to proceed with root canal ASAP prior to next tx on 03/04/22.  Verbal orders received from MD for pt to be prescribed Amoxicillin 2 g p.o the morning of dental surgery.  Prescription sent to pharmacy on file, pt educated and verbalized understanding.

## 2022-02-21 ENCOUNTER — Telehealth: Payer: Self-pay | Admitting: *Deleted

## 2022-02-21 NOTE — Telephone Encounter (Signed)
Received call from pt DDS Dr. Mikey Bussing [phone]) requesting advice from MD as to when pt can proceed with tooth extraction.  Per MD, as soon as possible.  Pt next tx 03/04/22 and pt will need to undergo procedure before then.  Pt was also prescribed Amoxicillin 2 g p.o to take prior to dental procedure.  Dr. Geralynn Ochs verbalized understanding and appreciative of advice.

## 2022-02-22 ENCOUNTER — Encounter: Payer: Self-pay | Admitting: *Deleted

## 2022-02-22 DIAGNOSIS — C50212 Malignant neoplasm of upper-inner quadrant of left female breast: Secondary | ICD-10-CM

## 2022-02-25 ENCOUNTER — Other Ambulatory Visit (HOSPITAL_COMMUNITY): Payer: Self-pay

## 2022-02-25 ENCOUNTER — Ambulatory Visit: Payer: 59 | Attending: Hematology and Oncology | Admitting: Physical Therapy

## 2022-02-25 ENCOUNTER — Encounter: Payer: Self-pay | Admitting: Gastroenterology

## 2022-02-25 ENCOUNTER — Other Ambulatory Visit: Payer: Self-pay | Admitting: Hematology and Oncology

## 2022-02-25 ENCOUNTER — Ambulatory Visit (INDEPENDENT_AMBULATORY_CARE_PROVIDER_SITE_OTHER): Payer: 59 | Admitting: Gastroenterology

## 2022-02-25 ENCOUNTER — Encounter: Payer: Self-pay | Admitting: Physical Therapy

## 2022-02-25 VITALS — BP 130/74 | HR 56 | Ht 62.0 in | Wt 150.4 lb

## 2022-02-25 DIAGNOSIS — K200 Eosinophilic esophagitis: Secondary | ICD-10-CM | POA: Diagnosis not present

## 2022-02-25 DIAGNOSIS — Z483 Aftercare following surgery for neoplasm: Secondary | ICD-10-CM | POA: Diagnosis not present

## 2022-02-25 DIAGNOSIS — R600 Localized edema: Secondary | ICD-10-CM

## 2022-02-25 DIAGNOSIS — Z79899 Other long term (current) drug therapy: Secondary | ICD-10-CM

## 2022-02-25 DIAGNOSIS — R293 Abnormal posture: Secondary | ICD-10-CM | POA: Diagnosis not present

## 2022-02-25 DIAGNOSIS — K5010 Crohn's disease of large intestine without complications: Secondary | ICD-10-CM | POA: Diagnosis not present

## 2022-02-25 DIAGNOSIS — Z170 Estrogen receptor positive status [ER+]: Secondary | ICD-10-CM

## 2022-02-25 DIAGNOSIS — C50212 Malignant neoplasm of upper-inner quadrant of left female breast: Secondary | ICD-10-CM

## 2022-02-25 MED ORDER — OMEPRAZOLE 20 MG PO CPDR
20.0000 mg | DELAYED_RELEASE_CAPSULE | Freq: Every day | ORAL | 3 refills | Status: DC
  Filled 2022-02-25: qty 90, 90d supply, fill #0
  Filled 2022-05-23: qty 90, 90d supply, fill #1
  Filled 2022-08-19: qty 90, 90d supply, fill #2
  Filled 2022-11-17: qty 90, 90d supply, fill #3

## 2022-02-25 MED ORDER — CHLORHEXIDINE GLUCONATE 0.12 % MT SOLN
0.1200 mL | Freq: Two times a day (BID) | OROMUCOSAL | 0 refills | Status: DC
  Filled 2022-02-25: qty 473, 16d supply, fill #0

## 2022-02-25 NOTE — Therapy (Signed)
OUTPATIENT PT/OT UPPER EXTREMITY LYMPHEDEMA EVALUATION  Patient Name: [name] MRN: [id number] DOB:May 09, 1960, 62 y.o., female Today's Date: 02/25/2022  END OF SESSION:  PT End of Session - 02/25/22 1111     Visit Number 3    Number of Visits 15    Date for PT Re-Evaluation 03/25/22    PT Start Time 1110    PT Stop Time 1202    PT Time Calculation (min) 52 min    Activity Tolerance Patient tolerated treatment well    Behavior During Therapy WFL for tasks assessed/performed             Past Medical History:  Diagnosis Date   Anxiety    Allergy induced asthma   Arthritis    Asthma    Breast cancer ([place])    Crohn disease ([place])    Depression    GERD (gastroesophageal reflux disease)    History of methicillin resistant staphylococcus aureus (MRSA)    pt denies this.   Multiple thyroid nodules    Pneumonia    Sleep apnea    CPAP   Thyroid cancer [facility])    Past Surgical History:  Procedure Laterality Date   ANAL FISSURE REPAIR  2004   BREAST LUMPECTOMY WITH RADIOACTIVE SEED LOCALIZATION Left 01/01/2022   Procedure: LEFT BREAST SEED LUMPECTOMY;  Surgeon: Erroll Luna, MD;  Location: [place];  Service: General;  Laterality: Left;   CESAREAN SECTION     COLONOSCOPY     INSERTION OF MESH N/A 07/26/2020   Procedure: INSERTION OF MESH;  Surgeon: Ralene Ok, MD;  Location: [place];  Service: General;  Laterality: N/A;   LIGAMENT REPAIR Right 11/23/2015   Procedure: right index radial collateral LIGAMENT REPAIR;  Surgeon: Leanora Cover, MD;  Location: [place];  Service: Orthopedics;  Laterality: Right;   PORTACATH PLACEMENT Right 02/07/2022   Procedure: INSERTION PORT-A-CATH;  Surgeon: Erroll Luna, MD;  Location: [place];  Service: General;  Laterality: Right;   RE-EXCISION OF BREAST LUMPECTOMY Left 01/22/2022   Procedure: RE-EXCISION OF LEFT BREAST LUMPECTOMY;  Surgeon: Erroll Luna, MD;  Location: [place];  Service: General;   Laterality: Left;   SENTINEL NODE BIOPSY Left 01/22/2022   Procedure: LEFT SENTINEL NODE BIOPSY;  Surgeon: Erroll Luna, MD;  Location: [place];  Service: General;  Laterality: Left;   THYROIDECTOMY N/A 01/01/2022   Procedure: TOTAL THYROIDECTOMY WITH LIMITED LYMPH NODE DISSECTION;  Surgeon: Armandina Gemma, MD;  Location: [place];  Service: General;  Laterality: N/A;   UPPER ENDOSCOPY W/ ESOPHAGEAL MANOMETRY  10/2021   VENTRAL HERNIA REPAIR N/A 07/26/2020   Procedure: LAPAROSCOPIC VENTRAL HERNIA REPAIR WITH MESH;  Surgeon: Ralene Ok, MD;  Location: [place];  Service: General;  Laterality: N/A;   Patient Active Problem List   Diagnosis Date Noted   Malignant neoplasm of upper-inner quadrant of left breast in female, estrogen receptor positive ([place]) 01/09/2022   Postsurgical hypothyroidism 01/04/2022   Multiple thyroid nodules 12/20/2021   Papillary thyroid carcinoma ([place]) 12/20/2021   Nodule of lower lobe of right lung 10/17/2021   Left upper lobe pulmonary nodule 10/17/2021   Acromioclavicular sprain, left, initial encounter 07/27/2021   Tibialis posterior tendinitis, right 06/15/2020   Piriformis syndrome of left side [id number]   Umbilical hernia [id number]   Lumbar radiculopathy 07/09/2019   Contusion of right hip and thigh 04/20/2019   Loss of transverse plantar arch 02/11/2019   Sprain of iliolumbar ligament 10/29/2018   Piriformis syndrome of  right side 09/30/2018   Nonallopathic lesion of sacral region 09/30/2018   Nonallopathic lesion of lumbosacral region 09/30/2018   Nonallopathic lesion of thoracic region 09/30/2018   Peroneal tendinitis of left lower extremity 02/11/2018   Polyarthralgia 02/11/2018   Patellofemoral syndrome of right knee 02/11/2018   Gastroesophageal reflux disease 01/16/2017   Cough variant asthma 01/16/2017   Noncompliance with medication treatment due to intermittent use of medication 01/16/2017   Allergic rhinoconjunctivitis  11/08/2015   Sinobronchitis 10/03/2015   OSA (obstructive sleep apnea) 12/14/2014   Crohn's disease of colon ([place]) 05/24/2014   Depression 02/01/2014   Disorder of intestine 12/17/2010   Anorectal polyp 12/17/2010   Glaucoma 01/14/2010   Dry skin dermatitis 01/15/2008   Crohn's disease ([place]) 01/14/2005    REFERRING PROVIDER: Dr. Nicholas Lose  REFERRING DIAG: Axillary swelling  THERAPY DIAG:  Malignant neoplasm of upper-inner quadrant of left breast in female, estrogen receptor positive ([place])  Aftercare following surgery for neoplasm  Abnormal posture  Localized edema  ONSET DATE: 02/18/2022 for edema increase  Rationale for Evaluation and Treatment: Rehabilitation  SUBJECTIVE                                                                                                                                                                                           SUBJECTIVE STATEMENT: Patient reports she began having swelling in her left axillary region about a week ago. She reports tenderness to palpation but no c/o pain, increased redness, or warmth. She has had 1 cycle of chemotherapy and reports tolerating that well so far except some gastrointestinal issues that have now cleared up.  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 It measures 1.5 cm and is located in the upper inner quadrant with IDC only being present in lateral margin. It is ER/PR positive and HER2 negative with a Ki-67 of 5%. Thyroidectomy was also performed at the same time and 2 lymph nodes removed (nodes were negative) and all gross disease removed. Thyroid cancer will be treated with radioactive iodine.    PAIN:  Are you having pain? Yes NPRS scale: 3/10 Pain location: left axilla Pain orientation: Left  PAIN TYPE: tender Pain description: intermittent  Aggravating factors: touching the area  Relieving factors:  nothing  PRECAUTIONS: Other: Currently undergoing chemotherapy  WEIGHT BEARING RESTRICTIONS: No  FALLS:  Has patient fallen in last 6 months? No  LIVING ENVIRONMENT: Lives with: lives with their family Lives in: House/apartment Has following equipment at home: None  OCCUPATION: Retired  LEISURE: She is walking an hour 5x/week  HAND DOMINANCE :  right   PRIOR LEVEL OF FUNCTION: Independent  PATIENT GOALS: Reduce axillary edema for improved comfort   OBJECTIVE  COGNITION:  Overall cognitive status: Within functional limits for tasks assessed   PALPATION: Visible and palpable pocket of fluid present just superior to her axillary incision. It appears to be an organized seroma with thick fluid present. She also has 2 visible and palpable axillary cords present (see photo)    OBSERVATIONS / OTHER ASSESSMENTS: Positive upper limb tension testing likely due to axillary cording.  SENSATION: Light touch: Appears intact Stereognosis: Appears intact Hot/Cold: Appears intact Proprioception: Appears intact  POSTURE: Forward head, rounded shoulders   HAND DOMINANCE: Right  UPPER EXTREMITY AROM/PROM: All UE bilateral AROM is WNL   UPPER EXTREMITY STRENGTH: Functional  TODAY'S TREATMENT:                                                                                                                                         DATE: 02/25/2022 Performed abbreviated manual lymph drainage focused on left axillary region: Short neck, right axillary and left inguinal region stimulated lymph nodes; anterior inter-axillary and left axillo-inguinal pathway; left lateral breast and axillary region redirecting along pathways. Pt in supine and draped appropriately with towel. Myofascial release to left axillary region where axillary cords are present. Passive prolonged stretch to left shoulder flexion and abduction in supine to pt tolerance to regain tissue release at end ROM and improve  cording. Created and applied a chip pack to her left axillary region to break up thick edema. Also applied 12 cm Comprilan compression bandage around her chest (instructed her to doff bandage after 3 hours) to hold chip pack in place and to apply compression to the chip pack for improved effect.  PATIENT EDUCATION:  Education details: Instructed pt to wear her chip pack in her left axilla as tolerated to see if it reduced edema Person educated: Patient Education method: Explanation Education comprehension: verbalized understanding  HOME EXERCISE PROGRAM: None issued today  ASSESSMENT:  CLINICAL IMPRESSION: Patient is a 62 y.o. woman who was seen today for physical therapy evaluation and treatment for left axillary edema and cording. She began chemotherapy 2 weeks ago for ER/PR positive and HER2 negative grade II invasive ductal carcinoma left breast cancer. She recovered well from her left lumpectomy and sentinel node biopsy (1 negative node) but this pocket of fluid popped up 1 week ago after wearing her normal bra. She has since returned to her post op bra and is wearing compression foam in her left axillary region (which was replaced with a chip pack today). She did appear to have reduced tissue thickness and tightness after manual therapy but she reported not noticing a significant difference. She will benefit from PT to reduce swelling, reduce cording, and return to baseline.  OBJECTIVE IMPAIRMENTS: increased edema and increased fascial restrictions.   ACTIVITY LIMITATIONS:  Not functionally limited; edema  and cording increase her risk for lymphedema  and needs to be addressed a resolved.  PARTICIPATION LIMITATIONS:  none  PERSONAL FACTORS: 1 comorbidity: Currently undergoing chemotherapy  is also affecting patient's functional outcome.   REHAB POTENTIAL: Excellent  CLINICAL DECISION MAKING: Stable/uncomplicated  EVALUATION COMPLEXITY: Low  GOALS: Goals reviewed with patient?  Yes  LONG TERM GOALS: Target date: 03/25/2022    Patient will demonstrate her swelling in her left axilla has resolved so that she can wear a normal bra.  Goal status: INITIAL  2.  Patient will demonstrate proper scar massage and manual lymph drainage techniques for improving edema.  Goal status: INITIAL  3.  Patient will demonstrate axillary cording has resolved so there is no tightness present at end ROM for left shoulder.   Goal status: INITIAL  PLAN:  PT FREQUENCY: 3x/week  PT DURATION: 4 weeks  PLANNED INTERVENTIONS: Therapeutic exercises, Therapeutic activity, Patient/Family education, Self Care, Manual lymph drainage, Compression bandaging, scar mobilization, Manual therapy, and Re-evaluation  PLAN FOR NEXT SESSION: Address cording with myofascial release; manual lymph drainage and scar mobilization to left axillary seroma region.  Annia Friendly, [place] 02/25/22 12:55 PM

## 2022-02-25 NOTE — Progress Notes (Signed)
HPI :  62 year old female with a history of colonic Crohn's disease, GERD, newly diagnosed EoE, here for a follow up visit.   Crohn's history:  She has been followed by [facility] IBD clinic for her Crohn's disease for the past several years.  Crohn's history obtained from the patient and reviewing her extensive records at [facility].  In summary she was diagnosed with Crohn's disease in 2001.  Index symptoms appear to be abdominal pain, loose stools with blood and mucus.  Over the years she has been on a variety of regimens to include Remicade, thiopurine's, Humira, methotrexate, mesalamine, Flagyl.  She has colonic Crohn's involvement with reported history of perianal abscess without fistula in the past.  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   She has been on and off thiopurine's methotrexate in years past.  Currently on Humira every 2 weeks and states she has been on a stable dosing of this for the past few years.  Generally she states her Crohn's has been pretty well controlled on this regimen.  At baseline her bowels are fairly regular, however at times she has occasional loose stools which are sporadic.  No blood in her stool, no mucus.  Her last colonoscopy was performed in December 2021 showing no endoscopic inflammation, biopsies showed no evidence of microscopic colitis.  She had been on Colestid in the past for loose stools, she is not on that currently.  She was told to have another colonoscopy in 12/2022.  Her vaccines for flu, COVID, pneumococcus, Shingrix are all up-to-date.  SINCE LAST VISIT  Recall I saw her back in October 2023.  She had endorsed a history of reflux over the years, was on some Pepcid but had some breakthrough.  She has had some intermittent dysphagia for years and throat soreness.  She also had a PET scan which showed some symmetric distal wall thickening.  She underwent an EGD with me in October.  She had mild LA grade a esophagitis with a GEJ stenosis that was treated with  balloon dilation.  Also had some subtle changes in her esophagus concerning for EOE.  As below, pathology was consistent with eosinophilic esophagitis.  She was started on omeprazole 40 mg daily which she has been taking since the procedure.  She was also referred to an allergist.  She had extensive food allergy testing which was negative.  She states post dilation and on omeprazole her symptoms have essentially resolved.  She does not really have any problems with reflux, her throat soreness has mostly resolved.  Her dysphagia has resolved.  She is quite happy with how she is doing in that regard.  We discussed long-term management of EOE and long-term risk benefits of chronic PPI. She denies any history of osteoporosis or chronic kidney disease.  She denies any cardiopulmonary symptoms.  DEXA scan in 2022 was normal.   Unfortunately since have seen her, she was diagnosed with breast cancer, followed by Dr. Payton Mccallum of the hematology oncology clinic.  She is status post surgery for this and has just started chemotherapy.  Her schedule is for 4 cycles of chemotherapy with Taxotere and Cytoxan.  She is going to then have radiation therapy.  She states she has continued her Humira and took a dose of it last week, she does not think she was ever told to stop it.  She does not have any problems with her bowels or active Crohn's symptoms.  We did a fecal calprotectin since her last office visit which  was undetectable.  We discussed plan for treatment of her Crohn's  Endoscopy history:  Colonoscopy 12/29/19: Skin tags were found on perianal exam. The terminal ileum appeared normal. Normal mucosa was found in the entire colon. Biopsies for histology were  taken with a cold forceps from the right colon and left colon for  evaluation of microscopic colitis. Many small and large-mouthed diverticula were found in the entire colon. Scattered probable pseudopolyps were found in the transverse colon,  ascending colon  and cecum. The polyps were 1 to 2 mm in size. These  polyps were removed with a jumbo cold forceps. Resection and retrieval  were complete. Internal hemorrhoids were found during retroflexion.  A: Right colon, biopsy - Colonic mucosa with focal hyperplastic change - No significant active inflammation - No evidence of dysplasia   B: Colon, random, biopsy - Melanosis coli - No significant active inflammation - No evidence of dysplasia    Colon 11/07/17: One 12 mm polyp in the sigmoid colon, removed with a hot snare. Resected and retrieved. Pseudopolyps at the hepatic flexure. Biopsied. Erythematous mucosa in the sigmoid colon c/w scar. Biopsied. No active disease seen. Moderate diverticulosis in the sigmoid colon, in the descending colon, in the transverse colon and in the ascending colon. There was no evidence of diverticular bleeding.  A: Colon, transverse, biopsy - Sessile serrated polyp (2 fragments); negative for dysplasia - Lymphoid nodule (1 fragment) - No CMV viral cytopathic effect or granuloma identified   B: Colon, sigmoid, biopsy - Unremarkable colonic mucosa (2 fragments) - No CMV viral cytopathic effect, granuloma, or dysplasia identified  EGD 07/12/15: Normal esophagus, stomach, and duodenum.   Colon 07/12/15: Pseudopolyps in the transverse colon. Resected and retrieved. Diverticulosis in the sigmoid colon, in the descending colon, in the transverse colon and in the ascending colon. Increased mucosa vascular pattern in the rectum. Biopsied.  A: Colon, ascending, transverse, biopsy - Colonic mucosa with mild hyperplastic epithelial changes, negative for dysplasia - No active inflammation, CMV cytopathic effect or granulomas identified   B: Colon, random, left, biopsy - Colonic mucosa with no specific pathologic abnormality, negative for dysplasia - No significant crypt architectural distortion or active inflammation seen - No CMV cytopathic effect or granulomas  identified  Colon 12/17/10:  One 12 mm polyp in the rectum. Resected and retrieved. Erythematous mucosa in the sigmoid colon and in the descending colon. The splenic flexure, transverse colon, hepatic flexure, ascending colon, cecum and appendiceal orifice are normal with the exception of a few scattered diverticulae. Two biopsies were taken every 10 cm from the right colon and left colon.   A:  Colon, sigmoid and rectum, biopsy  -  Inflammatory pseudopolyp arising in chronic inflammatory bowel disease with  focal low grade dysplasia (see comment)   B:  Colon, right, biopsy  -  No significant pathologic abnormality  -  No inflammatory bowel disease or dysplasia identified   C:  Colon, left, biopsy  -  No significant pathologic abnormality  -  No inflammatory bowel disease or dysplasia identified        PET scan 10/29/21: IMPRESSION: 1. Minimally metabolic 17 mm mixed density right lower lobe pulmonary nodule may reflect an infectious or inflammatory etiology, but given its morphology a very low-grade primary bronchogenic adenocarcinoma remains a pertinent differential consideration. Consider referral to multidisciplinary tumor board and dedicated chest CT surveillance imaging or versus direct tissue sampling. 2. Hypermetabolic heterogeneous 19 mm right thyroid nodule, suggest further evaluation with thyroid ultrasound and FNA.  3. Hypermetabolic mild symmetric distal esophageal wall thickening with a max SUV of 4.3, possibly reflecting esophagitis. Consider further evaluation with endoscopy. 4. Hypermetabolic hyperplasia of the tonsils slightly asymmetric to the left, nonspecific but commonly reactive. Consider correlation with direct visualization. 5. Focus of hypermetabolic activity about the left acromioclavicular joint without underlying osseous lesion identified, favored inflammatory. 6. Minimally metabolic right posterior gluteal soft tissue nodularity likely reflects  sequela of subcutaneous injections or prior trauma. 7. Colonic diverticulosis without findings of acute diverticulitis. 8. Nodular uterine contour likely reflects leiomyomas.     Fecal calprotectin 11/09/21:   Quantiferon gold 11/02/21 - negative   EGD 11/09/21:  - Esophagogastric landmarks identified. - LA Grade A reflux esophagitis. - Benign-appearing esophageal stenosis. Dilated to 32m with good result. - Esophageal mucosal changes suspicious for possible eosinophilic esophagitis vs. reflux changes. - Normal stomach. - Normal examined duodenum.  Surgical [P], esophageal biopsies MORPHOLOGIC FEATURES CONSISTENT WITH EOSINOPHILIC ESOPHAGITIS. AVERAGE NUMBER OF EOSINOPHILS PER HIGH-POWERED FIELD: MORE THAN 20. NEGATIVE FOR DYSPLASIA AND MALIGNANCY. COMMENT: THE DIFFERENTIAL DIAGNOSIS INCLUDES PPI INDUCED EOSINOPHILIA.  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) 0/2 lymph nodes) 01/22/2022: Margin reexcision: Benign, /1 sentinel lymph node negative 01/23/2022: Oncotype DX recurrence score 31 (19% risk of distant recurrence at 9 years)   Treatment plan: Adjuvant chemotherapy with Taxotere and Cytoxan every 3 weeks x 4 Adjuvant radiation therapy Adjuvant antiestrogen therapy ----------------------------------------------------------------------- Current treatment: Cycle 1 day 8 Taxotere and Cytoxan     Past Medical History:  Diagnosis Date   Anxiety    Allergy induced asthma   Arthritis    Asthma    [place] [place] [facility]     [place] disease ([facility]    Depression    GERD (gastroesophageal reflux disease)    History of methicillin resistant staphylococcus aureus (MRSA)    pt denies this.   Multiple thyroid nodules    Pneumonia    Sleep apnea    CPAP   Thyroid cancer ([facility]      Past Surgical History:  Procedure Laterality Date   ANAL FISSURE REPAIR  2004   BREAST LUMPECTOMY WITH RADIOACTIVE SEED LOCALIZATION Left 01/01/2022   Procedure: LEFT BREAST SEED LUMPECTOMY;  Surgeon: CErroll Luna MD;  [place]  Service: General;  Laterality: Left;   CESAREAN SECTION     COLONOSCOPY     INSERTION OF MESH N/A 07/26/2020   Procedure: INSERTION OF MESH;  Surgeon: RRalene Ok MD;  [place]  Service: General;  Laterality: N/A;   LIGAMENT REPAIR Right 11/23/2015   Procedure: right index radial collateral LIGAMENT REPAIR;  Surgeon: KLeanora Cover MD;  [place] [facility]  Service: Orthopedics;  Laterality: Right;   PORTACATH PLACEMENT Right 02/07/2022   Procedure: INSERTION PORT-A-CATH;  Surgeon: CErroll Luna MD;  [place]  Service: General;  Laterality: Right;   RE-EXCISION OF BREAST LUMPECTOMY Left 01/22/2022   Procedure: RE-EXCISION OF LEFT BREAST LUMPECTOMY;  Surgeon: CErroll Luna MD;  [place]  Service: General;  Laterality: Left;   SENTINEL NODE BIOPSY Left 01/22/2022  Procedure: LEFT SENTINEL NODE BIOPSY;  Surgeon: Erroll Luna, MD;  Location: [place];  Service: General;  Laterality: Left;   THYROIDECTOMY N/A 01/01/2022   Procedure: TOTAL THYROIDECTOMY WITH LIMITED LYMPH NODE DISSECTION;  Surgeon: Armandina Gemma, MD;  Location: [place];  Service: General;  Laterality: N/A;   UPPER ENDOSCOPY W/ ESOPHAGEAL MANOMETRY  10/2021   VENTRAL HERNIA REPAIR N/A 07/26/2020   Procedure: LAPAROSCOPIC VENTRAL HERNIA REPAIR WITH MESH;  Surgeon: Ralene Ok, MD;  Location: [place];  Service: General;  Laterality: N/A;   Family  History  Problem Relation Age of Onset   Alcohol abuse Mother    Alcohol abuse Sister    Angioedema Neg Hx    Allergic rhinitis Neg Hx    Asthma Neg Hx    Atopy Neg Hx    Eczema Neg Hx    Immunodeficiency Neg Hx    Urticaria Neg Hx    Colon cancer Neg Hx    Stomach cancer Neg Hx    Esophageal cancer Neg Hx    Colon polyps Neg Hx    Social History   Tobacco Use   Smoking status: Never    Passive exposure: Never   Smokeless tobacco: Never  Vaping Use   Vaping Use: Never used  Substance Use Topics   Alcohol use: Yes    Alcohol/week: 10.0 standard drinks of alcohol    Types: 10 Glasses of wine per week    Comment: 10 glasses/week   Drug use: No   Current Outpatient Medications  Medication Sig Dispense Refill   Adalimumab (HUMIRA, 2 SYRINGE,) 40 MG/0.4ML PSKT Inject 0.4 mLs (40 mg total) under the skin every 14 (fourteen) days. 2 each 5   albuterol (VENTOLIN HFA) 108 (90 Base) MCG/ACT inhaler Inhale 2 puffs into the lungs every 6 (six) hours as needed for wheezing or shortness of breath. 8.5 g 3   ascorbic acid (VITAMIN C) 500 MG tablet Take 500 mg by mouth 2 (two) times a week.     bimatoprost (LUMIGAN) 0.01 % SOLN Instill 1 drop into both eyes at bedtime 2.5 mL 10   busPIRone (BUSPAR) 7.5 MG tablet Take 1 tablet (7.5 mg total) by mouth 2 (two) times daily. 180 tablet 2   cetirizine (ZYRTEC) 10 MG tablet Take 10 mg by mouth in the morning.     dexamethasone (DECADRON) 4 MG tablet Take 1 tablet day before chemo and 1 tablet day after chemo with food. (Patient taking differently: Take 4 mg by mouth See admin instructions. Take 4 mg one day before Chemo and 4 mg the day after chemo) 10 tablet 0   DULoxetine (CYMBALTA) 60 MG capsule Take 1 capsule (60 mg total) by mouth daily. 90 capsule 1   fluocinonide ointment (LIDEX) [id number] % Apply 1 application  topically daily as needed (irritation).     levothyroxine (SYNTHROID) 50 MCG tablet Take 2 tablets (100 mcg total) by mouth daily before  breakfast. 60 tablet 3   lidocaine-prilocaine (EMLA) cream Apply to affected area once 30 g 3   omeprazole (PRILOSEC) 40 MG capsule Take 1 capsule (40 mg total) by mouth daily. 90 capsule 3   ondansetron (ZOFRAN) 8 MG tablet Take 1 tablet (8 mg total) by mouth every 8 (eight) hours as needed for nausea or vomiting. Start on the third day after chemotherapy. 30 tablet 1   prochlorperazine (COMPAZINE) 10 MG tablet Take 1 tablet (10 mg total) by mouth every 6 (six) hours as needed for nausea  or vomiting. 30 tablet 1   Vitamin D, Ergocalciferol, (DRISDOL) 1.25 MG (50000 UNIT) CAPS capsule Take 1 capsule (50,000 Units total) by mouth every 7 (seven) days. 12 capsule 0   celecoxib (CELEBREX) 100 MG capsule Take 1 capsule (100 mg total) by mouth in the morning and at bedtime. (Patient not taking: Reported on 02/04/2022) 60 capsule 11   Current Facility-Administered Medications  Medication Dose Route Frequency Provider Last Rate Last Admin   0.9 %  sodium chloride infusion  500 mL Intravenous Continuous Jaiden Wahab, Carlota Raspberry, MD       Allergies  Allergen Reactions   Chlorhexidine Gluconate Itching   Gabapentin Itching   Hydrocodone Itching   Oxycodone Hcl Itching    NDC VN:823368  NDC VN:823368   Betadine [Povidone Iodine] Rash     Review of Systems: All systems reviewed and negative except where noted in HPI.   Lab Results  Component Value Date   CREATININE 0.68 02/18/2022   BUN 12 02/18/2022   NA 136 02/18/2022   K 3.7 02/18/2022   CL 104 02/18/2022   CO2 28 02/18/2022    Lab Results  Component Value Date   ALT 16 02/18/2022   AST 17 02/18/2022   ALKPHOS 52 02/18/2022   BILITOT 0.4 02/18/2022    Lab Results  Component Value Date   WBC 9.3 02/18/2022   HGB 12.3 02/18/2022   HCT 36.9 02/18/2022   MCV 92.9 02/18/2022   PLT 190 02/18/2022     Physical Exam: BP 130/74   Pulse (!) 56   Ht 5' 2"$  (1.575 m)   Wt 150 lb 6.4 oz (68.2 kg)   LMP 01/10/2014   SpO2  97%   BMI 27.51 kg/m  Constitutional: Pleasant,well-developed, female in no acute distress. Neurological: Alert and oriented to person place and time. Psychiatric: Normal mood and affect. Behavior is normal.   ASSESSMENT: 62 y.o. female here for assessment of the following  1. Eosinophilic esophagitis   2. Crohn's disease of colon without complication ([place])   3. High risk medication use   4. Malignant neoplasm of upper-inner quadrant of left breast in female, estrogen receptor positive (HCC)    Longstanding intermittent dysphagia, GERD, sore throat.  EGD revealed a diagnosis of EOE, distal esophageal stricture managed with balloon dilation.  Biopsies showed EOE.  Referred to allergy, negative food allergy testing.  Fortunately with dilation and omeprazole alone her symptoms have essentially resolved.  We discussed EOE, risks for recurrent symptoms, recurrent stricturing etc.  Hopefully on PPI she does well and does not need any other intervention.  She had no clear food allergies.  Would not restrict her diet at this time if she is otherwise feeling well on PPI.  We did discuss long-term risks of chronic PPI use.  She has normal renal function, no fracture history, her bone health is good.  We will try to reduce her dose of omeprazole to 20 mg daily to see if she tolerates that and if it holds her.  If not she can increase to 40 mg daily.  Otherwise, very unfortunate she was diagnosed with breast cancer since have last seen her, currently on chemotherapy which she just started, as well as thyroid cancer.  I reviewed her Crohn's disease with her, I do not think she should be on Humira right now in the setting of chemotherapy and recommend she stop it.  I will touch base with her oncologist to make sure they are in agreement with this.  I would hold off on Humira while she is on active chemotherapy and will discussed timing of when to resume this pending her course.  I would like to see her back in  the office in May to discuss this again.  If she has any flares of Crohn's disease while off Humira she should contact me although I think risk of that will be low on chemotherapy.  She agrees with the plan as outlined.   PLAN: - reduce omeprazole to 5m / day (she will finish course of 428m. Discussed long term risks / benefits of chronic PPI use, she understands.  - contact me if recurrent symptoms - can consider oral steroids vs. dietary therapy. Has seen allergist - hold Humira for now while on chemotherapy, will relay to Oncology  - f/u me in May post chemotherapy to discuss timing of resumption of biologic therapy  StJolly MangoMD [facility]

## 2022-02-25 NOTE — Patient Instructions (Addendum)
_______________________________________________________  If your blood pressure at your visit was 140/90 or greater, please contact your primary care physician to follow up on this.  _______________________________________________________  If you are age 62 or older, your body mass index should be between 23-30. Your Body mass index is 27.51 kg/m. If this is out of the aforementioned range listed, please consider follow up with your Primary Care Provider.  If you are age 28 or younger, your body mass index should be between 19-25. Your Body mass index is 27.51 kg/m. If this is out of the aformentioned range listed, please consider follow up with your Primary Care Provider.   ________________________________________________________  The [place] [facility] providers would like to encourage you to use [facility] to communicate with providers for non-urgent requests or questions.  Due to long hold times on the telephone, sending your provider a message by [facility] may be a faster and more efficient way to get a response.  Please allow 48 business hours for a response.  Please remember that this is for non-urgent requests.  _______________________________________________________  Decrease omeprazole to 20 mg once daily. We have sent a refill to your pharmacy.  Hold Humira.  You have been scheduled for a follow up appointment with Dr. Havery Moros on Wednesday, May 1st at 9:00am . Please arrive 10 minutes early for registration.   Thank you for entrusting me with your care and for choosing [facility] Dr. Erma Cellar

## 2022-02-26 ENCOUNTER — Other Ambulatory Visit: Payer: Self-pay

## 2022-02-27 ENCOUNTER — Ambulatory Visit: Payer: 59

## 2022-02-27 DIAGNOSIS — Z483 Aftercare following surgery for neoplasm: Secondary | ICD-10-CM

## 2022-02-27 DIAGNOSIS — Z170 Estrogen receptor positive status [ER+]: Secondary | ICD-10-CM

## 2022-02-27 DIAGNOSIS — R600 Localized edema: Secondary | ICD-10-CM

## 2022-02-27 DIAGNOSIS — R293 Abnormal posture: Secondary | ICD-10-CM

## 2022-02-27 DIAGNOSIS — C50212 Malignant neoplasm of upper-inner quadrant of left female breast: Secondary | ICD-10-CM | POA: Diagnosis not present

## 2022-02-27 NOTE — Therapy (Signed)
OUTPATIENT PT/OT UPPER EXTREMITY LYMPHEDEMA TREATMENT  Patient Name: [name] MRN: [id number] DOB:[birth year], 62 y.o., female Today's Date: 02/27/2022  END OF SESSION:  PT End of Session - 02/27/22 0902     Visit Number 4    Number of Visits 15    Date for PT Re-Evaluation 03/25/22    PT Start Time 0902    PT Stop Time 0958    PT Time Calculation (min) 56 min    Activity Tolerance Patient tolerated treatment well    Behavior During Therapy [facility] for tasks assessed/performed             Past Medical History:  Diagnosis Date   Anxiety    Allergy induced asthma   Arthritis    Asthma    Breast cancer ([place])    Crohn disease ([place])    Depression    GERD (gastroesophageal reflux disease)    History of methicillin resistant staphylococcus aureus (MRSA)    pt denies this.   Multiple thyroid nodules    Pneumonia    Sleep apnea    CPAP   Thyroid cancer [facility])    Past Surgical History:  Procedure Laterality Date   ANAL FISSURE REPAIR  2004   BREAST LUMPECTOMY WITH RADIOACTIVE SEED LOCALIZATION Left 01/01/2022   Procedure: LEFT BREAST SEED LUMPECTOMY;  Surgeon: Erroll Luna, MD;  Location: [place];  Service: General;  Laterality: Left;   CESAREAN SECTION     COLONOSCOPY     INSERTION OF MESH N/A 07/26/2020   Procedure: INSERTION OF MESH;  Surgeon: Ralene Ok, MD;  Location: [place];  Service: General;  Laterality: N/A;   LIGAMENT REPAIR Right 11/23/2015   Procedure: right index radial collateral LIGAMENT REPAIR;  Surgeon: Leanora Cover, MD;  Location: [place];  Service: Orthopedics;  Laterality: Right;   PORTACATH PLACEMENT Right 02/07/2022   Procedure: INSERTION PORT-A-CATH;  Surgeon: Erroll Luna, MD;  Location: [place];  Service: General;  Laterality: Right;   RE-EXCISION OF BREAST LUMPECTOMY Left 01/22/2022   Procedure: RE-EXCISION OF LEFT BREAST LUMPECTOMY;  Surgeon: Erroll Luna, MD;  Location: [place];  Service: General;   Laterality: Left;   SENTINEL NODE BIOPSY Left 01/22/2022   Procedure: LEFT SENTINEL NODE BIOPSY;  Surgeon: Erroll Luna, MD;  Location: [place];  Service: General;  Laterality: Left;   THYROIDECTOMY N/A 01/01/2022   Procedure: TOTAL THYROIDECTOMY WITH LIMITED LYMPH NODE DISSECTION;  Surgeon: Armandina Gemma, MD;  Location: [place];  Service: General;  Laterality: N/A;   UPPER ENDOSCOPY W/ ESOPHAGEAL MANOMETRY  10/2021   VENTRAL HERNIA REPAIR N/A 07/26/2020   Procedure: LAPAROSCOPIC VENTRAL HERNIA REPAIR WITH MESH;  Surgeon: Ralene Ok, MD;  Location: [place];  Service: General;  Laterality: N/A;   Patient Active Problem List   Diagnosis Date Noted   Malignant neoplasm of upper-inner quadrant of left breast in female, estrogen receptor positive ([place]) 01/09/2022   Postsurgical hypothyroidism 01/04/2022   Multiple thyroid nodules 12/20/2021   Papillary thyroid carcinoma ([place]) 12/20/2021   Nodule of lower lobe of right lung 10/17/2021   Left upper lobe pulmonary nodule 10/17/2021   Acromioclavicular sprain, left, initial encounter 07/27/2021   Tibialis posterior tendinitis, right 06/15/2020   Piriformis syndrome of left side [id number]   Umbilical hernia [id number]   Lumbar radiculopathy 07/09/2019   Contusion of right hip and thigh 04/20/2019   Loss of transverse plantar arch 02/11/2019   Sprain of iliolumbar ligament 10/29/2018   Piriformis syndrome of  right side 09/30/2018   Nonallopathic lesion of sacral region 09/30/2018   Nonallopathic lesion of lumbosacral region 09/30/2018   Nonallopathic lesion of thoracic region 09/30/2018   Peroneal tendinitis of left lower extremity 02/11/2018   Polyarthralgia 02/11/2018   Patellofemoral syndrome of right knee 02/11/2018   Gastroesophageal reflux disease 01/16/2017   Cough variant asthma 01/16/2017   Noncompliance with medication treatment due to intermittent use of medication 01/16/2017   Allergic rhinoconjunctivitis  11/08/2015   Sinobronchitis 10/03/2015   OSA (obstructive sleep apnea) 12/14/2014   Crohn's disease of colon ([place]) 05/24/2014   Depression 02/01/2014   Disorder of intestine 12/17/2010   Anorectal polyp 12/17/2010   Glaucoma 01/14/2010   Dry skin dermatitis 01/15/2008   Crohn's disease ([place]) 01/14/2005    REFERRING PROVIDER: Dr. Nicholas Lose  REFERRING DIAG: Axillary swelling  THERAPY DIAG:  Malignant neoplasm of upper-inner quadrant of left breast in female, estrogen receptor positive ([place])  Aftercare following surgery for neoplasm  Abnormal posture  Localized edema  ONSET DATE: 02/18/2022 for edema increase  Rationale for Evaluation and Treatment: Rehabilitation  SUBJECTIVE                                                                                                                                                                                           SUBJECTIVE STATEMENT: I've been wearing the chip pack Inez Catalina gave me and it has really helped alleviate the tenderness and size of the seroma.   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 It measures 1.5 cm and is located in the upper inner quadrant with IDC only being present in lateral margin. It is ER/PR positive and HER2 negative with a Ki-67 of 5%. Thyroidectomy was also performed at the same time and 2 lymph nodes removed (nodes were negative) and all gross disease removed. Thyroid cancer will be treated with radioactive iodine.    PAIN:  Are you having pain? No, not currently  PRECAUTIONS: Other: Currently undergoing chemotherapy  WEIGHT BEARING RESTRICTIONS: No  FALLS:  Has patient fallen in last 6 months? No  LIVING ENVIRONMENT: Lives with: lives with their family Lives in: House/apartment Has following equipment at home: None  OCCUPATION: Retired  LEISURE: She is walking an hour 5x/week  HAND DOMINANCE : right    PRIOR LEVEL OF FUNCTION: Independent  PATIENT GOALS: Reduce axillary edema for improved comfort   OBJECTIVE  COGNITION:  Overall cognitive status: Within functional limits for tasks assessed   PALPATION: Visible and palpable pocket of fluid present just superior to her axillary incision. It appears to be an  organized seroma with thick fluid present. She also has 2 visible and palpable axillary cords present (see photo) First photo 02/25/22       Second photo on 02/27/22    OBSERVATIONS / OTHER ASSESSMENTS: Positive upper limb tension testing likely due to axillary cording.  SENSATION: Light touch: Appears intact Stereognosis: Appears intact Hot/Cold: Appears intact Proprioception: Appears intact  POSTURE: Forward head, rounded shoulders   HAND DOMINANCE: Right  UPPER EXTREMITY AROM/PROM: All UE bilateral AROM is WNL   UPPER EXTREMITY STRENGTH: Functional  TODAY'S TREATMENT:                                                                                                                                         DATE:  02/27/22: Manual Therapy MLD: Short neck, superficial and deep abdominals, Lt inguinal and Rt axillary and pectoral nodes, Lt axillo-inguinal and anterior inter-axillary anastomosis, then Lt breast and Lt upper arm (lateral, medial to lateral and lateral again) to promote lymphatic drainage, especially as seroma has reduced so much. This was done throughout [facility] MFR: To Lt axilla where tightness from cording palpable but barely visible today, also into upper arm where tightness palpated P/ROM: In Supine during MFR to Lt shoulder into flexion and abd to pts available end motions  02/25/2022 Performed abbreviated manual lymph drainage focused on left axillary region: Short neck, right axillary and left inguinal region stimulated lymph nodes; anterior inter-axillary and left axillo-inguinal pathway; left lateral breast and axillary region redirecting along pathways. Pt  in supine and draped appropriately with towel. Myofascial release to left axillary region where axillary cords are present. Passive prolonged stretch to left shoulder flexion and abduction in supine to pt tolerance to regain tissue release at end ROM and improve cording. Created and applied a chip pack to her left axillary region to break up thick edema. Also applied 12 cm Comprilan compression bandage around her chest (instructed her to doff bandage after 3 hours) to hold chip pack in place and to apply compression to the chip pack for improved effect.  PATIENT EDUCATION:  Education details: Instructed pt to wear her chip pack in her left axilla as tolerated to see if it reduced edema Person educated: Patient Education method: Explanation Education comprehension: verbalized understanding  HOME EXERCISE PROGRAM: None issued today  ASSESSMENT:  CLINICAL IMPRESSION: Pt much improved since 2 days ago, see pictures above for before and after. She has been wearing the chip pack and reports seroma much smaller. Continued with manual therapies began at eval. Cording was mildly palpable and barely visible today, especially when compared to last session picture.   OBJECTIVE IMPAIRMENTS: increased edema and increased fascial restrictions.   ACTIVITY LIMITATIONS:  Not functionally limited; edema and cording increase her risk for lymphedema  and needs to be addressed a resolved.  PARTICIPATION LIMITATIONS:  none  PERSONAL FACTORS: 1 comorbidity: Currently undergoing chemotherapy  is also  affecting patient's functional outcome.   REHAB POTENTIAL: Excellent  CLINICAL DECISION MAKING: Stable/uncomplicated  EVALUATION COMPLEXITY: Low  GOALS: Goals reviewed with patient? Yes  LONG TERM GOALS: Target date: 03/25/2022    Patient will demonstrate her swelling in her left axilla has resolved so that she can wear a normal bra.  Goal status: INITIAL  2.  Patient will demonstrate proper scar massage  and manual lymph drainage techniques for improving edema.  Goal status: INITIAL  3.  Patient will demonstrate axillary cording has resolved so there is no tightness present at end ROM for left shoulder.   Goal status: INITIAL  PLAN:  PT FREQUENCY: 3x/week  PT DURATION: 4 weeks  PLANNED INTERVENTIONS: Therapeutic exercises, Therapeutic activity, Patient/Family education, Self Care, Manual lymph drainage, Compression bandaging, scar mobilization, Manual therapy, and Re-evaluation  PLAN FOR NEXT SESSION: Add AA/ROM like pulleys and ball roll up wall; Cont to address cording with myofascial release; manual lymph drainage and scar mobilization to left axillary seroma region.   Collie Siad, PTA 02/27/22 11:18 AM;

## 2022-02-28 ENCOUNTER — Ambulatory Visit: Payer: 59 | Admitting: Allergy & Immunology

## 2022-02-28 ENCOUNTER — Other Ambulatory Visit (HOSPITAL_COMMUNITY): Payer: Self-pay

## 2022-02-28 ENCOUNTER — Ambulatory Visit: Payer: 59

## 2022-03-01 ENCOUNTER — Telehealth: Payer: Self-pay | Admitting: *Deleted

## 2022-03-01 MED FILL — Dexamethasone Sodium Phosphate Inj 100 MG/10ML: INTRAMUSCULAR | Qty: 1 | Status: AC

## 2022-03-01 MED FILL — Fosaprepitant Dimeglumine For IV Infusion 150 MG (Base Eq): INTRAVENOUS | Qty: 5 | Status: AC

## 2022-03-01 NOTE — Telephone Encounter (Signed)
Received call from pt with complaint of head cold symptoms.  Pt states she is experiencing nasal congestion, post nasal drip, and cough.  Pt states mucus is clear at this time and covid test has been negative.  Per MD pt to take OTC Mucinex DM and Robitussin to help with symptoms. Pt educated and verbalized understanding.

## 2022-03-01 NOTE — Progress Notes (Shared)
Patient Care Team: Elby Showers, MD as PCP - General (Internal Medicine) Mauro Kaufmann, RN as Oncology Nurse Navigator Rockwell Germany, RN as Oncology Nurse Navigator Nicholas Lose, MD as Consulting Physician (Hematology and Oncology) Nicholas Lose, MD as Consulting Physician (Hematology and Oncology)  Visit Date: 03/01/22  Subjective:    Patient ID: [name] , Female   DOB: 1960-12-02, 62 y.o.    MRN: [id number]   62 y.o. Female presents today for 6 month follow-up. Patient has a past medical history of arthritis, asthma, breast cancer, Crohn disease, GERD, depression, thyroid nodules, pneumonia, sleep apnea, thyroid cancer.  Status post left breast reexcision lumpectomy and left axillary sentinel lymph node mapping with mag trace 01/22/22. She has just started chemotherapy. Her schedule is for 4 cycles of chemotherapy with Taxotere and Cytoxan. She is going to then have radiation therapy.     History of hypertension treated with Buspar 7.5 mg twice daily.  History of hypothyroidism treated with Synthroid 100 mcg daily before breakfast.   Past Medical History:  Diagnosis Date   Anxiety    Allergy induced asthma   Arthritis    Asthma    Breast cancer ([place])    Crohn disease ([place])    Depression    GERD (gastroesophageal reflux disease)    History of methicillin resistant staphylococcus aureus (MRSA)    pt denies this.   Multiple thyroid nodules    Pneumonia    Sleep apnea    CPAP   Thyroid cancer ([place])      Family History  Problem Relation Age of Onset   Alcohol abuse Mother    Alcohol abuse Sister    Angioedema Neg Hx    Allergic rhinitis Neg Hx    Asthma Neg Hx    Atopy Neg Hx    Eczema Neg Hx    Immunodeficiency Neg Hx    Urticaria Neg Hx    Colon cancer Neg Hx    Stomach cancer Neg Hx    Esophageal cancer Neg Hx    Colon polyps Neg Hx     Social History   Social History Narrative   Not on file      ROS      Objective:    Vitals: LMP 01/10/2014    Physical Exam    Results:   Studies obtained and personally reviewed by me:  Imaging, colonoscopy, mammogram, bone density scan, echocardiogram, heart cath, stress test, CT calcium score, etc. ***   Labs:       Component Value Date/Time   NA 136 02/18/2022 0942   K 3.7 02/18/2022 0942   CL 104 02/18/2022 0942   CO2 28 02/18/2022 0942   GLUCOSE 79 02/18/2022 0942   BUN 12 02/18/2022 0942   CREATININE 0.68 02/18/2022 0942   CREATININE 0.74 08/30/2021 0939   CALCIUM 9.1 02/18/2022 0942   PROT 6.3 (L) 02/18/2022 0942   ALBUMIN 3.6 02/18/2022 0942   AST 17 02/18/2022 0942   ALT 16 02/18/2022 0942   ALKPHOS 52 02/18/2022 0942   BILITOT 0.4 02/18/2022 0942   GFRNONAA >60 02/18/2022 0942   GFRNONAA 96 03/02/2020 1100   GFRAA 112 03/02/2020 1100     Lab Results  Component Value Date   WBC 9.3 02/18/2022   HGB 12.3 02/18/2022   HCT 36.9 02/18/2022   MCV 92.9 02/18/2022   PLT 190 02/18/2022    Lab Results  Component Value Date   CHOL 275 (H) 08/30/2021  HDL 96 08/30/2021   LDLCALC 160 (H) 08/30/2021   TRIG 84 08/30/2021   CHOLHDL 2.9 08/30/2021    No results found for: "HGBA1C"   Lab Results  Component Value Date   TSH 1.77 02/01/2022     No results found for: "PSA1", "PSA" *** delete for female pts  ***    Assessment & Plan:   ***    I,Alexander Ruley,acting as a scribe for Elby Showers, MD.,have documented all relevant documentation on the behalf of Elby Showers, MD,as directed by  Elby Showers, MD while in the presence of Elby Showers, MD.   ***

## 2022-03-04 ENCOUNTER — Inpatient Hospital Stay: Payer: 59

## 2022-03-04 ENCOUNTER — Inpatient Hospital Stay (HOSPITAL_BASED_OUTPATIENT_CLINIC_OR_DEPARTMENT_OTHER): Payer: 59 | Admitting: Adult Health

## 2022-03-04 ENCOUNTER — Other Ambulatory Visit: Payer: Self-pay

## 2022-03-04 ENCOUNTER — Other Ambulatory Visit: Payer: 59

## 2022-03-04 ENCOUNTER — Encounter: Payer: Self-pay | Admitting: Adult Health

## 2022-03-04 VITALS — BP 124/72 | HR 77 | Resp 16

## 2022-03-04 DIAGNOSIS — C50212 Malignant neoplasm of upper-inner quadrant of left female breast: Secondary | ICD-10-CM | POA: Diagnosis not present

## 2022-03-04 DIAGNOSIS — R197 Diarrhea, unspecified: Secondary | ICD-10-CM | POA: Diagnosis not present

## 2022-03-04 DIAGNOSIS — E7800 Pure hypercholesterolemia, unspecified: Secondary | ICD-10-CM

## 2022-03-04 DIAGNOSIS — C73 Malignant neoplasm of thyroid gland: Secondary | ICD-10-CM | POA: Diagnosis not present

## 2022-03-04 DIAGNOSIS — Z5189 Encounter for other specified aftercare: Secondary | ICD-10-CM | POA: Diagnosis not present

## 2022-03-04 DIAGNOSIS — Z5111 Encounter for antineoplastic chemotherapy: Secondary | ICD-10-CM | POA: Diagnosis not present

## 2022-03-04 DIAGNOSIS — K5090 Crohn's disease, unspecified, without complications: Secondary | ICD-10-CM | POA: Diagnosis not present

## 2022-03-04 DIAGNOSIS — R110 Nausea: Secondary | ICD-10-CM | POA: Diagnosis not present

## 2022-03-04 DIAGNOSIS — Z95828 Presence of other vascular implants and grafts: Secondary | ICD-10-CM

## 2022-03-04 DIAGNOSIS — Z170 Estrogen receptor positive status [ER+]: Secondary | ICD-10-CM | POA: Diagnosis not present

## 2022-03-04 LAB — CMP (CANCER CENTER ONLY)
ALT: 37 U/L (ref 0–44)
AST: 29 U/L (ref 15–41)
Albumin: 4.2 g/dL (ref 3.5–5.0)
Alkaline Phosphatase: 74 U/L (ref 38–126)
Anion gap: 7 (ref 5–15)
BUN: 13 mg/dL (ref 8–23)
CO2: 27 mmol/L (ref 22–32)
Calcium: 9.3 mg/dL (ref 8.9–10.3)
Chloride: 104 mmol/L (ref 98–111)
Creatinine: 0.54 mg/dL (ref 0.44–1.00)
GFR, Estimated: >60 mL/min (ref >60)
Glucose, Bld: 104 mg/dL — ABNORMAL HIGH (ref 70–99)
Potassium: 3.9 mmol/L (ref 3.5–5.1)
Sodium: 138 mmol/L (ref 135–145)
Total Bilirubin: 0.3 mg/dL (ref 0.3–1.2)
Total Protein: 7.3 g/dL (ref 6.5–8.1)

## 2022-03-04 LAB — CBC WITH DIFFERENTIAL (CANCER CENTER ONLY)
Abs Immature Granulocytes: 0.05 10*3/uL (ref 0.00–0.07)
Basophils Absolute: 0 10*3/uL (ref 0.0–0.1)
Basophils Relative: 0 %
Eosinophils Absolute: 0.1 10*3/uL (ref 0.0–0.5)
Eosinophils Relative: 1 %
HCT: 33.6 % — ABNORMAL LOW (ref 36.0–46.0)
Hemoglobin: 11.4 g/dL — ABNORMAL LOW (ref 12.0–15.0)
Immature Granulocytes: 0 %
Lymphocytes Relative: 12 %
Lymphs Abs: 1.5 10*3/uL (ref 0.7–4.0)
MCH: 31.2 pg (ref 26.0–34.0)
MCHC: 33.9 g/dL (ref 30.0–36.0)
MCV: 92.1 fL (ref 80.0–100.0)
Monocytes Absolute: 0.9 10*3/uL (ref 0.1–1.0)
Monocytes Relative: 8 %
Neutro Abs: 9.2 10*3/uL — ABNORMAL HIGH (ref 1.7–7.7)
Neutrophils Relative %: 79 %
Platelet Count: 457 10*3/uL — ABNORMAL HIGH (ref 150–400)
RBC: 3.65 MIL/uL — ABNORMAL LOW (ref 3.87–5.11)
RDW: 13.9 % (ref 11.5–15.5)
WBC Count: 11.8 10*3/uL — ABNORMAL HIGH (ref 4.0–10.5)
nRBC: 0 % (ref 0.0–0.2)

## 2022-03-04 LAB — TSH: TSH: 1.492 u[IU]/mL (ref 0.350–4.500)

## 2022-03-04 MED ORDER — PALONOSETRON HCL INJECTION 0.25 MG/5ML
0.2500 mg | Freq: Once | INTRAVENOUS | Status: AC
  Administered 2022-03-04: 0.25 mg via INTRAVENOUS
  Filled 2022-03-04: qty 5

## 2022-03-04 MED ORDER — SODIUM CHLORIDE 0.9% FLUSH
10.0000 mL | INTRAVENOUS | Status: DC | PRN
  Administered 2022-03-04: 10 mL

## 2022-03-04 MED ORDER — SODIUM CHLORIDE 0.9 % IV SOLN
150.0000 mg | Freq: Once | INTRAVENOUS | Status: AC
  Administered 2022-03-04: 150 mg via INTRAVENOUS
  Filled 2022-03-04: qty 150

## 2022-03-04 MED ORDER — SODIUM CHLORIDE 0.9 % IV SOLN
75.0000 mg/m2 | Freq: Once | INTRAVENOUS | Status: AC
  Administered 2022-03-04: 129 mg via INTRAVENOUS
  Filled 2022-03-04: qty 12.9

## 2022-03-04 MED ORDER — HEPARIN SOD (PORK) LOCK FLUSH 100 UNIT/ML IV SOLN
500.0000 [IU] | Freq: Once | INTRAVENOUS | Status: AC | PRN
  Administered 2022-03-04: 500 [IU]

## 2022-03-04 MED ORDER — SODIUM CHLORIDE 0.9 % IV SOLN
600.0000 mg/m2 | Freq: Once | INTRAVENOUS | Status: AC
  Administered 2022-03-04: 1000 mg via INTRAVENOUS
  Filled 2022-03-04: qty 50

## 2022-03-04 MED ORDER — SODIUM CHLORIDE 0.9% FLUSH
10.0000 mL | INTRAVENOUS | Status: AC | PRN
  Administered 2022-03-04: 10 mL

## 2022-03-04 MED ORDER — SODIUM CHLORIDE 0.9 % IV SOLN: Freq: Once | INTRAVENOUS | Status: AC

## 2022-03-04 MED ORDER — SODIUM CHLORIDE 0.9 % IV SOLN
10.0000 mg | Freq: Once | INTRAVENOUS | Status: AC
  Administered 2022-03-04: 10 mg via INTRAVENOUS
  Filled 2022-03-04: qty 10

## 2022-03-04 NOTE — Patient Instructions (Signed)
[facility]   Discharge Instructions: Thank you for choosing [facility] to provide your oncology and hematology care.   If you have a lab appointment with the [facility], please go directly to the [facility] and check in at the registration area.   Wear comfortable clothing and clothing appropriate for easy access to any Portacath or PICC line.   We strive to give you quality time with your provider. You may need to reschedule your appointment if you arrive late (15 or more minutes).  Arriving late affects you and other patients whose appointments are after yours.  Also, if you miss three or more appointments without notifying the office, you may be dismissed from the clinic at the provider's discretion.      For prescription refill requests, have your pharmacy contact our office and allow 72 hours for refills to be completed.    Today you received the following chemotherapy and/or immunotherapy agents: Docetaxel (Taxotere) and Cyclophosphamide (Cytoxan)      To help prevent nausea and vomiting after your treatment, we encourage you to take your nausea medication as directed.  BELOW ARE SYMPTOMS THAT SHOULD BE REPORTED IMMEDIATELY: *FEVER GREATER THAN 100.4 F (38 C) OR HIGHER *CHILLS OR SWEATING *NAUSEA AND VOMITING THAT IS NOT CONTROLLED WITH YOUR NAUSEA MEDICATION *UNUSUAL SHORTNESS OF BREATH *UNUSUAL BRUISING OR BLEEDING *URINARY PROBLEMS (pain or burning when urinating, or frequent urination) *BOWEL PROBLEMS (unusual diarrhea, constipation, pain near the anus) TENDERNESS IN MOUTH AND THROAT WITH OR WITHOUT PRESENCE OF ULCERS (sore throat, sores in mouth, or a toothache) UNUSUAL RASH, SWELLING OR PAIN  UNUSUAL VAGINAL DISCHARGE OR ITCHING   Items with * indicate a potential emergency and should be followed up as soon as possible or go to the Emergency Department if any problems should occur.  Please show the CHEMOTHERAPY  ALERT CARD or IMMUNOTHERAPY ALERT CARD at check-in to the Emergency Department and triage nurse.  Should you have questions after your visit or need to cancel or reschedule your appointment, please contact [facility]  Dept: [phone]  and follow the prompts.  Office hours are 8:00 a.m. to 4:30 p.m. Monday - Friday. Please note that voicemails left after 4:00 p.m. may not be returned until the following business day.  We are closed weekends and major holidays. You have access to a nurse at all times for urgent questions. Please call the main number to the clinic Dept: [phone] and follow the prompts.   For any non-urgent questions, you may also contact your provider using MyChart. We now offer e-Visits for anyone 58 and older to request care online for non-urgent symptoms. For details visit [email or web address].   Also download the MyChart app! Go to the app store, search "MyChart", open the app, select [facility], and log in with your MyChart username and password.

## 2022-03-04 NOTE — Assessment & Plan Note (Signed)
[name] is a 62 year old woman with history of stage Ia ER/PR positive breast cancer diagnosed in December 2023 status postlumpectomy followed by reexcision and now continues on adjuvant chemotherapy with Taxotere and Cytoxan given on day 1 of a 21-day cycle x 4 cycles.  [name] is tolerating chemotherapy well.  She will proceed with her second cycle of Taxotere and Cytoxan today.  She will continue to use cryotherapy/constriction gloves to help prevent peripheral neuropathy.  She did experience some numbness and tingling afterwards but that is resolved today.  Her labs today are stable and I reviewed them with her in detail.  I examined her left axilla and I do not see any signs of infection.  I did offer her Augmentin in case the area worsens however she does not want to have that at this point and will call us if she feels the area is getting reddened, warm, tender, or worse in general.  She does see physical therapy for this tomorrow.  Otherwise she is doing well and we will continue to monitor her closely for neuropathy.  She will return in 2 days for her growth factor injection and we will see her back in 3 weeks for labs, follow-up, and cycle 3 of her Taxotere and Cytoxan.

## 2022-03-04 NOTE — Progress Notes (Signed)
[facility] Follow up:    Stacey Showers, MD [address] [place] [id number]   DIAGNOSIS:  Cancer Staging  Malignant neoplasm of upper-inner quadrant of left breast in female, estrogen receptor positive ([place]) Staging form: Breast, AJCC 8th Edition - Clinical: Stage IA (cT1c, cN0, cM0, G2, ER+, PR+, HER2-) - Signed by Nicholas Lose, MD on 01/16/2022 Histologic grading system: 3 grade system   SUMMARY OF ONCOLOGIC HISTORY: Oncology History  Malignant neoplasm of upper-inner quadrant of left breast in female, estrogen receptor positive ([place])  01/01/2022 Surgery   Initial biopsy left breast: ALH Left lumpectomy: 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, lateral margin positive ER 90%, PR 100%, Ki-67 5%, HER2 equivocal by IHC 2+, FISH negative ratio 0.93 (Total thyroidectomy: Papillary carcinoma right lobe, 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 female returns for evaluation prior to receiving cycle 2 of Taxotere and Cytoxan.  She is doing moderately well today.  She denies any current peripheral neuropathy.  She tells me that her diarrhea has resolved and that she is struggling with some left axillary lymphedema and swelling.  There is no erythema warmth or tenderness to the area and she denies any fevers or chills.       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   Nodule of lower lobe of right lung 10/17/2021   Left upper lobe pulmonary nodule 10/17/2021   Acromioclavicular sprain, left, initial encounter 07/27/2021   Tibialis posterior tendinitis, right 06/15/2020   Piriformis syndrome of left side [id number]   Umbilical hernia [id number]   Lumbar radiculopathy 07/09/2019   Contusion of right hip and thigh 04/20/2019   Loss of transverse plantar arch 02/11/2019   Sprain of iliolumbar ligament 10/29/2018   Piriformis syndrome of right side 09/30/2018   Nonallopathic lesion of sacral region 09/30/2018   Nonallopathic lesion of lumbosacral region 09/30/2018   Nonallopathic lesion of thoracic region 09/30/2018   Peroneal tendinitis of left lower extremity 02/11/2018   Polyarthralgia 02/11/2018   Patellofemoral syndrome of right knee 02/11/2018   Gastroesophageal reflux disease 01/16/2017   Cough variant asthma 01/16/2017   Noncompliance with medication treatment due to intermittent use of medication 01/16/2017   Allergic rhinoconjunctivitis 11/08/2015   Sinobronchitis 10/03/2015   OSA (obstructive sleep apnea) 12/14/2014   Crohn's disease of colon ([place]) 05/24/2014   Depression 02/01/2014   Disorder of intestine 12/17/2010   Anorectal polyp 12/17/2010   Glaucoma 01/14/2010   Dry skin dermatitis 01/15/2008   Crohn's disease ([place]) 01/14/2005    is allergic to chlorhexidine gluconate, gabapentin, hydrocodone, oxycodone hcl, and betadine [povidone iodine].  MEDICAL HISTORY: Past Medical History:  Diagnosis Date   Anxiety    Allergy induced asthma   Arthritis    Asthma    Breast cancer ([place])    Crohn disease ([place])    Depression    GERD (gastroesophageal reflux disease)    History of methicillin  resistant staphylococcus aureus (MRSA)    pt denies this.   Multiple thyroid nodules    Pneumonia    Sleep apnea    CPAP   Thyroid cancer ([place])     SURGICAL HISTORY: Past Surgical History:  Procedure Laterality Date   ANAL FISSURE REPAIR  2004   BREAST LUMPECTOMY WITH RADIOACTIVE SEED LOCALIZATION Left 01/01/2022   Procedure: LEFT BREAST SEED LUMPECTOMY;  Surgeon: Erroll Luna, MD;  Location: [facility];  Service: General;  Laterality: Left;   CESAREAN SECTION     COLONOSCOPY     INSERTION OF MESH N/A 07/26/2020   Procedure: INSERTION OF MESH;  Surgeon: Ralene Ok, MD;  Location: [facility];  Service: General;  Laterality: N/A;   LIGAMENT REPAIR Right 11/23/2015   Procedure: right index radial collateral LIGAMENT REPAIR;  Surgeon: Leanora Cover, MD;  Location: [facility] [place];  Service: Orthopedics;  Laterality: Right;   PORTACATH PLACEMENT Right 02/07/2022   Procedure: INSERTION PORT-A-CATH;  Surgeon: Erroll Luna, MD;  Location: [facility];  Service: General;  Laterality: Right;   RE-EXCISION OF BREAST LUMPECTOMY Left 01/22/2022   Procedure: RE-EXCISION OF LEFT BREAST LUMPECTOMY;  Surgeon: Erroll Luna, MD;  [facility];  Service: General;  Laterality: Left;   SENTINEL NODE BIOPSY Left 01/22/2022   Procedure: LEFT SENTINEL NODE BIOPSY;  Surgeon: Erroll Luna, MD;  Location: [facility];  Service: General;  Laterality: Left;   THYROIDECTOMY N/A 01/01/2022   Procedure: TOTAL THYROIDECTOMY WITH LIMITED LYMPH NODE DISSECTION;  Surgeon: Armandina Gemma, MD;  Location: [facility];  Service: General;  Laterality: N/A;   UPPER ENDOSCOPY W/ ESOPHAGEAL MANOMETRY  10/2021   VENTRAL HERNIA REPAIR N/A 07/26/2020   Procedure: LAPAROSCOPIC VENTRAL HERNIA REPAIR WITH MESH;  Surgeon: Ralene Ok, MD;  Location: [facility];  Service: General;  Laterality: N/A;    SOCIAL HISTORY: Social History   Socioeconomic History   Marital status: Married    Spouse name:  Not on file   Number of children: 2   Years of education: Not on file   Highest education level: Not on file  Occupational History   Occupation: Self employed  Tobacco Use   Smoking status: Never    Passive exposure: Never   Smokeless tobacco: Never  Vaping Use   Vaping Use: Never used  Substance and Sexual Activity   Alcohol use: Yes    Alcohol/week: 10.0 standard drinks of alcohol    Types: 10 Glasses of wine per week    Comment: 10 glasses/week   Drug use: No   Sexual activity: Yes    Birth control/protection: Post-menopausal  Other Topics Concern   Not on file  Social History Narrative   Not on file   Social Determinants of Health   Financial Resource Strain: Not on file  Food Insecurity: Not on file  Transportation Needs: Not on file  Physical Activity: Not on file  Stress: Not on file  Social Connections: Not on file  Intimate Partner Violence: Not on file    FAMILY HISTORY: Family History  Problem Relation Age of Onset   Alcohol abuse Mother    Alcohol abuse Sister    Angioedema Neg Hx    Allergic  rhinitis Neg Hx    Asthma Neg Hx    Atopy Neg Hx    Eczema Neg Hx    Immunodeficiency Neg Hx    Urticaria Neg Hx    Colon cancer Neg Hx    Stomach cancer Neg Hx    Esophageal cancer Neg Hx    Colon polyps Neg Hx     Review of Systems  Constitutional:  Negative for appetite change, chills, fatigue, fever and unexpected weight change.  HENT:   Negative for hearing loss, lump/mass and trouble swallowing.   Eyes:  Negative for eye problems and icterus.  Respiratory:  Negative for chest tightness, cough and shortness of breath.   Cardiovascular:  Negative for chest pain, leg swelling and palpitations.  Gastrointestinal:  Negative for abdominal distention, abdominal pain, constipation, diarrhea, nausea and vomiting.  Endocrine: Negative for hot flashes.  Genitourinary:  Negative for difficulty urinating.   Musculoskeletal:  Negative for arthralgias.  Skin:   Negative for itching and rash.  Neurological:  Negative for dizziness, extremity weakness, headaches and numbness.  Hematological:  Negative for adenopathy. Does not bruise/bleed easily.  Psychiatric/Behavioral:  Negative for depression. The patient is not nervous/anxious.       PHYSICAL EXAMINATION  ECOG PERFORMANCE STATUS: 1 - Symptomatic but completely ambulatory  Vitals:   03/04/22 0951  BP: 133/78  Pulse: 76  Resp: 18  Temp: 98.2 F (36.8 C)  SpO2: 100%    Physical Exam Constitutional:      General: She is not in acute distress.    Appearance: Normal appearance. She is not toxic-appearing.  HENT:     Head: Normocephalic and atraumatic.  Eyes:     General: No scleral icterus. Cardiovascular:     Rate and Rhythm: Normal rate and regular rhythm.     Pulses: Normal pulses.     Heart sounds: Normal heart sounds.  Pulmonary:     Effort: Pulmonary effort is normal.     Breath sounds: Normal breath sounds.  Chest:     Comments: I examined her left axillary site and though it is mildly swelling there is no erythema warmth tenderness or drainage noted. Abdominal:     General: Abdomen is flat. Bowel sounds are normal. There is no distension.     Palpations: Abdomen is soft.     Tenderness: There is no abdominal tenderness.  Musculoskeletal:        General: No swelling.     Cervical back: Neck supple.  Lymphadenopathy:     Cervical: No cervical adenopathy.  Skin:    General: Skin is warm and dry.     Findings: No rash.  Neurological:     General: No focal deficit present.     Mental Status: She is alert.  Psychiatric:        Mood and Affect: Mood normal.        Behavior: Behavior normal.     LABORATORY DATA:  CBC    Component Value Date/Time   WBC 11.8 (H) 03/04/2022 0925   WBC 5.8 12/25/2021 1501   RBC 3.65 (L) 03/04/2022 0925   HGB 11.4 (L) 03/04/2022 0925   HCT 33.6 (L) 03/04/2022 0925   PLT 457 (H) 03/04/2022 0925   MCV 92.1 03/04/2022 0925   MCH  31.2 03/04/2022 0925   MCHC 33.9 03/04/2022 0925   RDW 13.9 03/04/2022 0925   LYMPHSABS 1.5 03/04/2022 0925   MONOABS 0.9 03/04/2022 0925   EOSABS 0.1 03/04/2022 0925   BASOSABS 0.0 03/04/2022  0925    CMP     Component Value Date/Time   NA 138 03/04/2022 0925   K 3.9 03/04/2022 0925   CL 104 03/04/2022 0925   CO2 27 03/04/2022 0925   GLUCOSE 104 (H) 03/04/2022 0925   BUN 13 03/04/2022 0925   CREATININE 0.54 03/04/2022 0925   CREATININE 0.74 08/30/2021 0939   CALCIUM 9.3 03/04/2022 0925   PROT 7.3 03/04/2022 0925   ALBUMIN 4.2 03/04/2022 0925   AST 29 03/04/2022 0925   ALT 37 03/04/2022 0925   ALKPHOS 74 03/04/2022 0925   BILITOT 0.3 03/04/2022 0925   GFRNONAA >60 03/04/2022 0925   GFRNONAA 96 03/02/2020 1100   GFRAA 112 03/02/2020 1100         ASSESSMENT and THERAPY PLAN:   Malignant neoplasm of upper-inner quadrant of left breast in female, estrogen receptor positive (HCC) [name] is a 62 year old woman with history of stage Ia ER/PR positive breast cancer diagnosed in December 2023 status postlumpectomy followed by reexcision and now continues on adjuvant chemotherapy with Taxotere and Cytoxan given on day 1 of a 21-day cycle x 4 cycles.  [name] is tolerating chemotherapy well.  She will proceed with her second cycle of Taxotere and Cytoxan today.  She will continue to use cryotherapy/constriction gloves to help prevent peripheral neuropathy.  She did experience some numbness and tingling afterwards but that is resolved today.  Her labs today are stable and I reviewed them with her in detail.  I examined her left axilla and I do not see any signs of infection.  I did offer her Augmentin in case the area worsens however she does not want to have that at this point and will call us if she feels the area is getting reddened, warm, tender, or worse in general.  She does see physical therapy for this tomorrow.  Otherwise she is doing well and we will continue to monitor her  closely for neuropathy.  She will return in 2 days for her growth factor injection and we will see her back in 3 weeks for labs, follow-up, and cycle 3 of her Taxotere and Cytoxan.   All questions were answered. The patient knows to call the clinic with any problems, questions or concerns. We can certainly see the patient much sooner if necessary.  Total encounter time:20 minutes*in face-to-face visit time, chart review, lab review, care coordination, order entry, and documentation of the encounter time.    Wilber Bihari, NP 03/04/22 10:47 AM Medical Oncology and Hematology [facility] [place],  [address] Tel. [phone]    Fax. [phone]  *Total Encounter Time as defined by the Centers for Medicare and Medicaid Services includes, in addition to the face-to-face time of a patient visit (documented in the note above) non-face-to-face time: obtaining and reviewing outside history, ordering and reviewing medications, tests or procedures, care coordination (communications with other health care professionals or caregivers) and documentation in the medical record.

## 2022-03-05 ENCOUNTER — Telehealth: Payer: Self-pay | Admitting: Internal Medicine

## 2022-03-05 ENCOUNTER — Ambulatory Visit: Payer: 59

## 2022-03-05 ENCOUNTER — Encounter: Payer: Self-pay | Admitting: Internal Medicine

## 2022-03-05 DIAGNOSIS — R293 Abnormal posture: Secondary | ICD-10-CM

## 2022-03-05 DIAGNOSIS — Z483 Aftercare following surgery for neoplasm: Secondary | ICD-10-CM

## 2022-03-05 DIAGNOSIS — C50212 Malignant neoplasm of upper-inner quadrant of left female breast: Secondary | ICD-10-CM | POA: Diagnosis not present

## 2022-03-05 DIAGNOSIS — R600 Localized edema: Secondary | ICD-10-CM

## 2022-03-05 NOTE — Therapy (Addendum)
OUTPATIENT PT UPPER EXTREMITY LYMPHEDEMA TREATMENT  Patient Name: [name] MRN: [id number] DOB:02/18/1960, 62 y.o., female Today's Date: 03/05/2022  END OF SESSION:  PT End of Session - 03/05/22 1111     Visit Number 5    Number of Visits 15    Date for PT Re-Evaluation 03/25/22    PT Start Time 1105    PT Stop Time 1203    PT Time Calculation (min) 58 min    Activity Tolerance Patient tolerated treatment well    Behavior During Therapy WFL for tasks assessed/performed             Past Medical History:  Diagnosis Date   Anxiety    Allergy induced asthma   Arthritis    Asthma    Breast cancer ([place])    Crohn disease ([place])    Depression    GERD (gastroesophageal reflux disease)    History of methicillin resistant staphylococcus aureus (MRSA)    pt denies this.   Multiple thyroid nodules    Pneumonia    Sleep apnea    CPAP   Thyroid cancer [facility])    Past Surgical History:  Procedure Laterality Date   ANAL FISSURE REPAIR  2004   BREAST LUMPECTOMY WITH RADIOACTIVE SEED LOCALIZATION Left 01/01/2022   Procedure: LEFT BREAST SEED LUMPECTOMY;  Surgeon: Erroll Luna, MD;  Location: [place];  Service: General;  Laterality: Left;   CESAREAN SECTION     COLONOSCOPY     INSERTION OF MESH N/A 07/26/2020   Procedure: INSERTION OF MESH;  Surgeon: Ralene Ok, MD;  Location: [place];  Service: General;  Laterality: N/A;   LIGAMENT REPAIR Right 11/23/2015   Procedure: right index radial collateral LIGAMENT REPAIR;  Surgeon: Leanora Cover, MD;  Location: [place];  Service: Orthopedics;  Laterality: Right;   PORTACATH PLACEMENT Right 02/07/2022   Procedure: INSERTION PORT-A-CATH;  Surgeon: Erroll Luna, MD;  Location: [place];  Service: General;  Laterality: Right;   RE-EXCISION OF BREAST LUMPECTOMY Left 01/22/2022   Procedure: RE-EXCISION OF LEFT BREAST LUMPECTOMY;  Surgeon: Erroll Luna, MD;  Location: [place];  Service: General;   Laterality: Left;   SENTINEL NODE BIOPSY Left 01/22/2022   Procedure: LEFT SENTINEL NODE BIOPSY;  Surgeon: Erroll Luna, MD;  Location: [place];  Service: General;  Laterality: Left;   THYROIDECTOMY N/A 01/01/2022   Procedure: TOTAL THYROIDECTOMY WITH LIMITED LYMPH NODE DISSECTION;  Surgeon: Armandina Gemma, MD;  Location: [place];  Service: General;  Laterality: N/A;   UPPER ENDOSCOPY W/ ESOPHAGEAL MANOMETRY  10/2021   VENTRAL HERNIA REPAIR N/A 07/26/2020   Procedure: LAPAROSCOPIC VENTRAL HERNIA REPAIR WITH MESH;  Surgeon: Ralene Ok, MD;  Location: [place];  Service: General;  Laterality: N/A;   Patient Active Problem List   Diagnosis Date Noted   Malignant neoplasm of upper-inner quadrant of left breast in female, estrogen receptor positive ([place]) 01/09/2022   Postsurgical hypothyroidism 01/04/2022   Multiple thyroid nodules 12/20/2021   Papillary thyroid carcinoma ([place]) 12/20/2021   Nodule of lower lobe of right lung 10/17/2021   Left upper lobe pulmonary nodule 10/17/2021   Acromioclavicular sprain, left, initial encounter 07/27/2021   Tibialis posterior tendinitis, right 06/15/2020   Piriformis syndrome of left side [id number]   Umbilical hernia [id number]   Lumbar radiculopathy 07/09/2019   Contusion of right hip and thigh 04/20/2019   Loss of transverse plantar arch 02/11/2019   Sprain of iliolumbar ligament 10/29/2018   Piriformis syndrome of  right side 09/30/2018   Nonallopathic lesion of sacral region 09/30/2018   Nonallopathic lesion of lumbosacral region 09/30/2018   Nonallopathic lesion of thoracic region 09/30/2018   Peroneal tendinitis of left lower extremity 02/11/2018   Polyarthralgia 02/11/2018   Patellofemoral syndrome of right knee 02/11/2018   Gastroesophageal reflux disease 01/16/2017   Cough variant asthma 01/16/2017   Noncompliance with medication treatment due to intermittent use of medication 01/16/2017   Allergic rhinoconjunctivitis  11/08/2015   Sinobronchitis 10/03/2015   OSA (obstructive sleep apnea) 12/14/2014   Crohn's disease of colon ([place]) 05/24/2014   Depression 02/01/2014   Disorder of intestine 12/17/2010   Anorectal polyp 12/17/2010   Glaucoma 01/14/2010   Dry skin dermatitis 01/15/2008   Crohn's disease ([place]) 01/14/2005    REFERRING PROVIDER: Dr. Nicholas Lose  REFERRING DIAG: Axillary swelling  THERAPY DIAG:  Malignant neoplasm of upper-inner quadrant of left breast in female, estrogen receptor positive ([place])  Aftercare following surgery for neoplasm  Abnormal posture  Localized edema  ONSET DATE: 02/18/2022 for edema increase  Rationale for Evaluation and Treatment: Rehabilitation  SUBJECTIVE                                                                                                                                                                                           SUBJECTIVE STATEMENT: I ended up playing tennis after I left here last time and my cording felt a bit worse after so I've really been stretching. I tried to get someone to cover for me to play but couldn't and so I played. I had fun, but regretted it later. I won't be doing that again for awhile.   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 It measures 1.5 cm and is located in the upper inner quadrant with IDC only being present in lateral margin. It is ER/PR positive and HER2 negative with a Ki-67 of 5%. Thyroidectomy was also performed at the same time and 2 lymph nodes removed (nodes were negative) and all gross disease removed. Thyroid cancer will be treated with radioactive iodine.    PAIN:  Are you having pain? No, not currently  PRECAUTIONS: Other: Currently undergoing chemotherapy  WEIGHT BEARING RESTRICTIONS: No  FALLS:  Has patient fallen in last 6 months? No  LIVING ENVIRONMENT: Lives with: lives with their family Lives  in: House/apartment Has following equipment at home: None  OCCUPATION: Retired  LEISURE: She is walking an hour 5x/week  HAND DOMINANCE : right   PRIOR LEVEL OF FUNCTION: Independent  PATIENT GOALS: Reduce axillary edema for improved comfort  OBJECTIVE  COGNITION:  Overall cognitive status: Within functional limits for tasks assessed   PALPATION: Visible and palpable pocket of fluid present just superior to her axillary incision. It appears to be an organized seroma with thick fluid present. She also has 2 visible and palpable axillary cords present (see photo) First photo 02/25/22       Second photo on 02/27/22    OBSERVATIONS / OTHER ASSESSMENTS: Positive upper limb tension testing likely due to axillary cording.  SENSATION: Light touch: Appears intact Stereognosis: Appears intact Hot/Cold: Appears intact Proprioception: Appears intact  POSTURE: Forward head, rounded shoulders   HAND DOMINANCE: Right  UPPER EXTREMITY AROM/PROM: All UE bilateral AROM is WNL   UPPER EXTREMITY STRENGTH: Functional  TODAY'S TREATMENT:                                                                                                                                         DATE:  03/05/22: Therapeutic Exercises Pulleys into flexion and abd returning therapist demo x2 mins each and VC's during to decrease Lt scapular compensation Roll yellow ball up wall into flexion x10 and abd x5 returning therapist demo Modified downward dog on wall 5x, 5 sec holds returning therapist demo Manual Therapy MLD: Short neck, superficial and deep abdominals, Lt inguinal and Rt axillary and pectoral nodes, Lt axillo-inguinal and anterior inter-axillary anastomosis. This was done throughout [facility] MFR: To Lt axilla where cording is very mildly visible or palpable but pt does report still feeling the pull from  P/ROM: In Supine during MFR to Lt shoulder into flexion, abd, and D2 into pts available end motions and  with scapular depression by therapist throughout, her motion was much improved after scap mobs and STM to Lt scapula Scap Mobs in Rt S/L to Lt scapula into protraction and retraction, pt with very limited mobility that did improve some by end of session STM: In Rt S/L with cocoa butter to Lt periscapular region and UT for trigger point release  02/27/22: Manual Therapy MLD: Short neck, superficial and deep abdominals, Lt inguinal and Rt axillary and pectoral nodes, Lt axillo-inguinal and anterior inter-axillary anastomosis, then Lt breast and Lt upper arm (lateral, medial to lateral and lateral again) to promote lymphatic drainage, especially as seroma has reduced so much. This was done throughout [facility] MFR: To Lt axilla where tightness from cording palpable but barely visible today, also into upper arm where tightness palpated P/ROM: In Supine during MFR to Lt shoulder into flexion and abd to pts available end motions  02/25/2022 Performed abbreviated manual lymph drainage focused on left axillary region: Short neck, right axillary and left inguinal region stimulated lymph nodes; anterior inter-axillary and left axillo-inguinal pathway; left lateral breast and axillary region redirecting along pathways. Pt in supine and draped appropriately with towel. Myofascial release to left axillary region where axillary cords are present. Passive prolonged stretch to left shoulder flexion  and abduction in supine to pt tolerance to regain tissue release at end ROM and improve cording. Created and applied a chip pack to her left axillary region to break up thick edema. Also applied 12 cm Comprilan compression bandage around her chest (instructed her to doff bandage after 3 hours) to hold chip pack in place and to apply compression to the chip pack for improved effect.  PATIENT EDUCATION:  Education details: Instructed pt to wear her chip pack in her left axilla as tolerated to see if it reduced edema Person  educated: Patient Education method: Explanation Education comprehension: verbalized understanding  HOME EXERCISE PROGRAM: None issued today  ASSESSMENT:  CLINICAL IMPRESSION: Despite pt reporting her cording feeling worse after playing tennis after last session it did not appear to be worse today. Pt does report she has really been working on stretching and MFR since then and has felt it improve. Added AA/ROM exercises which pt reports feeling great stretches from. And during manual therapy added STM and scap mobs which helped to improve her end P/ROM of Lt shoulder and pt reports her Lt upper quadrant feeling much looser by end of session.   OBJECTIVE IMPAIRMENTS: increased edema and increased fascial restrictions.   ACTIVITY LIMITATIONS:  Not functionally limited; edema and cording increase her risk for lymphedema  and needs to be addressed a resolved.  PARTICIPATION LIMITATIONS:  none  PERSONAL FACTORS: 1 comorbidity: Currently undergoing chemotherapy  is also affecting patient's functional outcome.   REHAB POTENTIAL: Excellent  CLINICAL DECISION MAKING: Stable/uncomplicated  EVALUATION COMPLEXITY: Low  GOALS: Goals reviewed with patient? Yes  LONG TERM GOALS: Target date: 03/25/2022    Patient will demonstrate her swelling in her left axilla has resolved so that she can wear a normal bra.  Goal status: INITIAL  2.  Patient will demonstrate proper scar massage and manual lymph drainage techniques for improving edema.  Goal status: INITIAL  3.  Patient will demonstrate axillary cording has resolved so there is no tightness present at end ROM for left shoulder.   Goal status: INITIAL  PLAN:  PT FREQUENCY: 3x/week  PT DURATION: 4 weeks  PLANNED INTERVENTIONS: Therapeutic exercises, Therapeutic activity, Patient/Family education, Self Care, Manual lymph drainage, Compression bandaging, scar mobilization, Manual therapy, and Re-evaluation  PLAN FOR NEXT SESSION: Cont  AA/ROM stretches and cont to address cording with myofascial release; manual lymph drainage prn, cont scap mobs and STM to periscapular area.   Collie Siad, PTA 03/05/22 12:17 PM;

## 2022-03-05 NOTE — Telephone Encounter (Signed)
Berdie Ogren [phone]  Hilda Blades called to see if she really needed to come in on Thursday for her 6 month, since she is seeing so many doctors right now or could she just come in for her annual in August.

## 2022-03-06 ENCOUNTER — Inpatient Hospital Stay: Payer: 59

## 2022-03-06 ENCOUNTER — Other Ambulatory Visit: Payer: Self-pay

## 2022-03-06 VITALS — BP 123/69 | HR 87 | Temp 99.5°F | Resp 16

## 2022-03-06 DIAGNOSIS — Z5111 Encounter for antineoplastic chemotherapy: Secondary | ICD-10-CM | POA: Diagnosis not present

## 2022-03-06 DIAGNOSIS — C50212 Malignant neoplasm of upper-inner quadrant of left female breast: Secondary | ICD-10-CM | POA: Diagnosis not present

## 2022-03-06 DIAGNOSIS — R110 Nausea: Secondary | ICD-10-CM | POA: Diagnosis not present

## 2022-03-06 DIAGNOSIS — R197 Diarrhea, unspecified: Secondary | ICD-10-CM | POA: Diagnosis not present

## 2022-03-06 DIAGNOSIS — Z170 Estrogen receptor positive status [ER+]: Secondary | ICD-10-CM | POA: Diagnosis not present

## 2022-03-06 DIAGNOSIS — Z5189 Encounter for other specified aftercare: Secondary | ICD-10-CM | POA: Diagnosis not present

## 2022-03-06 DIAGNOSIS — C73 Malignant neoplasm of thyroid gland: Secondary | ICD-10-CM | POA: Diagnosis not present

## 2022-03-06 DIAGNOSIS — K5090 Crohn's disease, unspecified, without complications: Secondary | ICD-10-CM | POA: Diagnosis not present

## 2022-03-06 MED ORDER — PEGFILGRASTIM INJECTION 6 MG/0.6ML ~~LOC~~
6.0000 mg | PREFILLED_SYRINGE | Freq: Once | SUBCUTANEOUS | Status: AC
  Administered 2022-03-06: 6 mg via SUBCUTANEOUS
  Filled 2022-03-06: qty 0.6

## 2022-03-06 NOTE — Patient Instructions (Signed)

## 2022-03-07 ENCOUNTER — Encounter: Payer: Self-pay | Admitting: Internal Medicine

## 2022-03-07 ENCOUNTER — Ambulatory Visit: Payer: 59 | Admitting: Internal Medicine

## 2022-03-07 ENCOUNTER — Other Ambulatory Visit: Payer: Self-pay | Admitting: Internal Medicine

## 2022-03-07 ENCOUNTER — Ambulatory Visit: Payer: 59

## 2022-03-07 DIAGNOSIS — Z483 Aftercare following surgery for neoplasm: Secondary | ICD-10-CM

## 2022-03-07 DIAGNOSIS — R293 Abnormal posture: Secondary | ICD-10-CM

## 2022-03-07 DIAGNOSIS — C73 Malignant neoplasm of thyroid gland: Secondary | ICD-10-CM

## 2022-03-07 DIAGNOSIS — R600 Localized edema: Secondary | ICD-10-CM

## 2022-03-07 DIAGNOSIS — C50212 Malignant neoplasm of upper-inner quadrant of left female breast: Secondary | ICD-10-CM | POA: Diagnosis not present

## 2022-03-07 DIAGNOSIS — Z170 Estrogen receptor positive status [ER+]: Secondary | ICD-10-CM

## 2022-03-07 NOTE — Therapy (Addendum)
OUTPATIENT PHYSICAL THERAPY UPPER EXTREMITY LYMPHEDEMA TREATMENT  Patient Name: [name] MRN: [id number] DOB:1960-03-03, 62 y.o., female Today's Date: 03/07/2022  END OF SESSION:  PT End of Session - 03/07/22 1009     Visit Number 6    Number of Visits 15    Date for PT Re-Evaluation 03/25/22    PT Start Time 1006    PT Stop Time 1101    PT Time Calculation (min) 55 min    Activity Tolerance Patient tolerated treatment well    Behavior During Therapy WFL for tasks assessed/performed             Past Medical History:  Diagnosis Date   Anxiety    Allergy induced asthma   Arthritis    Asthma    Breast cancer ([place])    Crohn disease ([place])    Depression    GERD (gastroesophageal reflux disease)    History of methicillin resistant staphylococcus aureus (MRSA)    pt denies this.   Multiple thyroid nodules    Pneumonia    Sleep apnea    CPAP   Thyroid cancer [facility])    Past Surgical History:  Procedure Laterality Date   ANAL FISSURE REPAIR  2004   BREAST LUMPECTOMY WITH RADIOACTIVE SEED LOCALIZATION Left 01/01/2022   Procedure: LEFT BREAST SEED LUMPECTOMY;  Surgeon: Erroll Luna, MD;  Location: [place];  Service: General;  Laterality: Left;   CESAREAN SECTION     COLONOSCOPY     INSERTION OF MESH N/A 07/26/2020   Procedure: INSERTION OF MESH;  Surgeon: Ralene Ok, MD;  Location: [place];  Service: General;  Laterality: N/A;   LIGAMENT REPAIR Right 11/23/2015   Procedure: right index radial collateral LIGAMENT REPAIR;  Surgeon: Leanora Cover, MD;  Location: [place];  Service: Orthopedics;  Laterality: Right;   PORTACATH PLACEMENT Right 02/07/2022   Procedure: INSERTION PORT-A-CATH;  Surgeon: Erroll Luna, MD;  Location: [place];  Service: General;  Laterality: Right;   RE-EXCISION OF BREAST LUMPECTOMY Left 01/22/2022   Procedure: RE-EXCISION OF LEFT BREAST LUMPECTOMY;  Surgeon: Erroll Luna, MD;  Location: [place];  Service:  General;  Laterality: Left;   SENTINEL NODE BIOPSY Left 01/22/2022   Procedure: LEFT SENTINEL NODE BIOPSY;  Surgeon: Erroll Luna, MD;  Location: [place];  Service: General;  Laterality: Left;   THYROIDECTOMY N/A 01/01/2022   Procedure: TOTAL THYROIDECTOMY WITH LIMITED LYMPH NODE DISSECTION;  Surgeon: Armandina Gemma, MD;  Location: [place];  Service: General;  Laterality: N/A;   UPPER ENDOSCOPY W/ ESOPHAGEAL MANOMETRY  10/2021   VENTRAL HERNIA REPAIR N/A 07/26/2020   Procedure: LAPAROSCOPIC VENTRAL HERNIA REPAIR WITH MESH;  Surgeon: Ralene Ok, MD;  Location: [place];  Service: General;  Laterality: N/A;   Patient Active Problem List   Diagnosis Date Noted   Malignant neoplasm of upper-inner quadrant of left breast in female, estrogen receptor positive ([place]) 01/09/2022   Postsurgical hypothyroidism 01/04/2022   Multiple thyroid nodules 12/20/2021   Papillary thyroid carcinoma ([place]) 12/20/2021   Nodule of lower lobe of right lung 10/17/2021   Left upper lobe pulmonary nodule 10/17/2021   Acromioclavicular sprain, left, initial encounter 07/27/2021   Tibialis posterior tendinitis, right 06/15/2020   Piriformis syndrome of left side [id number]   Umbilical hernia [id number]   Lumbar radiculopathy 07/09/2019   Contusion of right hip and thigh 04/20/2019   Loss of transverse plantar arch 02/11/2019   Sprain of iliolumbar ligament 10/29/2018   Piriformis syndrome  of right side 09/30/2018   Nonallopathic lesion of sacral region 09/30/2018   Nonallopathic lesion of lumbosacral region 09/30/2018   Nonallopathic lesion of thoracic region 09/30/2018   Peroneal tendinitis of left lower extremity 02/11/2018   Polyarthralgia 02/11/2018   Patellofemoral syndrome of right knee 02/11/2018   Gastroesophageal reflux disease 01/16/2017   Cough variant asthma 01/16/2017   Noncompliance with medication treatment due to intermittent use of medication 01/16/2017   Allergic  rhinoconjunctivitis 11/08/2015   Sinobronchitis 10/03/2015   OSA (obstructive sleep apnea) 12/14/2014   Crohn's disease of colon ([place]) 05/24/2014   Depression 02/01/2014   Disorder of intestine 12/17/2010   Anorectal polyp 12/17/2010   Glaucoma 01/14/2010   Dry skin dermatitis 01/15/2008   Crohn's disease ([place]) 01/14/2005    REFERRING PROVIDER: Dr. Nicholas Lose  REFERRING DIAG: Axillary swelling  THERAPY DIAG:  Malignant neoplasm of upper-inner quadrant of left breast in female, estrogen receptor positive ([employer])  Aftercare following surgery for neoplasm  Abnormal posture  Localized edema  ONSET DATE: 02/18/2022 for edema increase  Rationale for Evaluation and Treatment: Rehabilitation  SUBJECTIVE                                                                                                                                                                                           SUBJECTIVE STATEMENT: I felt better after last session.   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 It measures 1.5 cm and is located in the upper inner quadrant with IDC only being present in lateral margin. It is ER/PR positive and HER2 negative with a Ki-67 of 5%. Thyroidectomy was also performed at the same time and 2 lymph nodes removed (nodes were negative) and all gross disease removed. Thyroid cancer will be treated with radioactive iodine.    PAIN:  Are you having pain? No, not currently  PRECAUTIONS: Other: Currently undergoing chemotherapy  WEIGHT BEARING RESTRICTIONS: No  FALLS:  Has patient fallen in last 6 months? No  LIVING ENVIRONMENT: Lives with: lives with their family Lives in: House/apartment Has following equipment at home: None  OCCUPATION: Retired  LEISURE: She is walking an hour 5x/week  HAND DOMINANCE : right   PRIOR LEVEL OF FUNCTION: Independent  PATIENT GOALS: Reduce  axillary edema for improved comfort   OBJECTIVE  COGNITION:  Overall cognitive status: Within functional limits for tasks assessed   PALPATION: Visible and palpable pocket of fluid present just superior to her axillary incision. It appears to be an organized seroma with thick fluid present. She also has 2 visible and palpable axillary cords  present (see photo) First photo 02/25/22       Second photo on 02/27/22    OBSERVATIONS / OTHER ASSESSMENTS: Positive upper limb tension testing likely due to axillary cording.  SENSATION: Light touch: Appears intact Stereognosis: Appears intact Hot/Cold: Appears intact Proprioception: Appears intact  POSTURE: Forward head, rounded shoulders   HAND DOMINANCE: Right  UPPER EXTREMITY AROM/PROM: All UE bilateral AROM is WNL   UPPER EXTREMITY STRENGTH: Functional  TODAY'S TREATMENT:                                                                                                                                         DATE:  03/07/22: Therapeutic Exercises Pulleys into flexion and abd returning therapist demo x2 mins each and VC's during to decrease Lt scapular compensation Roll yellow ball up wall into flexion x 10 and abd x 10  Modified downward dog on wall 5x, 5 sec holds and brief demo to remind pt of technique Standing with back against wall/ feet away from wall for bil UE into "snow angel" x 5, 5 sec holds returning therapist demo Manual Therapy MLD: Short neck, superficial and deep abdominals, Lt inguinal and Rt axillary and pectoral nodes, Lt axillo-inguinal and anterior inter-axillary anastomosis, and upper arm (lateral, medial to latera, then lateral again) and reidrecting . This was done throughout [facility]; briefly also posterior inter-axillary anastomosis after scap mobs then finished retracing anastomosis in supine MFR: To Lt axilla where cording is very mildly visible or palpable but pt does report still feeling the pull from  P/ROM: In  Supine during MFR to Lt shoulder into flexion, abd, and D2 into pts available end motions and with scapular depression by therapist throughout, her motion continued to be much improved after scap mobs and STM to Lt scapula Scap Mobs in Rt S/L to Lt scapula into protraction and retraction, pt with very limited mobility that did improve some by end of session, also prolonged holds with pulling scapular away from rib cage STM: In Rt S/L with cocoa butter to Lt periscapular region and UT for trigger point release  03/05/22: Therapeutic Exercises Pulleys into flexion and abd returning therapist demo x2 mins each and VC's during to decrease Lt scapular compensation Roll yellow ball up wall into flexion x10 and abd x5 returning therapist demo Modified downward dog on wall 5x, 5 sec holds returning therapist demo Manual Therapy MLD: Short neck, superficial and deep abdominals, Lt inguinal and Rt axillary and pectoral nodes, Lt axillo-inguinal and anterior inter-axillary anastomosis. This was done throughout [facility] MFR: To Lt axilla where cording is very mildly visible or palpable but pt does report still feeling the pull from  P/ROM: In Supine during MFR to Lt shoulder into flexion, abd, and D2 into pts available end motions and with scapular depression by therapist throughout, her motion was much improved after scap mobs and STM to Lt scapula Scap  Mobs in Rt S/L to Lt scapula into protraction and retraction, pt with very limited mobility that did improve some by end of session STM: In Rt S/L with cocoa butter to Lt periscapular region and UT for trigger point release  02/27/22: Manual Therapy MLD: Short neck, superficial and deep abdominals, Lt inguinal and Rt axillary and pectoral nodes, Lt axillo-inguinal and anterior inter-axillary anastomosis, then Lt breast and Lt upper arm (lateral, medial to lateral and lateral again) to promote lymphatic drainage, especially as seroma has reduced so much. This was done  throughout [facility] MFR: To Lt axilla where tightness from cording palpable but barely visible today, also into upper arm where tightness palpated P/ROM: In Supine during MFR to Lt shoulder into flexion and abd to pts available end motions  02/25/2022 Performed abbreviated manual lymph drainage focused on left axillary region: Short neck, right axillary and left inguinal region stimulated lymph nodes; anterior inter-axillary and left axillo-inguinal pathway; left lateral breast and axillary region redirecting along pathways. Pt in supine and draped appropriately with towel. Myofascial release to left axillary region where axillary cords are present. Passive prolonged stretch to left shoulder flexion and abduction in supine to pt tolerance to regain tissue release at end ROM and improve cording. Created and applied a chip pack to her left axillary region to break up thick edema. Also applied 12 cm Comprilan compression bandage around her chest (instructed her to doff bandage after 3 hours) to hold chip pack in place and to apply compression to the chip pack for improved effect.  PATIENT EDUCATION:  Education details: Instructed pt to wear her chip pack in her left axilla as tolerated to see if it reduced edema Person educated: Patient Education method: Explanation Education comprehension: verbalized understanding  HOME EXERCISE PROGRAM: None issued today  ASSESSMENT:  CLINICAL IMPRESSION: Continued with AA/ROM and manual therapy working to decrease Lt upper quadrant tightness. Pt is still limited by end motions due to muscle tightness and limited scapular mobility.   OBJECTIVE IMPAIRMENTS: increased edema and increased fascial restrictions.   ACTIVITY LIMITATIONS:  Not functionally limited; edema and cording increase her risk for lymphedema  and needs to be addressed a resolved.  PARTICIPATION LIMITATIONS:  none  PERSONAL FACTORS: 1 comorbidity: Currently undergoing chemotherapy  is also  affecting patient's functional outcome.   REHAB POTENTIAL: Excellent  CLINICAL DECISION MAKING: Stable/uncomplicated  EVALUATION COMPLEXITY: Low  GOALS: Goals reviewed with patient? Yes  LONG TERM GOALS: Target date: 03/25/2022    Patient will demonstrate her swelling in her left axilla has resolved so that she can wear a normal bra.  Goal status: INITIAL  2.  Patient will demonstrate proper scar massage and manual lymph drainage techniques for improving edema.  Goal status: INITIAL  3.  Patient will demonstrate axillary cording has resolved so there is no tightness present at end ROM for left shoulder.   Goal status: INITIAL  PLAN:  PT FREQUENCY: 3x/week  PT DURATION: 4 weeks  PLANNED INTERVENTIONS: Therapeutic exercises, Therapeutic activity, Patient/Family education, Self Care, Manual lymph drainage, Compression bandaging, scar mobilization, Manual therapy, and Re-evaluation  PLAN FOR NEXT SESSION: Add supine scapular series. Cont AA/ROM stretches and cont to address cording with myofascial release; manual lymph drainage prn, cont scap mobs and STM to periscapular area.   Collie Siad, PTA 03/07/22 11:07 AM;

## 2022-03-12 ENCOUNTER — Ambulatory Visit: Payer: 59

## 2022-03-12 DIAGNOSIS — R600 Localized edema: Secondary | ICD-10-CM

## 2022-03-12 DIAGNOSIS — C50212 Malignant neoplasm of upper-inner quadrant of left female breast: Secondary | ICD-10-CM

## 2022-03-12 DIAGNOSIS — Z483 Aftercare following surgery for neoplasm: Secondary | ICD-10-CM

## 2022-03-12 DIAGNOSIS — Z170 Estrogen receptor positive status [ER+]: Secondary | ICD-10-CM

## 2022-03-12 DIAGNOSIS — R293 Abnormal posture: Secondary | ICD-10-CM

## 2022-03-12 NOTE — Written Directive (Cosign Needed)
MOLECULAR IMAGING AND THERAPEUTICS WRITTEN DIRECTIVE   PATIENT NAME: [name]  PT DOB:   Oct 25, 1960                                              MRN: [id number]  ---------------------------------------------------------------------------------------------------------------------   I-131 THYROID CANCER THERAPY   RADIOPHARMACEUTICAL:  Iodine-131 Capsule    PRESCRIBED DOSE FOR ADMINISTRATION: 70  mCi   ROUTE OFADMINISTRATION:  PO   DIAGNOSIS: Papillary Thyroid Carcinoma   REFERRING PHYSICIAN: Dr. Ferne Reus STIMULATION OR HORMONE WITHDRAW: Thyrogen Stimulated   REMNANT ABLATION OR ADJUVANT THERAPY: Remnant Ablation   DATE OF THYROIDECTOMY: 01/01/2022   SURGEON: Dr. Harlow Asa   TSH:   Lab Results  Component Value Date   TSH 1.492 03/04/2022   TSH 1.77 02/01/2022   TSH 3.08 08/30/2021     PRIOR I-131 THERAPY (Date and Dose):   Pathology:  Cell type: '[x]'$   Papillary  '[]'$   Follicular  '[]'$   [place]   Largest tumor focus:     2  cm  Extrathyroidal Extension?     Yes '[x]'$   No '[]'$     Lymphovascular Invasion?  Yes  '[]'$   No  '[x]'$     Margins positive ? Yes '[]'$   No '[x]'$     Lymph nodes positive? Yes '[]'$   No  '[x]'$       # positive nodes:  0 # negative nodes:  2   TNM staging: pT:  2       PN:   0      Mx:    ADDITIONAL PHYSICIAN COMMENTS/NOTES Low risk disease. Remnant ablation.   2 cm tumor . Neg nodes... extrathyroidal extention w/ negative margins.  AUTHORIZED USER SIGNATURE & TIME STAMP: Rennis Golden, MD   03/13/22    5:27 PM

## 2022-03-12 NOTE — Therapy (Signed)
OUTPATIENT PHYSICAL THERAPY UPPER EXTREMITY LYMPHEDEMA TREATMENT  Patient Name: [name] MRN: [id number] DOB:1960-11-16, 62 y.o., female Today's Date: 03/12/2022  END OF SESSION:  PT End of Session - 03/12/22 1017     Visit Number 7    Number of Visits 15    Date for PT Re-Evaluation 03/25/22    PT Start Time 1012    PT Stop Time 1103    PT Time Calculation (min) 51 min    Activity Tolerance Patient tolerated treatment well    Behavior During Therapy WFL for tasks assessed/performed             Past Medical History:  Diagnosis Date   Anxiety    Allergy induced asthma   Arthritis    Asthma    Breast cancer ([place])    Crohn disease ([place])    Depression    GERD (gastroesophageal reflux disease)    History of methicillin resistant staphylococcus aureus (MRSA)    pt denies this.   Multiple thyroid nodules    Pneumonia    Sleep apnea    CPAP   Thyroid cancer [facility])    Past Surgical History:  Procedure Laterality Date   ANAL FISSURE REPAIR  2004   BREAST LUMPECTOMY WITH RADIOACTIVE SEED LOCALIZATION Left 01/01/2022   Procedure: LEFT BREAST SEED LUMPECTOMY;  Surgeon: Erroll Luna, MD;  Location: [place];  Service: General;  Laterality: Left;   CESAREAN SECTION     COLONOSCOPY     INSERTION OF MESH N/A 07/26/2020   Procedure: INSERTION OF MESH;  Surgeon: Ralene Ok, MD;  Location: [place];  Service: General;  Laterality: N/A;   LIGAMENT REPAIR Right 11/23/2015   Procedure: right index radial collateral LIGAMENT REPAIR;  Surgeon: Leanora Cover, MD;  Location: [place];  Service: Orthopedics;  Laterality: Right;   PORTACATH PLACEMENT Right 02/07/2022   Procedure: INSERTION PORT-A-CATH;  Surgeon: Erroll Luna, MD;  Location: [place];  Service: General;  Laterality: Right;   RE-EXCISION OF BREAST LUMPECTOMY Left 01/22/2022   Procedure: RE-EXCISION OF LEFT BREAST LUMPECTOMY;  Surgeon: Erroll Luna, MD;  Location: [place];  Service:  General;  Laterality: Left;   SENTINEL NODE BIOPSY Left 01/22/2022   Procedure: LEFT SENTINEL NODE BIOPSY;  Surgeon: Erroll Luna, MD;  Location: [place];  Service: General;  Laterality: Left;   THYROIDECTOMY N/A 01/01/2022   Procedure: TOTAL THYROIDECTOMY WITH LIMITED LYMPH NODE DISSECTION;  Surgeon: Armandina Gemma, MD;  Location: [place];  Service: General;  Laterality: N/A;   UPPER ENDOSCOPY W/ ESOPHAGEAL MANOMETRY  10/2021   VENTRAL HERNIA REPAIR N/A 07/26/2020   Procedure: LAPAROSCOPIC VENTRAL HERNIA REPAIR WITH MESH;  Surgeon: Ralene Ok, MD;  Location: [place];  Service: General;  Laterality: N/A;   Patient Active Problem List   Diagnosis Date Noted   Malignant neoplasm of upper-inner quadrant of left breast in female, estrogen receptor positive ([place]) 01/09/2022   Postsurgical hypothyroidism 01/04/2022   Multiple thyroid nodules 12/20/2021   Papillary thyroid carcinoma ([place]) 12/20/2021   Nodule of lower lobe of right lung 10/17/2021   Left upper lobe pulmonary nodule 10/17/2021   Acromioclavicular sprain, left, initial encounter 07/27/2021   Tibialis posterior tendinitis, right 06/15/2020   Piriformis syndrome of left side [id number]   Umbilical hernia [id number]   Lumbar radiculopathy 07/09/2019   Contusion of right hip and thigh 04/20/2019   Loss of transverse plantar arch 02/11/2019   Sprain of iliolumbar ligament 10/29/2018   Piriformis syndrome  of right side 09/30/2018   Nonallopathic lesion of sacral region 09/30/2018   Nonallopathic lesion of lumbosacral region 09/30/2018   Nonallopathic lesion of thoracic region 09/30/2018   Peroneal tendinitis of left lower extremity 02/11/2018   Polyarthralgia 02/11/2018   Patellofemoral syndrome of right knee 02/11/2018   Gastroesophageal reflux disease 01/16/2017   Cough variant asthma 01/16/2017   Noncompliance with medication treatment due to intermittent use of medication 01/16/2017   Allergic  rhinoconjunctivitis 11/08/2015   Sinobronchitis 10/03/2015   OSA (obstructive sleep apnea) 12/14/2014   Crohn's disease of colon ([place]) 05/24/2014   Depression 02/01/2014   Disorder of intestine 12/17/2010   Anorectal polyp 12/17/2010   Glaucoma 01/14/2010   Dry skin dermatitis 01/15/2008   Crohn's disease ([place]) 01/14/2005    REFERRING PROVIDER: Dr. Nicholas Lose  REFERRING DIAG: Axillary swelling  THERAPY DIAG:  Malignant neoplasm of upper-inner quadrant of left breast in female, estrogen receptor positive ([place])  Aftercare following surgery for neoplasm  Abnormal posture  Localized edema  ONSET DATE: 02/18/2022 for edema increase  Rationale for Evaluation and Treatment: Rehabilitation  SUBJECTIVE                                                                                                                                                                                           SUBJECTIVE STATEMENT: I'm doing really well.   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 It measures 1.5 cm and is located in the upper inner quadrant with IDC only being present in lateral margin. It is ER/PR positive and HER2 negative with a Ki-67 of 5%. Thyroidectomy was also performed at the same time and 2 lymph nodes removed (nodes were negative) and all gross disease removed. Thyroid cancer will be treated with radioactive iodine.    PAIN:  Are you having pain? No, not currently  PRECAUTIONS: Other: Currently undergoing chemotherapy  WEIGHT BEARING RESTRICTIONS: No  FALLS:  Has patient fallen in last 6 months? No  LIVING ENVIRONMENT: Lives with: lives with their family Lives in: House/apartment Has following equipment at home: None  OCCUPATION: Retired  LEISURE: She is walking an hour 5x/week  HAND DOMINANCE : right   PRIOR LEVEL OF FUNCTION: Independent  PATIENT GOALS: Reduce axillary edema  for improved comfort   OBJECTIVE  COGNITION:  Overall cognitive status: Within functional limits for tasks assessed   PALPATION: Visible and palpable pocket of fluid present just superior to her axillary incision. It appears to be an organized seroma with thick fluid present. She also has 2 visible and palpable axillary cords present (see  photo) First photo 02/25/22       Second photo on 02/27/22    OBSERVATIONS / OTHER ASSESSMENTS: Positive upper limb tension testing likely due to axillary cording.  SENSATION: Light touch: Appears intact Stereognosis: Appears intact Hot/Cold: Appears intact Proprioception: Appears intact  POSTURE: Forward head, rounded shoulders   HAND DOMINANCE: Right  UPPER EXTREMITY AROM/PROM: All UE bilateral AROM is WNL   UPPER EXTREMITY STRENGTH: Functional  TODAY'S TREATMENT:                                                                                                                                         DATE:   03/12/22: Therapeutic Exercises Pulleys into flexion and abd returning therapist demo x2 mins each and VC's during to decrease Lt scapular compensation Roll yellow ball up wall into flexion x 10 and abd x 10 with 1# added to wrist Supine Scapular Series with yellow theraband x 10 each , except wide grip and D2 x5 each due to fatigue, returning therapist demo, handout issued for HEP Manual Therapy MFR: To Lt axilla where cording is very mildly visible or palpable but pt does report still feeling the pull from  P/ROM: In Supine during MFR to Lt shoulder into flexion, abd, and D2 into pts available end motions and with scapular depression by therapist throughout, her motion continued to be much improved after scap mobs and STM to Lt scapula   03/07/22: Therapeutic Exercises Pulleys into flexion and abd returning therapist demo x2 mins each and VC's during to decrease Lt scapular compensation Roll yellow ball up wall into flexion x 10 and  abd x 10  Modified downward dog on wall 5x, 5 sec holds and brief demo to remind pt of technique Standing with back against wall/ feet away from wall for bil UE into "snow angel" x 5, 5 sec holds returning therapist demo Manual Therapy MLD: Short neck, superficial and deep abdominals, Lt inguinal and Rt axillary and pectoral nodes, Lt axillo-inguinal and anterior inter-axillary anastomosis, and upper arm (lateral, medial to latera, then lateral again) and reidrecting . This was done throughout [facility]; briefly also posterior inter-axillary anastomosis after scap mobs then finished retracing anastomosis in supine MFR: To Lt axilla where cording is very mildly visible or palpable but pt does report still feeling the pull from  P/ROM: In Supine during MFR to Lt shoulder into flexion, abd, and D2 into pts available end motions and with scapular depression by therapist throughout, her motion continued to be much improved after scap mobs and STM to Lt scapula Scap Mobs in Rt S/L to Lt scapula into protraction and retraction, pt with very limited mobility that did improve some by end of session, also prolonged holds with pulling scapular away from rib cage STM: In Rt S/L with cocoa butter to Lt periscapular region and UT for trigger point release  03/05/22: Therapeutic Exercises Pulleys into  flexion and abd returning therapist demo x2 mins each and VC's during to decrease Lt scapular compensation Roll yellow ball up wall into flexion x10 and abd x5 returning therapist demo Modified downward dog on wall 5x, 5 sec holds returning therapist demo Manual Therapy MLD: Short neck, superficial and deep abdominals, Lt inguinal and Rt axillary and pectoral nodes, Lt axillo-inguinal and anterior inter-axillary anastomosis. This was done throughout [facility] MFR: To Lt axilla where cording is very mildly visible or palpable but pt does report still feeling the pull from  P/ROM: In Supine during MFR to Lt shoulder into flexion,  abd, and D2 into pts available end motions and with scapular depression by therapist throughout, her motion was much improved after scap mobs and STM to Lt scapula Scap Mobs in Rt S/L to Lt scapula into protraction and retraction, pt with very limited mobility that did improve some by end of session STM: In Rt S/L with cocoa butter to Lt periscapular region and UT for trigger point release  02/27/22: Manual Therapy MLD: Short neck, superficial and deep abdominals, Lt inguinal and Rt axillary and pectoral nodes, Lt axillo-inguinal and anterior inter-axillary anastomosis, then Lt breast and Lt upper arm (lateral, medial to lateral and lateral again) to promote lymphatic drainage, especially as seroma has reduced so much. This was done throughout [facility] MFR: To Lt axilla where tightness from cording palpable but barely visible today, also into upper arm where tightness palpated P/ROM: In Supine during MFR to Lt shoulder into flexion and abd to pts available end motions    PATIENT EDUCATION:  Education details: Supine scapular series Person educated: Patient Education method: Explanation, Demonstration, VC's and handout issued Education comprehension: verbalized understanding, pt returned demonstration and will benefit from review  HOME EXERCISE PROGRAM: None issued today  ASSESSMENT:  CLINICAL IMPRESSION: Pt continues making excellent progress so progressed her HEP to include supine scapular series with yellow theraband. She demonstrated some fatigue with this so last few exs only did 5.   OBJECTIVE IMPAIRMENTS: increased edema and increased fascial restrictions.   ACTIVITY LIMITATIONS:  Not functionally limited; edema and cording increase her risk for lymphedema  and needs to be addressed a resolved.  PARTICIPATION LIMITATIONS:  none  PERSONAL FACTORS: 1 comorbidity: Currently undergoing chemotherapy  is also affecting patient's functional outcome.   REHAB POTENTIAL: Excellent  CLINICAL  DECISION MAKING: Stable/uncomplicated  EVALUATION COMPLEXITY: Low  GOALS: Goals reviewed with patient? Yes  LONG TERM GOALS: Target date: 03/25/2022    Patient will demonstrate her swelling in her left axilla has resolved so that she can wear a normal bra.  Goal status: INITIAL  2.  Patient will demonstrate proper scar massage and manual lymph drainage techniques for improving edema.  Goal status: INITIAL  3.  Patient will demonstrate axillary cording has resolved so there is no tightness present at end ROM for left shoulder.   Goal status: INITIAL  PLAN:  PT FREQUENCY: 3x/week  PT DURATION: 4 weeks  PLANNED INTERVENTIONS: Therapeutic exercises, Therapeutic activity, Patient/Family education, Self Care, Manual lymph drainage, Compression bandaging, scar mobilization, Manual therapy, and Re-evaluation  PLAN FOR NEXT SESSION: Add supine scapular series. Cont AA/ROM stretches and cont to address cording with myofascial release; manual lymph drainage prn, cont scap mobs and STM to periscapular area.   Collie Siad, PTA 03/12/22 11:07 AM   Over Head Pull: Narrow and Wide Grip   Cancer Rehab [phone]   On back, knees bent, feet flat, band across thighs, elbows straight but  relaxed. Pull hands apart (start). Keeping elbows straight, bring arms up and over head, hands toward floor. Keep pull steady on band. Hold momentarily. Return slowly, keeping pull steady, back to start. Then do same with a wider grip on the band (past shoulder width) Repeat _5-10__ times. Band color __yellow____   Side Pull: Double Arm   On back, knees bent, feet flat. Arms perpendicular to body, shoulder level, elbows straight but relaxed. Pull arms out to sides, elbows straight. Resistance band comes across collarbones, hands toward floor. Hold momentarily. Slowly return to starting position. Repeat _5-10__ times. Band color _yellow____   Sword   On back, knees bent, feet flat, left hand on left  hip, right hand above left. Pull right arm DIAGONALLY (hip to shoulder) across chest. Bring right arm along head toward floor. Hold momentarily. Slowly return to starting position. Repeat _5-10__ times. Do with left arm. Band color _yellow_____   Shoulder Rotation: Double Arm   On back, knees bent, feet flat, elbows tucked at sides, bent 90, hands palms up. Pull hands apart and down toward floor, keeping elbows near sides. Hold momentarily. Slowly return to starting position. Repeat _5-10__ times. Band color __yellow____

## 2022-03-12 NOTE — Patient Instructions (Signed)
Over Head Pull: Narrow and Wide Grip   Cancer Rehab [phone]   On back, knees bent, feet flat, band across thighs, elbows straight but relaxed. Pull hands apart (start). Keeping elbows straight, bring arms up and over head, hands toward floor. Keep pull steady on band. Hold momentarily. Return slowly, keeping pull steady, back to start. Then do same with a wider grip on the band (past shoulder width) Repeat _5-10__ times. Band color __yellow____   Side Pull: Double Arm   On back, knees bent, feet flat. Arms perpendicular to body, shoulder level, elbows straight but relaxed. Pull arms out to sides, elbows straight. Resistance band comes across collarbones, hands toward floor. Hold momentarily. Slowly return to starting position. Repeat _5-10__ times. Band color _yellow____   Sword   On back, knees bent, feet flat, left hand on left hip, right hand above left. Pull right arm DIAGONALLY (hip to shoulder) across chest. Bring right arm along head toward floor. Hold momentarily. Slowly return to starting position. Repeat _5-10__ times. Do with left arm. Band color _yellow_____   Shoulder Rotation: Double Arm   On back, knees bent, feet flat, elbows tucked at sides, bent 90, hands palms up. Pull hands apart and down toward floor, keeping elbows near sides. Hold momentarily. Slowly return to starting position. Repeat _5-10__ times. Band color __yellow____

## 2022-03-13 ENCOUNTER — Other Ambulatory Visit: Payer: Self-pay

## 2022-03-13 MED ORDER — LEVOTHYROXINE SODIUM 50 MCG PO TABS
100.0000 ug | ORAL_TABLET | Freq: Every day | ORAL | 3 refills | Status: DC
  Filled 2022-03-21: qty 60, 30d supply, fill #0

## 2022-03-13 NOTE — Written Directive (Cosign Needed)
MOLECULAR IMAGING AND THERAPEUTICS WRITTEN DIRECTIVE   PATIENT NAME: [name]  PT DOB:   November 28, 1960                                              MRN: [id number]  ---------------------------------------------------------------------------------------------------------------------   I-131 WHOLE THYROID THERAPY (NON-CANCER)    RADIOPHARMACEUTICAL:   Iodine-131 Capsule    PRESCRIBED DOSE FOR ADMINISTRATION:    ROUTE OFADMINISTRATION: PO   DIAGNOSIS:  Papillary thyroid carcinoma   REFERRING PHYSICIAN: Dr. Cruzita Lederer   TSH:   1.492 as of 03/04/2022 Lab Results  Component Value Date   TSH 1.492 03/04/2022   TSH 1.77 02/01/2022   TSH 3.08 08/30/2021     PRIOR I-131 THERAPY (Date and Dose):   PRIOR RADIOLOGY EXAMS (Results and Date): US THYROID  Result Date: 11/08/2021 CLINICAL DATA:  Goiter. EXAM: THYROID ULTRASOUND TECHNIQUE: Ultrasound examination of the thyroid gland and adjacent soft tissues was performed. COMPARISON:  None Available. FINDINGS: Parenchymal Echotexture: Mildly heterogenous Isthmus: 0.2 cm Right lobe: 4.2 x 1.7 x 2.0 cm Left lobe: 3.8 x 1.3 x 1.2 cm _________________________________________________________ Estimated total number of nodules >/= 1 cm: 2 Number of spongiform nodules >/=  2 cm not described below (TR1): 0 Number of mixed cystic and solid nodules >/= 1.5 cm not described below (TR2): 0 _________________________________________________________ Nodule # 1: Location: Right; Mid Maximum size: 2.2 cm; Other 2 dimensions: 2.1 x 1.7 cm Composition: solid/almost completely solid (2) Echogenicity: hypoechoic (2) Shape: not taller-than-wide (0) Margins: lobulated/irregular (2) Echogenic foci: macrocalcifications (1) ACR TI-RADS total points: 7. ACR TI-RADS risk category: TR5 (>/= 7 points). ACR TI-RADS recommendations: **Given size (>/= 1.0 cm) and appearance, fine needle aspiration of this highly suspicious nodule should be considered based on TI-RADS  criteria. _________________________________________________________ Nodule # 2: Location: Right; Mid Maximum size: 1.3 cm; Other 2 dimensions: 1.3 x 1.0 cm Composition: solid/almost completely solid (2) Echogenicity: hypoechoic (2) Shape: not taller-than-wide (0) Margins: smooth (0) Echogenic foci: none (0) ACR TI-RADS total points: 4. ACR TI-RADS risk category: TR4 (4-6 points). ACR TI-RADS recommendations: *Given size (>/= 1 - 1.4 cm) and appearance, a follow-up ultrasound in 1 year should be considered based on TI-RADS criteria. _________________________________________________________ Nodule # 3: Small mixed cystic and solid nodule in the left mid gland does not warrant further evaluation. IMPRESSION: 1. Approximately 2.2 cm TI-RADS category 5 nodule in the right upper gland meets criteria to consider fine-needle aspiration biopsy. Biopsy is recommended. 2. A 1.3 cm TI-RADS category 4 nodule in the right mid gland meets criteria for imaging surveillance. Recommend follow-up ultrasound in 1 year. The above is in keeping with the ACR TI-RADS recommendations - J Am Coll Radiol 2017;14:[id number]. Electronically Signed   By: Jacqulynn Cadet M.D.   On: 11/08/2021 15:56      ADDITIONAL PHYSICIAN COMMENTS/NOTES   AUTHORIZED USER SIGNATURE & TIME STAMP:

## 2022-03-14 ENCOUNTER — Ambulatory Visit: Payer: 59

## 2022-03-14 DIAGNOSIS — Z170 Estrogen receptor positive status [ER+]: Secondary | ICD-10-CM

## 2022-03-14 DIAGNOSIS — C50212 Malignant neoplasm of upper-inner quadrant of left female breast: Secondary | ICD-10-CM | POA: Diagnosis not present

## 2022-03-14 DIAGNOSIS — Z483 Aftercare following surgery for neoplasm: Secondary | ICD-10-CM

## 2022-03-14 DIAGNOSIS — R600 Localized edema: Secondary | ICD-10-CM

## 2022-03-14 DIAGNOSIS — R293 Abnormal posture: Secondary | ICD-10-CM

## 2022-03-14 NOTE — Therapy (Addendum)
OUTPATIENT PHYSICAL THERAPY UPPER EXTREMITY LYMPHEDEMA TREATMENT  Patient Name: [name] MRN: [id number] DOB:July 28, 1960, 62 y.o., female Today's Date: 03/14/2022  END OF SESSION:  PT End of Session - 03/14/22 0910     Visit Number 8    Number of Visits 15    Date for PT Re-Evaluation 03/25/22    PT Start Time 0905    PT Stop Time 1002    PT Time Calculation (min) 57 min    Activity Tolerance Patient tolerated treatment well    Behavior During Therapy WFL for tasks assessed/performed             Past Medical History:  Diagnosis Date   Anxiety    Allergy induced asthma   Arthritis    Asthma    Breast cancer ([place])    Crohn disease ([place])    Depression    GERD (gastroesophageal reflux disease)    History of methicillin resistant staphylococcus aureus (MRSA)    pt denies this.   Multiple thyroid nodules    Pneumonia    Sleep apnea    CPAP   Thyroid cancer [facility])    Past Surgical History:  Procedure Laterality Date   ANAL FISSURE REPAIR  2004   BREAST LUMPECTOMY WITH RADIOACTIVE SEED LOCALIZATION Left 01/01/2022   Procedure: LEFT BREAST SEED LUMPECTOMY;  Surgeon: Erroll Luna, MD;  Location: [place];  Service: General;  Laterality: Left;   CESAREAN SECTION     COLONOSCOPY     INSERTION OF MESH N/A 07/26/2020   Procedure: INSERTION OF MESH;  Surgeon: Ralene Ok, MD;  Location: [place];  Service: General;  Laterality: N/A;   LIGAMENT REPAIR Right 11/23/2015   Procedure: right index radial collateral LIGAMENT REPAIR;  Surgeon: Leanora Cover, MD;  Location: [place];  Service: Orthopedics;  Laterality: Right;   PORTACATH PLACEMENT Right 02/07/2022   Procedure: INSERTION PORT-A-CATH;  Surgeon: Erroll Luna, MD;  Location: [place];  Service: General;  Laterality: Right;   RE-EXCISION OF BREAST LUMPECTOMY Left 01/22/2022   Procedure: RE-EXCISION OF LEFT BREAST LUMPECTOMY;  Surgeon: Erroll Luna, MD;  Location: [place];  Service:  General;  Laterality: Left;   SENTINEL NODE BIOPSY Left 01/22/2022   Procedure: LEFT SENTINEL NODE BIOPSY;  Surgeon: Erroll Luna, MD;  Location: [place];  Service: General;  Laterality: Left;   THYROIDECTOMY N/A 01/01/2022   Procedure: TOTAL THYROIDECTOMY WITH LIMITED LYMPH NODE DISSECTION;  Surgeon: Armandina Gemma, MD;  Location: [place];  Service: General;  Laterality: N/A;   UPPER ENDOSCOPY W/ ESOPHAGEAL MANOMETRY  10/2021   VENTRAL HERNIA REPAIR N/A 07/26/2020   Procedure: LAPAROSCOPIC VENTRAL HERNIA REPAIR WITH MESH;  Surgeon: Ralene Ok, MD;  Location: [place];  Service: General;  Laterality: N/A;   Patient Active Problem List   Diagnosis Date Noted   Malignant neoplasm of upper-inner quadrant of left breast in female, estrogen receptor positive ([place]) 01/09/2022   Postsurgical hypothyroidism 01/04/2022   Multiple thyroid nodules 12/20/2021   Papillary thyroid carcinoma ([place]) 12/20/2021   Nodule of lower lobe of right lung 10/17/2021   Left upper lobe pulmonary nodule 10/17/2021   Acromioclavicular sprain, left, initial encounter 07/27/2021   Tibialis posterior tendinitis, right 06/15/2020   Piriformis syndrome of left side [id number]   Umbilical hernia [id number]   Lumbar radiculopathy 07/09/2019   Contusion of right hip and thigh 04/20/2019   Loss of transverse plantar arch 02/11/2019   Sprain of iliolumbar ligament 10/29/2018   Piriformis syndrome  of right side 09/30/2018   Nonallopathic lesion of sacral region 09/30/2018   Nonallopathic lesion of lumbosacral region 09/30/2018   Nonallopathic lesion of thoracic region 09/30/2018   Peroneal tendinitis of left lower extremity 02/11/2018   Polyarthralgia 02/11/2018   Patellofemoral syndrome of right knee 02/11/2018   Gastroesophageal reflux disease 01/16/2017   Cough variant asthma 01/16/2017   Noncompliance with medication treatment due to intermittent use of medication 01/16/2017   Allergic  rhinoconjunctivitis 11/08/2015   Sinobronchitis 10/03/2015   OSA (obstructive sleep apnea) 12/14/2014   Crohn's disease of colon ([place]) 05/24/2014   Depression 02/01/2014   Disorder of intestine 12/17/2010   Anorectal polyp 12/17/2010   Glaucoma 01/14/2010   Dry skin dermatitis 01/15/2008   Crohn's disease ([place]) 01/14/2005    REFERRING PROVIDER: Dr. Nicholas Lose  REFERRING DIAG: Axillary swelling  THERAPY DIAG:  Malignant neoplasm of upper-inner quadrant of left breast in female, estrogen receptor positive ([place])  Aftercare following surgery for neoplasm  Abnormal posture  Localized edema  ONSET DATE: 02/18/2022 for edema increase  Rationale for Evaluation and Treatment: Rehabilitation  SUBJECTIVE                                                                                                                                                                                           SUBJECTIVE STATEMENT: The upper trap is feeling tight today.   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 It measures 1.5 cm and is located in the upper inner quadrant with IDC only being present in lateral margin. It is ER/PR positive and HER2 negative with a Ki-67 of 5%. Thyroidectomy was also performed at the same time and 2 lymph nodes removed (nodes were negative) and all gross disease removed. Thyroid cancer will be treated with radioactive iodine.    PAIN:  Are you having pain? No, not currently  PRECAUTIONS: Other: Currently undergoing chemotherapy  WEIGHT BEARING RESTRICTIONS: No  FALLS:  Has patient fallen in last 6 months? No  LIVING ENVIRONMENT: Lives with: lives with their family Lives in: House/apartment Has following equipment at home: None  OCCUPATION: Retired  LEISURE: She is walking an hour 5x/week  HAND DOMINANCE : right   PRIOR LEVEL OF FUNCTION: Independent  PATIENT GOALS: Reduce  axillary edema for improved comfort   OBJECTIVE  COGNITION:  Overall cognitive status: Within functional limits for tasks assessed   PALPATION: Visible and palpable pocket of fluid present just superior to her axillary incision. It appears to be an organized seroma with thick fluid present. She also has 2 visible and palpable axillary  cords present (see photo) First photo 02/25/22       Second photo on 02/27/22    OBSERVATIONS / OTHER ASSESSMENTS: Positive upper limb tension testing likely due to axillary cording.  SENSATION: Light touch: Appears intact Stereognosis: Appears intact Hot/Cold: Appears intact Proprioception: Appears intact  POSTURE: Forward head, rounded shoulders   HAND DOMINANCE: Right  UPPER EXTREMITY AROM/PROM: All UE bilateral AROM is WNL   UPPER EXTREMITY STRENGTH: Functional  TODAY'S TREATMENT:                                                                                                                                         DATE:  03/14/22: Therapeutic Exercises Pulleys into flexion and abd returning therapist demo x2 mins each and VC's during to decrease Lt scapular compensation Roll yellow ball up wall into flexion x 10 and abd x 10  Modified downward dog on wall 5x, 5 sec holds returning therapist demo [employer] machine for scapular retraction 3#, x10 returning therapist demo. VC's for core engaged during Supine Scapular Series with 1# x10 each, pt did very well returning correct technique Manual Therapy MFR: To Lt axilla where cording is very mildly visible or palpable but pt does report still feeling the pull from  P/ROM: In Supine during MFR to Lt shoulder into flexion, abd, and D2 into pts available end motions and with scapular depression by therapist throughout, her motion continued to be much improved after scap mobs and STM to Lt scapula STM with cocoa butter to Rt upper trap where pt palpably tight and trigger points  palpated  03/12/22: Therapeutic Exercises Pulleys into flexion and abd returning therapist demo x2 mins each and VC's during to decrease Lt scapular compensation Roll yellow ball up wall into flexion x 10 and abd x 10 with 1# added to wrist Supine Scapular Series with yellow theraband x 10 each , except wide grip and D2 x5 each due to fatigue, returning therapist demo, handout issued for HEP Manual Therapy MFR: To Lt axilla where cording is very mildly visible or palpable but pt does report still feeling the pull from  P/ROM: In Supine during MFR to Lt shoulder into flexion, abd, and D2 into pts available end motions and with scapular depression by therapist throughout, her motion continued to be much improved after scap mobs and STM to Lt scapula   03/07/22: Therapeutic Exercises Pulleys into flexion and abd returning therapist demo x2 mins each and VC's during to decrease Lt scapular compensation Roll yellow ball up wall into flexion x 10 and abd x 10  Modified downward dog on wall 5x, 5 sec holds and brief demo to remind pt of technique Standing with back against wall/ feet away from wall for bil UE into "snow angel" x 5, 5 sec holds returning therapist demo Manual Therapy MLD: Short neck, superficial and deep abdominals, Lt inguinal and Rt axillary and  pectoral nodes, Lt axillo-inguinal and anterior inter-axillary anastomosis, and upper arm (lateral, medial to latera, then lateral again) and reidrecting . This was done throughout [facility]; briefly also posterior inter-axillary anastomosis after scap mobs then finished retracing anastomosis in supine MFR: To Lt axilla where cording is very mildly visible or palpable but pt does report still feeling the pull from  P/ROM: In Supine during MFR to Lt shoulder into flexion, abd, and D2 into pts available end motions and with scapular depression by therapist throughout, her motion continued to be much improved after scap mobs and STM to Lt scapula Scap  Mobs in Rt S/L to Lt scapula into protraction and retraction, pt with very limited mobility that did improve some by end of session, also prolonged holds with pulling scapular away from rib cage STM: In Rt S/L with cocoa butter to Lt periscapular region and UT for trigger point release     PATIENT EDUCATION:  Education details: Supine scapular series Person educated: Patient Education method: Explanation, Demonstration, VC's and handout issued Education comprehension: verbalized understanding, pt returned demonstration and will benefit from review  HOME EXERCISE PROGRAM: None issued today  ASSESSMENT:  CLINICAL IMPRESSION: Pt is progressing well with increasing strength without increased pain. Her cording is improving with manual therapy and stretching at home. She was able to tolerate gentle increase with strengthening today as well and reports feeling good being able to do these activities.   OBJECTIVE IMPAIRMENTS: increased edema and increased fascial restrictions.   ACTIVITY LIMITATIONS:  Not functionally limited; edema and cording increase her risk for lymphedema  and needs to be addressed a resolved.  PARTICIPATION LIMITATIONS:  none  PERSONAL FACTORS: 1 comorbidity: Currently undergoing chemotherapy  is also affecting patient's functional outcome.   REHAB POTENTIAL: Excellent  CLINICAL DECISION MAKING: Stable/uncomplicated  EVALUATION COMPLEXITY: Low  GOALS: Goals reviewed with patient? Yes  LONG TERM GOALS: Target date: 03/25/2022    Patient will demonstrate her swelling in her left axilla has resolved so that she can wear a normal bra.  Goal status: INITIAL  2.  Patient will demonstrate proper scar massage and manual lymph drainage techniques for improving edema.  Goal status: INITIAL  3.  Patient will demonstrate axillary cording has resolved so there is no tightness present at end ROM for left shoulder.   Goal status: INITIAL  PLAN:  PT FREQUENCY:  3x/week  PT DURATION: 4 weeks  PLANNED INTERVENTIONS: Therapeutic exercises, Therapeutic activity, Patient/Family education, Self Care, Manual lymph drainage, Compression bandaging, scar mobilization, Manual therapy, and Re-evaluation  PLAN FOR NEXT SESSION: Review supine scapular series prn. Cont AA/ROM stretches and cont to address cording with myofascial release; manual lymph drainage prn, cont scap mobs and STM to periscapular area.   Collie Siad, PTA 03/14/22 10:05 AM   Over Head Pull: Narrow and Wide Grip   Cancer Rehab [phone]   On back, knees bent, feet flat, band across thighs, elbows straight but relaxed. Pull hands apart (start). Keeping elbows straight, bring arms up and over head, hands toward floor. Keep pull steady on band. Hold momentarily. Return slowly, keeping pull steady, back to start. Then do same with a wider grip on the band (past shoulder width) Repeat _5-10__ times. Band color __yellow____   Side Pull: Double Arm   On back, knees bent, feet flat. Arms perpendicular to body, shoulder level, elbows straight but relaxed. Pull arms out to sides, elbows straight. Resistance band comes across collarbones, hands toward floor. Hold momentarily. Slowly return to starting  position. Repeat _5-10__ times. Band color _yellow____   Sword   On back, knees bent, feet flat, left hand on left hip, right hand above left. Pull right arm DIAGONALLY (hip to shoulder) across chest. Bring right arm along head toward floor. Hold momentarily. Slowly return to starting position. Repeat _5-10__ times. Do with left arm. Band color _yellow_____   Shoulder Rotation: Double Arm   On back, knees bent, feet flat, elbows tucked at sides, bent 90, hands palms up. Pull hands apart and down toward floor, keeping elbows near sides. Hold momentarily. Slowly return to starting position. Repeat _5-10__ times. Band color __yellow____

## 2022-03-18 ENCOUNTER — Other Ambulatory Visit (HOSPITAL_COMMUNITY): Payer: Self-pay

## 2022-03-18 ENCOUNTER — Ambulatory Visit: Payer: 59 | Attending: Hematology and Oncology

## 2022-03-18 DIAGNOSIS — R293 Abnormal posture: Secondary | ICD-10-CM | POA: Insufficient documentation

## 2022-03-18 DIAGNOSIS — Z483 Aftercare following surgery for neoplasm: Secondary | ICD-10-CM | POA: Insufficient documentation

## 2022-03-18 DIAGNOSIS — C50212 Malignant neoplasm of upper-inner quadrant of left female breast: Secondary | ICD-10-CM | POA: Diagnosis not present

## 2022-03-18 DIAGNOSIS — Z170 Estrogen receptor positive status [ER+]: Secondary | ICD-10-CM | POA: Diagnosis not present

## 2022-03-18 DIAGNOSIS — R600 Localized edema: Secondary | ICD-10-CM | POA: Diagnosis not present

## 2022-03-18 MED ORDER — DOXYCYCLINE MONOHYDRATE 100 MG PO CAPS
100.0000 mg | ORAL_CAPSULE | Freq: Two times a day (BID) | ORAL | 0 refills | Status: DC
  Filled 2022-03-18: qty 14, 7d supply, fill #0

## 2022-03-18 NOTE — Therapy (Signed)
OUTPATIENT PHYSICAL THERAPY UPPER EXTREMITY LYMPHEDEMA TREATMENT  Patient Name: [name] MRN: [id number] DOB:04-Jan-1961, 62 y.o., female Today's Date: 03/18/2022  END OF SESSION:  PT End of Session - 03/18/22 1104     Visit Number 9    Number of Visits 15    Date for PT Re-Evaluation 03/25/22    PT Start Time 1101    PT Stop Time 1157    PT Time Calculation (min) 56 min    Activity Tolerance Patient tolerated treatment well    Behavior During Therapy WFL for tasks assessed/performed             Past Medical History:  Diagnosis Date   Anxiety    Allergy induced asthma   Arthritis    Asthma    Breast cancer ([place])    Crohn disease ([place])    Depression    GERD (gastroesophageal reflux disease)    History of methicillin resistant staphylococcus aureus (MRSA)    pt denies this.   Multiple thyroid nodules    Pneumonia    Sleep apnea    CPAP   Thyroid cancer [facility])    Past Surgical History:  Procedure Laterality Date   ANAL FISSURE REPAIR  2004   BREAST LUMPECTOMY WITH RADIOACTIVE SEED LOCALIZATION Left 01/01/2022   Procedure: LEFT BREAST SEED LUMPECTOMY;  Surgeon: Erroll Luna, MD;  Location: [place];  Service: General;  Laterality: Left;   CESAREAN SECTION     COLONOSCOPY     INSERTION OF MESH N/A 07/26/2020   Procedure: INSERTION OF MESH;  Surgeon: Ralene Ok, MD;  Location: [place];  Service: General;  Laterality: N/A;   LIGAMENT REPAIR Right 11/23/2015   Procedure: right index radial collateral LIGAMENT REPAIR;  Surgeon: Leanora Cover, MD;  Location: [place];  Service: Orthopedics;  Laterality: Right;   PORTACATH PLACEMENT Right 02/07/2022   Procedure: INSERTION PORT-A-CATH;  Surgeon: Erroll Luna, MD;  Location: [place];  Service: General;  Laterality: Right;   RE-EXCISION OF BREAST LUMPECTOMY Left 01/22/2022   Procedure: RE-EXCISION OF LEFT BREAST LUMPECTOMY;  Surgeon: Erroll Luna, MD;  Location: [place];  Service:  General;  Laterality: Left;   SENTINEL NODE BIOPSY Left 01/22/2022   Procedure: LEFT SENTINEL NODE BIOPSY;  Surgeon: Erroll Luna, MD;  Location: [place];  Service: General;  Laterality: Left;   THYROIDECTOMY N/A 01/01/2022   Procedure: TOTAL THYROIDECTOMY WITH LIMITED LYMPH NODE DISSECTION;  Surgeon: Armandina Gemma, MD;  Location: [place];  Service: General;  Laterality: N/A;   UPPER ENDOSCOPY W/ ESOPHAGEAL MANOMETRY  10/2021   VENTRAL HERNIA REPAIR N/A 07/26/2020   Procedure: LAPAROSCOPIC VENTRAL HERNIA REPAIR WITH MESH;  Surgeon: Ralene Ok, MD;  Location: [place];  Service: General;  Laterality: N/A;   Patient Active Problem List   Diagnosis Date Noted   Malignant neoplasm of upper-inner quadrant of left breast in female, estrogen receptor positive ([place]) 01/09/2022   Postsurgical hypothyroidism 01/04/2022   Multiple thyroid nodules 12/20/2021   Papillary thyroid carcinoma ([place]) 12/20/2021   Nodule of lower lobe of right lung 10/17/2021   Left upper lobe pulmonary nodule 10/17/2021   Acromioclavicular sprain, left, initial encounter 07/27/2021   Tibialis posterior tendinitis, right 06/15/2020   Piriformis syndrome of left side [id number]   Umbilical hernia [id number]   Lumbar radiculopathy 07/09/2019   Contusion of right hip and thigh 04/20/2019   Loss of transverse plantar arch 02/11/2019   Sprain of iliolumbar ligament 10/29/2018   Piriformis syndrome  of right side 09/30/2018   Nonallopathic lesion of sacral region 09/30/2018   Nonallopathic lesion of lumbosacral region 09/30/2018   Nonallopathic lesion of thoracic region 09/30/2018   Peroneal tendinitis of left lower extremity 02/11/2018   Polyarthralgia 02/11/2018   Patellofemoral syndrome of right knee 02/11/2018   Gastroesophageal reflux disease 01/16/2017   Cough variant asthma 01/16/2017   Noncompliance with medication treatment due to intermittent use of medication 01/16/2017   Allergic  rhinoconjunctivitis 11/08/2015   Sinobronchitis 10/03/2015   OSA (obstructive sleep apnea) 12/14/2014   Crohn's disease of colon ([place]) 05/24/2014   Depression 02/01/2014   Disorder of intestine 12/17/2010   Anorectal polyp 12/17/2010   Glaucoma 01/14/2010   Dry skin dermatitis 01/15/2008   Crohn's disease ([place]) 01/14/2005    REFERRING PROVIDER: Dr. Nicholas Lose  REFERRING DIAG: Axillary swelling  THERAPY DIAG:  Malignant neoplasm of upper-inner quadrant of left breast in female, estrogen receptor positive ([place])  Aftercare following surgery for neoplasm  Abnormal posture  Localized edema  ONSET DATE: 02/18/2022 for edema increase  Rationale for Evaluation and Treatment: Rehabilitation  SUBJECTIVE                                                                                                                                                                                           SUBJECTIVE STATEMENT: I want you to look at my incision because I think it's opened up a little. And I shaved my head and I also think I have folliculitis so I'm going to call the doctor about that.   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 It measures 1.5 cm and is located in the upper inner quadrant with IDC only being present in lateral margin. It is ER/PR positive and HER2 negative with a Ki-67 of 5%. Thyroidectomy was also performed at the same time and 2 lymph nodes removed (nodes were negative) and all gross disease removed. Thyroid cancer will be treated with radioactive iodine.    PAIN:  Are you having pain? No, not currently  PRECAUTIONS: Other: Currently undergoing chemotherapy  WEIGHT BEARING RESTRICTIONS: No  FALLS:  Has patient fallen in last 6 months? No  LIVING ENVIRONMENT: Lives with: lives with their family Lives in: House/apartment Has following equipment at home: None  OCCUPATION:  Retired  LEISURE: She is walking an hour 5x/week  HAND DOMINANCE : right   PRIOR LEVEL OF FUNCTION: Independent  PATIENT GOALS: Reduce axillary edema for improved comfort   OBJECTIVE  COGNITION:  Overall cognitive status: Within functional limits for tasks assessed   PALPATION: Visible and  palpable pocket of fluid present just superior to her axillary incision. It appears to be an organized seroma with thick fluid present. She also has 2 visible and palpable axillary cords present (see photo) First photo 02/25/22       Second photo on 02/27/22    OBSERVATIONS / OTHER ASSESSMENTS: Positive upper limb tension testing likely due to axillary cording.  SENSATION: Light touch: Appears intact Stereognosis: Appears intact Hot/Cold: Appears intact Proprioception: Appears intact  POSTURE: Forward head, rounded shoulders   HAND DOMINANCE: Right  UPPER EXTREMITY AROM/PROM: All UE bilateral AROM is WNL   UPPER EXTREMITY STRENGTH: Functional  TODAY'S TREATMENT:                                                                                                                                         DATE:  03/18/22: Therapeutic Exercises Roll yellow ball up wall with 1# on Lt wrist into flexion and abd Standing with back against wall for bil UE 3 way raises with 2# x10 each returning therapist demo Modified downward dog on wall x5, 5 sec holds Manual Therapy P/ROM: In Supine during MFR to Lt shoulder into flexion, abd, and D2 into pts available end motions and with scapular depression by therapist throughout, pts end motions seemed slightly more limited today due to increased tenderness she has with new redness at incision.  MFR gently at Lt axilla where pt tight and tender  03/14/22: Therapeutic Exercises Pulleys into flexion and abd returning therapist demo x2 mins each and VC's during to decrease Lt scapular compensation Roll yellow ball up wall into flexion x 10 and abd x 10   Modified downward dog on wall 5x, 5 sec holds returning therapist demo Free[employer] machine for scapular retraction 3#, x10 returning therapist demo. VC's for core engaged during Supine Scapular Series with 1# x10 each, pt did very well returning correct technique Manual Therapy MFR: To Lt axilla where cording is very mildly visible or palpable but pt does report still feeling the pull from  P/ROM: In Supine during MFR to Lt shoulder into flexion, abd, and D2 into pts available end motions and with scapular depression by therapist throughout, her motion continued to be much improved after scap mobs and STM to Lt scapula STM with cocoa butter to Rt upper trap where pt palpably tight and trigger points palpated  03/12/22: Therapeutic Exercises Pulleys into flexion and abd returning therapist demo x2 mins each and VC's during to decrease Lt scapular compensation Roll yellow ball up wall into flexion x 10 and abd x 10 with 1# added to wrist Supine Scapular Series with yellow theraband x 10 each , except wide grip and D2 x5 each due to fatigue, returning therapist demo, handout issued for HEP Manual Therapy MFR: To Lt axilla where cording is very mildly visible or palpable but pt does report still feeling the pull from  P/ROM:  In Supine during MFR to Lt shoulder into flexion, abd, and D2 into pts available end motions and with scapular depression by therapist throughout, her motion continued to be much improved after scap mobs and STM to Lt scapula      PATIENT EDUCATION:  Education details: Supine scapular series Person educated: Patient Education method: Explanation, Demonstration, VC's and handout issued Education comprehension: verbalized understanding, pt returned demonstration and will benefit from review  HOME EXERCISE PROGRAM: None issued today  ASSESSMENT:  CLINICAL IMPRESSION: Pt comes in reporting new tenderness at incision and worried that it was opening up so assessed this. It  is increased in redness at the length of the incision and the medial aspect is bulbous in appearance with skin thickening as well as it feels inflammed. See picture under media tab. Dr. Brantley Stage and Iris Pert were sent an inbox message by Leone Payor, PT to update on symptom changes and see if pt should be on antibiotic or not. Was able to cont with normal treatment though as we are not doing manual lymph drainage. Her end Lt shoulder P/ROM was slightly limited though due to new tenderness. Pt was encouraged to cancel next session this week if tenderness increased  further affecting her end motions. She verbalized understanding and will reach out to [facility] if she doesn't hear back in a few hours.   OBJECTIVE IMPAIRMENTS: increased edema and increased fascial restrictions.   ACTIVITY LIMITATIONS:  Not functionally limited; edema and cording increase her risk for lymphedema  and needs to be addressed a resolved.  PARTICIPATION LIMITATIONS:  none  PERSONAL FACTORS: 1 comorbidity: Currently undergoing chemotherapy  is also affecting patient's functional outcome.   REHAB POTENTIAL: Excellent  CLINICAL DECISION MAKING: Stable/uncomplicated  EVALUATION COMPLEXITY: Low  GOALS: Goals reviewed with patient? Yes  LONG TERM GOALS: [place] date: 03/25/2022    Patient will demonstrate her swelling in her left axilla has resolved so that she can wear a normal bra.  Goal status: INITIAL  2.  Patient will demonstrate proper scar massage and manual lymph drainage techniques for improving edema.  Goal status: INITIAL  3.  Patient will demonstrate axillary cording has resolved so there is no tightness present at end ROM for left shoulder.   Goal status: INITIAL  PLAN:  PT FREQUENCY: 3x/week  PT DURATION: 4 weeks  PLANNED INTERVENTIONS: Therapeutic exercises, Therapeutic activity, Patient/Family education, Self Care, Manual lymph drainage, Compression bandaging, scar mobilization, Manual therapy,  and Re-evaluation  PLAN FOR NEXT SESSION: What did doctor say? How is incision? Cont AA/ROM stretches and cont to address cording with myofascial release; manual lymph drainage prn, cont scap mobs and STM to periscapular area.   Collie Siad, PTA 03/18/22 12:14 PM   Over Head Pull: Narrow and Wide Grip   Cancer Rehab [phone]   On back, knees bent, feet flat, band across thighs, elbows straight but relaxed. Pull hands apart (start). Keeping elbows straight, bring arms up and over head, hands toward floor. Keep pull steady on band. Hold momentarily. Return slowly, keeping pull steady, back to start. Then do same with a wider grip on the band (past shoulder width) Repeat _5-10__ times. Band color __yellow____   Side Pull: Double Arm   On back, knees bent, feet flat. Arms perpendicular to body, shoulder level, elbows straight but relaxed. Pull arms out to sides, elbows straight. Resistance band comes across collarbones, hands toward floor. Hold momentarily. Slowly return to starting position. Repeat _5-10__ times. Band color _yellow____   Judd Gaudier  On back, knees bent, feet flat, left hand on left hip, right hand above left. Pull right arm DIAGONALLY (hip to shoulder) across chest. Bring right arm along head toward floor. Hold momentarily. Slowly return to starting position. Repeat _5-10__ times. Do with left arm. Band color _yellow_____   Shoulder Rotation: Double Arm   On back, knees bent, feet flat, elbows tucked at sides, bent 90, hands palms up. Pull hands apart and down toward floor, keeping elbows near sides. Hold momentarily. Slowly return to starting position. Repeat _5-10__ times. Band color __yellow____

## 2022-03-19 ENCOUNTER — Other Ambulatory Visit: Payer: Self-pay | Admitting: Family Medicine

## 2022-03-19 ENCOUNTER — Other Ambulatory Visit (HOSPITAL_COMMUNITY): Payer: Self-pay

## 2022-03-19 ENCOUNTER — Other Ambulatory Visit: Payer: Self-pay

## 2022-03-19 ENCOUNTER — Other Ambulatory Visit: Payer: Self-pay | Admitting: Internal Medicine

## 2022-03-19 MED ORDER — VITAMIN D (ERGOCALCIFEROL) 1.25 MG (50000 UNIT) PO CAPS
50000.0000 [IU] | ORAL_CAPSULE | ORAL | 0 refills | Status: DC
  Filled 2022-03-19: qty 12, 84d supply, fill #0

## 2022-03-20 ENCOUNTER — Ambulatory Visit: Payer: 59

## 2022-03-20 ENCOUNTER — Other Ambulatory Visit (HOSPITAL_COMMUNITY): Payer: Self-pay

## 2022-03-20 DIAGNOSIS — R600 Localized edema: Secondary | ICD-10-CM

## 2022-03-20 DIAGNOSIS — Z170 Estrogen receptor positive status [ER+]: Secondary | ICD-10-CM | POA: Diagnosis not present

## 2022-03-20 DIAGNOSIS — Z483 Aftercare following surgery for neoplasm: Secondary | ICD-10-CM | POA: Diagnosis not present

## 2022-03-20 DIAGNOSIS — R293 Abnormal posture: Secondary | ICD-10-CM

## 2022-03-20 DIAGNOSIS — C50212 Malignant neoplasm of upper-inner quadrant of left female breast: Secondary | ICD-10-CM | POA: Diagnosis not present

## 2022-03-20 NOTE — Therapy (Signed)
OUTPATIENT PHYSICAL THERAPY UPPER EXTREMITY LYMPHEDEMA TREATMENT  Patient Name: [name] MRN: [id number] DOB:April 24, 1960, 62 y.o., female Today's Date: 03/20/2022  END OF SESSION:  PT End of Session - 03/20/22 0927     Visit Number 10    Number of Visits 15    Date for PT Re-Evaluation 03/25/22    PT Start Time 0923    PT Stop Time 1022    PT Time Calculation (min) 59 min    Activity Tolerance Patient tolerated treatment well    Behavior During Therapy [facility] for tasks assessed/performed             Past Medical History:  Diagnosis Date   Anxiety    Allergy induced asthma   Arthritis    Asthma    Breast cancer ([place])    Crohn disease ([place])    Depression    GERD (gastroesophageal reflux disease)    History of methicillin resistant staphylococcus aureus (MRSA)    pt denies this.   Multiple thyroid nodules    Pneumonia    Sleep apnea    CPAP   Thyroid cancer [facility])    Past Surgical History:  Procedure Laterality Date   ANAL FISSURE REPAIR  2004   BREAST LUMPECTOMY WITH RADIOACTIVE SEED LOCALIZATION Left 01/01/2022   Procedure: LEFT BREAST SEED LUMPECTOMY;  Surgeon: Erroll Luna, MD;  Location: [place];  Service: General;  Laterality: Left;   CESAREAN SECTION     COLONOSCOPY     INSERTION OF MESH N/A 07/26/2020   Procedure: INSERTION OF MESH;  Surgeon: Ralene Ok, MD;  Location: [place];  Service: General;  Laterality: N/A;   LIGAMENT REPAIR Right 11/23/2015   Procedure: right index radial collateral LIGAMENT REPAIR;  Surgeon: Leanora Cover, MD;  Location: [place];  Service: Orthopedics;  Laterality: Right;   PORTACATH PLACEMENT Right 02/07/2022   Procedure: INSERTION PORT-A-CATH;  Surgeon: Erroll Luna, MD;  Location: [place];  Service: General;  Laterality: Right;   RE-EXCISION OF BREAST LUMPECTOMY Left 01/22/2022   Procedure: RE-EXCISION OF LEFT BREAST LUMPECTOMY;  Surgeon: Erroll Luna, MD;  Location: [place];  Service:  General;  Laterality: Left;   SENTINEL NODE BIOPSY Left 01/22/2022   Procedure: LEFT SENTINEL NODE BIOPSY;  Surgeon: Erroll Luna, MD;  Location: [place];  Service: General;  Laterality: Left;   THYROIDECTOMY N/A 01/01/2022   Procedure: TOTAL THYROIDECTOMY WITH LIMITED LYMPH NODE DISSECTION;  Surgeon: Armandina Gemma, MD;  Location: [place];  Service: General;  Laterality: N/A;   UPPER ENDOSCOPY W/ ESOPHAGEAL MANOMETRY  10/2021   VENTRAL HERNIA REPAIR N/A 07/26/2020   Procedure: LAPAROSCOPIC VENTRAL HERNIA REPAIR WITH MESH;  Surgeon: Ralene Ok, MD;  Location: [place];  Service: General;  Laterality: N/A;   Patient Active Problem List   Diagnosis Date Noted   Malignant neoplasm of upper-inner quadrant of left breast in female, estrogen receptor positive ([place]) 01/09/2022   Postsurgical hypothyroidism 01/04/2022   Multiple thyroid nodules 12/20/2021   Papillary thyroid carcinoma ([place]) 12/20/2021   Nodule of lower lobe of right lung 10/17/2021   Left upper lobe pulmonary nodule 10/17/2021   Acromioclavicular sprain, left, initial encounter 07/27/2021   Tibialis posterior tendinitis, right 06/15/2020   Piriformis syndrome of left side [id number]   Umbilical hernia [id number]   Lumbar radiculopathy 07/09/2019   Contusion of right hip and thigh 04/20/2019   Loss of transverse plantar arch 02/11/2019   Sprain of iliolumbar ligament 10/29/2018   Piriformis syndrome  of right side 09/30/2018   Nonallopathic lesion of sacral region 09/30/2018   Nonallopathic lesion of lumbosacral region 09/30/2018   Nonallopathic lesion of thoracic region 09/30/2018   Peroneal tendinitis of left lower extremity 02/11/2018   Polyarthralgia 02/11/2018   Patellofemoral syndrome of right knee 02/11/2018   Gastroesophageal reflux disease 01/16/2017   Cough variant asthma 01/16/2017   Noncompliance with medication treatment due to intermittent use of medication 01/16/2017   Allergic  rhinoconjunctivitis 11/08/2015   Sinobronchitis 10/03/2015   OSA (obstructive sleep apnea) 12/14/2014   Crohn's disease of colon ([place]) 05/24/2014   Depression 02/01/2014   Disorder of intestine 12/17/2010   Anorectal polyp 12/17/2010   Glaucoma 01/14/2010   Dry skin dermatitis 01/15/2008   Crohn's disease ([place]) 01/14/2005    REFERRING PROVIDER: Dr. Nicholas Lose  REFERRING DIAG: Axillary swelling  THERAPY DIAG:  Malignant neoplasm of upper-inner quadrant of left breast in female, estrogen receptor positive ([place])  Aftercare following surgery for neoplasm  Abnormal posture  Localized edema  ONSET DATE: 02/18/2022 for edema increase  Rationale for Evaluation and Treatment: Rehabilitation  SUBJECTIVE                                                                                                                                                                                           SUBJECTIVE STATEMENT:  I'm sorry I'm running late! I didn't sleep good in the middle of the night so slept through my alarm this morning. But I am feeling good. My cording has been doing better. I saw Dr. Brantley Stage after our last session. He checked my incision and didn't think it was infected but put me on an antibiotic anyways to be safe. He thinks a stitch is working it's way out.   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 It measures 1.5 cm and is located in the upper inner quadrant with IDC only being present in lateral margin. It is ER/PR positive and HER2 negative with a Ki-67 of 5%. Thyroidectomy was also performed at the same time and 2 lymph nodes removed (nodes were negative) and all gross disease removed. Thyroid cancer will be treated with radioactive iodine.    PAIN:  Are you having pain? No, not currently  PRECAUTIONS: Other: Currently undergoing chemotherapy  WEIGHT BEARING RESTRICTIONS: No  FALLS:   Has patient fallen in last 6 months? No  LIVING ENVIRONMENT: Lives with: lives with their family Lives in: House/apartment Has following equipment at home: None  OCCUPATION: Retired  LEISURE: She is walking an hour 5x/week  HAND DOMINANCE : right  PRIOR LEVEL OF FUNCTION: Independent  PATIENT GOALS: Reduce axillary edema for improved comfort   OBJECTIVE  COGNITION:  Overall cognitive status: Within functional limits for tasks assessed   PALPATION: Visible and palpable pocket of fluid present just superior to her axillary incision. It appears to be an organized seroma with thick fluid present. She also has 2 visible and palpable axillary cords present (see photo) First photo 02/25/22       Second photo on 02/27/22    OBSERVATIONS / OTHER ASSESSMENTS: Positive upper limb tension testing likely due to axillary cording.  SENSATION: Light touch: Appears intact Stereognosis: Appears intact Hot/Cold: Appears intact Proprioception: Appears intact  POSTURE: Forward head, rounded shoulders   HAND DOMINANCE: Right  UPPER EXTREMITY AROM/PROM: All UE bilateral AROM is WNL   UPPER EXTREMITY STRENGTH: Functional  TODAY'S TREATMENT:                                                                                                                                         DATE:   03/20/22: Therapeutic Exercises Pulleys into flexion and abd x2 mins each with VC's to remind pt to decrease Lt scapular compensation.  Roll yellow ball up wall with 1# on Lt wrist into flexion and abd Standing with back against wall for bil UE 3 way raises with 2# x10 each returning therapist demo Modified downward dog on wall x5, 5 sec holds Manual Therapy P/ROM: In Supine during MFR to Lt shoulder into flexion, abd, and D2 into pts available end motions and with scapular depression by therapist throughout, pts end motions seemed some improved from last session.  MFR gently at Lt axilla and medial upper  arm where pt tight and tender  03/18/22: Therapeutic Exercises Roll yellow ball up wall with 1# on Lt wrist into flexion and abd Standing with back against wall for bil UE 3 way raises with 2# x10 each returning therapist demo Modified downward dog on wall x5, 5 sec holds Manual Therapy P/ROM: In Supine during MFR to Lt shoulder into flexion, abd, and D2 into pts available end motions and with scapular depression by therapist throughout, pts end motions seemed slightly more limited today due to increased tenderness she has with new redness at incision.  MFR gently at Lt axilla where pt tight and tender  03/14/22: Therapeutic Exercises Pulleys into flexion and abd returning therapist demo x2 mins each and VC's during to decrease Lt scapular compensation Roll yellow ball up wall into flexion x 10 and abd x 10  Modified downward dog on wall 5x, 5 sec holds returning therapist demo Free[employer] machine for scapular retraction 3#, x10 returning therapist demo. VC's for core engaged during Supine Scapular Series with 1# x10 each, pt did very well returning correct technique Manual Therapy MFR: To Lt axilla where cording is very mildly visible or palpable but pt does report still feeling the pull from  P/ROM: In Supine during MFR to Lt shoulder into flexion, abd, and D2 into pts available end motions and with scapular depression by therapist throughout, her motion continued to be much improved after scap mobs and STM to Lt scapula STM with cocoa butter to Rt upper trap where pt palpably tight and trigger points palpated      PATIENT EDUCATION:  Education details: Supine scapular series Person educated: Patient Education method: Explanation, Demonstration, VC's and handout issued Education comprehension: verbalized understanding, pt returned demonstration and will benefit from review  HOME EXERCISE PROGRAM: None issued today  ASSESSMENT:  CLINICAL IMPRESSION: Continued with AA/ROM and bil  UE strengthening exercises. Pt reports good stretches with these and feeling challenged by strengthening exs. Also continued with manual therapy working to improve her Lt shoulder end P/ROM which was some better today as compared to last session. Incision looks about the same as at last session and has not worsened. Pt does not report any increased pain here either.   OBJECTIVE IMPAIRMENTS: increased edema and increased fascial restrictions.   ACTIVITY LIMITATIONS:  Not functionally limited; edema and cording increase her risk for lymphedema  and needs to be addressed a resolved.  PARTICIPATION LIMITATIONS:  none  PERSONAL FACTORS: 1 comorbidity: Currently undergoing chemotherapy  is also affecting patient's functional outcome.   REHAB POTENTIAL: Excellent  CLINICAL DECISION MAKING: Stable/uncomplicated  EVALUATION COMPLEXITY: Low  GOALS: Goals reviewed with patient? Yes  LONG TERM GOALS: Target date: 03/25/2022    Patient will demonstrate her swelling in her left axilla has resolved so that she can wear a normal bra.  Goal status: INITIAL  2.  Patient will demonstrate proper scar massage and manual lymph drainage techniques for improving edema.  Goal status: INITIAL  3.  Patient will demonstrate axillary cording has resolved so there is no tightness present at end ROM for left shoulder.   Goal status: INITIAL  PLAN:  PT FREQUENCY: 3x/week  PT DURATION: 4 weeks  PLANNED INTERVENTIONS: Therapeutic exercises, Therapeutic activity, Patient/Family education, Self Care, Manual lymph drainage, Compression bandaging, scar mobilization, Manual therapy, and Re-evaluation  PLAN FOR NEXT SESSION: Cont AA/ROM stretches and cont to address cording with myofascial release; manual lymph drainage prn, cont scap mobs and STM to periscapular area.   Collie Siad, PTA 03/20/22 10:26 AM   Over Head Pull: Narrow and Wide Grip   Cancer Rehab [phone]   On back, knees bent, feet  flat, band across thighs, elbows straight but relaxed. Pull hands apart (start). Keeping elbows straight, bring arms up and over head, hands toward floor. Keep pull steady on band. Hold momentarily. Return slowly, keeping pull steady, back to start. Then do same with a wider grip on the band (past shoulder width) Repeat _5-10__ times. Band color __yellow____   Side Pull: Double Arm   On back, knees bent, feet flat. Arms perpendicular to body, shoulder level, elbows straight but relaxed. Pull arms out to sides, elbows straight. Resistance band comes across collarbones, hands toward floor. Hold momentarily. Slowly return to starting position. Repeat _5-10__ times. Band color _yellow____   Sword   On back, knees bent, feet flat, left hand on left hip, right hand above left. Pull right arm DIAGONALLY (hip to shoulder) across chest. Bring right arm along head toward floor. Hold momentarily. Slowly return to starting position. Repeat _5-10__ times. Do with left arm. Band color _yellow_____   Shoulder Rotation: Double Arm   On back, knees bent, feet flat, elbows tucked at sides, bent 90,  hands palms up. Pull hands apart and down toward floor, keeping elbows near sides. Hold momentarily. Slowly return to starting position. Repeat _5-10__ times. Band color __yellow____

## 2022-03-21 ENCOUNTER — Other Ambulatory Visit (HOSPITAL_COMMUNITY): Payer: Self-pay

## 2022-03-22 MED FILL — Fosaprepitant Dimeglumine For IV Infusion 150 MG (Base Eq): INTRAVENOUS | Qty: 5 | Status: AC

## 2022-03-22 MED FILL — Dexamethasone Sodium Phosphate Inj 100 MG/10ML: INTRAMUSCULAR | Qty: 1 | Status: AC

## 2022-03-22 NOTE — Progress Notes (Signed)
Patient Care Team: Elby Showers, MD as PCP - General (Internal Medicine) Mauro Kaufmann, RN as Oncology Nurse Navigator Rockwell Germany, RN as Oncology Nurse Navigator Nicholas Lose, MD as Consulting Physician (Hematology and Oncology) Nicholas Lose, MD as Consulting Physician (Hematology and Oncology)  DIAGNOSIS: No diagnosis found.  SUMMARY OF ONCOLOGIC HISTORY: Oncology History  Malignant neoplasm of upper-inner quadrant of left breast in female, estrogen receptor positive ([place])  01/01/2022 Surgery   Initial biopsy left breast: ALH Left lumpectomy: 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, lateral margin positive ER 90%, PR 100%, Ki-67 5%, HER2 equivocal by IHC 2+, FISH negative ratio 0.93 (Total thyroidectomy: Papillary carcinoma right lobe, classic and follicular subtype 2.3 cm) 0/2 lymph nodes)   01/16/2022 Cancer Staging   Staging form: Breast, AJCC 8th Edition - Clinical: Stage IA (cT1c, cN0, cM0, G2, ER+, PR+, HER2-) - Signed by Nicholas Lose, MD on 01/16/2022 Histologic grading system: 3 grade system   01/22/2022 Surgery   Margin reexcision surgery: Benign margins, sentinel lymph node 0/1   01/23/2022 Oncotype testing   Recurrence score: 31 (19% distant recurrence risk at 9 years)   02/11/2022 -  Chemotherapy   Patient is on Treatment Plan : BREAST TC q21d       CHIEF COMPLIANT:   INTERVAL HISTORY: Lucia Estelle is a   ALLERGIES:  is allergic to chlorhexidine gluconate, gabapentin, hydrocodone, oxycodone hcl, and betadine [povidone iodine].  MEDICATIONS:  Current Outpatient Medications  Medication Sig Dispense Refill   Adalimumab (HUMIRA, 2 SYRINGE,) 40 MG/0.4ML PSKT Inject 0.4 mLs (40 mg total) under the skin every 14 (fourteen) days. (Patient not taking: Reported on 03/04/2022) 2 each 5   albuterol (VENTOLIN HFA) 108 (90 Base) MCG/ACT inhaler Inhale 2 puffs into the lungs every 6 (six) hours as needed for wheezing or shortness of breath. (Patient not  taking: Reported on 03/04/2022) 8.5 g 3   ascorbic acid (VITAMIN C) 500 MG tablet Take 500 mg by mouth 2 (two) times a week.     bimatoprost (LUMIGAN) 0.01 % SOLN Instill 1 drop into both eyes at bedtime 2.5 mL 10   busPIRone (BUSPAR) 7.5 MG tablet Take 1 tablet (7.5 mg total) by mouth 2 (two) times daily. 180 tablet 2   celecoxib (CELEBREX) 100 MG capsule Take 1 capsule (100 mg total) by mouth in the morning and at bedtime. (Patient not taking: Reported on 02/04/2022) 60 capsule 11   cetirizine (ZYRTEC) 10 MG tablet Take 10 mg by mouth in the morning. (Patient not taking: Reported on 03/04/2022)     chlorhexidine (PERIDEX) 0.12 % solution After brushing, rinse with one capful for one minute twice a day then spit out 473 mL 0   dexamethasone (DECADRON) 4 MG tablet Take 1 tablet day before chemo and 1 tablet day after chemo with food. (Patient taking differently: Take 4 mg by mouth See admin instructions. Take 4 mg one day before Chemo and 4 mg the day after chemo) 10 tablet 0   doxycycline (MONODOX) 100 MG capsule Take 1 capsule (100 mg total) by mouth 2 (two) times daily for 7 days 14 capsule 0   DULoxetine (CYMBALTA) 60 MG capsule Take 1 capsule (60 mg total) by mouth daily. 90 capsule 1   fluocinonide ointment (LIDEX) [id number] % Apply 1 application  topically daily as needed (irritation). (Patient not taking: Reported on 03/04/2022)     levothyroxine (SYNTHROID) 50 MCG tablet Take 2 tablets (100 mcg total) by  mouth daily before breakfast. 60 tablet 3   lidocaine-prilocaine (EMLA) cream Apply to affected area once 30 g 3   omeprazole (PRILOSEC) 20 MG capsule Take 1 capsule (20 mg total) by mouth daily. 90 capsule 3   ondansetron (ZOFRAN) 8 MG tablet Take 1 tablet (8 mg total) by mouth every 8 (eight) hours as needed for nausea or vomiting. Start on the third day after chemotherapy. 30 tablet 1   prochlorperazine (COMPAZINE) 10 MG tablet Take 1 tablet (10 mg total) by mouth every 6 (six) hours as needed for  nausea or vomiting. (Patient not taking: Reported on 03/04/2022) 30 tablet 1   Vitamin D, Ergocalciferol, (DRISDOL) 1.25 MG (50000 UNIT) CAPS capsule Take 1 capsule (50,000 Units total) by mouth every 7 (seven) days. 12 capsule 0   Current Facility-Administered Medications  Medication Dose Route Frequency Provider Last Rate Last Admin   0.9 %  sodium chloride infusion  500 mL Intravenous Continuous Armbruster, Carlota Raspberry, MD        PHYSICAL EXAMINATION: ECOG PERFORMANCE STATUS: {CHL ONC ECOG [id number]  There were no vitals filed for this visit. There were no vitals filed for this visit.  BREAST:*** No palpable masses or nodules in either right or left breasts. No palpable axillary supraclavicular or infraclavicular adenopathy no breast tenderness or nipple discharge. (exam performed in the presence of a chaperone)  LABORATORY DATA:  I have reviewed the data as listed    Latest Ref Rng & Units 03/04/2022    9:25 AM 02/18/2022    9:42 AM 02/11/2022    9:14 AM  CMP  Glucose 70 - 99 mg/dL 104  79  77   BUN 8 - 23 mg/dL '13  12  16   '$ Creatinine 0.44 - 1.00 mg/dL 0.54  0.68  0.64   Sodium 135 - 145 mmol/L 138  136  138   Potassium 3.5 - 5.1 mmol/L 3.9  3.7  3.4   Chloride 98 - 111 mmol/L 104  104  105   CO2 22 - 32 mmol/L '27  28  27   '$ Calcium 8.9 - 10.3 mg/dL 9.3  9.1  9.6   Total Protein 6.5 - 8.1 g/dL 7.3  6.3  7.0   Total Bilirubin 0.3 - 1.2 mg/dL 0.3  0.4  0.3   Alkaline Phos 38 - 126 U/L 74  52  43   AST 15 - 41 U/L '29  17  18   '$ ALT 0 - 44 U/L 37  16  16     Lab Results  Component Value Date   WBC 11.8 (H) 03/04/2022   HGB 11.4 (L) 03/04/2022   HCT 33.6 (L) 03/04/2022   MCV 92.1 03/04/2022   PLT 457 (H) 03/04/2022   NEUTROABS 9.2 (H) 03/04/2022    ASSESSMENT & PLAN:  No problem-specific Assessment & Plan notes found for this encounter.    No orders of the defined types were placed in this encounter.  The patient has a good understanding of the overall plan. she  agrees with it. she will call with any problems that may develop before the next visit here. Total time spent: 30 mins including face to face time and time spent for planning, charting and co-ordination of care   Suzzette Righter, [place] 03/22/22    I Gardiner Coins am acting as a Education administrator for [employer]  ***

## 2022-03-25 ENCOUNTER — Inpatient Hospital Stay: Payer: 59 | Attending: Hematology and Oncology

## 2022-03-25 ENCOUNTER — Inpatient Hospital Stay (HOSPITAL_BASED_OUTPATIENT_CLINIC_OR_DEPARTMENT_OTHER): Payer: 59 | Admitting: Hematology and Oncology

## 2022-03-25 ENCOUNTER — Inpatient Hospital Stay: Payer: 59

## 2022-03-25 VITALS — BP 125/62 | HR 68 | Temp 97.5°F | Resp 18 | Ht 62.0 in | Wt 152.2 lb

## 2022-03-25 DIAGNOSIS — D6481 Anemia due to antineoplastic chemotherapy: Secondary | ICD-10-CM | POA: Insufficient documentation

## 2022-03-25 DIAGNOSIS — Z5111 Encounter for antineoplastic chemotherapy: Secondary | ICD-10-CM | POA: Insufficient documentation

## 2022-03-25 DIAGNOSIS — K5090 Crohn's disease, unspecified, without complications: Secondary | ICD-10-CM | POA: Diagnosis not present

## 2022-03-25 DIAGNOSIS — Z95828 Presence of other vascular implants and grafts: Secondary | ICD-10-CM

## 2022-03-25 DIAGNOSIS — C50212 Malignant neoplasm of upper-inner quadrant of left female breast: Secondary | ICD-10-CM | POA: Diagnosis not present

## 2022-03-25 DIAGNOSIS — R630 Anorexia: Secondary | ICD-10-CM | POA: Insufficient documentation

## 2022-03-25 DIAGNOSIS — Z170 Estrogen receptor positive status [ER+]: Secondary | ICD-10-CM | POA: Insufficient documentation

## 2022-03-25 DIAGNOSIS — R5383 Other fatigue: Secondary | ICD-10-CM | POA: Insufficient documentation

## 2022-03-25 DIAGNOSIS — Z5189 Encounter for other specified aftercare: Secondary | ICD-10-CM | POA: Diagnosis not present

## 2022-03-25 HISTORY — DX: Presence of other vascular implants and grafts: Z95.828

## 2022-03-25 LAB — CBC WITH DIFFERENTIAL (CANCER CENTER ONLY)
Abs Immature Granulocytes: 0.03 10*3/uL (ref 0.00–0.07)
Basophils Absolute: 0.1 10*3/uL (ref 0.0–0.1)
Basophils Relative: 1 %
Eosinophils Absolute: 0 10*3/uL (ref 0.0–0.5)
Eosinophils Relative: 0 %
HCT: 30 % — ABNORMAL LOW (ref 36.0–46.0)
Hemoglobin: 10.3 g/dL — ABNORMAL LOW (ref 12.0–15.0)
Immature Granulocytes: 0 %
Lymphocytes Relative: 36 %
Lymphs Abs: 2.6 10*3/uL (ref 0.7–4.0)
MCH: 31.6 pg (ref 26.0–34.0)
MCHC: 34.3 g/dL (ref 30.0–36.0)
MCV: 92 fL (ref 80.0–100.0)
Monocytes Absolute: 1.1 10*3/uL — ABNORMAL HIGH (ref 0.1–1.0)
Monocytes Relative: 15 %
Neutro Abs: 3.5 10*3/uL (ref 1.7–7.7)
Neutrophils Relative %: 48 %
Platelet Count: 319 10*3/uL (ref 150–400)
RBC: 3.26 MIL/uL — ABNORMAL LOW (ref 3.87–5.11)
RDW: 15.4 % (ref 11.5–15.5)
WBC Count: 7.2 10*3/uL (ref 4.0–10.5)
nRBC: 0 % (ref 0.0–0.2)

## 2022-03-25 LAB — CMP (CANCER CENTER ONLY)
ALT: 22 U/L (ref 0–44)
AST: 18 U/L (ref 15–41)
Albumin: 4.1 g/dL (ref 3.5–5.0)
Alkaline Phosphatase: 55 U/L (ref 38–126)
Anion gap: 7 (ref 5–15)
BUN: 17 mg/dL (ref 8–23)
CO2: 26 mmol/L (ref 22–32)
Calcium: 9.5 mg/dL (ref 8.9–10.3)
Chloride: 105 mmol/L (ref 98–111)
Creatinine: 0.64 mg/dL (ref 0.44–1.00)
GFR, Estimated: >60 mL/min (ref >60)
Glucose, Bld: 90 mg/dL (ref 70–99)
Potassium: 3.7 mmol/L (ref 3.5–5.1)
Sodium: 138 mmol/L (ref 135–145)
Total Bilirubin: 0.5 mg/dL (ref 0.3–1.2)
Total Protein: 6.9 g/dL (ref 6.5–8.1)

## 2022-03-25 MED ORDER — SODIUM CHLORIDE 0.9 % IV SOLN
10.0000 mg | Freq: Once | INTRAVENOUS | Status: AC
  Administered 2022-03-25: 10 mg via INTRAVENOUS
  Filled 2022-03-25: qty 10

## 2022-03-25 MED ORDER — HEPARIN SOD (PORK) LOCK FLUSH 100 UNIT/ML IV SOLN
500.0000 [IU] | Freq: Once | INTRAVENOUS | Status: AC | PRN
  Administered 2022-03-25: 500 [IU]

## 2022-03-25 MED ORDER — SODIUM CHLORIDE 0.9 % IV SOLN
150.0000 mg | Freq: Once | INTRAVENOUS | Status: AC
  Administered 2022-03-25: 150 mg via INTRAVENOUS
  Filled 2022-03-25: qty 150

## 2022-03-25 MED ORDER — SODIUM CHLORIDE 0.9 % IV SOLN
75.0000 mg/m2 | Freq: Once | INTRAVENOUS | Status: AC
  Administered 2022-03-25: 129 mg via INTRAVENOUS
  Filled 2022-03-25: qty 12.9

## 2022-03-25 MED ORDER — SODIUM CHLORIDE 0.9 % IV SOLN: Freq: Once | INTRAVENOUS | Status: AC

## 2022-03-25 MED ORDER — SODIUM CHLORIDE 0.9% FLUSH
10.0000 mL | Freq: Once | INTRAVENOUS | Status: AC
  Administered 2022-03-25: 10 mL

## 2022-03-25 MED ORDER — PALONOSETRON HCL INJECTION 0.25 MG/5ML
0.2500 mg | Freq: Once | INTRAVENOUS | Status: AC
  Administered 2022-03-25: 0.25 mg via INTRAVENOUS
  Filled 2022-03-25: qty 5

## 2022-03-25 MED ORDER — SODIUM CHLORIDE 0.9 % IV SOLN
600.0000 mg/m2 | Freq: Once | INTRAVENOUS | Status: AC
  Administered 2022-03-25: 1000 mg via INTRAVENOUS
  Filled 2022-03-25: qty 50

## 2022-03-25 MED ORDER — SODIUM CHLORIDE 0.9% FLUSH
10.0000 mL | INTRAVENOUS | Status: DC | PRN
  Administered 2022-03-25: 10 mL

## 2022-03-25 NOTE — Assessment & Plan Note (Addendum)
ASSESSMENT & PLAN:  Malignant neoplasm of upper-inner quadrant of left breast in female, estrogen receptor positive ([place]) 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) 0/2 lymph nodes) 01/22/2022: Margin reexcision: Benign, 0/1 sentinel lymph node negative 01/23/2022: Oncotype DX recurrence score 31 (19% risk of distant recurrence at 9 years)   Treatment plan: Adjuvant chemotherapy with Taxotere and Cytoxan every 3 weeks x 4 Adjuvant radiation therapy Adjuvant antiestrogen therapy ----------------------------------------------------------------------- Current treatment: Cycle 3 Taxotere and Cytoxan Chemo toxicities: Nausea: Mild Intermittent diarrhea: Patient tells me that she has chronic mild diarrhea from Crohn's disease.  Instructed her to take probiotics she gets constipation alternating with diarrhea. Chemo-induced anemia: Monitoring closely today's hemoglobin is 10.3 Lack of taste and appetite with a metallic taste when she eats certain foods   Labs reviewed and they seem to show that she has recovered her blood counts.  Therefore we will keep the dosage of her treatment the same. Return to clinic in 3 weeks for cycle 4

## 2022-03-25 NOTE — Patient Instructions (Signed)
[facility]  Discharge Instructions: Thank you for choosing [facility] to provide your oncology and hematology care.   If you have a lab appointment with the Cancer Center, please go directly to the Cancer Center and check in at the registration area.   Wear comfortable clothing and clothing appropriate for easy access to any Portacath or PICC line.   We strive to give you quality time with your provider. You may need to reschedule your appointment if you arrive late (15 or more minutes).  Arriving late affects you and other patients whose appointments are after yours.  Also, if you miss three or more appointments without notifying the office, you may be dismissed from the clinic at the provider's discretion.      For prescription refill requests, have your pharmacy contact our office and allow 72 hours for refills to be completed.    Today you received the following chemotherapy and/or immunotherapy agents; Docetaxel & Cytoxan      To help prevent nausea and vomiting after your treatment, we encourage you to take your nausea medication as directed.  BELOW ARE SYMPTOMS THAT SHOULD BE REPORTED IMMEDIATELY: *FEVER GREATER THAN 100.4 F (38 C) OR HIGHER *CHILLS OR SWEATING *NAUSEA AND VOMITING THAT IS NOT CONTROLLED WITH YOUR NAUSEA MEDICATION *UNUSUAL SHORTNESS OF BREATH *UNUSUAL BRUISING OR BLEEDING *URINARY PROBLEMS (pain or burning when urinating, or frequent urination) *BOWEL PROBLEMS (unusual diarrhea, constipation, pain near the anus) TENDERNESS IN MOUTH AND THROAT WITH OR WITHOUT PRESENCE OF ULCERS (sore throat, sores in mouth, or a toothache) UNUSUAL RASH, SWELLING OR PAIN  UNUSUAL VAGINAL DISCHARGE OR ITCHING   Items with * indicate a potential emergency and should be followed up as soon as possible or go to the Emergency Department if any problems should occur.  Please show the CHEMOTHERAPY ALERT CARD or IMMUNOTHERAPY ALERT  CARD at check-in to the Emergency Department and triage nurse.  Should you have questions after your visit or need to cancel or reschedule your appointment, please contact [facility]  Dept: [phone]  and follow the prompts.  Office hours are 8:00 a.m. to 4:30 p.m. Monday - Friday. Please note that voicemails left after 4:00 p.m. may not be returned until the following business day.  We are closed weekends and major holidays. You have access to a nurse at all times for urgent questions. Please call the main number to the clinic Dept: [phone] and follow the prompts.   For any non-urgent questions, you may also contact your provider using MyChart. We now offer e-Visits for anyone 18 and older to request care online for non-urgent symptoms. For details visit [email or web address].   Also download the MyChart app! Go to the app store, search "MyChart", open the app, select [facility], and log in with your MyChart username and password.   

## 2022-03-26 ENCOUNTER — Ambulatory Visit: Payer: 59

## 2022-03-26 DIAGNOSIS — Z483 Aftercare following surgery for neoplasm: Secondary | ICD-10-CM

## 2022-03-26 DIAGNOSIS — Z170 Estrogen receptor positive status [ER+]: Secondary | ICD-10-CM | POA: Diagnosis not present

## 2022-03-26 DIAGNOSIS — R600 Localized edema: Secondary | ICD-10-CM

## 2022-03-26 DIAGNOSIS — C50212 Malignant neoplasm of upper-inner quadrant of left female breast: Secondary | ICD-10-CM | POA: Diagnosis not present

## 2022-03-26 DIAGNOSIS — R293 Abnormal posture: Secondary | ICD-10-CM

## 2022-03-26 NOTE — Therapy (Signed)
OUTPATIENT PHYSICAL THERAPY UPPER EXTREMITY LYMPHEDEMA TREATMENT  Patient Name: [name] MRN: [id number] DOB:April 19, 1960, 62 y.o., female Today's Date: 03/26/2022  END OF SESSION:  PT End of Session - 03/26/22 0858     Visit Number 11    Number of Visits 25    Date for PT Re-Evaluation 05/21/22    PT Start Time 0900    PT Stop Time 0953    PT Time Calculation (min) 53 min    Activity Tolerance Patient tolerated treatment well    Behavior During Therapy [facility] for tasks assessed/performed             Past Medical History:  Diagnosis Date   Anxiety    Allergy induced asthma   Arthritis    Asthma    Breast cancer ([place])    Crohn disease ([place])    Depression    GERD (gastroesophageal reflux disease)    History of methicillin resistant staphylococcus aureus (MRSA)    pt denies this.   Multiple thyroid nodules    Pneumonia    Sleep apnea    CPAP   Thyroid cancer [facility])    Past Surgical History:  Procedure Laterality Date   ANAL FISSURE REPAIR  2004   BREAST LUMPECTOMY WITH RADIOACTIVE SEED LOCALIZATION Left 01/01/2022   Procedure: LEFT BREAST SEED LUMPECTOMY;  Surgeon: Erroll Luna, MD;  Location: [place];  Service: General;  Laterality: Left;   CESAREAN SECTION     COLONOSCOPY     INSERTION OF MESH N/A 07/26/2020   Procedure: INSERTION OF MESH;  Surgeon: Ralene Ok, MD;  Location: [place];  Service: General;  Laterality: N/A;   LIGAMENT REPAIR Right 11/23/2015   Procedure: right index radial collateral LIGAMENT REPAIR;  Surgeon: Leanora Cover, MD;  Location: [place];  Service: Orthopedics;  Laterality: Right;   PORTACATH PLACEMENT Right 02/07/2022   Procedure: INSERTION PORT-A-CATH;  Surgeon: Erroll Luna, MD;  Location: [place];  Service: General;  Laterality: Right;   RE-EXCISION OF BREAST LUMPECTOMY Left 01/22/2022   Procedure: RE-EXCISION OF LEFT BREAST LUMPECTOMY;  Surgeon: Erroll Luna, MD;  Location: [place];  Service:  General;  Laterality: Left;   SENTINEL NODE BIOPSY Left 01/22/2022   Procedure: LEFT SENTINEL NODE BIOPSY;  Surgeon: Erroll Luna, MD;  Location: [place];  Service: General;  Laterality: Left;   THYROIDECTOMY N/A 01/01/2022   Procedure: TOTAL THYROIDECTOMY WITH LIMITED LYMPH NODE DISSECTION;  Surgeon: Armandina Gemma, MD;  Location: [place];  Service: General;  Laterality: N/A;   UPPER ENDOSCOPY W/ ESOPHAGEAL MANOMETRY  10/2021   VENTRAL HERNIA REPAIR N/A 07/26/2020   Procedure: LAPAROSCOPIC VENTRAL HERNIA REPAIR WITH MESH;  Surgeon: Ralene Ok, MD;  Location: [place];  Service: General;  Laterality: N/A;   Patient Active Problem List   Diagnosis Date Noted   Port-A-Cath in place 03/25/2022   Malignant neoplasm of upper-inner quadrant of left breast in female, estrogen receptor positive ([place]) 01/09/2022   Postsurgical hypothyroidism 01/04/2022   Multiple thyroid nodules 12/20/2021   Papillary thyroid carcinoma ([place]) 12/20/2021   Nodule of lower lobe of right lung 10/17/2021   Left upper lobe pulmonary nodule 10/17/2021   Acromioclavicular sprain, left, initial encounter 07/27/2021   Tibialis posterior tendinitis, right 06/15/2020   Piriformis syndrome of left side [id number]   Umbilical hernia [id number]   Lumbar radiculopathy 07/09/2019   Contusion of right hip and thigh 04/20/2019   Loss of transverse plantar arch 02/11/2019   Sprain of iliolumbar  ligament 10/29/2018   Piriformis syndrome of right side 09/30/2018   Nonallopathic lesion of sacral region 09/30/2018   Nonallopathic lesion of lumbosacral region 09/30/2018   Nonallopathic lesion of thoracic region 09/30/2018   Peroneal tendinitis of left lower extremity 02/11/2018   Polyarthralgia 02/11/2018   Patellofemoral syndrome of right knee 02/11/2018   Gastroesophageal reflux disease 01/16/2017   Cough variant asthma 01/16/2017   Noncompliance with medication treatment due to intermittent use of medication  01/16/2017   Allergic rhinoconjunctivitis 11/08/2015   Sinobronchitis 10/03/2015   OSA (obstructive sleep apnea) 12/14/2014   Crohn's disease of colon ([place]) 05/24/2014   Depression 02/01/2014   Disorder of intestine 12/17/2010   Anorectal polyp 12/17/2010   Glaucoma 01/14/2010   Dry skin dermatitis 01/15/2008   Crohn's disease ([place]) 01/14/2005    REFERRING PROVIDER: Dr. Nicholas Lose  REFERRING DIAG: Axillary swelling  THERAPY DIAG:  Malignant neoplasm of upper-inner quadrant of left breast in female, estrogen receptor positive ([place])  Aftercare following surgery for neoplasm  Abnormal posture  Localized edema  ONSET DATE: 02/18/2022 for edema increase  Rationale for Evaluation and Treatment: Rehabilitation  SUBJECTIVE                                                                                                                                                                                           SUBJECTIVE STATEMENT:  The cording is not good.  The skin is very tender on the swollen part under my arm. The bra irritates the area. The foam helps but it moves. I don't feel like the cording limits me much. I took the antibiotic until yesterday. It looks a little better today, but discomfort is about the same. Pt will start radiation at the end of August.    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 It measures 1.5 cm and is located in the upper inner quadrant with IDC only being present in lateral margin. It is ER/PR positive and HER2 negative with a Ki-67 of 5%. Thyroidectomy was also performed at the same time and 2 lymph nodes removed (nodes were negative) and all gross disease removed. Thyroid cancer will be treated with radioactive iodine.    PAIN:  Are you having pain? No, not currently, but can reach a 5/10 by the end of the day.  PRECAUTIONS: Other: Currently undergoing  chemotherapy  WEIGHT BEARING RESTRICTIONS: No  FALLS:  Has patient fallen in last 6 months? No  LIVING ENVIRONMENT: Lives with: lives with their family Lives in: House/apartment Has following equipment at home: None  OCCUPATION: Retired  LEISURE:  She is walking an hour 5x/week, wants to resume tennis and pickle ball  HAND DOMINANCE : right   PRIOR LEVEL OF FUNCTION: Independent  PATIENT GOALS: Reduce axillary edema for improved comfort   OBJECTIVE  COGNITION:  Overall cognitive status: Within functional limits for tasks assessed   PALPATION: Visible and palpable pocket of fluid present just superior to her axillary incision. It appears to be an organized seroma with thick fluid present. She also has 2 visible and palpable axillary cords present (see photo) First photo 02/25/22       Second photo on 02/27/22    OBSERVATIONS / OTHER ASSESSMENTS: Positive upper limb tension testing likely due to axillary cording.  A/PROM RIGHT   eval    Shoulder extension 40  Shoulder flexion 141  Shoulder abduction 152  Shoulder internal rotation 46  Shoulder external rotation 87                          (Blank rows = not tested)   A/PROM LEFT   eval LEFT 02/04/2022 LEFT 03/26/2022  Shoulder extension 40 39 50 Pulls  Shoulder flexion 134 140 157  Shoulder abduction 157 164 168  Shoulder internal rotation 45 63 65  Shoulder external rotation 87 82 108                          (Blank rows = not tested)    SENSATION: Light touch: Appears intact Stereognosis: Appears intact Hot/Cold: Appears intact Proprioception: Appears intact  POSTURE: Forward head, rounded shoulders   HAND DOMINANCE: Right  UPPER EXTREMITY AROM/PROM: All UE bilateral AROM is WNL   UPPER EXTREMITY STRENGTH: Functional  TODAY'S TREATMENT:                                                                                                                                         DATE:   03/26/2022 Assessed  incision and placed photos in media. Messaged MD to see if he feels she needs to return sooner Supine wand x 3 flexion and scaption MFR to cording area in left UE avoiding incision area PROM left shoulder flexion, scaption, abduction, ER LTR x 5 knees to the right with arms outstretched STM with cocoa butter to left UT and in SL to left scapular area Assessed ROM  03/20/22: Therapeutic Exercises Pulleys into flexion and abd x2 mins each with VC's to remind pt to decrease Lt scapular compensation.  Roll yellow ball up wall with 1# on Lt wrist into flexion and abd Standing with back against wall for bil UE 3 way raises with 2# x10 each returning therapist demo Modified downward dog on wall x5, 5 sec holds Manual Therapy P/ROM: In Supine during MFR to Lt shoulder into flexion, abd, and D2 into pts available end motions and with scapular depression by therapist throughout,  pts end motions seemed some improved from last session.  MFR gently at Lt axilla and medial upper arm where pt tight and tender  03/18/22: Therapeutic Exercises Roll yellow ball up wall with 1# on Lt wrist into flexion and abd Standing with back against wall for bil UE 3 way raises with 2# x10 each returning therapist demo Modified downward dog on wall x5, 5 sec holds Manual Therapy P/ROM: In Supine during MFR to Lt shoulder into flexion, abd, and D2 into pts available end motions and with scapular depression by therapist throughout, pts end motions seemed slightly more limited today due to increased tenderness she has with new redness at incision.  MFR gently at Lt axilla where pt tight and tender  03/14/22: Therapeutic Exercises Pulleys into flexion and abd returning therapist demo x2 mins each and VC's during to decrease Lt scapular compensation Roll yellow ball up wall into flexion x 10 and abd x 10  Modified downward dog on wall 5x, 5 sec holds returning therapist demo [employer] machine for scapular retraction 3#, x10  returning therapist demo. VC's for core engaged during Supine Scapular Series with 1# x10 each, pt did very well returning correct technique Manual Therapy MFR: To Lt axilla where cording is very mildly visible or palpable but pt does report still feeling the pull from  P/ROM: In Supine during MFR to Lt shoulder into flexion, abd, and D2 into pts available end motions and with scapular depression by therapist throughout, her motion continued to be much improved after scap mobs and STM to Lt scapula STM with cocoa butter to Rt upper trap where pt palpably tight and trigger points palpated      PATIENT EDUCATION:  Education details: Supine scapular series Person educated: Patient Education method: Explanation, Demonstration, VC's and handout issued Education comprehension: verbalized understanding, pt returned demonstration and will benefit from review  HOME EXERCISE PROGRAM: None issued today  ASSESSMENT:  CLINICAL IMPRESSION: Pt completed antibiotics yesterday. She is making excellent progress with ROM, with mild end range tightness from cording. Cording is not nearly as visible or noticeable as it was. The incision area is more elevated and photos were placed in media. MD was also messaged to see if he wants to see her sooner. She has not been doing MLD or scar massage recently because of concerns there may be infection. There was significant tightness/tenderness in left UT and scapular area which felt much better after manual work today. Pt will benefit from continued skilled therapy as needed to address deficits and return pt to her PLOF.  OBJECTIVE IMPAIRMENTS: increased edema and increased fascial restrictions.   ACTIVITY LIMITATIONS:  Not functionally limited; edema and cording increase her risk for lymphedema  and needs to be addressed a resolved.  PARTICIPATION LIMITATIONS:  none  PERSONAL FACTORS: 1 comorbidity: Currently undergoing chemotherapy  is also affecting patient's  functional outcome.   REHAB POTENTIAL: Excellent  CLINICAL DECISION MAKING: Stable/uncomplicated  EVALUATION COMPLEXITY: Low  GOALS: Goals reviewed with patient? Yes  LONG TERM GOALS: [place] date: 03/25/2022    Patient will demonstrate her swelling in her left axilla has resolved so that she can wear a normal bra.  Goal status:in Progress  2.  Patient will demonstrate proper scar massage and manual lymph drainage techniques for improving edema.  Goal status:In progress  3.  Patient will demonstrate axillary cording has resolved so there is no tightness present at end ROM for left shoulder.  Goal status:In Progress  4.Pt will be able  to resume playing  pickleball without complaint  Goal status;New    PLAN:  PT FREQUENCY:  schedule 1-2X/week  and put on hold prn as pt completes chemotherapy and continues throughout the radiation process  PT DURATION: 8 weeks  PLANNED INTERVENTIONS: Therapeutic exercises, Therapeutic activity, Patient/Family education, Self Care, Manual lymph drainage, Compression bandaging, scar mobilization, Manual therapy, and Re-evaluation  PLAN FOR NEXT SESSION: check incision area, Did MD want to see her before next week?,May decrease visits as pt feels able.Cont AA/ROM stretches and cont to address cording with myofascial release; manual lymph drainage prn, cont scap mobs and STM to periscapular area.    Cheral Almas, PT 03/26/22 11:04 AM   Over Head Pull: Narrow and Wide Grip   Cancer Rehab [phone]   On back, knees bent, feet flat, band across thighs, elbows straight but relaxed. Pull hands apart (start). Keeping elbows straight, bring arms up and over head, hands toward floor. Keep pull steady on band. Hold momentarily. Return slowly, keeping pull steady, back to start. Then do same with a wider grip on the band (past shoulder width) Repeat _5-10__ times. Band color __yellow____   Side Pull: Double Arm   On back, knees bent, feet flat. Arms  perpendicular to body, shoulder level, elbows straight but relaxed. Pull arms out to sides, elbows straight. Resistance band comes across collarbones, hands toward floor. Hold momentarily. Slowly return to starting position. Repeat _5-10__ times. Band color _yellow____   Sword   On back, knees bent, feet flat, left hand on left hip, right hand above left. Pull right arm DIAGONALLY (hip to shoulder) across chest. Bring right arm along head toward floor. Hold momentarily. Slowly return to starting position. Repeat _5-10__ times. Do with left arm. Band color _yellow_____   Shoulder Rotation: Double Arm   On back, knees bent, feet flat, elbows tucked at sides, bent 90, hands palms up. Pull hands apart and down toward floor, keeping elbows near sides. Hold momentarily. Slowly return to starting position. Repeat _5-10__ times. Band color __yellow____

## 2022-03-27 ENCOUNTER — Inpatient Hospital Stay: Payer: 59

## 2022-03-27 VITALS — BP 131/70 | HR 73 | Temp 98.7°F | Resp 18

## 2022-03-27 DIAGNOSIS — K5090 Crohn's disease, unspecified, without complications: Secondary | ICD-10-CM | POA: Diagnosis not present

## 2022-03-27 DIAGNOSIS — C50212 Malignant neoplasm of upper-inner quadrant of left female breast: Secondary | ICD-10-CM

## 2022-03-27 DIAGNOSIS — Z170 Estrogen receptor positive status [ER+]: Secondary | ICD-10-CM | POA: Diagnosis not present

## 2022-03-27 DIAGNOSIS — Z5111 Encounter for antineoplastic chemotherapy: Secondary | ICD-10-CM | POA: Diagnosis not present

## 2022-03-27 DIAGNOSIS — D6481 Anemia due to antineoplastic chemotherapy: Secondary | ICD-10-CM | POA: Diagnosis not present

## 2022-03-27 DIAGNOSIS — Z5189 Encounter for other specified aftercare: Secondary | ICD-10-CM | POA: Diagnosis not present

## 2022-03-27 DIAGNOSIS — R630 Anorexia: Secondary | ICD-10-CM | POA: Diagnosis not present

## 2022-03-27 DIAGNOSIS — R5383 Other fatigue: Secondary | ICD-10-CM | POA: Diagnosis not present

## 2022-03-27 MED ORDER — PEGFILGRASTIM INJECTION 6 MG/0.6ML ~~LOC~~
6.0000 mg | PREFILLED_SYRINGE | Freq: Once | SUBCUTANEOUS | Status: AC
  Administered 2022-03-27: 6 mg via SUBCUTANEOUS
  Filled 2022-03-27: qty 0.6

## 2022-03-28 ENCOUNTER — Other Ambulatory Visit (HOSPITAL_COMMUNITY): Payer: Self-pay

## 2022-03-28 MED ORDER — DOXYCYCLINE MONOHYDRATE 100 MG PO CAPS
100.0000 mg | ORAL_CAPSULE | Freq: Two times a day (BID) | ORAL | 0 refills | Status: DC
  Filled 2022-03-28: qty 14, 7d supply, fill #0

## 2022-04-01 ENCOUNTER — Encounter (HOSPITAL_COMMUNITY)
Admission: RE | Admit: 2022-04-01 | Discharge: 2022-04-01 | Disposition: A | Discharge status: Home / Self Care | Payer: 59 | Source: Ambulatory Visit | Attending: Internal Medicine | Admitting: Internal Medicine

## 2022-04-01 ENCOUNTER — Ambulatory Visit (HOSPITAL_COMMUNITY)
Admission: RE | Admit: 2022-04-01 | Discharge: 2022-04-01 | Disposition: A | Discharge status: Home / Self Care | Payer: 59 | Source: Ambulatory Visit | Attending: Internal Medicine | Admitting: Internal Medicine

## 2022-04-01 DIAGNOSIS — C73 Malignant neoplasm of thyroid gland: Secondary | ICD-10-CM | POA: Insufficient documentation

## 2022-04-01 MED ORDER — THYROTROPIN ALFA 0.9 MG IM SOLR
INTRAMUSCULAR | Status: AC
  Filled 2022-04-01: qty 0.9

## 2022-04-01 MED ORDER — THYROTROPIN ALFA 0.9 MG IM SOLR
0.9000 mg | INTRAMUSCULAR | Status: AC
  Administered 2022-04-01: 0.9 mg via INTRAMUSCULAR

## 2022-04-02 ENCOUNTER — Ambulatory Visit (HOSPITAL_COMMUNITY): Payer: 59

## 2022-04-02 ENCOUNTER — Encounter (HOSPITAL_COMMUNITY)
Admission: RE | Admit: 2022-04-02 | Discharge: 2022-04-02 | Disposition: A | Discharge status: Home / Self Care | Payer: 59 | Source: Ambulatory Visit | Attending: Internal Medicine | Admitting: Internal Medicine

## 2022-04-02 DIAGNOSIS — C73 Malignant neoplasm of thyroid gland: Secondary | ICD-10-CM | POA: Diagnosis not present

## 2022-04-02 MED ORDER — THYROTROPIN ALFA 0.9 MG IM SOLR
INTRAMUSCULAR | Status: AC
  Administered 2022-04-02: 0.9 mg via INTRAMUSCULAR
  Filled 2022-04-02: qty 0.9

## 2022-04-02 MED ORDER — THYROTROPIN ALFA 0.9 MG IM SOLR: 0.9000 mg | INTRAMUSCULAR | Status: AC

## 2022-04-03 ENCOUNTER — Encounter (HOSPITAL_COMMUNITY)
Admission: RE | Admit: 2022-04-03 | Discharge: 2022-04-03 | Disposition: A | Discharge status: Home / Self Care | Payer: 59 | Source: Ambulatory Visit | Attending: Internal Medicine | Admitting: Internal Medicine

## 2022-04-03 DIAGNOSIS — C73 Malignant neoplasm of thyroid gland: Secondary | ICD-10-CM | POA: Diagnosis not present

## 2022-04-03 MED ORDER — SODIUM IODIDE I 131 CAPSULE
70.0000 | Freq: Once | INTRAVENOUS | Status: AC | PRN
  Administered 2022-04-03: 70.8 via ORAL

## 2022-04-08 ENCOUNTER — Other Ambulatory Visit (HOSPITAL_COMMUNITY): Payer: 59

## 2022-04-09 ENCOUNTER — Encounter: Payer: Self-pay | Admitting: Internal Medicine

## 2022-04-09 ENCOUNTER — Ambulatory Visit: Payer: 59

## 2022-04-09 ENCOUNTER — Other Ambulatory Visit (HOSPITAL_COMMUNITY): Payer: Self-pay

## 2022-04-09 ENCOUNTER — Ambulatory Visit (INDEPENDENT_AMBULATORY_CARE_PROVIDER_SITE_OTHER): Payer: 59 | Admitting: Internal Medicine

## 2022-04-09 VITALS — BP 116/68 | HR 84 | Ht 62.0 in | Wt 152.0 lb

## 2022-04-09 DIAGNOSIS — C73 Malignant neoplasm of thyroid gland: Secondary | ICD-10-CM

## 2022-04-09 DIAGNOSIS — E890 Postprocedural hypothyroidism: Secondary | ICD-10-CM

## 2022-04-09 MED ORDER — LEVOTHYROXINE SODIUM 50 MCG PO TABS
100.0000 ug | ORAL_TABLET | Freq: Every day | ORAL | 3 refills | Status: DC
  Filled 2022-04-09 – 2022-04-15 (×3): qty 180, 90d supply, fill #0
  Filled 2022-07-15: qty 180, 90d supply, fill #1
  Filled 2022-10-18: qty 180, 90d supply, fill #2
  Filled 2023-01-21: qty 180, 90d supply, fill #3

## 2022-04-09 NOTE — Progress Notes (Signed)
Patient ID: [name], female   DOB: 1960/05/07, 62 y.o.   MRN: [id number]  HPI  [name] is a 62 y.o.-year-old female, initially referred by Dr. 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  Last visit 3 months ago.  Interim history: Since last visit, patient had RAI treatment 6 days ago.  She has a whole-body scan coming up tomorrow. She is undergoing Chemotherapy. She also gets Dexamethasone -for 3 days around the time of her chemotherapy sessions.  She has 1 more chemo treatment left - in 1 week. She continues to have hot flashes, quite significant.  Thyroid cancer  Patient initially had a PET scan in 10/2021 during investigation of a pulmonary nodule.  This showed a hyperactive thyroid nodule.  Thyroid ultrasound showed a 2.2 cm right thyroid nodule that met criteria for biopsy.  There was also smaller right mid thyroid nodule, measuring 1.3 cm, that only requires follow-up, and a smaller left thyroid nodule, not worrisome.  She had biopsy of the dominant thyroid nodule, which came positive for papillary thyroid carcinoma.  She had total thyroidectomy by Dr. Harlow Asa on 01/01/2022 >> stage 2 TNM PTC.  She had RAI treatment.  Reviewed available investigation in detail: PET scan (10/29/2021): Hypermetabolic heterogeneous 19 mm right thyroid nodule, suggest further evaluation with thyroid ultrasound and FNA.  Thyroid U/S (11/08/2021): Parenchymal Echotexture: Mildly heterogenous  Isthmus: 0.2 cm  Right lobe: 4.2 x 1.7 x 2.0 cm  Left lobe: 3.8 x 1.3 x 1.2 cm _________________________________________________________   Nodule # 1:  Location: Right; Mid  Maximum size: 2.2 cm; Other 2 dimensions: 2.1 x 1.7 cm  Composition: solid/almost completely solid (2)  Echogenicity: hypoechoic (2)  Margins: lobulated/irregular (2)  Echogenic foci: macrocalcifications (1)  ACR TI-RADS total points: 7. **Given size (>/= 1.0 cm) and  appearance, fine needle aspiration of this highly suspicious nodule should be considered based on TI-RADS criteria.    _________________________________________________________   Nodule # 2:  Location: Right; Mid  Maximum size: 1.3 cm; Other 2 dimensions: 1.3 x 1.0 cm  Composition: solid/almost completely solid (2)  Echogenicity: hypoechoic (2) ACR TI-RADS total points: 4. *Given size (>/= 1 - 1.4 cm) and appearance, a follow-up ultrasound in 1 year should be considered based on TI-RADS criteria.  _________________________________________________________   Nodule # 3: Small mixed cystic and solid nodule in the left mid gland does not warrant further evaluation.   IMPRESSION: 1. Approximately 2.2 cm TI-RADS category 5 nodule in the right upper gland meets criteria to consider fine-needle aspiration biopsy. Biopsy is recommended. 2. A 1.3 cm TI-RADS category 4 nodule in the right mid gland meets criteria for imaging surveillance. Recommend follow-up ultrasound in 1 year.  FNA (11/14/2021):  FINAL MICROSCOPIC DIAGNOSIS:  - Findings consistent with papillary carcinoma (Bethesda category VI)  SPECIMEN ADEQUACY:  Satisfactory for evaluation   Total thyroidectomy (01/01/2022)-Dr. Gerkin  Tumor Focality: Unifocal Tumor Site: Right lobe Tumor Size: 2.3 x 1.5 x 1.2 cm Histologic Type: Papillary carcinoma, classic and infiltrative follicular subtypes Angioinvasion: Not identified Lymphatic Invasion: Not identified Extrathyroidal Extension: Tumor focally invades perithyroidal soft tissue Margin Status: All margins negative for carcinoma Regional Lymph Node Status:      Number of Lymph Nodes with Tumor: 0      Number of Lymph Nodes Examined: 2      Nodal Level(s) Examined: Level VI Distant Metastasis: Not applicable Pathologic Stage Classification (pTNM, AJCC 8th Edition): pT2b, pN0 Tumor larger than 2 cm, but  without lymph node lymphatic, or blood vessel invasion.  Clinical TNM  stage II.  04/03/2022: RAI treatment 70.8 mCi I-131  Postsurgical hypothyroidism:  Pt is on levothyroxine 100 mcg (50 mcg x 2) daily, taken: - in am - fasting - at least 2h from b'fast - stopped calcium 2x a day - first dose in am  - no iron -  no multivitamins in am - + PPIs -Omeprazole- in am >> moved >4h later - stopped Biotin - in B complex - but not in few weeks  I reviewed pt's thyroid tests: Lab Results  Component Value Date   TSH 1.492 03/04/2022   TSH 1.77 02/01/2022   TSH 3.08 08/30/2021   TSH 2.65 03/02/2020   TSH 2.96 09/25/2018   TSH 1.37 02/11/2018   TSH 1.32 07/10/2017   TSH 2.95 05/05/2015   FREET4 1.5 02/01/2022   FREET4 1.1 05/05/2015    She has no FH of thyroid disorders. No FH of thyroid cancer.  No h/o radiation tx to head or neck.  She had a low calcium after surgery: Lab Results  Component Value Date   PTH 21 02/11/2018   CALCIUM 9.5 03/25/2022   CALCIUM 9.3 03/04/2022   CALCIUM 9.1 02/18/2022   CALCIUM 9.6 02/11/2022   CALCIUM 8.8 (L) 01/02/2022   CALCIUM 9.8 08/30/2021   CALCIUM 8.6 03/02/2020   CALCIUM 9.4 09/25/2018   CALCIUM 9.4 02/11/2018   CALCIUM 9.2 02/11/2018   CALCIUM 8.9 07/10/2017   CALCIUM 9.3 04/05/2010  She started calcium 500 mg (Tums) twice a day after the surgery >> now off.  Vitamin D levels were normal: Lab Results  Component Value Date   VD25OH 67 03/02/2020   VD25OH 60 09/25/2018   VD25OH 32 07/10/2017  She is on vitamin D 5000 units daily.  She has a past medical history of Crohn's disease, eosinophilic esophagitis, GERD, OSA, HL, lung nodule - benign. Precancerous les. In breast >> had lumpectomy 01/01/2022, at the same time as her thyroidectomy.  ROS:+ see HPI + hot flashes at night, despite HRT  Past Medical History:  Diagnosis Date   Anxiety    Allergy induced asthma   Arthritis    Asthma    Breast cancer ([place])    Crohn disease ([place])    Depression    GERD (gastroesophageal reflux disease)     History of methicillin resistant staphylococcus aureus (MRSA)    pt denies this.   Multiple thyroid nodules    Pneumonia    Sleep apnea    CPAP   Thyroid cancer [facility])    Past Surgical History:  Procedure Laterality Date   ANAL FISSURE REPAIR  2004   BREAST LUMPECTOMY WITH RADIOACTIVE SEED LOCALIZATION Left 01/01/2022   Procedure: LEFT BREAST SEED LUMPECTOMY;  Surgeon: Erroll Luna, MD;  Location: [place];  Service: General;  Laterality: Left;   CESAREAN SECTION     COLONOSCOPY     INSERTION OF MESH N/A 07/26/2020   Procedure: INSERTION OF MESH;  Surgeon: Ralene Ok, MD;  Location: [place];  Service: General;  Laterality: N/A;   LIGAMENT REPAIR Right 11/23/2015   Procedure: right index radial collateral LIGAMENT REPAIR;  Surgeon: Leanora Cover, MD;  Location: [place];  Service: Orthopedics;  Laterality: Right;   PORTACATH PLACEMENT Right 02/07/2022   Procedure: INSERTION PORT-A-CATH;  Surgeon: Erroll Luna, MD;  Location: [place];  Service: General;  Laterality: Right;   RE-EXCISION OF BREAST LUMPECTOMY Left 01/22/2022   Procedure: RE-EXCISION OF  LEFT BREAST LUMPECTOMY;  Surgeon: Erroll Luna, MD;  Location: [place];  Service: General;  Laterality: Left;   SENTINEL NODE BIOPSY Left 01/22/2022   Procedure: LEFT SENTINEL NODE BIOPSY;  Surgeon: Erroll Luna, MD;  Location: [place];  Service: General;  Laterality: Left;   THYROIDECTOMY N/A 01/01/2022   Procedure: TOTAL THYROIDECTOMY WITH LIMITED LYMPH NODE DISSECTION;  Surgeon: Armandina Gemma, MD;  Location: [employer];  Service: General;  Laterality: N/A;   UPPER ENDOSCOPY W/ ESOPHAGEAL MANOMETRY  10/2021   VENTRAL HERNIA REPAIR N/A 07/26/2020   Procedure: LAPAROSCOPIC VENTRAL HERNIA REPAIR WITH MESH;  Surgeon: Ralene Ok, MD;  Location: [place];  Service: General;  Laterality: N/A;   Social History   Socioeconomic History   Marital status: Married    Spouse name: Not on file    Number of children: 2   Years of education: Not on file   Highest education level: Not on file  Occupational History   Occupation: Self employed  Tobacco Use   Smoking status: Never    Passive exposure: Never   Smokeless tobacco: Never  Vaping Use   Vaping Use: Never used  Substance and Sexual Activity   Alcohol use: Yes    Alcohol/week: 10.0 standard drinks of alcohol    Types: 10 Glasses of wine per week    Comment: 10 glasses/week   Drug use: No   Sexual activity: Yes    Birth control/protection: Post-menopausal  Other Topics Concern   Not on file  Social History Narrative   Not on file   Social Determinants of Health   Financial Resource Strain: Not on file  Food Insecurity: Not on file  Transportation Needs: Not on file  Physical Activity: Not on file  Stress: Not on file  Social Connections: Not on file  Intimate Partner Violence: Not on file   Current Outpatient Medications on File Prior to Visit  Medication Sig Dispense Refill   Adalimumab (HUMIRA, 2 SYRINGE,) 40 MG/0.4ML PSKT Inject 0.4 mLs (40 mg total) under the skin every 14 (fourteen) days. (Patient not taking: Reported on 03/04/2022) 2 each 5   albuterol (VENTOLIN HFA) 108 (90 Base) MCG/ACT inhaler Inhale 2 puffs into the lungs every 6 (six) hours as needed for wheezing or shortness of breath. (Patient not taking: Reported on 03/04/2022) 8.5 g 3   ascorbic acid (VITAMIN C) 500 MG tablet Take 500 mg by mouth 2 (two) times a week.     bimatoprost (LUMIGAN) 0.01 % SOLN Instill 1 drop into both eyes at bedtime 2.5 mL 10   busPIRone (BUSPAR) 7.5 MG tablet Take 1 tablet (7.5 mg total) by mouth 2 (two) times daily. 180 tablet 2   celecoxib (CELEBREX) 100 MG capsule Take 1 capsule (100 mg total) by mouth in the morning and at bedtime. (Patient not taking: Reported on 02/04/2022) 60 capsule 11   cetirizine (ZYRTEC) 10 MG tablet Take 10 mg by mouth in the morning. (Patient not taking: Reported on 03/04/2022)      chlorhexidine (PERIDEX) 0.12 % solution After brushing, rinse with one capful for one minute twice a day then spit out 473 mL 0   dexamethasone (DECADRON) 4 MG tablet Take 1 tablet day before chemo and 1 tablet day after chemo with food. (Patient taking differently: Take 4 mg by mouth See admin instructions. Take 4 mg one day before Chemo and 4 mg the day after chemo) 10 tablet 0   doxycycline (MONODOX) 100 MG capsule Take  1 capsule (100 mg total) by mouth 2 (two) times daily for 7 days 14 capsule 0   DULoxetine (CYMBALTA) 60 MG capsule Take 1 capsule (60 mg total) by mouth daily. 90 capsule 1   fluocinonide ointment (LIDEX) [id number] % Apply 1 application  topically daily as needed (irritation). (Patient not taking: Reported on 03/04/2022)     levothyroxine (SYNTHROID) 50 MCG tablet Take 2 tablets (100 mcg total) by mouth daily before breakfast. 60 tablet 3   lidocaine-prilocaine (EMLA) cream Apply to affected area once 30 g 3   omeprazole (PRILOSEC) 20 MG capsule Take 1 capsule (20 mg total) by mouth daily. 90 capsule 3   ondansetron (ZOFRAN) 8 MG tablet Take 1 tablet (8 mg total) by mouth every 8 (eight) hours as needed for nausea or vomiting. Start on the third day after chemotherapy. 30 tablet 1   prochlorperazine (COMPAZINE) 10 MG tablet Take 1 tablet (10 mg total) by mouth every 6 (six) hours as needed for nausea or vomiting. (Patient not taking: Reported on 03/04/2022) 30 tablet 1   Vitamin D, Ergocalciferol, (DRISDOL) 1.25 MG (50000 UNIT) CAPS capsule Take 1 capsule (50,000 Units total) by mouth every 7 (seven) days. 12 capsule 0   Current Facility-Administered Medications on File Prior to Visit  Medication Dose Route Frequency Provider Last Rate Last Admin   0.9 %  sodium chloride infusion  500 mL Intravenous Continuous Armbruster, Carlota Raspberry, MD       Allergies  Allergen Reactions   Chlorhexidine Gluconate Itching   Gabapentin Itching   Hydrocodone Itching   Oxycodone Hcl Itching    NDC  [id number]  NDC [id number]   Betadine [Povidone Iodine] Rash   Family History  Problem Relation Age of Onset   Alcohol abuse Mother    Alcohol abuse Sister    Angioedema Neg Hx    Allergic rhinitis Neg Hx    Asthma Neg Hx    Atopy Neg Hx    Eczema Neg Hx    Immunodeficiency Neg Hx    Urticaria Neg Hx    Colon cancer Neg Hx    Stomach cancer Neg Hx    Esophageal cancer Neg Hx    Colon polyps Neg Hx   + HL in uncle, aunt  PE: BP 116/68   Pulse 84   Ht 5\' 2"  (1.575 m)   Wt 152 lb (68.9 kg)   LMP 01/10/2014   SpO2 99%   BMI 27.80 kg/m  Wt Readings from Last 3 Encounters:  04/09/22 152 lb (68.9 kg)  03/25/22 152 lb 3.2 oz (69 kg)  03/04/22 151 lb 9.6 oz (68.8 kg)   Constitutional: normal weight, in NAD Eyes: PERRLA, EOMI, no exophthalmos ENT: no neck masses palpated, thyroidectomy scar healed, no cervical lymphadenopathy Cardiovascular: RRR, No MRG Respiratory: CTA B Musculoskeletal: no deformities Skin: no rashes Neurological: no tremor with outstretched hands  ASSESSMENT: 1. Thyroid cancer - see HPI  2. Postsurgical Hypothyroidism  3.  Postsurgical hypocalcemia  PLAN:  1.  Papillary thyroid cancer -Patient with recent history of thyroid cancer, status post total thyroidectomy by Dr. Harlow Asa 01/01/2022.  She had both classic and infiltrative type of PTC, with the tumor invading the surrounding thyroid tissue.  We did discuss that for started tomorrow, radioactive iodine was recommended only to ablate the remnant thyroid and possible micrometastasis, but also to allow for further follow-up by thyroglobulin.  -She had RAI treatment 6 days ago and has the post treatment whole-body scan tomorrow. -It is  too soon to check her thyroglobulin now, but will do so at next visit  -Discussed about the fact that the trend will be valuable as a sensitive marker to detect cancer recurrence - I will then see the patient in approximately 4 months  2.  Patient with  h/o total thyroidectomy for cancer, now with iatrogenic hypothyroidism, on levothyroxine therapy. - latest thyroid labs reviewed with pt. >> normal: Lab Results  Component Value Date   TSH 1.492 03/04/2022  - she continues on LT4 50 x 2 mcg daily (changed to white tablets due to intolerance to dyes -she tolerates this well) - pt feels good on this dose.  However, she has hot flashes.  These could be related to dexamethasone, but she was mentioning them at last visit, when she was not on the steroid anymore.  I wonder if they can be related to the omeprazole.  However, she cannot stop the omeprazole due to hypercalcemia feeling esophagitis. - we discussed about taking the thyroid hormone every day, with water, >30 minutes before breakfast, separated by >4 hours from acid reflux medications, calcium, iron, multivitamins. Pt. is taking it correctly.  At last visit, she was taking calcium and omeprazole too close to levothyroxine and I advised her to move these later.  She is currently off calcium and omeprazole is now later in the day - will check thyroid tests at next OV: TSH and fT4  3.  Postsurgical hypocalcemia -She had a slightly low calcium after surgery -At last visit she was on 500 mg of calcium twice a day.  We moved the first dose with lunch as she was taking it with breakfast, close to levothyroxine.  We discussed that if she needed to continue with calcium, since she was on a PPI, calcium citrate was recommended.  However, she came off calcium afterwards.  She continues with 5000 units vitamin D daily.  She mentions that she has been on this dose for few years. -no perioral numbness or acral cramping -Latest calcium level was reviewed and this was normal: Lab Results  Component Value Date   CALCIUM 9.5 03/25/2022  - will check a vitamin D at next OV  Philemon Kingdom, MD PhD [facility]

## 2022-04-09 NOTE — Patient Instructions (Addendum)
Please continue levothyroxine 100 mcg daily.  Take the thyroid hormone every day, with water, at least 30 minutes before breakfast, separated by at least 4 hours from: - acid reflux medications - calcium - iron - multivitamins  Please return to see me in approximately 4 months.

## 2022-04-09 NOTE — Progress Notes (Signed)
Patient Care Team: Elby Showers, MD as PCP - General (Internal Medicine) Mauro Kaufmann, RN as Oncology Nurse Navigator Rockwell Germany, RN as Oncology Nurse Navigator Nicholas Lose, MD as Consulting Physician (Hematology and Oncology) Nicholas Lose, MD as Consulting Physician (Hematology and Oncology)  DIAGNOSIS:  Encounter Diagnosis  Name Primary?   Malignant neoplasm of upper-inner quadrant of left breast in female, estrogen receptor positive Yes    SUMMARY OF ONCOLOGIC HISTORY: Oncology History  Malignant neoplasm of upper-inner quadrant of left breast in female, estrogen receptor positive  01/01/2022 Surgery   Initial biopsy left breast: ALH Left lumpectomy: 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, lateral margin positive ER 90%, PR 100%, Ki-67 5%, HER2 equivocal by IHC 2+, FISH negative ratio 0.93 (Total thyroidectomy: Papillary carcinoma right lobe, classic and follicular subtype 2.3 cm) 0/2 lymph nodes)   01/16/2022 Cancer Staging   Staging form: Breast, AJCC 8th Edition - Clinical: Stage IA (cT1c, cN0, cM0, G2, ER+, PR+, HER2-) - Signed by Nicholas Lose, MD on 01/16/2022 Histologic grading system: 3 grade system   01/22/2022 Surgery   Margin reexcision surgery: Benign margins, sentinel lymph node 0/1   01/23/2022 Oncotype testing   Recurrence score: 31 (19% distant recurrence risk at 9 years)   02/11/2022 -  Chemotherapy   Patient is on Treatment Plan : BREAST TC q21d       CHIEF COMPLIANT:  estrogen receptor positive breast cancer Cycle 4 TC    INTERVAL HISTORY: [name] is a 62 year old with above-mentioned history of estrogen receptor positive breast cancer. She presents to the clinic for a follow-up. She report last treatment she didn't have diarrhea. She complains of not able to taste salt. She does have metallic taste, she says fatigue is about the same. She complains of headaches it might be due to tiredness. 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 Denies any tingling or numbness in fingers or toes.     ALLERGIES:  is allergic to chlorhexidine gluconate, gabapentin, hydrocodone, oxycodone hcl, and betadine [povidone iodine].  MEDICATIONS:  Current Outpatient Medications  Medication Sig Dispense Refill   ascorbic acid (VITAMIN C) 500 MG tablet Take 500 mg by mouth 2 (two) times a week.     bimatoprost (LUMIGAN) 0.01 % SOLN Instill 1 drop into both eyes at bedtime 2.5 mL 10   busPIRone (BUSPAR) 7.5 MG tablet Take 1 tablet (7.5 mg total) by mouth 2 (two) times daily. 180 tablet 2   celecoxib (CELEBREX) 100 MG capsule Take 1 capsule (100 mg total) by mouth in the morning and at bedtime. 60 capsule 11   cetirizine (ZYRTEC) 10 MG tablet Take 10 mg by mouth in the morning.     dexamethasone (DECADRON) 4 MG tablet Take 1 tablet day before chemo and 1 tablet day after chemo with food. (Patient taking differently: Take 4 mg by mouth See admin instructions. Take 4 mg one day before Chemo and 4 mg the day after chemo) 10 tablet 0   doxycycline (MONODOX) 100 MG capsule Take 1 capsule (100 mg total) by mouth 2 (two) times daily for 7 days 14 capsule 0   DULoxetine (CYMBALTA) 60 MG capsule Take 1 capsule (60 mg total) by mouth daily. 90 capsule 1   fluocinonide ointment (LIDEX) [id number] % Apply 1 application  topically daily as needed (irritation).     levothyroxine (SYNTHROID) 50 MCG tablet Take 2 tablets (100 mcg total) by mouth daily before breakfast. 180 tablet  3   lidocaine-prilocaine (EMLA) cream Apply to affected area once 30 g 3   omeprazole (PRILOSEC) 20 MG capsule Take 1 capsule (20 mg total) by mouth daily. 90 capsule 3   Vitamin D, Ergocalciferol, (DRISDOL) 1.25 MG (50000 UNIT) CAPS capsule Take 1 capsule (50,000 Units total) by mouth every 7 (seven) days. 12 capsule 0   Adalimumab (HUMIRA, 2 SYRINGE,) 40 MG/0.4ML PSKT Inject 0.4 mLs (40 mg total) under the skin every 14 (fourteen) days. (Patient not taking: Reported on 04/15/2022) 2 each 5    albuterol (VENTOLIN HFA) 108 (90 Base) MCG/ACT inhaler Inhale 2 puffs into the lungs every 6 (six) hours as needed for wheezing or shortness of breath. (Patient not taking: Reported on 04/15/2022) 8.5 g 3   ondansetron (ZOFRAN) 8 MG tablet Take 1 tablet (8 mg total) by mouth every 8 (eight) hours as needed for nausea or vomiting. Start on the third day after chemotherapy. (Patient not taking: Reported on 04/15/2022) 30 tablet 1   prochlorperazine (COMPAZINE) 10 MG tablet Take 1 tablet (10 mg total) by mouth every 6 (six) hours as needed for nausea or vomiting. (Patient not taking: Reported on 04/15/2022) 30 tablet 1   Current Facility-Administered Medications  Medication Dose Route Frequency Provider Last Rate Last Admin   0.9 %  sodium chloride infusion  500 mL Intravenous Continuous Armbruster, Carlota Raspberry, MD        PHYSICAL EXAMINATION: ECOG PERFORMANCE STATUS: 1 - Symptomatic but completely ambulatory  Vitals:   04/15/22 0850  BP: (!) 125/50  Pulse: 96  Resp: 18  Temp: (!) 97.5 F (36.4 C)  SpO2: 98%   Filed Weights   04/15/22 0850  Weight: 153 lb 3.2 oz (69.5 kg)      LABORATORY DATA:  I have reviewed the data as listed    Latest Ref Rng & Units 03/25/2022    9:25 AM 03/04/2022    9:25 AM 02/18/2022    9:42 AM  CMP  Glucose 70 - 99 mg/dL 90  104  79   BUN 8 - 23 mg/dL 17  13  12    Creatinine 0.44 - 1.00 mg/dL 0.64  0.54  0.68   Sodium 135 - 145 mmol/L 138  138  136   Potassium 3.5 - 5.1 mmol/L 3.7  3.9  3.7   Chloride 98 - 111 mmol/L 105  104  104   CO2 22 - 32 mmol/L 26  27  28    Calcium 8.9 - 10.3 mg/dL 9.5  9.3  9.1   Total Protein 6.5 - 8.1 g/dL 6.9  7.3  6.3   Total Bilirubin 0.3 - 1.2 mg/dL 0.5  0.3  0.4   Alkaline Phos 38 - 126 U/L 55  74  52   AST 15 - 41 U/L 18  29  17    ALT 0 - 44 U/L 22  37  16     Lab Results  Component Value Date   WBC 8.0 04/15/2022   HGB 10.0 (L) 04/15/2022   HCT 29.6 (L) 04/15/2022   MCV 94.0 04/15/2022   PLT 275 04/15/2022    NEUTROABS 5.8 04/15/2022    ASSESSMENT & PLAN:  Malignant neoplasm of upper-inner quadrant of left breast in female, estrogen receptor positive ([name]) 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, lateral margin positive ER 90%, PR 100%, Ki-67 5%, HER2 equivocal by IHC 2+, FISH negative ratio 0.93 (Total thyroidectomy: Papillary carcinoma right  lobe, 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  On probiotics: Diarrhea appears to be better chemo-induced anemia: Monitoring closely today's hemoglobin is 10 Lack of taste and appetite with a metallic taste when she eats certain foods: I reassured her that it will recover.  Unclear if the radioactive iodine treatment had any reason to make her lose her taste for salt.     Return to clinic after radiation therapy is complete to discuss antiestrogen therapy.    No orders of the defined types were placed in this encounter.  The patient has a good understanding of the overall plan. she agrees with it. she will call with any problems that may develop before the next visit here. Total time spent: 30 mins including face to face time and time spent for planning, charting and co-ordination of care   Harriette Ohara, MD 04/15/22    I Gardiner Coins am acting as a Education administrator for [employer]  I have reviewed the  above documentation for accuracy and completeness, and I agree with the above.

## 2022-04-10 ENCOUNTER — Encounter (HOSPITAL_COMMUNITY)
Admission: RE | Admit: 2022-04-10 | Discharge: 2022-04-10 | Disposition: A | Discharge status: Home / Self Care | Payer: 59 | Source: Ambulatory Visit | Attending: Internal Medicine | Admitting: Internal Medicine

## 2022-04-10 DIAGNOSIS — C73 Malignant neoplasm of thyroid gland: Secondary | ICD-10-CM | POA: Insufficient documentation

## 2022-04-12 MED FILL — Dexamethasone Sodium Phosphate Inj 100 MG/10ML: INTRAMUSCULAR | Qty: 1 | Status: AC

## 2022-04-12 MED FILL — Fosaprepitant Dimeglumine For IV Infusion 150 MG (Base Eq): INTRAVENOUS | Qty: 5 | Status: AC

## 2022-04-13 ENCOUNTER — Other Ambulatory Visit: Payer: Self-pay

## 2022-04-15 ENCOUNTER — Inpatient Hospital Stay: Payer: 59 | Attending: Hematology and Oncology

## 2022-04-15 ENCOUNTER — Inpatient Hospital Stay (HOSPITAL_BASED_OUTPATIENT_CLINIC_OR_DEPARTMENT_OTHER): Payer: 59 | Admitting: Hematology and Oncology

## 2022-04-15 ENCOUNTER — Encounter: Payer: Self-pay | Admitting: *Deleted

## 2022-04-15 ENCOUNTER — Other Ambulatory Visit: Payer: Self-pay

## 2022-04-15 ENCOUNTER — Inpatient Hospital Stay: Payer: 59

## 2022-04-15 ENCOUNTER — Other Ambulatory Visit (HOSPITAL_COMMUNITY): Payer: Self-pay

## 2022-04-15 VITALS — BP 117/64 | HR 79

## 2022-04-15 VITALS — BP 125/50 | HR 96 | Temp 97.5°F | Resp 18 | Ht 62.0 in | Wt 153.2 lb

## 2022-04-15 DIAGNOSIS — Z170 Estrogen receptor positive status [ER+]: Secondary | ICD-10-CM | POA: Insufficient documentation

## 2022-04-15 DIAGNOSIS — D6481 Anemia due to antineoplastic chemotherapy: Secondary | ICD-10-CM | POA: Diagnosis not present

## 2022-04-15 DIAGNOSIS — C50212 Malignant neoplasm of upper-inner quadrant of left female breast: Secondary | ICD-10-CM

## 2022-04-15 DIAGNOSIS — Z95828 Presence of other vascular implants and grafts: Secondary | ICD-10-CM

## 2022-04-15 DIAGNOSIS — Z5189 Encounter for other specified aftercare: Secondary | ICD-10-CM | POA: Insufficient documentation

## 2022-04-15 DIAGNOSIS — Z5111 Encounter for antineoplastic chemotherapy: Secondary | ICD-10-CM | POA: Diagnosis not present

## 2022-04-15 LAB — CBC WITH DIFFERENTIAL (CANCER CENTER ONLY)
Abs Immature Granulocytes: 0.04 10*3/uL (ref 0.00–0.07)
Basophils Absolute: 0 10*3/uL (ref 0.0–0.1)
Basophils Relative: 0 %
Eosinophils Absolute: 0 10*3/uL (ref 0.0–0.5)
Eosinophils Relative: 0 %
HCT: 29.6 % — ABNORMAL LOW (ref 36.0–46.0)
Hemoglobin: 10 g/dL — ABNORMAL LOW (ref 12.0–15.0)
Immature Granulocytes: 1 %
Lymphocytes Relative: 16 %
Lymphs Abs: 1.3 10*3/uL (ref 0.7–4.0)
MCH: 31.7 pg (ref 26.0–34.0)
MCHC: 33.8 g/dL (ref 30.0–36.0)
MCV: 94 fL (ref 80.0–100.0)
Monocytes Absolute: 0.8 10*3/uL (ref 0.1–1.0)
Monocytes Relative: 11 %
Neutro Abs: 5.8 10*3/uL (ref 1.7–7.7)
Neutrophils Relative %: 72 %
Platelet Count: 275 10*3/uL (ref 150–400)
RBC: 3.15 MIL/uL — ABNORMAL LOW (ref 3.87–5.11)
RDW: 16.1 % — ABNORMAL HIGH (ref 11.5–15.5)
WBC Count: 8 10*3/uL (ref 4.0–10.5)
nRBC: 0 % (ref 0.0–0.2)

## 2022-04-15 LAB — CMP (CANCER CENTER ONLY)
ALT: 21 U/L (ref 0–44)
AST: 19 U/L (ref 15–41)
Albumin: 4.2 g/dL (ref 3.5–5.0)
Alkaline Phosphatase: 53 U/L (ref 38–126)
Anion gap: 8 (ref 5–15)
BUN: 16 mg/dL (ref 8–23)
CO2: 23 mmol/L (ref 22–32)
Calcium: 9.5 mg/dL (ref 8.9–10.3)
Chloride: 108 mmol/L (ref 98–111)
Creatinine: 0.64 mg/dL (ref 0.44–1.00)
GFR, Estimated: >60 mL/min (ref >60)
Glucose, Bld: 96 mg/dL (ref 70–99)
Potassium: 3.8 mmol/L (ref 3.5–5.1)
Sodium: 139 mmol/L (ref 135–145)
Total Bilirubin: 0.4 mg/dL (ref 0.3–1.2)
Total Protein: 6.8 g/dL (ref 6.5–8.1)

## 2022-04-15 MED ORDER — PALONOSETRON HCL INJECTION 0.25 MG/5ML
0.2500 mg | Freq: Once | INTRAVENOUS | Status: AC
  Administered 2022-04-15: 0.25 mg via INTRAVENOUS
  Filled 2022-04-15: qty 5

## 2022-04-15 MED ORDER — SODIUM CHLORIDE 0.9 % IV SOLN
150.0000 mg | Freq: Once | INTRAVENOUS | Status: AC
  Administered 2022-04-15: 150 mg via INTRAVENOUS
  Filled 2022-04-15: qty 150

## 2022-04-15 MED ORDER — SODIUM CHLORIDE 0.9 % IV SOLN
600.0000 mg/m2 | Freq: Once | INTRAVENOUS | Status: AC
  Administered 2022-04-15: 1000 mg via INTRAVENOUS
  Filled 2022-04-15: qty 50

## 2022-04-15 MED ORDER — SODIUM CHLORIDE 0.9 % IV SOLN
75.0000 mg/m2 | Freq: Once | INTRAVENOUS | Status: AC
  Administered 2022-04-15: 129 mg via INTRAVENOUS
  Filled 2022-04-15: qty 12.9

## 2022-04-15 MED ORDER — HEPARIN SOD (PORK) LOCK FLUSH 100 UNIT/ML IV SOLN
500.0000 [IU] | Freq: Once | INTRAVENOUS | Status: AC | PRN
  Administered 2022-04-15: 500 [IU]

## 2022-04-15 MED ORDER — SODIUM CHLORIDE 0.9% FLUSH
10.0000 mL | INTRAVENOUS | Status: DC | PRN
  Administered 2022-04-15: 10 mL

## 2022-04-15 MED ORDER — SODIUM CHLORIDE 0.9 % IV SOLN
10.0000 mg | Freq: Once | INTRAVENOUS | Status: AC
  Administered 2022-04-15: 10 mg via INTRAVENOUS
  Filled 2022-04-15: qty 10

## 2022-04-15 MED ORDER — SODIUM CHLORIDE 0.9 % IV SOLN: Freq: Once | INTRAVENOUS | Status: AC

## 2022-04-15 MED ORDER — SODIUM CHLORIDE 0.9% FLUSH
10.0000 mL | Freq: Once | INTRAVENOUS | Status: AC
  Administered 2022-04-15: 10 mL

## 2022-04-15 NOTE — Progress Notes (Signed)
Docetaxel diluted in NS289mL Excel bag, Lot:  [id number], Exp:  10/14/2023  Acquanetta Belling, RPH, BCPS, BCOP 04/15/2022 9:59 AM

## 2022-04-15 NOTE — Assessment & Plan Note (Addendum)
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  On probiotics: Diarrhea appears to be better chemo-induced anemia: Monitoring closely today's hemoglobin is 10 Lack of taste and appetite with a metallic taste when she eats certain foods: I reassured her that it will recover.   Return to clinic after radiation therapy is complete.

## 2022-04-15 NOTE — Patient Instructions (Signed)
[facility]  Discharge Instructions: Thank you for choosing [facility] to provide your oncology and hematology care.   If you have a lab appointment with the [facility], please go directly to the [facility] and check in at the registration area.   Wear comfortable clothing and clothing appropriate for easy access to any Portacath or PICC line.   We strive to give you quality time with your provider. You may need to reschedule your appointment if you arrive late (15 or more minutes).  Arriving late affects you and other patients whose appointments are after yours.  Also, if you miss three or more appointments without notifying the office, you may be dismissed from the clinic at the provider's discretion.      For prescription refill requests, have your pharmacy contact our office and allow 72 hours for refills to be completed.    Today you received the following chemotherapy and/or immunotherapy agents: Docetaxel, Cyclophosphamide      To help prevent nausea and vomiting after your treatment, we encourage you to take your nausea medication as directed.  BELOW ARE SYMPTOMS THAT SHOULD BE REPORTED IMMEDIATELY: *FEVER GREATER THAN 100.4 F (38 C) OR HIGHER *CHILLS OR SWEATING *NAUSEA AND VOMITING THAT IS NOT CONTROLLED WITH YOUR NAUSEA MEDICATION *UNUSUAL SHORTNESS OF BREATH *UNUSUAL BRUISING OR BLEEDING *URINARY PROBLEMS (pain or burning when urinating, or frequent urination) *BOWEL PROBLEMS (unusual diarrhea, constipation, pain near the anus) TENDERNESS IN MOUTH AND THROAT WITH OR WITHOUT PRESENCE OF ULCERS (sore throat, sores in mouth, or a toothache) UNUSUAL RASH, SWELLING OR PAIN  UNUSUAL VAGINAL DISCHARGE OR ITCHING   Items with * indicate a potential emergency and should be followed up as soon as possible or go to the Emergency Department if any problems should occur.  Please show the CHEMOTHERAPY ALERT CARD or IMMUNOTHERAPY  ALERT CARD at check-in to the Emergency Department and triage nurse.  Should you have questions after your visit or need to cancel or reschedule your appointment, please contact [facility]  Dept: [phone]  and follow the prompts.  Office hours are 8:00 a.m. to 4:30 p.m. Monday - Friday. Please note that voicemails left after 4:00 p.m. may not be returned until the following business day.  We are closed weekends and major holidays. You have access to a nurse at all times for urgent questions. Please call the main number to the clinic Dept: [phone] and follow the prompts.   For any non-urgent questions, you may also contact your provider using MyChart. We now offer e-Visits for anyone 25 and older to request care online for non-urgent symptoms. For details visit [email or web address].   Also download the MyChart app! Go to the app store, search "MyChart", open the app, select [facility], and log in with your MyChart username and password.

## 2022-04-17 ENCOUNTER — Inpatient Hospital Stay: Payer: 59

## 2022-04-17 VITALS — BP 112/77 | HR 86 | Temp 98.7°F | Resp 16

## 2022-04-17 DIAGNOSIS — Z170 Estrogen receptor positive status [ER+]: Secondary | ICD-10-CM

## 2022-04-17 DIAGNOSIS — Z5189 Encounter for other specified aftercare: Secondary | ICD-10-CM | POA: Diagnosis not present

## 2022-04-17 DIAGNOSIS — D6481 Anemia due to antineoplastic chemotherapy: Secondary | ICD-10-CM | POA: Diagnosis not present

## 2022-04-17 DIAGNOSIS — C50212 Malignant neoplasm of upper-inner quadrant of left female breast: Secondary | ICD-10-CM | POA: Diagnosis not present

## 2022-04-17 DIAGNOSIS — Z5111 Encounter for antineoplastic chemotherapy: Secondary | ICD-10-CM | POA: Diagnosis not present

## 2022-04-17 MED ORDER — PEGFILGRASTIM INJECTION 6 MG/0.6ML ~~LOC~~
6.0000 mg | PREFILLED_SYRINGE | Freq: Once | SUBCUTANEOUS | Status: AC
  Administered 2022-04-17: 6 mg via SUBCUTANEOUS
  Filled 2022-04-17: qty 0.6

## 2022-04-22 ENCOUNTER — Encounter: Payer: Self-pay | Admitting: *Deleted

## 2022-04-28 NOTE — Progress Notes (Signed)
Radiation Oncology         [phone]) [phone] ________________________________  Name: [name] MRN: [id number]  Date: 04/29/2022  DOB: [birth year]  Re-Evaluation Note  CC: Margaree Mackintosh, MD  Serena Croissant, MD  No diagnosis found.  Diagnosis:   The encounter diagnosis was Malignant neoplasm of upper-inner quadrant of left breast in female, estrogen receptor positive (HCC).   Stage IA (cT1c, cN0, cM0) Left Breast UIQ, Invasive ductal adenocarcinoma, ER+ / PR+ / Her2-, Grade 2 : s/p lumpectomy and adjuvant chemotherapy with TC x 4 cycles    Concurrent diagnosis of thyroid cancer : s/p total thyroidectomy  and radioactive iodine therapy   Narrative:  The patient returns today to discuss radiation treatment options. She was seen in consultation on 01/30/22.   She has been treated with TC x 4 cycles from 02/01/22 through 04/15/22 under Dr. Pamelia Hoit. Chemo toxicities encountered by the patient included mild nausea, intermittent diarrhea (in part due to crohn's disease), chemo induced anemia, and a lack of taste and lack of appetite with a metallic taste when she eats certain foods (improving).   The patient will return to Dr. Pamelia Hoit following XRT to discuss antiestrogen treatment options.   With regards to her thyroid cancer (s/p total thyroidectomy), the patient continues to follow with endocrinology (Dr. Elvera Lennox) and had an RAI treatment performed on 04/01/22. Her current treatment consists of LT4 50 x 2 mcg daily. She recently had a whole body scan performed on 04/10/22 which showed no evidence of thyroid remnant or iodine avid metastatic thyroid cancer.  Of note: complete resolution of her left axillary seroma was noted during her most recent follow-up visit with Dr. Luisa Hart on 04/22/22.  On review of systems, the patient reports ***. She denies *** and any other symptoms.    Allergies:  is allergic to chlorhexidine gluconate, gabapentin, hydrocodone, oxycodone hcl, and betadine  [povidone iodine].  Meds: Current Outpatient Medications  Medication Sig Dispense Refill   Adalimumab (HUMIRA, 2 SYRINGE,) 40 MG/0.4ML PSKT Inject 0.4 mLs (40 mg total) under the skin every 14 (fourteen) days. (Patient not taking: Reported on 04/15/2022) 2 each 5   albuterol (VENTOLIN HFA) 108 (90 Base) MCG/ACT inhaler Inhale 2 puffs into the lungs every 6 (six) hours as needed for wheezing or shortness of breath. (Patient not taking: Reported on 04/15/2022) 8.5 g 3   ascorbic acid (VITAMIN C) 500 MG tablet Take 500 mg by mouth 2 (two) times a week.     bimatoprost (LUMIGAN) 0.01 % SOLN Instill 1 drop into both eyes at bedtime 2.5 mL 10   busPIRone (BUSPAR) 7.5 MG tablet Take 1 tablet (7.5 mg total) by mouth 2 (two) times daily. 180 tablet 2   celecoxib (CELEBREX) 100 MG capsule Take 1 capsule (100 mg total) by mouth in the morning and at bedtime. 60 capsule 11   cetirizine (ZYRTEC) 10 MG tablet Take 10 mg by mouth in the morning.     dexamethasone (DECADRON) 4 MG tablet Take 1 tablet day before chemo and 1 tablet day after chemo with food. (Patient taking differently: Take 4 mg by mouth See admin instructions. Take 4 mg one day before Chemo and 4 mg the day after chemo) 10 tablet 0   doxycycline (MONODOX) 100 MG capsule Take 1 capsule (100 mg total) by mouth 2 (two) times daily for 7 days 14 capsule 0   DULoxetine (CYMBALTA) 60 MG capsule Take 1 capsule (60 mg total) by mouth daily. 90 capsule 1  fluocinonide ointment (LIDEX) 0.05 % Apply 1 application  topically daily as needed (irritation).     levothyroxine (SYNTHROID) 50 MCG tablet Take 2 tablets (100 mcg total) by mouth daily before breakfast. 180 tablet 3   lidocaine-prilocaine (EMLA) cream Apply to affected area once 30 g 3   omeprazole (PRILOSEC) 20 MG capsule Take 1 capsule (20 mg total) by mouth daily. 90 capsule 3   ondansetron (ZOFRAN) 8 MG tablet Take 1 tablet (8 mg total) by mouth every 8 (eight) hours as needed for nausea or vomiting.  Start on the third day after chemotherapy. (Patient not taking: Reported on 04/15/2022) 30 tablet 1   prochlorperazine (COMPAZINE) 10 MG tablet Take 1 tablet (10 mg total) by mouth every 6 (six) hours as needed for nausea or vomiting. (Patient not taking: Reported on 04/15/2022) 30 tablet 1   Vitamin D, Ergocalciferol, (DRISDOL) 1.25 MG (50000 UNIT) CAPS capsule Take 1 capsule (50,000 Units total) by mouth every 7 (seven) days. 12 capsule 0   Current Facility-Administered Medications  Medication Dose Route Frequency Provider Last Rate Last Admin   0.9 %  sodium chloride infusion  500 mL Intravenous Continuous Armbruster, Willaim Rayas, MD        Physical Findings: The patient is in no acute distress. Patient is alert and oriented.  vitals were not taken for this visit.  No significant changes. Lungs are clear to auscultation bilaterally. Heart has regular rate and rhythm. No palpable cervical, supraclavicular, or axillary adenopathy. Abdomen soft, non-tender, normal bowel sounds. Right Breast: no palpable mass, nipple discharge or bleeding. Left Breast: ***  Lab Findings: Lab Results  Component Value Date   WBC 8.0 04/15/2022   HGB 10.0 (L) 04/15/2022   HCT 29.6 (L) 04/15/2022   MCV 94.0 04/15/2022   PLT 275 04/15/2022    Radiographic Findings: NM Whole Body I131 Scan S/P Ca Rx  Result Date: 04/10/2022 CLINICAL DATA:  Papillary thyroid cancer 7 days post radioactive iodine therapy with 70.8 millicuries of I 131 sodium iodide EXAM: NUCLEAR MEDICINE I-131 POST THERAPY WHOLE BODY SCAN TECHNIQUE: The patient received 70.8 mCi I-131 sodium iodide for the treatment of thyroid cancer within the past 10 days. The patient returns today, and whole body scanning was performed in the anterior and posterior projections. COMPARISON:  None available FINDINGS: No thyroid remnant identified. Physiologic distribution of tracer in the bowel and liver. No abnormal sites of radio iodine accumulation are seen to  suggest iodine avid metastatic thyroid cancer. IMPRESSION: No evidence of thyroid remnant or iodine avid metastatic thyroid cancer. Electronically Signed   By: Ulyses Southward M.D.   On: 04/10/2022 11:21   NM RAI THERAPY W/ THYROGEN THYROID CA  Result Date: 04/03/2022 CLINICAL DATA:  Papillary thyroid carcinoma. Low risk disease. 2 cm carcinoma with extracapsular extension with negative margins. Negative lymph nodes. T2 N0 remnant ablation. EXAM: RADIOACTIVE IODINE THERAPY FOR THYROID CANCER PROCEDURE: The risks and benefits of radioactive iodine therapy were discussed with the patient in detail. Alternative therapies were also mentioned. Radiation safety was discussed with the patient, including how to protect the general public from exposure. There were no barriers to communication. Written consent was obtained. The patient then received a capsule containing the radiopharmaceutical. Thyrogen stimulation protocol The patient will follow-up with the referring physician. RADIOPHARMACEUTICALS:  70.8 mCi I-131 sodium iodide FINDINGS: Thyroid ablation in low risk disease. T 2 N0. See clinical data above. IMPRESSION: Per oral administration of I-131 sodium iodide for thyroid remnant ablation. Electronically Signed  By: Genevive Bi M.D.   On: 04/03/2022 11:12    Impression:  Stage IA (cT1c, cN0, cM0) Left Breast UIQ, Invasive ductal adenocarcinoma, ER+ / PR+ / Her2-, Grade 2 : s/p lumpectomy and adjuvant chemotherapy with TC x 4 cycles   ***  Plan:  Patient is scheduled for CT simulation {date/later today}. ***  -----------------------------------  Billie Lade, PhD, MD  This document serves as a record of services personally performed by Antony Blackbird, MD. It was created on his behalf by Neena Rhymes, a trained medical scribe. The creation of this record is based on the scribe's personal observations and the provider's statements to them. This document has been checked and approved by the attending  provider.

## 2022-04-29 ENCOUNTER — Ambulatory Visit
Admission: RE | Admit: 2022-04-29 | Discharge: 2022-04-29 | Disposition: A | Discharge status: Home / Self Care | Payer: 59 | Source: Ambulatory Visit | Attending: Radiation Oncology | Admitting: Radiation Oncology

## 2022-04-29 ENCOUNTER — Encounter: Payer: Self-pay | Admitting: Radiation Oncology

## 2022-04-29 VITALS — BP 104/70 | HR 96 | Resp 18 | Wt 154.4 lb

## 2022-04-29 DIAGNOSIS — C73 Malignant neoplasm of thyroid gland: Secondary | ICD-10-CM | POA: Insufficient documentation

## 2022-04-29 DIAGNOSIS — Z79890 Hormone replacement therapy (postmenopausal): Secondary | ICD-10-CM | POA: Insufficient documentation

## 2022-04-29 DIAGNOSIS — Z7952 Long term (current) use of systemic steroids: Secondary | ICD-10-CM | POA: Insufficient documentation

## 2022-04-29 DIAGNOSIS — C50212 Malignant neoplasm of upper-inner quadrant of left female breast: Secondary | ICD-10-CM

## 2022-04-29 DIAGNOSIS — Z510 Encounter for antineoplastic radiation therapy: Secondary | ICD-10-CM | POA: Insufficient documentation

## 2022-04-29 DIAGNOSIS — Z170 Estrogen receptor positive status [ER+]: Secondary | ICD-10-CM | POA: Insufficient documentation

## 2022-04-29 DIAGNOSIS — Z79899 Other long term (current) drug therapy: Secondary | ICD-10-CM | POA: Insufficient documentation

## 2022-04-29 NOTE — Addendum Note (Signed)
Encounter addended by: Merrie Roof, RN on: 04/29/2022 1:47 PM  Actions taken: Vitals modified, Clinical Note Signed

## 2022-04-29 NOTE — Progress Notes (Addendum)
Location of Breast Cancer: Malignant neoplasm of upper-inner quadrant of left breast in female, estrogen receptor positive    Histology per Pathology Report:  01-22-21 FINAL MICROSCOPIC DIAGNOSIS:   A. BREAST, LEFT ANTERIOR SUPERIOR MARGIN, RE-EXCISION:  - Negative for carcinoma in situ or invasive carcinoma   B. BREAST, LEFT MEDIAL INFERIOR MARGIN, RE-EXCISION:  - Negative for carcinoma in situ or invasive carcinoma   C. BREAST, LEFT POSTERIOR LATERAL MARGIN, RE-EXCISION:  - Negative for carcinoma in situ or invasive carcinoma   D. SENTINEL LYMPH NODE, LEFT AXILLARY, BIOPSY:  - Negative for carcinoma (0/1)    01-01-22 FINAL MICROSCOPIC DIAGNOSIS:  A. 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 THYROID, TOTAL THYROIDECTOMY: Papillary carcinoma of right lobe, classic and infiltrative follicular subtypes Tumor measures 2.3 x 1.5 x 1.2 cm (pT2) Tumor invades perithyroidal soft tissue Margins free Background multinodular goiter  C. LYMPH NODE, CENTRAL COMPARTMENT ZONE VI, REGIONAL RESECTION: Two benign lymph nodes, negative for carcinoma (0/2, pN0)  ONCOLOGY TABLES  INVASIVE CARCINOMA OF THE BREAST:  Resection  Procedure: Lumpectomy Specimen Laterality: Left Histologic Type: Ductal carcinoma, NOS Histologic Grade:      Glandular (Acinar)/Tubular Differentiation: 3      Nuclear Pleomorphism: 2      Mitotic Rate: 1      Overall Grade: Grade 2 (5/9) Tumor Size: 1.5 x 1.2 x 0.7 cm Ductal Carcinoma In Situ: Ductal  carcinoma in situ, intermediate grade, cribriform type without necrosis focally present Tumor Extent: Limited to breast parenchyma Treatment Effect in the Breast: No known presurgical therapy Margins:      Distance from Closest Margin (mm): Invasive carcinoma present in lateral margin DCIS Margins: Uninvolved by DCIS      Distance from Closest Margin (mm): 0.6 mm from anterior margin Regional Lymph Nodes: Not applicable (no lymph nodes submitted or found) Distant Metastasis: Not applicable Breast Biomarker Testing Performed on Previous Biopsy:      Testing Performed on Case Number: Current case; results to be issued in an addendum to this report            Estrogen Receptor: Pending            Progesterone Receptor: Pending            HER2: Pending            Ki-67: Pending Pathologic Stage Classification (pTNM, AJCC 8th Edition): pT1c, pN n/a Representative Tumor Block: A4, A7 Comment(s): None  (v4.5.0.0)   Receptor Status: ER(90%), PR (100%), Her2-neu (2+), Ki-67(5%) Did patient present with symptoms (if so, please note symptoms) or was this found on screening mammography?: screening mammogram   Past/Anticipated interventions by surgeon, if any: 01-01-22 Procedure: Left breast seed localized lumpectomy  Surgeon: Maisie Fus a Cornett MD   01-01-22 Pre-operative Diagnosis:  Papillary thyroid carcinoma  Post-operative Diagnosis:  same  Surgeon:  Darnell Level, MD  Assistant:  None         Procedure:  Total thyroidectomy with limited central compartment lymph node dissection (Zone VI)   02-11-22 Procedure: Left breast reexcision lumpectomy and left axillary sentinel lymph node mapping with mag trace Surgeon: Harriette Bouillon, MD  02-07-22 Procedure: Portacath Placement with C arm and U/S guidance  Surgeon: Dortha Schwalbe, MD, FACS   Past/Anticipated interventions by medical oncology, if any:  Dr. 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) 0/2 lymph nodes)    01/16/2022 Cancer Staging    Staging form: Breast, AJCC 8th Edition - Clinical: Stage IA (cT1c, cN0, cM0, G2, ER+, PR+, HER2-) - Signed by Serena Croissant, MD on 01/16/2022 Histologic grading system: 3 grade system    01/22/2022 Surgery    Margin reexcision surgery: Benign margins, sentinel lymph node 0/1    01/23/2022 Oncotype testing    Recurrence score: 31 (19% distant recurrence risk at 9 years)    02/11/2022 -  Chemotherapy    Patient is on Treatment Plan : BREAST TC q21d       ASSESSMENT & PLAN:  Malignant neoplasm of upper-inner quadrant of left breast in female, estrogen receptor positive (HCC) 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  On probiotics: Diarrhea appears to  be better chemo-induced anemia: Monitoring closely today's hemoglobin is 10 Lack of taste and appetite with a metallic taste when she eats certain foods: I reassured her that it will recover.  Unclear if the radioactive iodine treatment had any reason to make her lose her taste for salt.     Lymphedema issues, if any:  yes left breast some   Pain issues, if any:  Denies    SAFETY ISSUES: Prior radiation? no Pacemaker/ICD? no Possible current pregnancy?no Is the patient on methotrexate? yes   Current Complaints / other details:           Vitals:   04/29/22 1344  BP: 104/70  Pulse: 96  Resp: 18  SpO2: 99%  Weight: 70 kg

## 2022-04-29 NOTE — Addendum Note (Signed)
Encounter addended by: Merrie Roof, RN on: 04/29/2022 1:44 PM  Actions taken: Clinical Note Signed, Vitals modified

## 2022-04-30 ENCOUNTER — Telehealth: Payer: Self-pay

## 2022-04-30 ENCOUNTER — Encounter: Payer: Self-pay | Admitting: Radiation Oncology

## 2022-04-30 NOTE — Telephone Encounter (Signed)
RN called pt concerning questions on my chart message. Rn instructed pt that it was ok to use Valtrex for upcoming fever blister. Rn also educated pt on cream that would be provided to her during treatment as well. Finally pt assured she could use Miaderm as one week before XRT and two weeks after XRT. Pt had no questions on this conversation and was grateful for the phone call.

## 2022-05-03 ENCOUNTER — Encounter: Payer: Self-pay | Admitting: Gastroenterology

## 2022-05-05 DIAGNOSIS — Z510 Encounter for antineoplastic radiation therapy: Secondary | ICD-10-CM | POA: Diagnosis not present

## 2022-05-06 ENCOUNTER — Encounter: Payer: Self-pay | Admitting: *Deleted

## 2022-05-06 ENCOUNTER — Telehealth: Payer: Self-pay | Admitting: Hematology and Oncology

## 2022-05-06 ENCOUNTER — Ambulatory Visit: Payer: 59 | Attending: Hematology and Oncology

## 2022-05-06 VITALS — Wt 154.2 lb

## 2022-05-06 DIAGNOSIS — C50212 Malignant neoplasm of upper-inner quadrant of left female breast: Secondary | ICD-10-CM

## 2022-05-06 DIAGNOSIS — Z483 Aftercare following surgery for neoplasm: Secondary | ICD-10-CM

## 2022-05-06 NOTE — Telephone Encounter (Signed)
Scheduled appointment per scheduling message. Patient is aware of the made appointment. 

## 2022-05-06 NOTE — Therapy (Signed)
OUTPATIENT PHYSICAL THERAPY SOZO SCREENING NOTE   Patient Name: [name] MRN: [id number] DOB:[birth year], 62 y.o., female Today's Date: 05/06/2022  PCP: Margaree Mackintosh, MD REFERRING PROVIDER: Serena Croissant, MD   PT End of Session - 05/06/22 0900     Visit Number 11   3 unchanged due to screen only   PT Start Time 0858    PT Stop Time 0902    PT Time Calculation (min) 4 min    Activity Tolerance Patient tolerated treatment well    Behavior During Therapy WFL for tasks assessed/performed             Past Medical History:  Diagnosis Date   Anxiety    Allergy induced asthma   Arthritis    Asthma    Breast cancer    Crohn disease    Depression    GERD (gastroesophageal reflux disease)    History of methicillin resistant staphylococcus aureus (MRSA)    pt denies this.   Multiple thyroid nodules    Pneumonia    Sleep apnea    CPAP   Thyroid cancer    Past Surgical History:  Procedure Laterality Date   ANAL FISSURE REPAIR  2004   BREAST LUMPECTOMY WITH RADIOACTIVE SEED LOCALIZATION Left 01/01/2022   Procedure: LEFT BREAST SEED LUMPECTOMY;  Surgeon: Harriette Bouillon, MD;  Location: [facility] OR;  Service: General;  Laterality: Left;   CESAREAN SECTION     COLONOSCOPY     INSERTION OF MESH N/A 07/26/2020   Procedure: INSERTION OF MESH;  Surgeon: Axel Filler, MD;  Location: [facility] OR;  Service: General;  Laterality: N/A;   LIGAMENT REPAIR Right 11/23/2015   Procedure: right index radial collateral LIGAMENT REPAIR;  Surgeon: Betha Loa, MD;  Location: [facility];  Service: Orthopedics;  Laterality: Right;   PORTACATH PLACEMENT Right 02/07/2022   Procedure: INSERTION PORT-A-CATH;  Surgeon: Harriette Bouillon, MD;  Location: [facility] OR;  Service: General;  Laterality: Right;   RE-EXCISION OF BREAST LUMPECTOMY Left 01/22/2022   Procedure: RE-EXCISION OF LEFT BREAST LUMPECTOMY;  Surgeon: Harriette Bouillon, MD;  Location: [facility];  Service: General;   Laterality: Left;   SENTINEL NODE BIOPSY Left 01/22/2022   Procedure: LEFT SENTINEL NODE BIOPSY;  Surgeon: Harriette Bouillon, MD;  Location: [facility];  Service: General;  Laterality: Left;   THYROIDECTOMY N/A 01/01/2022   Procedure: TOTAL THYROIDECTOMY WITH LIMITED LYMPH NODE DISSECTION;  Surgeon: Darnell Level, MD;  Location: [facility];  Service: General;  Laterality: N/A;   UPPER ENDOSCOPY W/ ESOPHAGEAL MANOMETRY  10/2021   VENTRAL HERNIA REPAIR N/A 07/26/2020   Procedure: LAPAROSCOPIC VENTRAL HERNIA REPAIR WITH MESH;  Surgeon: Axel Filler, MD;  Location: [facility] OR;  Service: General;  Laterality: N/A;   Patient Active Problem List   Diagnosis Date Noted   Port-A-Cath in place 03/25/2022   Malignant neoplasm of upper-inner quadrant of left breast in female, estrogen receptor positive 01/09/2022   Postsurgical hypothyroidism 01/04/2022   Multiple thyroid nodules 12/20/2021   Papillary thyroid carcinoma 12/20/2021   Nodule of lower lobe of right lung 10/17/2021   Left upper lobe pulmonary nodule 10/17/2021   Acromioclavicular sprain, left, initial encounter 07/27/2021   Tibialis posterior tendinitis, right 06/15/2020   Piriformis syndrome of left side 06/15/2020   Umbilical hernia 03/21/2020   Lumbar radiculopathy 07/09/2019   Contusion of right hip and thigh 04/20/2019   Loss of transverse plantar arch 02/11/2019   Sprain of iliolumbar ligament 10/29/2018   Piriformis  syndrome of right side 09/30/2018   Nonallopathic lesion of sacral region 09/30/2018   Nonallopathic lesion of lumbosacral region 09/30/2018   Nonallopathic lesion of thoracic region 09/30/2018   Peroneal tendinitis of left lower extremity 02/11/2018   Polyarthralgia 02/11/2018   Patellofemoral syndrome of right knee 02/11/2018   Gastroesophageal reflux disease 01/16/2017   Cough variant asthma 01/16/2017   Noncompliance with medication treatment due to intermittent use of medication 01/16/2017   Allergic  rhinoconjunctivitis 11/08/2015   Sinobronchitis 10/03/2015   OSA (obstructive sleep apnea) 12/14/2014   Crohn's disease of colon 05/24/2014   Depression 02/01/2014   Disorder of intestine 12/17/2010   Anorectal polyp 12/17/2010   Glaucoma 01/14/2010   Dry skin dermatitis 01/15/2008   Crohn's disease 01/14/2005    REFERRING DIAG: left breast cancer at risk for lymphedema  THERAPY DIAG:  Aftercare following surgery for neoplasm  PERTINENT HISTORY: Patient was diagnosed on 01/01/2022 with 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 It measures 1.5 cm and is located in the upper inner quadrant with IDC only being present in lateral margin. It is ER/PR positive and HER2 negative with a Ki-67 of 5%. Thyroidectomy was also performed at the same time and 2 lymph nodes removed (nodes were negative) and all gross disease removed. Thyroid cancer will be treated with radioactive iodine   PRECAUTIONS: left UE Lymphedema risk, None  SUBJECTIVE: Pt returns for her 3 month L-Dex screen.   PAIN:  Are you having pain? No  SOZO SCREENING: Patient was assessed today using the SOZO machine to determine the lymphedema index score. This was compared to her baseline score. It was determined that she is within the recommended range when compared to her baseline and no further action is needed at this time. She will continue SOZO screenings. These are done every 3 months for 2 years post operatively followed by every 6 months for 2 years, and then annually.   L-DEX FLOWSHEETS - 05/06/22 0900       L-DEX LYMPHEDEMA SCREENING   Measurement Type Unilateral    L-DEX MEASUREMENT EXTREMITY Upper Extremity    POSITION  Standing    DOMINANT SIDE Right    At Risk Side Left    BASELINE SCORE (UNILATERAL) 2.8    L-DEX SCORE (UNILATERAL) 0.2    VALUE CHANGE (UNILAT) -2.6               Hermenia Bers, PTA 05/06/2022, 9:01 AM

## 2022-05-09 ENCOUNTER — Other Ambulatory Visit: Payer: Self-pay

## 2022-05-09 ENCOUNTER — Ambulatory Visit
Admission: RE | Admit: 2022-05-09 | Discharge: 2022-05-09 | Disposition: A | Discharge status: Home / Self Care | Payer: 59 | Source: Ambulatory Visit | Attending: Radiation Oncology | Admitting: Radiation Oncology

## 2022-05-09 DIAGNOSIS — C50212 Malignant neoplasm of upper-inner quadrant of left female breast: Secondary | ICD-10-CM

## 2022-05-09 DIAGNOSIS — Z510 Encounter for antineoplastic radiation therapy: Secondary | ICD-10-CM | POA: Diagnosis not present

## 2022-05-09 DIAGNOSIS — Z170 Estrogen receptor positive status [ER+]: Secondary | ICD-10-CM | POA: Diagnosis not present

## 2022-05-09 LAB — RAD ONC ARIA SESSION SUMMARY
Course Elapsed Days: 0
Plan Fractions Treated to Date: 1
Plan Prescribed Dose Per Fraction: 2.67 Gy
Plan Total Fractions Prescribed: 15
Plan Total Prescribed Dose: 40.05 Gy
Reference Point Dosage Given to Date: 2.67 Gy
Reference Point Session Dosage Given: 2.67 Gy
Session Number: 1

## 2022-05-10 ENCOUNTER — Ambulatory Visit
Admission: RE | Admit: 2022-05-10 | Discharge: 2022-05-10 | Disposition: A | Discharge status: Home / Self Care | Payer: 59 | Source: Ambulatory Visit | Attending: Radiation Oncology | Admitting: Radiation Oncology

## 2022-05-10 ENCOUNTER — Other Ambulatory Visit: Payer: Self-pay

## 2022-05-10 DIAGNOSIS — Z510 Encounter for antineoplastic radiation therapy: Secondary | ICD-10-CM | POA: Diagnosis not present

## 2022-05-10 DIAGNOSIS — C50212 Malignant neoplasm of upper-inner quadrant of left female breast: Secondary | ICD-10-CM | POA: Diagnosis not present

## 2022-05-10 DIAGNOSIS — Z170 Estrogen receptor positive status [ER+]: Secondary | ICD-10-CM | POA: Diagnosis not present

## 2022-05-10 LAB — RAD ONC ARIA SESSION SUMMARY
Course Elapsed Days: 1
Plan Fractions Treated to Date: 2
Plan Prescribed Dose Per Fraction: 2.67 Gy
Plan Total Fractions Prescribed: 15
Plan Total Prescribed Dose: 40.05 Gy
Reference Point Dosage Given to Date: 5.34 Gy
Reference Point Session Dosage Given: 2.67 Gy
Session Number: 2

## 2022-05-12 ENCOUNTER — Other Ambulatory Visit (HOSPITAL_COMMUNITY): Payer: Self-pay

## 2022-05-13 ENCOUNTER — Ambulatory Visit
Admission: RE | Admit: 2022-05-13 | Discharge: 2022-05-13 | Disposition: A | Discharge status: Home / Self Care | Payer: 59 | Source: Ambulatory Visit | Attending: Radiation Oncology | Admitting: Radiation Oncology

## 2022-05-13 ENCOUNTER — Other Ambulatory Visit: Payer: Self-pay

## 2022-05-13 DIAGNOSIS — C50212 Malignant neoplasm of upper-inner quadrant of left female breast: Secondary | ICD-10-CM | POA: Diagnosis not present

## 2022-05-13 DIAGNOSIS — Z510 Encounter for antineoplastic radiation therapy: Secondary | ICD-10-CM | POA: Diagnosis not present

## 2022-05-13 DIAGNOSIS — Z170 Estrogen receptor positive status [ER+]: Secondary | ICD-10-CM | POA: Diagnosis not present

## 2022-05-13 LAB — RAD ONC ARIA SESSION SUMMARY
Course Elapsed Days: 4
Plan Fractions Treated to Date: 3
Plan Prescribed Dose Per Fraction: 2.67 Gy
Plan Total Fractions Prescribed: 15
Plan Total Prescribed Dose: 40.05 Gy
Reference Point Dosage Given to Date: 8.01 Gy
Reference Point Session Dosage Given: 2.67 Gy
Session Number: 3

## 2022-05-14 ENCOUNTER — Ambulatory Visit: Payer: 59

## 2022-05-14 ENCOUNTER — Ambulatory Visit
Admission: RE | Admit: 2022-05-14 | Discharge: 2022-05-14 | Disposition: A | Discharge status: Home / Self Care | Payer: 59 | Source: Ambulatory Visit | Attending: Radiation Oncology | Admitting: Radiation Oncology

## 2022-05-14 ENCOUNTER — Other Ambulatory Visit: Payer: Self-pay

## 2022-05-14 DIAGNOSIS — C50212 Malignant neoplasm of upper-inner quadrant of left female breast: Secondary | ICD-10-CM | POA: Diagnosis not present

## 2022-05-14 DIAGNOSIS — Z170 Estrogen receptor positive status [ER+]: Secondary | ICD-10-CM | POA: Diagnosis not present

## 2022-05-14 DIAGNOSIS — Z510 Encounter for antineoplastic radiation therapy: Secondary | ICD-10-CM | POA: Diagnosis not present

## 2022-05-14 LAB — RAD ONC ARIA SESSION SUMMARY
Course Elapsed Days: 5
Plan Fractions Treated to Date: 4
Plan Prescribed Dose Per Fraction: 2.67 Gy
Plan Total Fractions Prescribed: 15
Plan Total Prescribed Dose: 40.05 Gy
Reference Point Dosage Given to Date: 10.68 Gy
Reference Point Session Dosage Given: 2.67 Gy
Session Number: 4

## 2022-05-15 ENCOUNTER — Ambulatory Visit: Payer: 59

## 2022-05-15 ENCOUNTER — Other Ambulatory Visit: Payer: Self-pay

## 2022-05-15 ENCOUNTER — Encounter: Payer: Self-pay | Admitting: Gastroenterology

## 2022-05-15 ENCOUNTER — Ambulatory Visit
Admission: RE | Admit: 2022-05-15 | Discharge: 2022-05-15 | Disposition: A | Discharge status: Home / Self Care | Payer: 59 | Source: Ambulatory Visit | Attending: Radiation Oncology | Admitting: Radiation Oncology

## 2022-05-15 ENCOUNTER — Telehealth: Payer: Self-pay | Admitting: Gastroenterology

## 2022-05-15 ENCOUNTER — Other Ambulatory Visit (HOSPITAL_COMMUNITY): Payer: Self-pay

## 2022-05-15 ENCOUNTER — Ambulatory Visit (INDEPENDENT_AMBULATORY_CARE_PROVIDER_SITE_OTHER): Payer: 59 | Admitting: Gastroenterology

## 2022-05-15 VITALS — BP 110/70 | HR 57 | Ht 62.0 in | Wt 152.0 lb

## 2022-05-15 DIAGNOSIS — Z860100 Personal history of colon polyps, unspecified: Secondary | ICD-10-CM

## 2022-05-15 DIAGNOSIS — Z79899 Other long term (current) drug therapy: Secondary | ICD-10-CM

## 2022-05-15 DIAGNOSIS — C50212 Malignant neoplasm of upper-inner quadrant of left female breast: Secondary | ICD-10-CM

## 2022-05-15 DIAGNOSIS — K200 Eosinophilic esophagitis: Secondary | ICD-10-CM

## 2022-05-15 DIAGNOSIS — Z79811 Long term (current) use of aromatase inhibitors: Secondary | ICD-10-CM | POA: Diagnosis not present

## 2022-05-15 DIAGNOSIS — Z170 Estrogen receptor positive status [ER+]: Secondary | ICD-10-CM | POA: Insufficient documentation

## 2022-05-15 DIAGNOSIS — Z510 Encounter for antineoplastic radiation therapy: Secondary | ICD-10-CM | POA: Diagnosis not present

## 2022-05-15 DIAGNOSIS — K5010 Crohn's disease of large intestine without complications: Secondary | ICD-10-CM | POA: Diagnosis not present

## 2022-05-15 LAB — RAD ONC ARIA SESSION SUMMARY
Course Elapsed Days: 6
Plan Fractions Treated to Date: 5
Plan Prescribed Dose Per Fraction: 2.67 Gy
Plan Total Fractions Prescribed: 15
Plan Total Prescribed Dose: 40.05 Gy
Reference Point Dosage Given to Date: 13.35 Gy
Reference Point Session Dosage Given: 2.67 Gy
Session Number: 5

## 2022-05-15 MED ORDER — HUMIRA (2 SYRINGE) 40 MG/0.4ML ~~LOC~~ PSKT
0.4000 mL | PREFILLED_SYRINGE | SUBCUTANEOUS | 5 refills | Status: DC
  Filled 2022-05-15 – 2022-05-16 (×2): qty 2, 28d supply, fill #0

## 2022-05-15 MED ORDER — ALRA NON-METALLIC DEODORANT (RAD-ONC)
1.0000 | Freq: Once | TOPICAL | Status: AC
  Administered 2022-05-15: 1 via TOPICAL

## 2022-05-15 MED ORDER — RADIAPLEXRX EX GEL: Freq: Once | CUTANEOUS | Status: AC

## 2022-05-15 NOTE — Telephone Encounter (Signed)
Refills of Humira sent to [facility] as instructed by Dr. Adela Lank

## 2022-05-15 NOTE — Progress Notes (Signed)
HPI :  62 year old female with a history of colonic Crohn's disease, GERD, EoE, breast cancer, here for a follow up visit.    Crohn's history:  She has been followed by [facility] for her Crohn's disease for the past several years. Dx with Crohn's disease in 2001.  Index symptoms appear to be abdominal pain, loose stools with blood and mucus.  Over the years she has been on a variety of regimens to include Remicade, thiopurine's, Humira, methotrexate, mesalamine, Flagyl.  She has colonic Crohn's involvement with reported history of perianal abscess without fistula in the past.  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   She has been on and off thiopurine's methotrexate in years past. Currently on Humira every 2 weeks ans has been on this for the past few years and controlling her disease well.  Last colonoscopy was performed in December 2021 showing no endoscopic inflammation, biopsies showed no evidence of microscopic colitis.  Her vaccines for flu, COVID, pneumococcus, Shingrix are all up-to-date.   SINCE LAST VISIT   62 year old female here for follow-up visit today.  She has been in general doing really well since have seen her in regards to the issues we follow her for.  Recall she was diagnosed with breast cancer, had surgery, chemotherapy, and most recently started radiation therapy.  She finished chemotherapy in April 1.  I had her hold Humira while she was on chemotherapy, we discussed timing of when to resume this.  She appears to have tolerated chemotherapy well.  Currently tolerating radiation well.  She has been off Humira for a few months now, denies any problems with her bowels, no abdominal pains.  No blood in her stools.  She is due to start Anostrazole per oncology.  Otherwise recall she has had a history of reflux and dysphagia in the past.  PET scan in the past showed some symmetric distal wall thickening. She underwent an EGD with me in October 2023.  She had mild LA grade a  esophagitis with a GEJ stenosis that was treated with balloon dilation.  Also had some subtle changes in her esophagus concerning for EOE.  As below, pathology was consistent with eosinophilic esophagitis.  She was started on omeprazole 40 mg daily and referred to an allergist.  She had extensive food allergy testing which was negative.  She states post dilation and on omeprazole her symptoms have essentially resolved.    She has continued to feel well over time on omeprazole.  We have since reduced her omeprazole dosing to 20 mg daily.  She again denies any reflux that bothers her at baseline, perhaps slightly if she has a food trigger that will bother it.  No issues with dysphagia since the dilation.  She denies any history of chronic kidney disease or osteoporosis or bone fractures. DEXA scan in 2022 was normal.   Endoscopy history:  Colonoscopy 12/29/19: Skin tags were found on perianal exam. The terminal ileum appeared normal. Normal mucosa was found in the entire colon. Biopsies for histology were  taken with a cold forceps from the right colon and left colon for  evaluation of microscopic colitis. Many small and large-mouthed diverticula were found in the entire colon. Scattered probable pseudopolyps were found in the transverse colon,  ascending colon and cecum. The polyps were 1 to 2 mm in size. These  polyps were removed with a jumbo cold forceps. Resection and retrieval  were complete. Internal hemorrhoids were found during retroflexion.  A: Right colon,  biopsy - Colonic mucosa with focal hyperplastic change - No significant active inflammation - No evidence of dysplasia   B: Colon, random, biopsy - Melanosis coli - No significant active inflammation - No evidence of dysplasia    Colon 11/07/17: One 12 mm polyp in the sigmoid colon, removed with a hot snare. Resected and retrieved. Pseudopolyps at the hepatic flexure. Biopsied. Erythematous mucosa in the sigmoid colon c/w scar.  Biopsied. No active disease seen. Moderate diverticulosis in the sigmoid colon, in the descending colon, in the transverse colon and in the ascending colon. There was no evidence of diverticular bleeding.  A: Colon, transverse, biopsy - Sessile serrated polyp (2 fragments); negative for dysplasia - Lymphoid nodule (1 fragment) - No CMV viral cytopathic effect or granuloma identified   B: Colon, sigmoid, biopsy - Unremarkable colonic mucosa (2 fragments) - No CMV viral cytopathic effect, granuloma, or dysplasia identified  EGD 07/12/15: Normal esophagus, stomach, and duodenum.   Colon 07/12/15: Pseudopolyps in the transverse colon. Resected and retrieved. Diverticulosis in the sigmoid colon, in the descending colon, in the transverse colon and in the ascending colon. Increased mucosa vascular pattern in the rectum. Biopsied.  A: Colon, ascending, transverse, biopsy - Colonic mucosa with mild hyperplastic epithelial changes, negative for dysplasia - No active inflammation, CMV cytopathic effect or granulomas identified   B: Colon, random, left, biopsy - Colonic mucosa with no specific pathologic abnormality, negative for dysplasia - No significant crypt architectural distortion or active inflammation seen - No CMV cytopathic effect or granulomas identified  Colon 12/17/10:  One 12 mm polyp in the rectum. Resected and retrieved. Erythematous mucosa in the sigmoid colon and in the descending colon. The splenic flexure, transverse colon, hepatic flexure, ascending colon, cecum and appendiceal orifice are normal with the exception of a few scattered diverticulae. Two biopsies were taken every 10 cm from the right colon and left colon.   A:  Colon, sigmoid and rectum, biopsy  -  Inflammatory pseudopolyp arising in chronic inflammatory bowel disease with  focal low grade dysplasia (see comment)   B:  Colon, right, biopsy  -  No significant pathologic abnormality  -  No inflammatory bowel disease  or dysplasia identified   C:  Colon, left, biopsy  -  No significant pathologic abnormality  -  No inflammatory bowel disease or dysplasia identified        PET scan 10/29/21: IMPRESSION: 1. Minimally metabolic 17 mm mixed density right lower lobe pulmonary nodule may reflect an infectious or inflammatory etiology, but given its morphology a very low-grade primary bronchogenic adenocarcinoma remains a pertinent differential consideration. Consider referral to multidisciplinary tumor board and dedicated chest CT surveillance imaging or versus direct tissue sampling. 2. Hypermetabolic heterogeneous 19 mm right thyroid nodule, suggest further evaluation with thyroid ultrasound and FNA. 3. Hypermetabolic mild symmetric distal esophageal wall thickening with a max SUV of 4.3, possibly reflecting esophagitis. Consider further evaluation with endoscopy. 4. Hypermetabolic hyperplasia of the tonsils slightly asymmetric to the left, nonspecific but commonly reactive. Consider correlation with direct visualization. 5. Focus of hypermetabolic activity about the left acromioclavicular joint without underlying osseous lesion identified, favored inflammatory. 6. Minimally metabolic right posterior gluteal soft tissue nodularity likely reflects sequela of subcutaneous injections or prior trauma. 7. Colonic diverticulosis without findings of acute diverticulitis. 8. Nodular uterine contour likely reflects leiomyomas.       Fecal calprotectin 11/09/21:    Quantiferon gold 11/02/21 - negative     EGD 11/09/21:  - Esophagogastric landmarks identified. -  LA Grade A reflux esophagitis. - Benign-appearing esophageal stenosis. Dilated to 16mm with good result. - Esophageal mucosal changes suspicious for possible eosinophilic esophagitis vs. reflux changes. - Normal stomach. - Normal examined duodenum.   Surgical [P], esophageal biopsies MORPHOLOGIC FEATURES CONSISTENT WITH EOSINOPHILIC  ESOPHAGITIS. AVERAGE NUMBER OF EOSINOPHILS PER HIGH-POWERED FIELD: MORE THAN 20. NEGATIVE FOR DYSPLASIA AND MALIGNANCY. COMMENT: THE DIFFERENTIAL DIAGNOSIS INCLUDES PPI INDUCED EOSINOPHILIA.   Seen by allergy - negative food testing   Past Medical History:  Diagnosis Date   Anxiety    Allergy induced asthma   Arthritis    Asthma    Breast cancer (HCC)    Crohn disease (HCC)    Depression    GERD (gastroesophageal reflux disease)    History of methicillin resistant staphylococcus aureus (MRSA)    pt denies this.   Multiple thyroid nodules    Pneumonia    Sleep apnea    CPAP   Thyroid cancer [facility])      Past Surgical History:  Procedure Laterality Date   ANAL FISSURE REPAIR  2004   BREAST LUMPECTOMY WITH RADIOACTIVE SEED LOCALIZATION Left 01/01/2022   Procedure: LEFT BREAST SEED LUMPECTOMY;  Surgeon: Harriette Bouillon, MD;  Location: [facility] OR;  Service: General;  Laterality: Left;   CESAREAN SECTION     COLONOSCOPY     INSERTION OF MESH N/A 07/26/2020   Procedure: INSERTION OF MESH;  Surgeon: Axel Filler, MD;  Location: [facility] OR;  Service: General;  Laterality: N/A;   LIGAMENT REPAIR Right 11/23/2015   Procedure: right index radial collateral LIGAMENT REPAIR;  Surgeon: Betha Loa, MD;  Location: [facility];  Service: Orthopedics;  Laterality: Right;   PORTACATH PLACEMENT Right 02/07/2022   Procedure: INSERTION PORT-A-CATH;  Surgeon: Harriette Bouillon, MD;  Location: [facility];  Service: General;  Laterality: Right;   RE-EXCISION OF BREAST LUMPECTOMY Left 01/22/2022   Procedure: RE-EXCISION OF LEFT BREAST LUMPECTOMY;  Surgeon: Harriette Bouillon, MD;  Location: [facility];  Service: General;  Laterality: Left;   SENTINEL NODE BIOPSY Left 01/22/2022   Procedure: LEFT SENTINEL NODE BIOPSY;  Surgeon: Harriette Bouillon, MD;  Location: [facility];  Service: General;  Laterality: Left;   THYROIDECTOMY N/A 01/01/2022   Procedure: TOTAL THYROIDECTOMY WITH  LIMITED LYMPH NODE DISSECTION;  Surgeon: Darnell Level, MD;  Location: [facility];  Service: General;  Laterality: N/A;   UPPER ENDOSCOPY W/ ESOPHAGEAL MANOMETRY  10/2021   VENTRAL HERNIA REPAIR N/A 07/26/2020   Procedure: LAPAROSCOPIC VENTRAL HERNIA REPAIR WITH MESH;  Surgeon: Axel Filler, MD;  Location: [facility];  Service: General;  Laterality: N/A;   Family History  Problem Relation Age of Onset   Alcohol abuse Mother    Alcohol abuse Sister    Angioedema Neg Hx    Allergic rhinitis Neg Hx    Asthma Neg Hx    Atopy Neg Hx    Eczema Neg Hx    Immunodeficiency Neg Hx    Urticaria Neg Hx    Colon cancer Neg Hx    Stomach cancer Neg Hx    Esophageal cancer Neg Hx    Colon polyps Neg Hx    Social History   Tobacco Use   Smoking status: Never    Passive exposure: Never   Smokeless tobacco: Never  Vaping Use   Vaping Use: Never used  Substance Use Topics   Alcohol use: Yes    Alcohol/week: 10.0 standard drinks of alcohol    Types: 10 Glasses of wine  per week    Comment: 10 glasses/week   Drug use: No   Current Outpatient Medications  Medication Sig Dispense Refill   Adalimumab (HUMIRA, 2 SYRINGE,) 40 MG/0.4ML PSKT Inject 0.4 mLs (40 mg total) under the skin every 14 (fourteen) days. 2 each 5   albuterol (VENTOLIN HFA) 108 (90 Base) MCG/ACT inhaler Inhale 2 puffs into the lungs every 6 (six) hours as needed for wheezing or shortness of breath. 8.5 g 3   ascorbic acid (VITAMIN C) 500 MG tablet Take 500 mg by mouth 2 (two) times a week.     bimatoprost (LUMIGAN) 0.01 % SOLN Instill 1 drop into both eyes at bedtime 2.5 mL 10   busPIRone (BUSPAR) 7.5 MG tablet Take 1 tablet (7.5 mg total) by mouth 2 (two) times daily. 180 tablet 2   celecoxib (CELEBREX) 100 MG capsule Take 1 capsule (100 mg total) by mouth in the morning and at bedtime. 60 capsule 11   cetirizine (ZYRTEC) 10 MG tablet Take 10 mg by mouth in the morning.     DULoxetine (CYMBALTA) 60 MG capsule Take 1 capsule (60 mg  total) by mouth daily. 90 capsule 1   fluocinonide ointment (LIDEX) 0.05 % Apply 1 application  topically daily as needed (irritation).     levothyroxine (SYNTHROID) 50 MCG tablet Take 2 tablets (100 mcg total) by mouth daily before breakfast. 180 tablet 3   lidocaine-prilocaine (EMLA) cream Apply to affected area once 30 g 3   omeprazole (PRILOSEC) 20 MG capsule Take 1 capsule (20 mg total) by mouth daily. 90 capsule 3   Vitamin D, Ergocalciferol, (DRISDOL) 1.25 MG (50000 UNIT) CAPS capsule Take 1 capsule (50,000 Units total) by mouth every 7 (seven) days. 12 capsule 0   Current Facility-Administered Medications  Medication Dose Route Frequency Provider Last Rate Last Admin   0.9 %  sodium chloride infusion  500 mL Intravenous Continuous Tyonna Talerico, Willaim Rayas, MD       Allergies  Allergen Reactions   Chlorhexidine Gluconate Itching   Gabapentin Itching   Hydrocodone Itching   Oxycodone Hcl Itching    NDC LKGM:[id number]  NDC QIHK:[id number]   Betadine [Povidone Iodine] Rash     Review of Systems: All systems reviewed and negative except where noted in HPI.   Lab Results  Component Value Date   WBC 8.0 04/15/2022   HGB 10.0 (L) 04/15/2022   HCT 29.6 (L) 04/15/2022   MCV 94.0 04/15/2022   PLT 275 04/15/2022    Lab Results  Component Value Date   CREATININE 0.64 04/15/2022   BUN 16 04/15/2022   NA 139 04/15/2022   K 3.8 04/15/2022   CL 108 04/15/2022   CO2 23 04/15/2022    Lab Results  Component Value Date   ALT 21 04/15/2022   AST 19 04/15/2022   ALKPHOS 53 04/15/2022   BILITOT 0.4 04/15/2022     Physical Exam: BP 110/70   Pulse (!) 57   Ht 5\' 2"  (1.575 m)   Wt 152 lb (68.9 kg)   LMP 01/10/2014   BMI 27.80 kg/m  Constitutional: Pleasant,well-developed, female in no acute distress. Neurological: Alert and oriented to person place and time. Psychiatric: Normal mood and affect. Behavior is normal.   ASSESSMENT: 62 y.o. female here for assessment of the  following  1. Eosinophilic esophagitis   2. Long-term current use of proton pump inhibitor therapy   3. Crohn's disease of colon without complication (HCC)   4. History of colon polyps  5. Malignant neoplasm of upper-inner quadrant of left breast in female, estrogen receptor positive (HCC)    Discussed these issues as outlined as above.  GERD and EOE appear to be well-controlled clinically on omeprazole status post dilation.  She has seen an allergist and had negative food allergy testing.  We have reduced her omeprazole from 40 mg daily to 20 mg daily.  She appears to have tolerated this well.  We discussed long-term risks and benefits of chronic PPI use.  I think benefits outweigh risks for her in regards to controlling EOE especially if no clear known food allergens as we do not know which food is driving the process.  She agrees to continue omeprazole for now and can follow-up as needed for any recurrent dysphagia.  Otherwise reviewed her Crohn's disease with her.  We had held Humira through her course of chemotherapy which she tolerated well without any flares of her Crohn's disease.  Now that chemotherapy has finished about a month ago, I think okay to resume her Humira however will confirm with Dr. Ave Filter her oncologist.  Otherwise, in regards to her longstanding colitis and history of pseudo inflammatory polyps, I think she is due for surveillance colonoscopy, we discussed timing of this in regards to her treatment for breast cancer which is ongoing.  We may pursue this in the next 6 months or so, I would like her back on a stable dosing of Humira prior to colonoscopy to make sure we know how active her disease is on a stable regimen, in light of her missing recent doses.  She agrees.  Otherwise her vaccines are up-to-date.  PLAN: - continue omeprazole 20mg  / day - discussed long term risks / benefits of chronic PPI - will discuss with oncology but I think okay to resume Humira at this time -  with longstanding colitis and history of pseudoinflammatory polyps, in need of surveillance colonoscopy question is timing. Will get her back on Humira and do colonoscopy in next 6 months or so - vaccines UTD  Harlin Rain, MD [facility]

## 2022-05-15 NOTE — Telephone Encounter (Signed)
Patient called returning phone call. She stated that she would like Humira medication sent to the [facility] on [address]. Please advise, thank you.

## 2022-05-16 ENCOUNTER — Other Ambulatory Visit: Payer: Self-pay

## 2022-05-16 ENCOUNTER — Ambulatory Visit: Payer: 59

## 2022-05-16 NOTE — Telephone Encounter (Signed)
-----   Message from Benancio Deeds, MD sent at 05/16/2022  7:46 AM EDT ----- Regarding: FW: mutual patient Jan can you please let this patient okay to resume Humira per normal schedule? Thanks   ----- Message ----- From: Serena Croissant, MD Sent: 05/15/2022   2:45 PM EDT To: Benancio Deeds, MD Subject: RE: mutual patient                             Yes. Please resume. Thanks Vinay ----- Message ----- From: Benancio Deeds, MD Sent: 05/15/2022  12:06 PM EDT To: Serena Croissant, MD Subject: mutual patient                                 Hi Dr. Pamelia Hoit,  We share this mutual patient.  I had held her Humira as she went through chemotherapy for breast cancer.  It appears her last dose was April 1.  Are you okay with me resuming Humira at this point in time?  Thanks  Brett Canales

## 2022-05-16 NOTE — Telephone Encounter (Signed)
Called patient and relayed recommendations.  Patient expressed understanding

## 2022-05-17 ENCOUNTER — Ambulatory Visit: Payer: 59

## 2022-05-20 ENCOUNTER — Other Ambulatory Visit: Payer: Self-pay

## 2022-05-20 ENCOUNTER — Ambulatory Visit
Admission: RE | Admit: 2022-05-20 | Discharge: 2022-05-20 | Disposition: A | Discharge status: Home / Self Care | Payer: 59 | Source: Ambulatory Visit | Attending: Radiation Oncology | Admitting: Radiation Oncology

## 2022-05-20 DIAGNOSIS — Z79811 Long term (current) use of aromatase inhibitors: Secondary | ICD-10-CM | POA: Diagnosis not present

## 2022-05-20 DIAGNOSIS — Z170 Estrogen receptor positive status [ER+]: Secondary | ICD-10-CM | POA: Diagnosis not present

## 2022-05-20 DIAGNOSIS — C50212 Malignant neoplasm of upper-inner quadrant of left female breast: Secondary | ICD-10-CM | POA: Diagnosis not present

## 2022-05-20 DIAGNOSIS — Z510 Encounter for antineoplastic radiation therapy: Secondary | ICD-10-CM | POA: Diagnosis not present

## 2022-05-20 LAB — RAD ONC ARIA SESSION SUMMARY
Course Elapsed Days: 11
Plan Fractions Treated to Date: 6
Plan Prescribed Dose Per Fraction: 2.67 Gy
Plan Total Fractions Prescribed: 15
Plan Total Prescribed Dose: 40.05 Gy
Reference Point Dosage Given to Date: 16.02 Gy
Reference Point Session Dosage Given: 2.67 Gy
Session Number: 6

## 2022-05-21 ENCOUNTER — Other Ambulatory Visit: Payer: Self-pay

## 2022-05-21 ENCOUNTER — Ambulatory Visit
Admission: RE | Admit: 2022-05-21 | Discharge: 2022-05-21 | Disposition: A | Discharge status: Home / Self Care | Payer: 59 | Source: Ambulatory Visit | Attending: Radiation Oncology | Admitting: Radiation Oncology

## 2022-05-21 DIAGNOSIS — Z79811 Long term (current) use of aromatase inhibitors: Secondary | ICD-10-CM | POA: Diagnosis not present

## 2022-05-21 DIAGNOSIS — Z510 Encounter for antineoplastic radiation therapy: Secondary | ICD-10-CM | POA: Diagnosis not present

## 2022-05-21 DIAGNOSIS — C50912 Malignant neoplasm of unspecified site of left female breast: Secondary | ICD-10-CM | POA: Diagnosis not present

## 2022-05-21 DIAGNOSIS — Z170 Estrogen receptor positive status [ER+]: Secondary | ICD-10-CM | POA: Diagnosis not present

## 2022-05-21 DIAGNOSIS — C50212 Malignant neoplasm of upper-inner quadrant of left female breast: Secondary | ICD-10-CM | POA: Diagnosis not present

## 2022-05-21 LAB — RAD ONC ARIA SESSION SUMMARY
Course Elapsed Days: 12
Plan Fractions Treated to Date: 7
Plan Prescribed Dose Per Fraction: 2.67 Gy
Plan Total Fractions Prescribed: 15
Plan Total Prescribed Dose: 40.05 Gy
Reference Point Dosage Given to Date: 18.69 Gy
Reference Point Session Dosage Given: 2.67 Gy
Session Number: 7

## 2022-05-22 ENCOUNTER — Other Ambulatory Visit: Payer: Self-pay | Admitting: Pharmacist

## 2022-05-22 ENCOUNTER — Other Ambulatory Visit (HOSPITAL_COMMUNITY): Payer: Self-pay

## 2022-05-22 ENCOUNTER — Other Ambulatory Visit: Payer: Self-pay

## 2022-05-22 ENCOUNTER — Ambulatory Visit
Admission: RE | Admit: 2022-05-22 | Discharge: 2022-05-22 | Disposition: A | Discharge status: Home / Self Care | Payer: 59 | Source: Ambulatory Visit | Attending: Radiation Oncology | Admitting: Radiation Oncology

## 2022-05-22 DIAGNOSIS — Z79811 Long term (current) use of aromatase inhibitors: Secondary | ICD-10-CM | POA: Diagnosis not present

## 2022-05-22 DIAGNOSIS — Z510 Encounter for antineoplastic radiation therapy: Secondary | ICD-10-CM | POA: Diagnosis not present

## 2022-05-22 DIAGNOSIS — C50212 Malignant neoplasm of upper-inner quadrant of left female breast: Secondary | ICD-10-CM | POA: Diagnosis not present

## 2022-05-22 DIAGNOSIS — Z170 Estrogen receptor positive status [ER+]: Secondary | ICD-10-CM | POA: Diagnosis not present

## 2022-05-22 LAB — RAD ONC ARIA SESSION SUMMARY
Course Elapsed Days: 13
Plan Fractions Treated to Date: 8
Plan Prescribed Dose Per Fraction: 2.67 Gy
Plan Total Fractions Prescribed: 15
Plan Total Prescribed Dose: 40.05 Gy
Reference Point Dosage Given to Date: 21.36 Gy
Reference Point Session Dosage Given: 2.67 Gy
Session Number: 8

## 2022-05-22 MED ORDER — HUMIRA (2 SYRINGE) 40 MG/0.4ML ~~LOC~~ PSKT
0.4000 mL | PREFILLED_SYRINGE | SUBCUTANEOUS | 5 refills | Status: DC
  Filled 2022-05-22 – 2022-06-28 (×2): qty 2, 28d supply, fill #0
  Filled 2022-07-29: qty 2, 28d supply, fill #1
  Filled 2022-08-29: qty 2, 28d supply, fill #2
  Filled 2022-09-25: qty 2, 28d supply, fill #3
  Filled 2022-10-28 – 2022-10-30 (×2): qty 2, 28d supply, fill #4
  Filled 2022-11-29: qty 2, 28d supply, fill #5

## 2022-05-23 ENCOUNTER — Other Ambulatory Visit (HOSPITAL_COMMUNITY): Payer: Self-pay

## 2022-05-23 ENCOUNTER — Ambulatory Visit: Payer: 59

## 2022-05-23 DIAGNOSIS — C50912 Malignant neoplasm of unspecified site of left female breast: Secondary | ICD-10-CM | POA: Diagnosis not present

## 2022-05-24 ENCOUNTER — Other Ambulatory Visit: Payer: Self-pay

## 2022-05-24 ENCOUNTER — Ambulatory Visit
Admission: RE | Admit: 2022-05-24 | Discharge: 2022-05-24 | Disposition: A | Discharge status: Home / Self Care | Payer: 59 | Source: Ambulatory Visit | Attending: Radiation Oncology | Admitting: Radiation Oncology

## 2022-05-24 DIAGNOSIS — Z79811 Long term (current) use of aromatase inhibitors: Secondary | ICD-10-CM | POA: Diagnosis not present

## 2022-05-24 DIAGNOSIS — C50212 Malignant neoplasm of upper-inner quadrant of left female breast: Secondary | ICD-10-CM | POA: Diagnosis not present

## 2022-05-24 DIAGNOSIS — Z170 Estrogen receptor positive status [ER+]: Secondary | ICD-10-CM | POA: Diagnosis not present

## 2022-05-24 DIAGNOSIS — Z510 Encounter for antineoplastic radiation therapy: Secondary | ICD-10-CM | POA: Diagnosis not present

## 2022-05-24 LAB — RAD ONC ARIA SESSION SUMMARY
Course Elapsed Days: 15
Plan Fractions Treated to Date: 9
Plan Prescribed Dose Per Fraction: 2.67 Gy
Plan Total Fractions Prescribed: 15
Plan Total Prescribed Dose: 40.05 Gy
Reference Point Dosage Given to Date: 24.03 Gy
Reference Point Session Dosage Given: 2.67 Gy
Session Number: 9

## 2022-05-27 ENCOUNTER — Ambulatory Visit
Admission: RE | Admit: 2022-05-27 | Discharge: 2022-05-27 | Disposition: A | Discharge status: Home / Self Care | Payer: 59 | Source: Ambulatory Visit | Attending: Radiation Oncology | Admitting: Radiation Oncology

## 2022-05-27 ENCOUNTER — Other Ambulatory Visit: Payer: Self-pay

## 2022-05-27 DIAGNOSIS — C50212 Malignant neoplasm of upper-inner quadrant of left female breast: Secondary | ICD-10-CM | POA: Diagnosis not present

## 2022-05-27 DIAGNOSIS — Z510 Encounter for antineoplastic radiation therapy: Secondary | ICD-10-CM | POA: Diagnosis not present

## 2022-05-27 DIAGNOSIS — Z170 Estrogen receptor positive status [ER+]: Secondary | ICD-10-CM | POA: Diagnosis not present

## 2022-05-27 DIAGNOSIS — Z79811 Long term (current) use of aromatase inhibitors: Secondary | ICD-10-CM | POA: Diagnosis not present

## 2022-05-27 LAB — RAD ONC ARIA SESSION SUMMARY
Course Elapsed Days: 18
Plan Fractions Treated to Date: 10
Plan Prescribed Dose Per Fraction: 2.67 Gy
Plan Total Fractions Prescribed: 15
Plan Total Prescribed Dose: 40.05 Gy
Reference Point Dosage Given to Date: 26.7 Gy
Reference Point Session Dosage Given: 2.67 Gy
Session Number: 10

## 2022-05-28 ENCOUNTER — Other Ambulatory Visit: Payer: Self-pay

## 2022-05-28 ENCOUNTER — Ambulatory Visit
Admission: RE | Admit: 2022-05-28 | Discharge: 2022-05-28 | Disposition: A | Discharge status: Home / Self Care | Payer: 59 | Source: Ambulatory Visit | Attending: Radiation Oncology | Admitting: Radiation Oncology

## 2022-05-28 ENCOUNTER — Ambulatory Visit: Payer: 59

## 2022-05-28 DIAGNOSIS — Z79811 Long term (current) use of aromatase inhibitors: Secondary | ICD-10-CM | POA: Diagnosis not present

## 2022-05-28 DIAGNOSIS — C50212 Malignant neoplasm of upper-inner quadrant of left female breast: Secondary | ICD-10-CM | POA: Diagnosis not present

## 2022-05-28 DIAGNOSIS — Z510 Encounter for antineoplastic radiation therapy: Secondary | ICD-10-CM | POA: Diagnosis not present

## 2022-05-28 DIAGNOSIS — Z170 Estrogen receptor positive status [ER+]: Secondary | ICD-10-CM | POA: Diagnosis not present

## 2022-05-28 LAB — RAD ONC ARIA SESSION SUMMARY
Course Elapsed Days: 19
Plan Fractions Treated to Date: 11
Plan Prescribed Dose Per Fraction: 2.67 Gy
Plan Total Fractions Prescribed: 15
Plan Total Prescribed Dose: 40.05 Gy
Reference Point Dosage Given to Date: 29.37 Gy
Reference Point Session Dosage Given: 2.67 Gy
Session Number: 11

## 2022-05-29 ENCOUNTER — Other Ambulatory Visit: Payer: Self-pay

## 2022-05-29 ENCOUNTER — Other Ambulatory Visit: Payer: Self-pay | Admitting: Internal Medicine

## 2022-05-29 ENCOUNTER — Ambulatory Visit
Admission: RE | Admit: 2022-05-29 | Discharge: 2022-05-29 | Disposition: A | Discharge status: Home / Self Care | Payer: 59 | Source: Ambulatory Visit | Attending: Radiation Oncology | Admitting: Radiation Oncology

## 2022-05-29 DIAGNOSIS — Z510 Encounter for antineoplastic radiation therapy: Secondary | ICD-10-CM | POA: Diagnosis not present

## 2022-05-29 DIAGNOSIS — C50212 Malignant neoplasm of upper-inner quadrant of left female breast: Secondary | ICD-10-CM | POA: Diagnosis not present

## 2022-05-29 DIAGNOSIS — Z79811 Long term (current) use of aromatase inhibitors: Secondary | ICD-10-CM | POA: Diagnosis not present

## 2022-05-29 DIAGNOSIS — Z170 Estrogen receptor positive status [ER+]: Secondary | ICD-10-CM | POA: Diagnosis not present

## 2022-05-29 LAB — RAD ONC ARIA SESSION SUMMARY
Course Elapsed Days: 20
Plan Fractions Treated to Date: 12
Plan Prescribed Dose Per Fraction: 2.67 Gy
Plan Total Fractions Prescribed: 15
Plan Total Prescribed Dose: 40.05 Gy
Reference Point Dosage Given to Date: 32.04 Gy
Reference Point Session Dosage Given: 2.67 Gy
Session Number: 12

## 2022-05-30 ENCOUNTER — Other Ambulatory Visit: Payer: Self-pay

## 2022-05-30 ENCOUNTER — Other Ambulatory Visit (HOSPITAL_COMMUNITY): Payer: Self-pay

## 2022-05-30 ENCOUNTER — Ambulatory Visit
Admission: RE | Admit: 2022-05-30 | Discharge: 2022-05-30 | Disposition: A | Discharge status: Home / Self Care | Payer: 59 | Source: Ambulatory Visit | Attending: Radiation Oncology | Admitting: Radiation Oncology

## 2022-05-30 DIAGNOSIS — Z510 Encounter for antineoplastic radiation therapy: Secondary | ICD-10-CM | POA: Diagnosis not present

## 2022-05-30 DIAGNOSIS — Z170 Estrogen receptor positive status [ER+]: Secondary | ICD-10-CM | POA: Diagnosis not present

## 2022-05-30 DIAGNOSIS — C50212 Malignant neoplasm of upper-inner quadrant of left female breast: Secondary | ICD-10-CM | POA: Diagnosis not present

## 2022-05-30 DIAGNOSIS — Z79811 Long term (current) use of aromatase inhibitors: Secondary | ICD-10-CM | POA: Diagnosis not present

## 2022-05-30 LAB — RAD ONC ARIA SESSION SUMMARY
Course Elapsed Days: 21
Plan Fractions Treated to Date: 13
Plan Prescribed Dose Per Fraction: 2.67 Gy
Plan Total Fractions Prescribed: 15
Plan Total Prescribed Dose: 40.05 Gy
Reference Point Dosage Given to Date: 34.71 Gy
Reference Point Session Dosage Given: 2.67 Gy
Session Number: 13

## 2022-05-30 MED ORDER — DULOXETINE HCL 60 MG PO CPEP
60.0000 mg | ORAL_CAPSULE | Freq: Every day | ORAL | 1 refills | Status: DC
  Filled 2022-05-30: qty 90, 90d supply, fill #0
  Filled 2022-08-30: qty 90, 90d supply, fill #1

## 2022-05-31 ENCOUNTER — Other Ambulatory Visit: Payer: Self-pay

## 2022-05-31 ENCOUNTER — Ambulatory Visit
Admission: RE | Admit: 2022-05-31 | Discharge: 2022-05-31 | Disposition: A | Discharge status: Home / Self Care | Payer: 59 | Source: Ambulatory Visit | Attending: Radiation Oncology | Admitting: Radiation Oncology

## 2022-05-31 DIAGNOSIS — Z170 Estrogen receptor positive status [ER+]: Secondary | ICD-10-CM | POA: Diagnosis not present

## 2022-05-31 DIAGNOSIS — Z79811 Long term (current) use of aromatase inhibitors: Secondary | ICD-10-CM | POA: Diagnosis not present

## 2022-05-31 DIAGNOSIS — C50212 Malignant neoplasm of upper-inner quadrant of left female breast: Secondary | ICD-10-CM | POA: Diagnosis not present

## 2022-05-31 DIAGNOSIS — Z510 Encounter for antineoplastic radiation therapy: Secondary | ICD-10-CM | POA: Diagnosis not present

## 2022-05-31 LAB — RAD ONC ARIA SESSION SUMMARY
Course Elapsed Days: 22
Plan Fractions Treated to Date: 14
Plan Prescribed Dose Per Fraction: 2.67 Gy
Plan Total Fractions Prescribed: 15
Plan Total Prescribed Dose: 40.05 Gy
Reference Point Dosage Given to Date: 37.38 Gy
Reference Point Session Dosage Given: 2.67 Gy
Session Number: 14

## 2022-06-03 ENCOUNTER — Ambulatory Visit: Payer: 59

## 2022-06-03 ENCOUNTER — Other Ambulatory Visit: Payer: Self-pay

## 2022-06-03 DIAGNOSIS — C50212 Malignant neoplasm of upper-inner quadrant of left female breast: Secondary | ICD-10-CM | POA: Diagnosis not present

## 2022-06-03 DIAGNOSIS — Z79811 Long term (current) use of aromatase inhibitors: Secondary | ICD-10-CM | POA: Diagnosis not present

## 2022-06-03 DIAGNOSIS — Z170 Estrogen receptor positive status [ER+]: Secondary | ICD-10-CM | POA: Diagnosis not present

## 2022-06-03 DIAGNOSIS — Z510 Encounter for antineoplastic radiation therapy: Secondary | ICD-10-CM | POA: Diagnosis not present

## 2022-06-03 LAB — RAD ONC ARIA SESSION SUMMARY
Course Elapsed Days: 25
Plan Fractions Treated to Date: 15
Plan Prescribed Dose Per Fraction: 2.67 Gy
Plan Total Fractions Prescribed: 15
Plan Total Prescribed Dose: 40.05 Gy
Reference Point Dosage Given to Date: 40.05 Gy
Reference Point Session Dosage Given: 2.67 Gy
Session Number: 15

## 2022-06-04 ENCOUNTER — Ambulatory Visit (HOSPITAL_BASED_OUTPATIENT_CLINIC_OR_DEPARTMENT_OTHER)
Admission: RE | Admit: 2022-06-04 | Discharge: 2022-06-04 | Disposition: A | Discharge status: Home / Self Care | Payer: 59 | Source: Ambulatory Visit | Attending: Acute Care | Admitting: Acute Care

## 2022-06-04 ENCOUNTER — Ambulatory Visit
Admission: RE | Admit: 2022-06-04 | Discharge: 2022-06-04 | Disposition: A | Discharge status: Home / Self Care | Payer: 59 | Source: Ambulatory Visit | Attending: Radiation Oncology | Admitting: Radiation Oncology

## 2022-06-04 ENCOUNTER — Inpatient Hospital Stay: Payer: 59 | Attending: Hematology and Oncology | Admitting: Hematology and Oncology

## 2022-06-04 ENCOUNTER — Ambulatory Visit: Payer: 59

## 2022-06-04 ENCOUNTER — Other Ambulatory Visit: Payer: Self-pay

## 2022-06-04 ENCOUNTER — Other Ambulatory Visit (HOSPITAL_COMMUNITY): Payer: Self-pay

## 2022-06-04 VITALS — BP 118/66 | HR 89 | Temp 98.1°F | Resp 16 | Wt 149.3 lb

## 2022-06-04 DIAGNOSIS — Z79811 Long term (current) use of aromatase inhibitors: Secondary | ICD-10-CM | POA: Insufficient documentation

## 2022-06-04 DIAGNOSIS — R911 Solitary pulmonary nodule: Secondary | ICD-10-CM | POA: Insufficient documentation

## 2022-06-04 DIAGNOSIS — Z170 Estrogen receptor positive status [ER+]: Secondary | ICD-10-CM

## 2022-06-04 DIAGNOSIS — Z510 Encounter for antineoplastic radiation therapy: Secondary | ICD-10-CM | POA: Insufficient documentation

## 2022-06-04 DIAGNOSIS — C50212 Malignant neoplasm of upper-inner quadrant of left female breast: Secondary | ICD-10-CM

## 2022-06-04 LAB — RAD ONC ARIA SESSION SUMMARY
Course Elapsed Days: 26
Plan Fractions Treated to Date: 1
Plan Prescribed Dose Per Fraction: 2 Gy
Plan Total Fractions Prescribed: 5
Plan Total Prescribed Dose: 10 Gy
Reference Point Dosage Given to Date: 2 Gy
Reference Point Session Dosage Given: 2 Gy
Session Number: 16

## 2022-06-04 MED ORDER — ANASTROZOLE 1 MG PO TABS
1.0000 mg | ORAL_TABLET | Freq: Every day | ORAL | 3 refills | Status: DC
  Filled 2022-06-04: qty 90, 90d supply, fill #0
  Filled 2022-08-30: qty 90, 90d supply, fill #1
  Filled 2022-11-28: qty 90, 90d supply, fill #2
  Filled 2023-03-03: qty 90, 90d supply, fill #3

## 2022-06-04 NOTE — Progress Notes (Signed)
Patient Care Team: Margaree Mackintosh, MD as PCP - General (Internal Medicine) Pershing Proud, RN as Oncology Nurse Navigator Donnelly Angelica, RN as Oncology Nurse Navigator Serena Croissant, MD as Consulting Physician (Hematology and Oncology) Serena Croissant, MD as Consulting Physician (Hematology and Oncology)  DIAGNOSIS:  Encounter Diagnosis  Name Primary?   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) 0/2 lymph nodes)   01/16/2022 Cancer Staging   Staging form: Breast, AJCC 8th Edition - Clinical: Stage IA (cT1c, cN0, cM0, G2, ER+, PR+, HER2-) - Signed by Serena Croissant, MD on 01/16/2022 Histologic grading system: 3 grade system   01/22/2022 Surgery   Margin reexcision surgery: Benign margins, sentinel lymph node 0/1   01/23/2022 Oncotype testing   Recurrence score: 31 (19% distant recurrence risk at 9 years)   02/11/2022 -  Chemotherapy   Patient is on Treatment Plan : BREAST TC q21d     05/10/2022 - 06/07/2022 Radiation Therapy   Adjuvant radiation     CHIEF COMPLIANT: Follow-up after radiation  INTERVAL HISTORY: [name] is a 62 year old with above-mentioned history of estrogen receptor positive breast cancer. She presents to the clinic for a follow-up. She reports that she is doing ok. She complains of more fatigue. She says the bottom of her feet likes she walking on hot pavement. Her heart rate is still high. She has moderate hot flashes that wakes  her up at night.   ALLERGIES:  is allergic to chlorhexidine gluconate, gabapentin, hydrocodone, oxycodone hcl, and betadine [povidone iodine].  MEDICATIONS:  Current Outpatient Medications  Medication Sig Dispense Refill   Adalimumab (HUMIRA, 2 SYRINGE,) 40 MG/0.4ML PSKT Inject 0.4 mLs (40 mg total) under the skin every 14 (fourteen) days. 2 each 5   albuterol (VENTOLIN HFA) 108 (90 Base) MCG/ACT inhaler Inhale 2 puffs into the lungs every 6 (six) hours as needed for wheezing or shortness of breath. 8.5 g 3   anastrozole (ARIMIDEX) 1 MG tablet Take 1 tablet (1 mg total) by mouth daily. 90 tablet 3   ascorbic acid (VITAMIN C) 500 MG tablet Take 500 mg by mouth 2 (two) times a week.     bimatoprost (LUMIGAN) 0.01 % SOLN Instill 1 drop into both eyes at bedtime 2.5 mL 10   busPIRone (BUSPAR) 7.5 MG tablet Take 1 tablet (7.5 mg total) by mouth 2 (two) times daily. 180 tablet 2   celecoxib (CELEBREX) 100 MG capsule Take 1 capsule (100 mg total) by mouth in the morning and at bedtime. 60 capsule 11   cetirizine (ZYRTEC) 10 MG tablet Take 10 mg by mouth in the morning.     DULoxetine (CYMBALTA) 60 MG capsule Take 1 capsule (60 mg total) by mouth daily. 90 capsule 1   fluocinonide ointment (LIDEX) 0.05 % Apply 1 application  topically daily as needed (irritation).     levothyroxine (SYNTHROID) 50 MCG tablet Take 2 tablets (100 mcg total) by mouth daily before breakfast. 180 tablet 3   lidocaine-prilocaine (EMLA) cream Apply to affected  area once 30 g 3   omeprazole (PRILOSEC) 20 MG capsule Take 1 capsule (20 mg total) by mouth daily. 90 capsule 3   Vitamin D, Ergocalciferol, (DRISDOL) 1.25 MG (50000 UNIT) CAPS capsule Take 1 capsule (50,000 Units total) by mouth every 7 (seven) days. 12 capsule 0   Current Facility-Administered Medications  Medication Dose Route Frequency Provider Last Rate Last Admin   0.9 %  sodium chloride infusion  500 mL Intravenous Continuous Armbruster, Willaim Rayas, MD         PHYSICAL EXAMINATION: ECOG PERFORMANCE STATUS: 1 - Symptomatic but completely ambulatory  Vitals:   06/04/22 1354  BP: 118/66  Pulse: 89  Resp: 16  Temp: 98.1 F (36.7 C)  SpO2: 99%   Filed Weights   06/04/22 1354  Weight: 149 lb 4.8 oz (67.7 kg)      LABORATORY DATA:  I have reviewed the data as listed    Latest Ref Rng & Units 04/15/2022    8:27 AM 03/25/2022    9:25 AM 03/04/2022    9:25 AM  CMP  Glucose 70 - 99 mg/dL 96  90  540   BUN 8 - 23 mg/dL 16  17  13    Creatinine 0.44 - 1.00 mg/dL 9.81  1.91  4.78   Sodium 135 - 145 mmol/L 139  138  138   Potassium 3.5 - 5.1 mmol/L 3.8  3.7  3.9   Chloride 98 - 111 mmol/L 108  105  104   CO2 22 - 32 mmol/L 23  26  27    Calcium 8.9 - 10.3 mg/dL 9.5  9.5  9.3   Total Protein 6.5 - 8.1 g/dL 6.8  6.9  7.3   Total Bilirubin 0.3 - 1.2 mg/dL 0.4  0.5  0.3   Alkaline Phos 38 - 126 U/L 53  55  74   AST 15 - 41 U/L 19  18  29    ALT 0 - 44 U/L 21  22  37     Lab Results  Component Value Date   WBC 8.0 04/15/2022   HGB 10.0 (L) 04/15/2022   HCT 29.6 (L) 04/15/2022   MCV 94.0 04/15/2022   PLT 275 04/15/2022   NEUTROABS 5.8 04/15/2022    ASSESSMENT & PLAN:  Malignant neoplasm of upper-inner quadrant of left breast in female, estrogen receptor positive (HCC) 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 These include but not limited to insomnia, hot flashes, mood changes, vaginal dryness, bone density loss, and weight gain. We strongly believe that the benefits far outweigh the risks. Patient understands these risks and consented to starting treatment. Planned treatment duration is 7 years.   She already has hot flashes so we discussed different measures to reduce hot flashes.  She will think about it and will inform us Return  to clinic in 3 months for survivorship care plan visit    No orders of the defined types were placed in this encounter.  The patient has a good understanding of the overall plan. she agrees with it. she will call with any problems that may develop before the next visit here. Total time spent: 30 mins including face to face time and time spent for planning, charting and co-ordination of care   Tamsen Meek, MD 06/04/22    I Janan Ridge am acting as a Neurosurgeon for [employer]  I have reviewed the above documentation for accuracy and completeness, and I agree with the above.

## 2022-06-04 NOTE — Assessment & Plan Note (Addendum)
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 These include but not limited to insomnia, hot flashes, mood changes, vaginal dryness, bone density loss, and weight gain. We strongly believe that the benefits far outweigh the risks. Patient understands these risks and consented to starting treatment. Planned treatment duration is 7 years.   She already has hot flashes so we discussed different measures to reduce hot flashes.  She will think about it and will inform us Return to clinic in 3 months for survivorship care plan visit

## 2022-06-05 ENCOUNTER — Ambulatory Visit
Admission: RE | Admit: 2022-06-05 | Discharge: 2022-06-05 | Disposition: A | Discharge status: Home / Self Care | Payer: 59 | Source: Ambulatory Visit | Attending: Radiation Oncology | Admitting: Radiation Oncology

## 2022-06-05 ENCOUNTER — Other Ambulatory Visit: Payer: Self-pay

## 2022-06-05 DIAGNOSIS — Z79811 Long term (current) use of aromatase inhibitors: Secondary | ICD-10-CM | POA: Diagnosis not present

## 2022-06-05 DIAGNOSIS — C50212 Malignant neoplasm of upper-inner quadrant of left female breast: Secondary | ICD-10-CM | POA: Diagnosis not present

## 2022-06-05 DIAGNOSIS — Z170 Estrogen receptor positive status [ER+]: Secondary | ICD-10-CM | POA: Diagnosis not present

## 2022-06-05 DIAGNOSIS — Z510 Encounter for antineoplastic radiation therapy: Secondary | ICD-10-CM | POA: Diagnosis not present

## 2022-06-05 LAB — RAD ONC ARIA SESSION SUMMARY
Course Elapsed Days: 27
Plan Fractions Treated to Date: 2
Plan Prescribed Dose Per Fraction: 2 Gy
Plan Total Fractions Prescribed: 5
Plan Total Prescribed Dose: 10 Gy
Reference Point Dosage Given to Date: 4 Gy
Reference Point Session Dosage Given: 2 Gy
Session Number: 17

## 2022-06-06 ENCOUNTER — Ambulatory Visit
Admission: RE | Admit: 2022-06-06 | Discharge: 2022-06-06 | Disposition: A | Discharge status: Home / Self Care | Payer: 59 | Source: Ambulatory Visit | Attending: Radiation Oncology | Admitting: Radiation Oncology

## 2022-06-06 ENCOUNTER — Other Ambulatory Visit: Payer: Self-pay

## 2022-06-06 DIAGNOSIS — Z79811 Long term (current) use of aromatase inhibitors: Secondary | ICD-10-CM | POA: Diagnosis not present

## 2022-06-06 DIAGNOSIS — Z510 Encounter for antineoplastic radiation therapy: Secondary | ICD-10-CM | POA: Diagnosis not present

## 2022-06-06 DIAGNOSIS — Z170 Estrogen receptor positive status [ER+]: Secondary | ICD-10-CM | POA: Diagnosis not present

## 2022-06-06 DIAGNOSIS — C50212 Malignant neoplasm of upper-inner quadrant of left female breast: Secondary | ICD-10-CM | POA: Diagnosis not present

## 2022-06-06 LAB — RAD ONC ARIA SESSION SUMMARY
Course Elapsed Days: 28
Plan Fractions Treated to Date: 3
Plan Prescribed Dose Per Fraction: 2 Gy
Plan Total Fractions Prescribed: 5
Plan Total Prescribed Dose: 10 Gy
Reference Point Dosage Given to Date: 6 Gy
Reference Point Session Dosage Given: 2 Gy
Session Number: 18

## 2022-06-07 ENCOUNTER — Ambulatory Visit: Payer: 59

## 2022-06-07 ENCOUNTER — Other Ambulatory Visit: Payer: Self-pay

## 2022-06-07 ENCOUNTER — Ambulatory Visit
Admission: RE | Admit: 2022-06-07 | Discharge: 2022-06-07 | Disposition: A | Discharge status: Home / Self Care | Payer: 59 | Source: Ambulatory Visit | Attending: Radiation Oncology | Admitting: Radiation Oncology

## 2022-06-07 DIAGNOSIS — Z510 Encounter for antineoplastic radiation therapy: Secondary | ICD-10-CM | POA: Diagnosis not present

## 2022-06-07 DIAGNOSIS — C50212 Malignant neoplasm of upper-inner quadrant of left female breast: Secondary | ICD-10-CM | POA: Diagnosis not present

## 2022-06-07 DIAGNOSIS — Z79811 Long term (current) use of aromatase inhibitors: Secondary | ICD-10-CM | POA: Diagnosis not present

## 2022-06-07 DIAGNOSIS — Z170 Estrogen receptor positive status [ER+]: Secondary | ICD-10-CM | POA: Diagnosis not present

## 2022-06-07 LAB — RAD ONC ARIA SESSION SUMMARY
Course Elapsed Days: 29
Plan Fractions Treated to Date: 4
Plan Prescribed Dose Per Fraction: 2 Gy
Plan Total Fractions Prescribed: 5
Plan Total Prescribed Dose: 10 Gy
Reference Point Dosage Given to Date: 8 Gy
Reference Point Session Dosage Given: 2 Gy
Session Number: 19

## 2022-06-11 ENCOUNTER — Encounter: Payer: Self-pay | Admitting: Acute Care

## 2022-06-11 ENCOUNTER — Other Ambulatory Visit (HOSPITAL_COMMUNITY): Payer: Self-pay

## 2022-06-11 ENCOUNTER — Other Ambulatory Visit: Payer: Self-pay

## 2022-06-11 ENCOUNTER — Ambulatory Visit
Admission: RE | Admit: 2022-06-11 | Discharge: 2022-06-11 | Disposition: A | Discharge status: Home / Self Care | Payer: 59 | Source: Ambulatory Visit | Attending: Radiation Oncology | Admitting: Radiation Oncology

## 2022-06-11 ENCOUNTER — Ambulatory Visit (INDEPENDENT_AMBULATORY_CARE_PROVIDER_SITE_OTHER): Payer: 59 | Admitting: Acute Care

## 2022-06-11 VITALS — BP 118/76 | HR 96 | Temp 99.1°F | Ht 62.0 in | Wt 143.4 lb

## 2022-06-11 DIAGNOSIS — C50212 Malignant neoplasm of upper-inner quadrant of left female breast: Secondary | ICD-10-CM | POA: Diagnosis not present

## 2022-06-11 DIAGNOSIS — J069 Acute upper respiratory infection, unspecified: Secondary | ICD-10-CM

## 2022-06-11 DIAGNOSIS — Z79811 Long term (current) use of aromatase inhibitors: Secondary | ICD-10-CM | POA: Diagnosis not present

## 2022-06-11 DIAGNOSIS — R911 Solitary pulmonary nodule: Secondary | ICD-10-CM | POA: Diagnosis not present

## 2022-06-11 DIAGNOSIS — Z510 Encounter for antineoplastic radiation therapy: Secondary | ICD-10-CM | POA: Diagnosis not present

## 2022-06-11 DIAGNOSIS — R051 Acute cough: Secondary | ICD-10-CM | POA: Diagnosis not present

## 2022-06-11 DIAGNOSIS — Z170 Estrogen receptor positive status [ER+]: Secondary | ICD-10-CM | POA: Diagnosis not present

## 2022-06-11 LAB — RAD ONC ARIA SESSION SUMMARY
Course Elapsed Days: 33
Plan Fractions Treated to Date: 5
Plan Prescribed Dose Per Fraction: 2 Gy
Plan Total Fractions Prescribed: 5
Plan Total Prescribed Dose: 10 Gy
Reference Point Dosage Given to Date: 10 Gy
Reference Point Session Dosage Given: 2 Gy
Session Number: 20

## 2022-06-11 MED ORDER — AZITHROMYCIN 250 MG PO TABS
ORAL_TABLET | ORAL | 0 refills | Status: DC
Start: 2022-06-11 — End: 2022-07-30
  Filled 2022-06-11: qty 6, 5d supply, fill #0

## 2022-06-11 MED ORDER — PREDNISONE 10 MG PO TABS
ORAL_TABLET | ORAL | 0 refills | Status: AC
Start: 2022-06-11 — End: 2022-06-17
  Filled 2022-06-11: qty 12, 6d supply, fill #0

## 2022-06-11 MED ORDER — ALBUTEROL SULFATE HFA 108 (90 BASE) MCG/ACT IN AERS
2.0000 | INHALATION_SPRAY | Freq: Four times a day (QID) | RESPIRATORY_TRACT | 3 refills | Status: DC | PRN
Start: 2022-06-11 — End: 2022-08-30
  Filled 2022-06-11: qty 6.7, 25d supply, fill #0

## 2022-06-11 MED ORDER — HYDROCODONE BIT-HOMATROP MBR 5-1.5 MG/5ML PO SOLN
5.0000 mL | Freq: Every evening | ORAL | 0 refills | Status: DC | PRN
Start: 2022-06-11 — End: 2022-07-30
  Filled 2022-06-11: qty 120, 24d supply, fill #0

## 2022-06-11 MED ORDER — ALBUTEROL SULFATE (2.5 MG/3ML) 0.083% IN NEBU
2.5000 mg | INHALATION_SOLUTION | Freq: Once | RESPIRATORY_TRACT | Status: AC
Start: 2022-06-11 — End: 2022-06-11
  Administered 2022-06-11: 2.5 mg via RESPIRATORY_TRACT

## 2022-06-11 NOTE — Patient Instructions (Addendum)
It is good to see you today. Your CT Chest shows the nodule we have been watching is stable. We will do a 6 month follow up CT Chest without contrast.  You will get a call to get this scheduled closer to the time. You do have a low grade temperature today. We will treat you with a z pack. Take two of the 250 mg tablets today, the one tablet daily for the next 4 days . We will also treat you with a prednisone taper. Your FENO was 33 PPB  in the office today, which indicated inflamed airways.  Prednisone taper; 10 mg tablets: 3 tabs x 2 days, 2 tabs x 2 days 1 tab x 2 days then stop.  I have also sent on Hydromet cough medication for bedtime as needed for sleep Take 5 cc's as needed at bedtime. This will make you sleepy. Do not drive if sleepy. Sips of water instead of throat clearing Sugar Free Coca-Cola or Werther's originals for throat soothing. Delsym Cough syrup 5 cc's every 12 hours Non-sedating antihistamine of your choice daily ( Zyrtec, Allegra, Xyzol, Claritin  (Generic ok)  Start Flonase 2 squirts per right and left nostril daily We will refill your albuterol. Follow up in 6 months after Ct Chest Please contact office for sooner follow up if symptoms do not improve or worsen or seek emergency care

## 2022-06-11 NOTE — Progress Notes (Signed)
History of Present Illness [name] is a 62 y.o. female with  past medical history of anxiety, arthritis, asthma, Crohn's disease, sleep apnea on CPAP.  Lifelong non-smoker.Patient had a CT scan of the chest for cardiac scoring completed on 10/15/2021.  This revealed a part solid right lower lobe 2.7 cm diameter nodule with a 1.2 cm solid component suspicious morphology concerning for adenocarcinoma. Patient was referred 10/17/2021 for evaluation  of pulmonary nodule with Dr. Tonia Brooms.   Breast Cancer Treatment plan: Adjuvant chemotherapy with Taxotere and Cytoxan every 3 weeks x 4 completed 04/15/2022 Adjuvant radiation therapy to be completed 06/07/2022 Adjuvant antiestrogen therapy with anastrozole 1 mg daily to start 06/15/2022    06/11/2022 Pt. Presents for follow up for surveillance of right lower lobe lung nodule. She states that she has been doing well. She was diagnosed with thyroid and breast cancer and has completed chemo and radiation therapy. She is due to start anti-estrogen therapy 06/15/2022.  Repeat CT Chest shows a stable small nodule. Plan is for a 6 month follow up to ensure 12 months of imaging stability.  Today she is here with new cough, similar to what she experiences with her asthma flares. She has had this cough x 2 weeks. Cough is productive more in the evening. Secretions are white to yellow in color. She is not on a maintenance medication. She really only gets her asthma symptoms when she is sick. She has a low grade temperature of 99.1. She did get exposed to a sick great nephew who is living with her and who also seeing a doctor today. As she has recently had chemo and radiation therapy, we will be aggressive and treat with antibiotics and a pred taper ( FENO 33 PPB) .I will also send in some cough medication with Codeine. She does have itching with hydrocodone as a pain medication, but states she does not experience the itching with low doses of cough medication.  Other  than her cough she states she has been doing well. Celebrating having her last radiation treatment today.   Test Results: CT Chest without contrast 06/07/2022 Stable tiny left pulmonary nodules in the 6-7 month interval since prior studies. These are most likely benign. Follow-up CT chest in 6-12 months could be used to demonstrate greater than 12 months of imaging stability.   2. Skin thickening in the left breast is presumably treatment related.   PET Scan 10/29/2021 Minimally metabolic 17 mm mixed density right lower lobe pulmonary nodule may reflect an infectious or inflammatory etiology, but given its morphology a very low-grade primary bronchogenic adenocarcinoma remains a pertinent differential consideration. Consider referral to multidisciplinary tumor board and dedicated chest CT surveillance imaging or versus direct tissue sampling. 2. Hypermetabolic heterogeneous 19 mm right thyroid nodule, suggest further evaluation with thyroid ultrasound and FNA. 3. Hypermetabolic mild symmetric distal esophageal wall thickening with a max SUV of 4.3, possibly reflecting esophagitis. Consider further evaluation with endoscopy. 4. Hypermetabolic hyperplasia of the tonsils slightly asymmetric to the left, nonspecific but commonly reactive. Consider correlation with direct visualization. 5. Focus of hypermetabolic activity about the left acromioclavicular joint without underlying osseous lesion identified, favored inflammatory. 6. Minimally metabolic right posterior gluteal soft tissue nodularity likely reflects sequela of subcutaneous injections or prior trauma. 7. Colonic diverticulosis without findings of acute diverticulitis. 8. Nodular uterine contour likely reflects leiomyomas.     Latest Ref Rng & Units 04/15/2022    8:27 AM 03/25/2022    9:25 AM 03/04/2022  9:25 AM  CBC  WBC 4.0 - 10.5 K/uL 8.0  7.2  11.8   Hemoglobin 12.0 - 15.0 g/dL 16.1  09.6  04.5   Hematocrit 36.0 -  46.0 % 29.6  30.0  33.6   Platelets 150 - 400 K/uL 275  319  457        Latest Ref Rng & Units 04/15/2022    8:27 AM 03/25/2022    9:25 AM 03/04/2022    9:25 AM  BMP  Glucose 70 - 99 mg/dL 96  90  409   BUN 8 - 23 mg/dL 16  17  13    Creatinine 0.44 - 1.00 mg/dL 8.11  9.14  7.82   Sodium 135 - 145 mmol/L 139  138  138   Potassium 3.5 - 5.1 mmol/L 3.8  3.7  3.9   Chloride 98 - 111 mmol/L 108  105  104   CO2 22 - 32 mmol/L 23  26  27    Calcium 8.9 - 10.3 mg/dL 9.5  9.5  9.3     BNP No results found for: "BNP"  ProBNP No results found for: "PROBNP"  PFT    Component Value Date/Time   FEV1PRE 2.58 10/22/2021 0852   FEV1POST 2.67 10/22/2021 0852   FVCPRE 3.49 10/22/2021 0852   FVCPOST 3.50 10/22/2021 0852   TLC 5.42 10/22/2021 0852   DLCOUNC 19.15 10/22/2021 0852   PREFEV1FVCRT 74 10/22/2021 0852   PSTFEV1FVCRT 76 10/22/2021 0852    CT CHEST WO CONTRAST  Result Date: 06/07/2022 CLINICAL DATA:  Pulmonary nodule. Personal history of breast and papillary thyroid cancer. EXAM: CT CHEST WITHOUT CONTRAST TECHNIQUE: Multidetector CT imaging of the chest was performed following the standard protocol without IV contrast. RADIATION DOSE REDUCTION: This exam was performed according to the departmental dose-optimization program which includes automated exposure control, adjustment of the mA and/or kV according to patient size and/or use of iterative reconstruction technique. COMPARISON:  11/30/2021 FINDINGS: Cardiovascular: The heart size is normal. No substantial pericardial effusion. No thoracic aortic aneurysm. No substantial atherosclerosis of the thoracic aorta. Mediastinum/Nodes: No mediastinal lymphadenopathy. No evidence for gross hilar lymphadenopathy although assessment is limited by the lack of intravenous contrast on the current study. The esophagus has normal imaging features. There is no axillary lymphadenopathy. Lungs/Pleura: 4 mm posterior left costophrenic sulcus nodule identified  previously is stable on 120/4 today. 3 mm anterior left upper lobe nodule on 48/4 is unchanged. No new suspicious pulmonary nodule or mass. No focal airspace consolidation. There is no evidence of pleural effusion. Upper Abdomen: Visualized portion of the upper abdomen is unremarkable. Musculoskeletal: Skin thickening left breast is presumably treatment related. No worrisome lytic or sclerotic osseous abnormality. IMPRESSION: 1. Stable tiny left pulmonary nodules in the 6-7 month interval since prior studies. These are most likely benign. Follow-up CT chest in 6-12 months could be used to demonstrate greater than 12 months of imaging stability. 2. Skin thickening in the left breast is presumably treatment related. Electronically Signed   By: Kennith Center M.D.   On: 06/07/2022 09:27     Past medical hx Past Medical History:  Diagnosis Date   Anxiety    Allergy induced asthma   Arthritis    Asthma    Breast cancer (HCC)    Crohn disease (HCC)    Depression    GERD (gastroesophageal reflux disease)    History of methicillin resistant staphylococcus aureus (MRSA)    pt denies this.   Multiple thyroid nodules  Pneumonia    Sleep apnea    CPAP   Thyroid cancer (HCC)      Social History   Tobacco Use   Smoking status: Never    Passive exposure: Never   Smokeless tobacco: Never  Vaping Use   Vaping Use: Never used  Substance Use Topics   Alcohol use: Yes    Alcohol/week: 10.0 standard drinks of alcohol    Types: 10 Glasses of wine per week    Comment: 10 glasses/week   Drug use: No    Never smoker  Tobacco Cessation: Not indicated    Past surgical hx, Family hx, Social hx all reviewed.  Current Outpatient Medications on File Prior to Visit  Medication Sig   Adalimumab (HUMIRA, 2 SYRINGE,) 40 MG/0.4ML PSKT Inject 0.4 mLs (40 mg total) under the skin every 14 (fourteen) days.   anastrozole (ARIMIDEX) 1 MG tablet Take 1 tablet (1 mg total) by mouth daily.   ascorbic acid  (VITAMIN C) 500 MG tablet Take 500 mg by mouth 2 (two) times a week.   bimatoprost (LUMIGAN) 0.01 % SOLN Instill 1 drop into both eyes at bedtime   busPIRone (BUSPAR) 7.5 MG tablet Take 1 tablet (7.5 mg total) by mouth 2 (two) times daily.   celecoxib (CELEBREX) 100 MG capsule Take 1 capsule (100 mg total) by mouth in the morning and at bedtime.   cetirizine (ZYRTEC) 10 MG tablet Take 10 mg by mouth in the morning.   DULoxetine (CYMBALTA) 60 MG capsule Take 1 capsule (60 mg total) by mouth daily.   fluocinonide ointment (LIDEX) 0.05 % Apply 1 application  topically daily as needed (irritation).   levothyroxine (SYNTHROID) 50 MCG tablet Take 2 tablets (100 mcg total) by mouth daily before breakfast.   lidocaine-prilocaine (EMLA) cream Apply to affected area once   omeprazole (PRILOSEC) 20 MG capsule Take 1 capsule (20 mg total) by mouth daily.   Vitamin D, Ergocalciferol, (DRISDOL) 1.25 MG (50000 UNIT) CAPS capsule Take 1 capsule (50,000 Units total) by mouth every 7 (seven) days.   Current Facility-Administered Medications on File Prior to Visit  Medication   0.9 %  sodium chloride infusion     Allergies  Allergen Reactions   Chlorhexidine Gluconate Itching   Gabapentin Itching   Hydrocodone Itching   Oxycodone Hcl Itching    NDC ZHYQ:[id number]  NDC MWUX:[id number]   Betadine [Povidone Iodine] Rash    Review Of Systems:  Constitutional:   No  weight loss, night sweats,  Fevers, chills, fatigue, or  lassitude.  HEENT:   No headaches,  Difficulty swallowing,  Tooth/dental problems, or  Sore throat,                No sneezing, itching, ear ache, nasal congestion, +post nasal drip,   CV:  No chest pain,  Orthopnea, PND, swelling in lower extremities, anasarca, dizziness, palpitations, syncope.   GI  No heartburn, indigestion, abdominal pain, nausea, vomiting, diarrhea, change in bowel habits, loss of appetite, bloody stools.   Resp: No shortness of breath with exertion or at  rest.  + excess mucus, + productive cough,  + non-productive cough,  No coughing up of blood.  No change in color of mucus.  No wheezing.  No chest wall deformity  Skin: no rash or lesions.  GU: no dysuria, change in color of urine, no urgency or frequency.  No flank pain, no hematuria   MS:  No joint pain or swelling.  No decreased range of motion.  No back pain.  Psych:  No change in mood or affect. No depression or anxiety.  No memory loss.   Vital Signs BP 118/76 (BP Location: Left Arm, Patient Position: Sitting, Cuff Size: Normal)   Pulse 96   Temp 99.1 F (37.3 C) (Oral)   Ht 5\' 2"  (1.575 m)   Wt 143 lb 6.4 oz (65 kg)   LMP 01/10/2014   SpO2 98%   BMI 26.23 kg/m    Physical Exam:  General- No distress,  A&Ox3,  coughing ENT: No sinus tenderness, TM clear, pale nasal mucosa, no oral exudate,no post nasal drip, no LAN Cardiac: S1, S2, regular rate and rhythm, no murmur, slightly diminished per bases Abd.: Soft Non-tender, ND, BS +, Body mass index is 26.23 kg/m.  Ext: No clubbing cyanosis, edema Neuro:  normal strength, MAE x 4, A&O x 3, appropriate Skin: No rashes, warm and dry, no lesions  Psych: normal mood and behavior   Assessment/Plan RLL Lung Nodule Stable on 6 month follow up imaging Plan We will do a 6 month follow up CT Chest without contrast for continued stability  You will get a call to get this scheduled closer to the time. Follow up after CT Chest to review results  Cough/ Asthma Flare  Post Chemo 4/24 Post radiation therapy 5/24 Will treat as immunocompromised patient  Plan You do have a low grade temperature today. We will treat you with a z pack. Take two of the 250 mg tablets today, the one tablet daily for the next 4 days . We will also treat you with a prednisone taper. Your FENO was 33 PPB  in the office today, which indicated inflamed airways.  Prednisone taper; 10 mg tablets: 3 tabs x 2 days, 2 tabs x 2 days 1 tab x 2 days then stop.   I have also sent on Hydromet cough medication for bedtime as needed for sleep Take 5 cc's as needed at bedtime. This will make you sleepy. Do not drive if sleepy. Sips of water instead of throat clearing Sugar Free Coca-Cola or Werther's originals for throat soothing. Delsym Cough syrup 5 cc's every 12 hours Non-sedating antihistamine of your choice daily ( Zyrtec, Allegra, Xyzol, Claritin  (Generic ok)  Start Flonase 2 squirts per right and left nostril daily We will refill your albuterol. Follow up as needed. Call to be seen if you get worse not better. Follow up in 6 months after Ct Chest to review results Please contact office for sooner follow up if symptoms do not improve or worsen or seek emergency care    I spent 40 minutes dedicated to the care of this patient on the date of this encounter to include pre-visit review of records, face-to-face time with the patient discussing conditions above, post visit ordering of testing, clinical documentation with the electronic health record, making appropriate referrals as documented, and communicating necessary information to the patient's healthcare team.    Bevelyn Ngo, NP 06/11/2022  1:19 PM

## 2022-06-11 NOTE — Addendum Note (Signed)
Addended by: Lanna Poche on: 06/11/2022 01:34 PM   Modules accepted: Orders

## 2022-06-13 ENCOUNTER — Other Ambulatory Visit: Payer: Self-pay

## 2022-06-17 ENCOUNTER — Encounter: Payer: Self-pay | Admitting: *Deleted

## 2022-06-17 NOTE — Progress Notes (Unsigned)
Pt requesting when port a cath can be removed.  Per MD okay for pt to have port removed now.  RN sent message to Dr. Luisa Hart and his nurse to schedule pt.

## 2022-06-18 NOTE — Radiation Completion Notes (Signed)
Patient Name: [name], HARGROW MRN: [id number] Date of Birth: 02-12-1960 Referring Physician: Serena Croissant, M.D. Date of Service: 2022-06-18 Radiation Oncologist: Arnette Schaumann, M.D. [facility] [place]                             RADIATION ONCOLOGY END OF TREATMENT NOTE     Diagnosis: C50.212 Malignant neoplasm of upper-inner quadrant of left female breast Staging on 2022-01-16: Malignant neoplasm of upper-inner quadrant of left breast in female, estrogen receptor positive (HCC) T=cT1c, N=cN0, M=cM0 Intent: Curative     ==========DELIVERED PLANS==========  First Treatment Date: 2022-05-09 - Last Treatment Date: 2022-06-11   Plan Name: Breast_L_BH Site: Breast, Left Technique: 3D Mode: Photon Dose Per Fraction: 2.67 Gy Prescribed Dose (Delivered / Prescribed): 40.05 Gy / 40.05 Gy Prescribed Fxs (Delivered / Prescribed): 15 / 15   Plan Name: Brst_L_BH_Bst Site: Breast, Left Technique: 3D Mode: Photon Dose Per Fraction: 2 Gy Prescribed Dose (Delivered / Prescribed): 10 Gy / 10 Gy Prescribed Fxs (Delivered / Prescribed): 5 / 5     ==========ON TREATMENT VISIT DATES========== 2022-05-15, 2022-05-21, 2022-05-28, 2022-06-04, 2022-06-11     ==========UPCOMING VISITS==========       ==========APPENDIX - ON TREATMENT VISIT NOTES==========   See weekly On Treatment Notes in Epic for details.

## 2022-06-19 ENCOUNTER — Telehealth: Payer: Self-pay | Admitting: Hematology and Oncology

## 2022-06-19 NOTE — Telephone Encounter (Signed)
Scheduled appointments per scheduling message. Patient is aware of the made appointments.  

## 2022-06-20 ENCOUNTER — Other Ambulatory Visit: Payer: Self-pay

## 2022-06-20 ENCOUNTER — Inpatient Hospital Stay: Payer: 59 | Attending: Hematology and Oncology

## 2022-06-20 DIAGNOSIS — Z170 Estrogen receptor positive status [ER+]: Secondary | ICD-10-CM | POA: Diagnosis not present

## 2022-06-20 DIAGNOSIS — Z95828 Presence of other vascular implants and grafts: Secondary | ICD-10-CM

## 2022-06-20 DIAGNOSIS — C50212 Malignant neoplasm of upper-inner quadrant of left female breast: Secondary | ICD-10-CM | POA: Insufficient documentation

## 2022-06-20 DIAGNOSIS — Z79811 Long term (current) use of aromatase inhibitors: Secondary | ICD-10-CM | POA: Insufficient documentation

## 2022-06-20 MED ORDER — SODIUM CHLORIDE 0.9% FLUSH
10.0000 mL | Freq: Once | INTRAVENOUS | Status: AC
  Administered 2022-06-20: 10 mL

## 2022-06-20 MED ORDER — HEPARIN SOD (PORK) LOCK FLUSH 100 UNIT/ML IV SOLN
500.0000 [IU] | Freq: Once | INTRAVENOUS | Status: AC
  Administered 2022-06-20: 500 [IU]

## 2022-06-25 ENCOUNTER — Other Ambulatory Visit: Payer: Self-pay | Admitting: Internal Medicine

## 2022-06-25 ENCOUNTER — Other Ambulatory Visit (HOSPITAL_COMMUNITY): Payer: Self-pay

## 2022-06-25 MED ORDER — BUSPIRONE HCL 7.5 MG PO TABS
7.5000 mg | ORAL_TABLET | Freq: Two times a day (BID) | ORAL | 2 refills | Status: DC
  Filled 2022-06-25: qty 180, 90d supply, fill #0
  Filled 2022-09-19: qty 180, 90d supply, fill #1
  Filled 2022-12-25: qty 180, 90d supply, fill #2

## 2022-06-26 ENCOUNTER — Other Ambulatory Visit: Payer: Self-pay

## 2022-06-28 ENCOUNTER — Other Ambulatory Visit: Payer: Self-pay

## 2022-06-28 ENCOUNTER — Other Ambulatory Visit (HOSPITAL_COMMUNITY): Payer: Self-pay

## 2022-07-01 ENCOUNTER — Encounter: Payer: Self-pay | Admitting: *Deleted

## 2022-07-01 NOTE — Progress Notes (Signed)
Received dental clearance from Dr. Filbert Berthold for pt to undergo dental implants.  Per MD okay to proceed, clearance successfully faxed back to [phone].

## 2022-07-02 ENCOUNTER — Other Ambulatory Visit (HOSPITAL_COMMUNITY): Payer: Self-pay

## 2022-07-02 MED ORDER — TRIAZOLAM 0.25 MG PO TABS
0.2500 mg | ORAL_TABLET | ORAL | 0 refills | Status: DC
  Filled 2022-07-02: qty 1, 1d supply, fill #0

## 2022-07-04 DIAGNOSIS — Z452 Encounter for adjustment and management of vascular access device: Secondary | ICD-10-CM | POA: Diagnosis not present

## 2022-07-12 ENCOUNTER — Encounter: Payer: Self-pay | Admitting: Radiation Oncology

## 2022-07-14 NOTE — Progress Notes (Signed)
  Radiation Oncology         [phone]) [phone] ________________________________  Name: [name] MRN: [id number]  Date: 07/15/2022  DOB: 19-Jun-1960  End of Treatment Note  Diagnosis:   The encounter diagnosis was Malignant neoplasm of upper-inner quadrant of left breast in female, estrogen receptor positive (HCC).   Stage IA (cT1c, cN0, cM0) Left Breast UIQ, Invasive ductal adenocarcinoma, ER+ / PR+ / Her2-, Grade 2 : s/p lumpectomy and adjuvant chemotherapy with TC x 4 cycles    Concurrent diagnosis of thyroid cancer : s/p total thyroidectomy  and radioactive iodine therapy      Indication for treatment:  Curative        Radiation treatment dates:   05/09/22 through 06/11/22   Site/dose:  1) Left breast 0 40.05 Gy delivered in 15 Fx at 2.67 Gy/Fx 2) Left breast boost - 10 Gy delivered in 5 Fx at 2 Gy/Fx  Beams/energy: 10X-FFF  Technique/Mode: 3D/Photon   Narrative: The patient tolerated radiation treatment relatively well. On the date of her final treatment, the patient endorsed fatigue and skin irritation. Physical exam performed on the date of her final treatment showed radiation dermatitis with some dry desquamation in the upper inner aspect of the left breast area. Hyperpigmentation changes were also noted throughout.  No moist desquamation was appreciated.   The patient also contracted a URI the week prior to completing radiation. She was seen by pulmonary medicine on 06/11/22 and placed on steroids and a Z-pak. She was also prescribed cough medication.   Plan: The patient has completed radiation treatment. The patient will return to radiation oncology clinic for routine followup in one month. I advised them to call or return sooner if they have any questions or concerns related to their recovery or treatment.  -----------------------------------  Billie Lade, PhD, MD  This document serves as a record of services personally performed by Antony Blackbird, MD. It was created  on his behalf by Neena Rhymes, a trained medical scribe. The creation of this record is based on the scribe's personal observations and the provider's statements to them. This document has been checked and approved by the attending provider.

## 2022-07-14 NOTE — Progress Notes (Signed)
Radiation Oncology         [phone] ________________________________  Name: [name] MRN: [id number]  Date: 07/15/2022  DOB: 05/13/1960  Follow-Up Visit Note  CC: Stacey Mackintosh, MD  Harriette Bouillon, MD  No diagnosis found.  Diagnosis: The encounter diagnosis was Malignant neoplasm of upper-inner quadrant of left breast in female, estrogen receptor positive (HCC).   Stage IA (cT1c, cN0, cM0) Left Breast UIQ, Invasive ductal adenocarcinoma, ER+ / PR+ / Her2-, Grade 2 : s/p lumpectomy and adjuvant chemotherapy with TC x 4 cycles, followed by XRT   Concurrent diagnosis of thyroid cancer : s/p total thyroidectomy  and radioactive iodine therapy      Interval Since Last Radiation:  1 month and 3 days    Indication for treatment:  Curative       Radiation treatment dates:   05/09/22 through 06/11/22  Site/dose:  1) Left breast 0 40.05 Gy delivered in 15 Fx at 2.67 Gy/Fx 2) Left breast boost - 10 Gy delivered in 5 Fx at 2 Gy/Fx Beams/energy: 10X-FFF   Narrative:  The patient returns today for routine follow-up. The patient tolerated radiation treatment relatively well. On the date of her final treatment, the patient endorsed fatigue and skin irritation. Physical exam performed on the date of her final treatment showed radiation dermatitis with some dry desquamation in the upper inner aspect of the left breast area. Hyperpigmentation changes were also noted throughout.  No moist desquamation was appreciated.    The patient also contracted a URI the week prior to completing radiation. She was seen by pulmonary medicine on 06/11/22 and placed on steroids and a Z-pak. She was also prescribed cough medication.   During her most recent follow-up visit with Dr. Pamelia Hoit on 06/04/22, the patient opted to proceed with adjuvant antiestrogen therapy consisting of anastrozole. Per encounter notes, she began taking this on 06/15/2022.        Pertinent imaging performed in the interval  includes a chest CT on 06/04/22 (for follow-up of pulmonary nodules) which showed stability of several tiny left sided pulmonary nodules. CT also demonstrated skin thickening in the left breast which is presumably treatment related.       ***                   Allergies:  is allergic to chlorhexidine gluconate, gabapentin, hydrocodone, oxycodone hcl, and betadine [povidone iodine].  Meds: Current Outpatient Medications  Medication Sig Dispense Refill   Adalimumab (HUMIRA, 2 SYRINGE,) 40 MG/0.4ML PSKT Inject 0.4 mLs (40 mg total) under the skin every 14 (fourteen) days. 2 each 5   albuterol (VENTOLIN HFA) 108 (90 Base) MCG/ACT inhaler Inhale 2 puffs into the lungs every 6 (six) hours as needed for wheezing or shortness of breath. 6.7 g 3   anastrozole (ARIMIDEX) 1 MG tablet Take 1 tablet (1 mg total) by mouth daily. 90 tablet 3   ascorbic acid (VITAMIN C) 500 MG tablet Take 500 mg by mouth 2 (two) times a week.     azithromycin (ZITHROMAX Z-PAK) 250 MG tablet Take 2 tablets today and one tablet once daily for the next 4 days 6 tablet 0   bimatoprost (LUMIGAN) 0.01 % SOLN Instill 1 drop into both eyes at bedtime 2.5 mL 10   busPIRone (BUSPAR) 7.5 MG tablet Take 1 tablet (7.5 mg total) by mouth 2 (two) times daily. 180 tablet 2   celecoxib (CELEBREX) 100 MG capsule Take 1 capsule (100 mg total) by mouth in  the morning and at bedtime. 60 capsule 11   cetirizine (ZYRTEC) 10 MG tablet Take 10 mg by mouth in the morning.     DULoxetine (CYMBALTA) 60 MG capsule Take 1 capsule (60 mg total) by mouth daily. 90 capsule 1   fluocinonide ointment (LIDEX) 0.05 % Apply 1 application  topically daily as needed (irritation).     HYDROcodone bit-homatropine (HYDROMET) 5-1.5 MG/5ML syrup Take 5 mLs by mouth at bedtime as needed for cough. 120 mL 0   levothyroxine (SYNTHROID) 50 MCG tablet Take 2 tablets (100 mcg total) by mouth daily before breakfast. 180 tablet 3   lidocaine-prilocaine (EMLA) cream Apply to  affected area once 30 g 3   omeprazole (PRILOSEC) 20 MG capsule Take 1 capsule (20 mg total) by mouth daily. 90 capsule 3   triazolam (HALCION) 0.25 MG tablet Take 1 tablet (0.25 mg total) by mouth 1.5 hours before your dental appointment 1 tablet 0   Vitamin D, Ergocalciferol, (DRISDOL) 1.25 MG (50000 UNIT) CAPS capsule Take 1 capsule (50,000 Units total) by mouth every 7 (seven) days. 12 capsule 0   Current Facility-Administered Medications  Medication Dose Route Frequency Provider Last Rate Last Admin   0.9 %  sodium chloride infusion  500 mL Intravenous Continuous Armbruster, Willaim Rayas, MD        Physical Findings: The patient is in no acute distress. Patient is alert and oriented.  vitals were not taken for this visit. .  No significant changes. Lungs are clear to auscultation bilaterally. Heart has regular rate and rhythm. No palpable cervical, supraclavicular, or axillary adenopathy. Abdomen soft, non-tender, normal bowel sounds.  Right Breast: no palpable mass, nipple discharge or bleeding. Left Breast: ***  Lab Findings: Lab Results  Component Value Date   WBC 8.0 04/15/2022   HGB 10.0 (L) 04/15/2022   HCT 29.6 (L) 04/15/2022   MCV 94.0 04/15/2022   PLT 275 04/15/2022    Radiographic Findings: No results found.  Impression: Stage IA (cT1c, cN0, cM0) Left Breast UIQ, Invasive ductal adenocarcinoma, ER+ / PR+ / Her2-, Grade 2 : s/p lumpectomy and adjuvant chemotherapy with TC x 4 cycles, followed by XRT   Concurrent diagnosis of thyroid cancer : s/p total thyroidectomy  and radioactive iodine therapy      The patient is recovering from the effects of radiation.  ***  Plan:  ***   *** minutes of total time was spent for this patient encounter, including preparation, face-to-face counseling with the patient and coordination of care, physical exam, and documentation of the encounter. ____________________________________  Billie Lade, PhD, MD  This document serves as  a record of services personally performed by Antony Blackbird, MD. It was created on his behalf by Neena Rhymes, a trained medical scribe. The creation of this record is based on the scribe's personal observations and the provider's statements to them. This document has been checked and approved by the attending provider.

## 2022-07-15 ENCOUNTER — Encounter: Payer: Self-pay | Admitting: Radiation Oncology

## 2022-07-15 ENCOUNTER — Other Ambulatory Visit: Payer: Self-pay

## 2022-07-15 ENCOUNTER — Ambulatory Visit
Admission: RE | Admit: 2022-07-15 | Discharge: 2022-07-15 | Disposition: A | Discharge status: Home / Self Care | Payer: 59 | Source: Ambulatory Visit | Attending: Radiation Oncology | Admitting: Radiation Oncology

## 2022-07-15 VITALS — BP 103/72 | HR 93 | Temp 97.8°F | Resp 20 | Ht 62.0 in | Wt 143.6 lb

## 2022-07-15 DIAGNOSIS — Z923 Personal history of irradiation: Secondary | ICD-10-CM | POA: Insufficient documentation

## 2022-07-15 DIAGNOSIS — Z170 Estrogen receptor positive status [ER+]: Secondary | ICD-10-CM | POA: Insufficient documentation

## 2022-07-15 DIAGNOSIS — C50212 Malignant neoplasm of upper-inner quadrant of left female breast: Secondary | ICD-10-CM | POA: Diagnosis not present

## 2022-07-15 DIAGNOSIS — C73 Malignant neoplasm of thyroid gland: Secondary | ICD-10-CM | POA: Diagnosis not present

## 2022-07-15 HISTORY — DX: Personal history of irradiation: Z92.3

## 2022-07-15 NOTE — Progress Notes (Signed)
[name] presents today for follow-up after completing radiation to her left breast on 05-09-22 to 06/11/22  Pain: Denies any pain. Skin: Denies any issues with skin related to radiation. ROM: Denies any issues Lymphedema: Denies MedOnc F/U:  07/31/2022 Other issues of note:   Pt reports Yes No Comments  Tamoxifen []  []    Letrozole []  []    Anastrazole [x]  []    Mammogram []  Date:  []     Vitals:   07/15/22 1508  BP: 103/72  Pulse: 93  Resp: 20  Temp: 97.8 F (36.6 C)  SpO2: 99%  Weight: 65.1 kg  Height: 5\' 2"  (1.575 m)

## 2022-07-16 ENCOUNTER — Telehealth: Payer: Self-pay

## 2022-07-16 ENCOUNTER — Other Ambulatory Visit (HOSPITAL_COMMUNITY): Payer: Self-pay

## 2022-07-16 NOTE — Telephone Encounter (Signed)
Pt called and LVM asking what she can take for joint pain. Of note, Pt started anastrozole on 06/15/22. Pt asking if Celebrex is an option. Will discuss with MD.

## 2022-07-17 ENCOUNTER — Telehealth: Payer: Self-pay

## 2022-07-17 NOTE — Telephone Encounter (Signed)
Called and LVM regarding Pt's joint pain. Per MD, OK to restart Celebrex and try OTC Magnesium 400 mg. If joint pain maintains or increases over the next 2 weeks, can discuss changing anti-estrogen therapy. Gave call back number with any questions.

## 2022-07-22 ENCOUNTER — Other Ambulatory Visit (HOSPITAL_COMMUNITY): Payer: Self-pay

## 2022-07-22 MED ORDER — AMOXICILLIN 500 MG PO CAPS
500.0000 mg | ORAL_CAPSULE | Freq: Three times a day (TID) | ORAL | 0 refills | Status: DC
  Filled 2022-07-22: qty 21, 7d supply, fill #0

## 2022-07-25 ENCOUNTER — Other Ambulatory Visit (HOSPITAL_COMMUNITY): Payer: Self-pay

## 2022-07-29 ENCOUNTER — Other Ambulatory Visit: Payer: Self-pay

## 2022-07-30 ENCOUNTER — Encounter: Payer: Self-pay | Admitting: Internal Medicine

## 2022-07-30 ENCOUNTER — Ambulatory Visit: Payer: 59 | Admitting: Internal Medicine

## 2022-07-30 VITALS — BP 120/78 | HR 95 | Ht 62.0 in | Wt 142.0 lb

## 2022-07-30 DIAGNOSIS — E890 Postprocedural hypothyroidism: Secondary | ICD-10-CM

## 2022-07-30 DIAGNOSIS — C73 Malignant neoplasm of thyroid gland: Secondary | ICD-10-CM | POA: Diagnosis not present

## 2022-07-30 LAB — VITAMIN D 25 HYDROXY (VIT D DEFICIENCY, FRACTURES): VITD: 28.91 ng/mL — ABNORMAL LOW (ref 30.00–100.00)

## 2022-07-30 LAB — TSH: TSH: 0.17 u[IU]/mL — ABNORMAL LOW (ref 0.35–5.50)

## 2022-07-30 LAB — T4, FREE: Free T4: 1.06 ng/dL (ref 0.60–1.60)

## 2022-07-30 NOTE — Progress Notes (Signed)
Patient ID: [name], female   DOB: 1960-11-13, 62 y.o.   MRN: [id number]  HPI  [name] is a 62 y.o.-year-old female, initially referred by Dr. 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  Last visit 4 months ago.  Interim history: She has a history of breast cancer-finished radiation therapy and also, since last visit, she finished chemotherapy 06/11/2022.   She continues to have hot flashes, improved. She has joint pains - chronic, now off Celbrex. On Anastrozole.  She started magnesium.   Thyroid cancer  Patient initially had a PET scan in 10/2021 during investigation of a pulmonary nodule.  This showed a hyperactive thyroid nodule.  Thyroid ultrasound showed a 2.2 cm right thyroid nodule that met criteria for biopsy.  There was also smaller right mid thyroid nodule, measuring 1.3 cm, that only requires follow-up, and a smaller left thyroid nodule, not worrisome.  She had biopsy of the dominant thyroid nodule, which came positive for papillary thyroid carcinoma.  She had total thyroidectomy by Dr. Gerrit Friends on 01/01/2022 >> stage 2 TNM PTC.  She had RAI treatment.  Reviewed available investigation in detail: PET scan (10/29/2021): Hypermetabolic heterogeneous 19 mm right thyroid nodule, suggest further evaluation with thyroid ultrasound and FNA.  Thyroid U/S (11/08/2021): Parenchymal Echotexture: Mildly heterogenous  Isthmus: 0.2 cm  Right lobe: 4.2 x 1.7 x 2.0 cm  Left lobe: 3.8 x 1.3 x 1.2 cm _________________________________________________________   Nodule # 1:  Location: Right; Mid  Maximum size: 2.2 cm; Other 2 dimensions: 2.1 x 1.7 cm  Composition: solid/almost completely solid (2)  Echogenicity: hypoechoic (2)  Margins: lobulated/irregular (2)  Echogenic foci: macrocalcifications (1)  ACR TI-RADS total points: 7. **Given size (>/= 1.0 cm) and appearance, fine needle aspiration of this highly suspicious nodule  should be considered based on TI-RADS criteria.    _________________________________________________________   Nodule # 2:  Location: Right; Mid  Maximum size: 1.3 cm; Other 2 dimensions: 1.3 x 1.0 cm  Composition: solid/almost completely solid (2)  Echogenicity: hypoechoic (2) ACR TI-RADS total points: 4. *Given size (>/= 1 - 1.4 cm) and appearance, a follow-up ultrasound in 1 year should be considered based on TI-RADS criteria.  _________________________________________________________   Nodule # 3: Small mixed cystic and solid nodule in the left mid gland does not warrant further evaluation.   IMPRESSION: 1. Approximately 2.2 cm TI-RADS category 5 nodule in the right upper gland meets criteria to consider fine-needle aspiration biopsy. Biopsy is recommended. 2. A 1.3 cm TI-RADS category 4 nodule in the right mid gland meets criteria for imaging surveillance. Recommend follow-up ultrasound in 1 year.  FNA (11/14/2021):  FINAL MICROSCOPIC DIAGNOSIS:  - Findings consistent with papillary carcinoma (Bethesda category VI)  SPECIMEN ADEQUACY:  Satisfactory for evaluation   Total thyroidectomy (01/01/2022)-Dr. Gerkin  Tumor Focality: Unifocal Tumor Site: Right lobe Tumor Size: 2.3 x 1.5 x 1.2 cm Histologic Type: Papillary carcinoma, classic and infiltrative follicular subtypes Angioinvasion: Not identified Lymphatic Invasion: Not identified Extrathyroidal Extension: Tumor focally invades perithyroidal soft tissue Margin Status: All margins negative for carcinoma Regional Lymph Node Status:      Number of Lymph Nodes with Tumor: 0      Number of Lymph Nodes Examined: 2      Nodal Level(s) Examined: Level VI Distant Metastasis: Not applicable Pathologic Stage Classification (pTNM, AJCC 8th Edition): pT2b, pN0 Tumor larger than 2 cm, but without lymph node lymphatic, or blood vessel invasion.  Clinical TNM stage II.  04/03/2022: RAI treatment 70.8 mCi I-131  Posttreatment  whole-body scan (04/10/2022): No evidence of thyroid remnant or iodine avid metastatic thyroid cancer.  Postsurgical hypothyroidism:  Pt is on levothyroxine 100 mcg (50 mcg x 2) daily, taken: - in am - fasting - at least 2h from b'fast - stopped calcium 2x a day - first dose in am  - no iron -  no multivitamins in am - + PPIs -Omeprazole- in am >> moved >4h later -she takes this for eosinophilic esophagitis. - stopped Biotin - in B complex   I reviewed pt's thyroid tests: Lab Results  Component Value Date   TSH 1.492 03/04/2022   TSH 1.77 02/01/2022   TSH 3.08 08/30/2021   TSH 2.65 03/02/2020   TSH 2.96 09/25/2018   TSH 1.37 02/11/2018   TSH 1.32 07/10/2017   TSH 2.95 05/05/2015   FREET4 1.5 02/01/2022   FREET4 1.1 05/05/2015    She has no FH of thyroid disorders. No FH of thyroid cancer.  No h/o radiation tx to head or neck.  She had a low calcium after surgery: Lab Results  Component Value Date   PTH 21 02/11/2018   CALCIUM 9.5 04/15/2022   CALCIUM 9.5 03/25/2022   CALCIUM 9.3 03/04/2022   CALCIUM 9.1 02/18/2022   CALCIUM 9.6 02/11/2022   CALCIUM 8.8 (L) 01/02/2022   CALCIUM 9.8 08/30/2021   CALCIUM 8.6 03/02/2020   CALCIUM 9.4 09/25/2018   CALCIUM 9.4 02/11/2018   CALCIUM 9.2 02/11/2018   CALCIUM 8.9 07/10/2017   CALCIUM 9.3 04/05/2010  She started calcium 500 mg (Tums) twice a day after the surgery >> now off.  Vitamin D levels were normal: Lab Results  Component Value Date   VD25OH 67 03/02/2020   VD25OH 60 09/25/2018   VD25OH 32 07/10/2017  She is on vitamin D 50,000 units weekly >> but ran out few weeks ago...  She has a past medical history of Crohn's disease, eosinophilic esophagitis, GERD, OSA, HL, lung nodule - benign.  She had breast lumpectomy 01/01/2022, at the same time as her thyroidectomy.  ROS:+ see HPI + hot flashes at night, despite HRT  Past Medical History:  Diagnosis Date   Anxiety    Allergy induced asthma   Arthritis     Asthma    Breast cancer (HCC)    Crohn disease (HCC)    Depression    GERD (gastroesophageal reflux disease)    History of methicillin resistant staphylococcus aureus (MRSA)    pt denies this.   History of radiation therapy    Left Breast- 05/09/22-06/11/22-Dr. Antony Blackbird   Multiple thyroid nodules    Pneumonia    Sleep apnea    CPAP   Thyroid cancer [facility])    Past Surgical History:  Procedure Laterality Date   ANAL FISSURE REPAIR  2004   BREAST LUMPECTOMY WITH RADIOACTIVE SEED LOCALIZATION Left 01/01/2022   Procedure: LEFT BREAST SEED LUMPECTOMY;  Surgeon: Harriette Bouillon, MD;  Location: [facility] OR;  Service: General;  Laterality: Left;   CESAREAN SECTION     COLONOSCOPY     INSERTION OF MESH N/A 07/26/2020   Procedure: INSERTION OF MESH;  Surgeon: Axel Filler, MD;  Location: [facility] OR;  Service: General;  Laterality: N/A;   LIGAMENT REPAIR Right 11/23/2015   Procedure: right index radial collateral LIGAMENT REPAIR;  Surgeon: Betha Loa, MD;  Location: [facility];  Service: Orthopedics;  Laterality: Right;   PORTACATH PLACEMENT Right 02/07/2022   Procedure: INSERTION PORT-A-CATH;  Surgeon: Harriette Bouillon, MD;  Location: [facility] OR;  Service: General;  Laterality: Right;   RE-EXCISION OF BREAST LUMPECTOMY Left 01/22/2022   Procedure: RE-EXCISION OF LEFT BREAST LUMPECTOMY;  Surgeon: Harriette Bouillon, MD;  Location: [facility];  Service: General;  Laterality: Left;   SENTINEL NODE BIOPSY Left 01/22/2022   Procedure: LEFT SENTINEL NODE BIOPSY;  Surgeon: Harriette Bouillon, MD;  Location: [facility];  Service: General;  Laterality: Left;   THYROIDECTOMY N/A 01/01/2022   Procedure: TOTAL THYROIDECTOMY WITH LIMITED LYMPH NODE DISSECTION;  Surgeon: Darnell Level, MD;  Location: [facility] OR;  Service: General;  Laterality: N/A;   UPPER ENDOSCOPY W/ ESOPHAGEAL MANOMETRY  10/2021   VENTRAL HERNIA REPAIR N/A 07/26/2020   Procedure: LAPAROSCOPIC VENTRAL HERNIA REPAIR  WITH MESH;  Surgeon: Axel Filler, MD;  Location: [facility] OR;  Service: General;  Laterality: N/A;   Social History   Socioeconomic History   Marital status: Married    Spouse name: Not on file   Number of children: 2   Years of education: Not on file   Highest education level: Not on file  Occupational History   Occupation: Self employed  Tobacco Use   Smoking status: Never    Passive exposure: Never   Smokeless tobacco: Never  Vaping Use   Vaping status: Never Used  Substance and Sexual Activity   Alcohol use: Yes    Alcohol/week: 10.0 standard drinks of alcohol    Types: 10 Glasses of wine per week    Comment: 10 glasses/week   Drug use: No   Sexual activity: Yes    Birth control/protection: Post-menopausal  Other Topics Concern   Not on file  Social History Narrative   Not on file   Social Determinants of Health   Financial Resource Strain: Not on file  Food Insecurity: No Food Insecurity (04/29/2022)   Hunger Vital Sign    Worried About Running Out of Food in the Last Year: Never true    Ran Out of Food in the Last Year: Never true  Transportation Needs: No Transportation Needs (04/29/2022)   PRAPARE - Administrator, Civil Service (Medical): No    Lack of Transportation (Non-Medical): No  Physical Activity: Not on file  Stress: Not on file  Social Connections: Not on file  Intimate Partner Violence: Not on file   Current Outpatient Medications on File Prior to Visit  Medication Sig Dispense Refill   adalimumab (HUMIRA, 2 SYRINGE,) 40 MG/0.4ML prefilled syringe Inject 0.4 mLs (40 mg total) under the skin every 14 (fourteen) days. 2 each 5   albuterol (VENTOLIN HFA) 108 (90 Base) MCG/ACT inhaler Inhale 2 puffs into the lungs every 6 (six) hours as needed for wheezing or shortness of breath. 6.7 g 3   amoxicillin (AMOXIL) 500 MG capsule Take 1 capsule (500 mg total) by mouth 3 (three) times daily until gone 21 capsule 0   anastrozole (ARIMIDEX) 1 MG  tablet Take 1 tablet (1 mg total) by mouth daily. 90 tablet 3   ascorbic acid (VITAMIN C) 500 MG tablet Take 500 mg by mouth 2 (two) times a week.     azithromycin (ZITHROMAX Z-PAK) 250 MG tablet Take 2 tablets today and one tablet once daily for the next 4 days 6 tablet 0   bimatoprost (LUMIGAN) 0.01 % SOLN Instill 1 drop into both eyes at bedtime 2.5 mL 10   busPIRone (BUSPAR) 7.5 MG tablet Take 1 tablet (7.5 mg total) by mouth 2 (two)  times daily. 180 tablet 2   celecoxib (CELEBREX) 100 MG capsule Take 1 capsule (100 mg total) by mouth in the morning and at bedtime. (Patient not taking: Reported on 07/15/2022) 60 capsule 11   cetirizine (ZYRTEC) 10 MG tablet Take 10 mg by mouth in the morning. (Patient not taking: Reported on 07/15/2022)     DULoxetine (CYMBALTA) 60 MG capsule Take 1 capsule (60 mg total) by mouth daily. 90 capsule 1   fluocinonide ointment (LIDEX) 0.05 % Apply 1 application  topically daily as needed (irritation). (Patient not taking: Reported on 07/15/2022)     HYDROcodone bit-homatropine (HYDROMET) 5-1.5 MG/5ML syrup Take 5 mLs by mouth at bedtime as needed for cough. 120 mL 0   levothyroxine (SYNTHROID) 50 MCG tablet Take 2 tablets (100 mcg total) by mouth daily before breakfast. 180 tablet 3   lidocaine-prilocaine (EMLA) cream Apply to affected area once (Patient not taking: Reported on 07/15/2022) 30 g 3   omeprazole (PRILOSEC) 20 MG capsule Take 1 capsule (20 mg total) by mouth daily. 90 capsule 3   triazolam (HALCION) 0.25 MG tablet Take 1 tablet (0.25 mg total) by mouth 1.5 hours before your dental appointment (Patient not taking: Reported on 07/15/2022) 1 tablet 0   Vitamin D, Ergocalciferol, (DRISDOL) 1.25 MG (50000 UNIT) CAPS capsule Take 1 capsule (50,000 Units total) by mouth every 7 (seven) days. 12 capsule 0   Current Facility-Administered Medications on File Prior to Visit  Medication Dose Route Frequency Provider Last Rate Last Admin   0.9 %  sodium chloride infusion   500 mL Intravenous Continuous Armbruster, Willaim Rayas, MD       Allergies  Allergen Reactions   Chlorhexidine Gluconate Itching   Gabapentin Itching   Hydrocodone Itching   Oxycodone Hcl Itching    NDC WUJW:[id number]  NDC OZHY:[id number]   Betadine [Povidone Iodine] Rash   Family History  Problem Relation Age of Onset   Alcohol abuse Mother    Alcohol abuse Sister    Angioedema Neg Hx    Allergic rhinitis Neg Hx    Asthma Neg Hx    Atopy Neg Hx    Eczema Neg Hx    Immunodeficiency Neg Hx    Urticaria Neg Hx    Colon cancer Neg Hx    Stomach cancer Neg Hx    Esophageal cancer Neg Hx    Colon polyps Neg Hx   + HL in uncle, aunt  PE: BP 120/78   Pulse 95   Ht 5\' 2"  (1.575 m)   Wt 142 lb (64.4 kg)   LMP 01/10/2014   SpO2 98%   BMI 25.97 kg/m  Wt Readings from Last 3 Encounters:  07/30/22 142 lb (64.4 kg)  07/15/22 143 lb 9.6 oz (65.1 kg)  06/11/22 143 lb 6.4 oz (65 kg)   Constitutional: normal weight, in NAD Eyes:  EOMI, no exophthalmos ENT: no neck masses palpated, thyroidectomy scar healed, no cervical lymphadenopathy Cardiovascular: RRR, No MRG Respiratory: CTA B Musculoskeletal: no deformities Skin: no rashes Neurological: no tremor with outstretched hands  ASSESSMENT: 1. Thyroid cancer - see HPI  2. Postsurgical Hypothyroidism  3.  Postsurgical hypocalcemia  PLAN:  1.  Papillary thyroid cancer -Fairly recent thyroid cancer diagnosis, status post total thyroidectomy by Dr. Gerrit Friends 01/01/2022.  She had both classic and infiltrative type of PTC, with a tumor invading the surrounding thyroid tissue.  RAI treatment was recommended for remnant thyroid ablation and to ablate possible micrometastasis.  However, we discussed that the  most important reason for this is to be able to follow her with thyroglobulin levels afterwards. -She had RAI treatment 6 days prior to our last visit (04/03/2022) and had a whole-body scan obtained right after her last visit  (04/10/2022).  This did not show any abnormal uptake. -At today's visit, we will check her thyroglobulin and ATA antibodies -Discussed about the fact that the trend is more important than the absolute value, but ideally, if the RAI treatment, her thyroglobulin would become undetectable -I will see her back in approximately 6 months  2.  Patient with history of total thyroidectomy for thyroid cancer, now with iatrogenic hypothyroidism - latest thyroid labs reviewed with pt. >> normal: Lab Results  Component Value Date   TSH 1.492 03/04/2022  - she continues on LT4 50 mcg x2 daily (due to intolerance to dyes) - pt feels good on this dose.  She continues to have hot flashes, but these have improved. - we discussed about taking the thyroid hormone every day, with water, >30 minutes before breakfast, separated by >4 hours from acid reflux medications, calcium, iron, multivitamins. Pt. is taking it correctly. - will check thyroid tests today: TSH and fT4 - If labs are abnormal, she will need to return for repeat TFTs in 1.5 months  3.  Postsurgical hypocalcemia -She had a slightly low calcium after surgery and she was calcium supplements, but was able to come off -She was on 50,000 units of vitamin D weekly, but she ran out few weeks ago when she did not have refills -No muscle cramping but has joint pains -Latest calcium level was normal: Lab Results  Component Value Date   CALCIUM 9.5 04/15/2022  -Will check vitamin D level now and decide which dose of vitamin D she needs to restart  Component     Latest Ref Rng 07/30/2022  TSH     0.35 - 5.50 uIU/mL 0.17 (L)   T4,Free(Direct)     0.60 - 1.60 ng/dL 1.61   VITD     09.60 - 100.00 ng/mL 28.91 (L)   Vitamin D level is slightly low.  Would suggest to start 1000 units vitamin D supplement daily. TSH is also slightly suppressed.  Would suggest to decrease the dose of LT4 to 93 mcg daily by only taking 1 tablet of LT4 on Sundays.  After next  check, we may need to decrease the dose further.  Component     Latest Ref Rng 07/30/2022  Thyroglobulin     ng/mL 0.1 (L)   Thyroglobulin Ab     < or = 1 IU/mL <1   Tg is already barely detectable while ATA antibodies are undetectable.  Carlus Pavlov, MD PhD [facility]

## 2022-07-30 NOTE — Patient Instructions (Signed)
Please continue levothyroxine 100 (50 mcg x2) mcg daily.  Take the thyroid hormone every day, with water, at least 30 minutes before breakfast, separated by at least 4 hours from: - acid reflux medications - calcium - iron - multivitamins  Please stop at the lab.  Please return to see me in approximately 6 months.

## 2022-07-31 ENCOUNTER — Other Ambulatory Visit (HOSPITAL_COMMUNITY): Payer: Self-pay

## 2022-07-31 ENCOUNTER — Ambulatory Visit (INDEPENDENT_AMBULATORY_CARE_PROVIDER_SITE_OTHER): Payer: 59 | Admitting: Family Medicine

## 2022-07-31 ENCOUNTER — Other Ambulatory Visit: Payer: Self-pay

## 2022-07-31 VITALS — BP 118/76 | HR 88 | Ht 62.0 in | Wt 142.0 lb

## 2022-07-31 DIAGNOSIS — M79672 Pain in left foot: Secondary | ICD-10-CM

## 2022-07-31 NOTE — Patient Instructions (Signed)
Thank you for coming in today.   Ok to keep you appointment with podiatry.   I think this is a tiny plantar wart.   Ok to reduce pressure with a Corn pad or a bandaid with a hole in it.   Recheck as needed.

## 2022-07-31 NOTE — Progress Notes (Signed)
   Rubin Payor, PhD, LAT, ATC acting as a scribe for Clementeen Graham, MD.  [name] is a 62 y.o. female who presents to [facility] today for L foot pain. Pt does not recall an incident that started the pain. Pt locates pain to the plantar aspect of her L foot along the more lateral portion, 4th-5th MT.    Pertinent review of systems: No fevers or chills  Relevant historical information: Gross disease.  Recent breast and thyroid cancer.   Exam:  BP 118/76   Pulse 88   Ht 5\' 2"  (1.575 m)   Wt 142 lb (64.4 kg)   LMP 01/10/2014   SpO2 99%   BMI 25.97 kg/m  General: Well Developed, well nourished, and in no acute distress.   MSK: Left foot: At the plantar fifth metatarsal head she has a small firm nodule that could represent a tiny retained splinter but could also be a developing plantar wart.  This is mildly tender to palpation with no surrounding erythema.    Lab and Radiology Results  Using an 18-gauge needle the superficial portion of the skin was lifted off of the nodule at the plantar fifth metatarsal.  A tiny sliver of either hair or almost microscopic piece of plant material (less than 0.31mm) was visible on the needle under my loupe magnification lenses.  This could be a retained splinter.  The firm material superficially was removed and patient had much less pain and pressure with standing following the procedure.     Assessment and Plan: 62 y.o. female with left fifth metatarsal nodule thought to be a plantar wart likely.   Discussed warning signs or symptoms. Please see discharge instructions. Patient expresses understanding.   The above documentation has been reviewed and is accurate and complete Clementeen Graham, M.D.

## 2022-08-01 ENCOUNTER — Inpatient Hospital Stay: Payer: 59

## 2022-08-01 ENCOUNTER — Other Ambulatory Visit (HOSPITAL_COMMUNITY): Payer: Self-pay

## 2022-08-01 LAB — THYROGLOBULIN ANTIBODY: Thyroglobulin Ab: <1 IU/mL (ref <=1)

## 2022-08-01 LAB — THYROGLOBULIN LEVEL: Thyroglobulin: 0.1 ng/mL — ABNORMAL LOW

## 2022-08-05 ENCOUNTER — Encounter: Payer: Self-pay | Admitting: Rehabilitation

## 2022-08-05 ENCOUNTER — Ambulatory Visit: Payer: 59 | Attending: Hematology and Oncology | Admitting: Rehabilitation

## 2022-08-05 DIAGNOSIS — Z483 Aftercare following surgery for neoplasm: Secondary | ICD-10-CM | POA: Insufficient documentation

## 2022-08-05 DIAGNOSIS — C50212 Malignant neoplasm of upper-inner quadrant of left female breast: Secondary | ICD-10-CM | POA: Insufficient documentation

## 2022-08-05 DIAGNOSIS — Z170 Estrogen receptor positive status [ER+]: Secondary | ICD-10-CM | POA: Insufficient documentation

## 2022-08-05 NOTE — Therapy (Signed)
OUTPATIENT PHYSICAL THERAPY SOZO SCREENING NOTE   Patient Name: [name] MRN: [id number] DOB:1960/04/14, 62 y.o., female Today's Date: 08/05/2022  PCP: Margaree Mackintosh, MD REFERRING PROVIDER: Serena Croissant, MD   PT End of Session - 08/05/22 0904     Visit Number 11   screen only   PT Start Time 0900    PT Stop Time 0905    PT Time Calculation (min) 5 min             Past Medical History:  Diagnosis Date   Anxiety    Allergy induced asthma   Arthritis    Asthma    Breast cancer (HCC)    Crohn disease (HCC)    Depression    GERD (gastroesophageal reflux disease)    History of methicillin resistant staphylococcus aureus (MRSA)    pt denies this.   History of radiation therapy    Left Breast- 05/09/22-06/11/22-Dr. Antony Blackbird   Multiple thyroid nodules    Pneumonia    Sleep apnea    CPAP   Thyroid cancer [facility])    Past Surgical History:  Procedure Laterality Date   ANAL FISSURE REPAIR  2004   BREAST LUMPECTOMY WITH RADIOACTIVE SEED LOCALIZATION Left 01/01/2022   Procedure: LEFT BREAST SEED LUMPECTOMY;  Surgeon: Harriette Bouillon, MD;  Location: [facility] OR;  Service: General;  Laterality: Left;   CESAREAN SECTION     COLONOSCOPY     INSERTION OF MESH N/A 07/26/2020   Procedure: INSERTION OF MESH;  Surgeon: Axel Filler, MD;  Location: [facility] OR;  Service: General;  Laterality: N/A;   LIGAMENT REPAIR Right 11/23/2015   Procedure: right index radial collateral LIGAMENT REPAIR;  Surgeon: Betha Loa, MD;  Location: [facility];  Service: Orthopedics;  Laterality: Right;   PORTACATH PLACEMENT Right 02/07/2022   Procedure: INSERTION PORT-A-CATH;  Surgeon: Harriette Bouillon, MD;  Location: [facility] OR;  Service: General;  Laterality: Right;   RE-EXCISION OF BREAST LUMPECTOMY Left 01/22/2022   Procedure: RE-EXCISION OF LEFT BREAST LUMPECTOMY;  Surgeon: Harriette Bouillon, MD;  Location: [facility];  Service: General;  Laterality: Left;   SENTINEL NODE  BIOPSY Left 01/22/2022   Procedure: LEFT SENTINEL NODE BIOPSY;  Surgeon: Harriette Bouillon, MD;  Location: [facility];  Service: General;  Laterality: Left;   THYROIDECTOMY N/A 01/01/2022   Procedure: TOTAL THYROIDECTOMY WITH LIMITED LYMPH NODE DISSECTION;  Surgeon: Darnell Level, MD;  Location: [facility] OR;  Service: General;  Laterality: N/A;   UPPER ENDOSCOPY W/ ESOPHAGEAL MANOMETRY  10/2021   VENTRAL HERNIA REPAIR N/A 07/26/2020   Procedure: LAPAROSCOPIC VENTRAL HERNIA REPAIR WITH MESH;  Surgeon: Axel Filler, MD;  Location: [facility] OR;  Service: General;  Laterality: N/A;   Patient Active Problem List   Diagnosis Date Noted   Port-A-Cath in place 03/25/2022   Malignant neoplasm of upper-inner quadrant of left breast in female, estrogen receptor positive (HCC) 01/09/2022   Postsurgical hypothyroidism 01/04/2022   Multiple thyroid nodules 12/20/2021   Papillary thyroid carcinoma (HCC) 12/20/2021   Nodule of lower lobe of right lung 10/17/2021   Left upper lobe pulmonary nodule 10/17/2021   Acromioclavicular sprain, left, initial encounter 07/27/2021   Tibialis posterior tendinitis, right 06/15/2020   Piriformis syndrome of left side 06/15/2020   Umbilical hernia 03/21/2020   Lumbar radiculopathy 07/09/2019   Contusion of right hip and thigh 04/20/2019   Loss of transverse plantar arch 02/11/2019   Sprain of iliolumbar ligament 10/29/2018   Piriformis syndrome of right side  09/30/2018   Nonallopathic lesion of sacral region 09/30/2018   Nonallopathic lesion of lumbosacral region 09/30/2018   Nonallopathic lesion of thoracic region 09/30/2018   Peroneal tendinitis of left lower extremity 02/11/2018   Polyarthralgia 02/11/2018   Patellofemoral syndrome of right knee 02/11/2018   Gastroesophageal reflux disease 01/16/2017   Cough variant asthma 01/16/2017   Noncompliance with medication treatment due to intermittent use of medication 01/16/2017   Allergic rhinoconjunctivitis  11/08/2015   Sinobronchitis 10/03/2015   OSA (obstructive sleep apnea) 12/14/2014   Crohn's disease of colon (HCC) 05/24/2014   Depression 02/01/2014   Disorder of intestine 12/17/2010   Anorectal polyp 12/17/2010   Glaucoma 01/14/2010   Dry skin dermatitis 01/15/2008   Crohn's disease (HCC) 01/14/2005    REFERRING DIAG: left breast cancer at risk for lymphedema  THERAPY DIAG:  Aftercare following surgery for neoplasm  Malignant neoplasm of upper-inner quadrant of left breast in female, estrogen receptor positive (HCC)  PERTINENT HISTORY: Patient was diagnosed on 01/01/2022 with 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 It measures 1.5 cm and is located in the upper inner quadrant with IDC only being present in lateral margin. It is ER/PR positive and HER2 negative with a Ki-67 of 5%. Thyroidectomy was also performed at the same time and 2 lymph nodes removed (nodes were negative) and all gross disease removed. Thyroid cancer will be treated with radioactive iodine   PRECAUTIONS: left UE Lymphedema risk, None  SUBJECTIVE: Pt returns for her 3 month L-Dex screen.   PAIN:  Are you having pain? No  SOZO SCREENING: Patient was assessed today using the SOZO machine to determine the lymphedema index score. This was compared to her baseline score. It was determined that she is within the recommended range when compared to her baseline and no further action is needed at this time. She will continue SOZO screenings. These are done every 3 months for 2 years post operatively followed by every 6 months for 2 years, and then annually.   L-DEX FLOWSHEETS - 08/05/22 0800       L-DEX LYMPHEDEMA SCREENING   Measurement Type Unilateral    L-DEX MEASUREMENT EXTREMITY Upper Extremity    POSITION  Standing    DOMINANT SIDE Right    At Risk Side Left    BASELINE SCORE (UNILATERAL) 2.8    L-DEX SCORE (UNILATERAL) 3.8    VALUE CHANGE (UNILAT) 1                Delaine Canter R, PT 08/05/2022, 9:04 AM

## 2022-08-16 ENCOUNTER — Ambulatory Visit (INDEPENDENT_AMBULATORY_CARE_PROVIDER_SITE_OTHER): Payer: 59 | Admitting: Podiatry

## 2022-08-16 DIAGNOSIS — B070 Plantar wart: Secondary | ICD-10-CM | POA: Diagnosis not present

## 2022-08-16 DIAGNOSIS — L603 Nail dystrophy: Secondary | ICD-10-CM | POA: Diagnosis not present

## 2022-08-16 NOTE — Patient Instructions (Signed)
I have ordered a medication for you that will come from [facility] in [place]. They should be calling you to verify insurance and will mail the medication to you. If you live close by then you can go by their pharmacy to pick up the medication. Their phone number is [phone]. If you do not hear from them in the next few days, please give [place] a call at [phone].   Take dressing off in 8 hours and wash the foot with soap and water. If it is hurting or becomes uncomfortable before the 8 hours, go ahead and remove the bandage and wash the area.  If it blisters, apply antibiotic ointment and a band-aid.  Monitor for any signs/symptoms of infection. Call the office immediately if any occur or go directly to the emergency room. Call with any questions/concerns.

## 2022-08-16 NOTE — Progress Notes (Signed)
Subjective: Chief Complaint  Patient presents with   Plantar Warts    left foot possible plantar wart causing pain   Started a couple weeks ago. Does not remember stepping on anything but it feels like glass. No treatment  Objective: AAO x3, NAD DP/PT pulses palpable bilaterally, CRT less than 3 seconds Left sub 5 poro No pain with calf compression, swelling, warmth, erythema  Assessment:  Plan: -All treatment options discussed with the patient including all alternatives, risks, complications.  -canthron -CC right 2nd nail fungs/dystorphy -Patient encouraged to call the office with any questions, concerns, change in symptoms.

## 2022-08-21 ENCOUNTER — Telehealth: Payer: Self-pay

## 2022-08-21 NOTE — Telephone Encounter (Signed)
Pt called and states she tested + for COVID yesterday after feeling sx since 08/11/22. She states she thought she just had a cold but sx worsened. Advised pt since she is not currently on chemo or immunotherapy MD does not recommend Paxlovid, but if she would like to call her PCP she certainly may. She verbalized thanks and understanding.

## 2022-08-22 ENCOUNTER — Other Ambulatory Visit (HOSPITAL_COMMUNITY): Payer: Self-pay

## 2022-08-22 ENCOUNTER — Encounter: Payer: Self-pay | Admitting: Internal Medicine

## 2022-08-22 ENCOUNTER — Telehealth (INDEPENDENT_AMBULATORY_CARE_PROVIDER_SITE_OTHER): Payer: 59 | Admitting: Internal Medicine

## 2022-08-22 VITALS — HR 77 | Temp 100.0°F

## 2022-08-22 DIAGNOSIS — Z8719 Personal history of other diseases of the digestive system: Secondary | ICD-10-CM

## 2022-08-22 DIAGNOSIS — U071 COVID-19: Secondary | ICD-10-CM

## 2022-08-22 DIAGNOSIS — Z853 Personal history of malignant neoplasm of breast: Secondary | ICD-10-CM | POA: Diagnosis not present

## 2022-08-22 MED ORDER — NIRMATRELVIR/RITONAVIR (PAXLOVID)TABLET
3.0000 | ORAL_TABLET | Freq: Two times a day (BID) | ORAL | 0 refills | Status: AC
Start: 2022-08-22 — End: 2022-08-27
  Filled 2022-08-22: qty 30, 5d supply, fill #0

## 2022-08-22 MED ORDER — HYDROCODONE BIT-HOMATROP MBR 5-1.5 MG/5ML PO SOLN
5.0000 mL | Freq: Three times a day (TID) | ORAL | 0 refills | Status: DC | PRN
Start: 2022-08-22 — End: 2023-03-10
  Filled 2022-08-22: qty 120, 8d supply, fill #0

## 2022-08-22 NOTE — Patient Instructions (Addendum)
Patient has acute COVID-19.  Her GFR is normal and she will be prescribed regular strength Paxlovid.  Also prescribed Hycodan 1 teaspoon every 8 hours as needed for cough.  Is to stay well-hydrated.  Walk some to prevent atelectasis and suggest monitoring pulse oximetry.  Call if symptoms worsen or if she has any questions whatsoever.  Suggest quarantining for 5 days at home.  May take Tylenol for fever.

## 2022-08-22 NOTE — Progress Notes (Signed)
Patient Care Team: Margaree Mackintosh, MD as PCP - General (Internal Medicine) Pershing Proud, RN as Oncology Nurse Navigator Donnelly Angelica, RN as Oncology Nurse Navigator Serena Croissant, MD as Consulting Physician (Hematology and Oncology) Serena Croissant, MD as Consulting Physician (Hematology and Oncology)  I connected with [name] on 08/22/22 at 11:27 AM by video enabled telemedicine visit and verified that I am speaking with the correct person using two identifiers.   I discussed the limitations, risks, security and privacy concerns of performing an evaluation and management service by telemedicine and the availability of in-person appointments. I also discussed with the patient that there may be a patient responsible charge related to this service. The patient expressed understanding and agreed to proceed.   Other persons participating in the visit and their role in the encounter: Medical scribe, Doylene Bode  Patient's location: Home  Provider's location: Clinic   I provided 10 minutes of face-to-face video visit time during this encounter, and > 50% was spent counseling as documented under my assessment & plan. She is identified using two identifiers, [name], a patient of this practice. She is in her home and I am in my practice. She is agreeable to using this format today.  Chief Complaint: Sore throat, congestion   Subjective:    Patient ID: [name] , Female    DOB: October 14, 1960, 62 y.o.    MRN: [id number]   62 y.o. Female presents today for sore throat, headache, congestion, fatigue. She started feeling ill several days ago and symptoms became worse on 08/20/22. Taking Sudafed, Tylenol. She is no longer on chemotherapy. Tested positive for Covid-19 yesterday.   Past Medical History:  Diagnosis Date   Anxiety    Allergy induced asthma   Arthritis    Asthma    Breast cancer (HCC)    Crohn disease (HCC)    Depression    GERD (gastroesophageal  reflux disease)    History of methicillin resistant staphylococcus aureus (MRSA)    pt denies this.   History of radiation therapy    Left Breast- 05/09/22-06/11/22-Dr. Antony Blackbird   Multiple thyroid nodules    Pneumonia    Sleep apnea    CPAP   Thyroid cancer (HCC)      Family History  Problem Relation Age of Onset   Alcohol abuse Mother    Alcohol abuse Sister    Angioedema Neg Hx    Allergic rhinitis Neg Hx    Asthma Neg Hx    Atopy Neg Hx    Eczema Neg Hx    Immunodeficiency Neg Hx    Urticaria Neg Hx    Colon cancer Neg Hx    Stomach cancer Neg Hx    Esophageal cancer Neg Hx    Colon polyps Neg Hx     Social hx: Married. Husband is a Development worker, international aid.   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 Ventral hernia repair with mesh 2022. Hx sleep apnea, hx of GERD, Hx left breast  seed lumpectomy at time of thyroid surgery but had re-excision  Review of Systems  Constitutional:  Positive for malaise/fatigue. Negative for fever.  HENT:  Positive for congestion and sore throat.   Eyes:  Negative for blurred vision.  Respiratory:  Negative for cough and shortness of breath.   Cardiovascular:  Negative for chest pain, palpitations and leg swelling.  Gastrointestinal:  Negative for vomiting.  Musculoskeletal:  Negative for back pain.  Skin:  Negative  for rash.  Neurological:  Positive for headaches. Negative for loss of consciousness.        Objective:   Vitals: Pulse 77   Temp 100 F (37.8 C)   LMP 01/10/2014    Physical Exam Vitals and nursing note reviewed.  Constitutional:      General: She is not in acute distress.    Appearance: Normal appearance. She is not toxic-appearing.  HENT:     Head: Normocephalic and atraumatic.  Pulmonary:     Effort: Pulmonary effort is normal.  Skin:    General: Skin is warm and dry.  Neurological:     Mental Status: She is alert and oriented to person, place, and time. Mental status is at  baseline.  Psychiatric:        Mood and Affect: Mood normal.        Behavior: Behavior normal.        Thought Content: Thought content normal.        Judgment: Judgment normal.      Results:   Studies obtained and personally reviewed by me:   Labs:       Component Value Date/Time   NA 139 04/15/2022 0827   K 3.8 04/15/2022 0827   CL 108 04/15/2022 0827   CO2 23 04/15/2022 0827   GLUCOSE 96 04/15/2022 0827   BUN 16 04/15/2022 0827   CREATININE 0.64 04/15/2022 0827   CREATININE 0.74 08/30/2021 0939   CALCIUM 9.5 04/15/2022 0827   PROT 6.8 04/15/2022 0827   ALBUMIN 4.2 04/15/2022 0827   AST 19 04/15/2022 0827   ALT 21 04/15/2022 0827   ALKPHOS 53 04/15/2022 0827   BILITOT 0.4 04/15/2022 0827   GFRNONAA >60 04/15/2022 0827   GFRNONAA 96 03/02/2020 1100   GFRAA 112 03/02/2020 1100     Lab Results  Component Value Date   WBC 8.0 04/15/2022   HGB 10.0 (L) 04/15/2022   HCT 29.6 (L) 04/15/2022   MCV 94.0 04/15/2022   PLT 275 04/15/2022    Lab Results  Component Value Date   CHOL 275 (H) 08/30/2021   HDL 96 08/30/2021   LDLCALC 160 (H) 08/30/2021   TRIG 84 08/30/2021   CHOLHDL 2.9 08/30/2021    No results found for: "HGBA1C"   Lab Results  Component Value Date   TSH 0.17 (L) 07/30/2022      Assessment & Plan:   Acute Covid-19 infection: GFR >60 on 04/15/22. Prescribed Paxlovid regular strength, Hycodan syrup 1 tsp every 8 hours as needed for cough. Do not take Halcion while taking Paxlovid. Hydrate well, eat well, and walk regularly. Advised to quarantine for five days.   Hx of Chron's disease  Hx of breast cancer  Time spent with this visit including chart review, interviewing patient and e-scribing medications is 15 minutes.  I,Alexander Ruley,acting as a Neurosurgeon for Margaree Mackintosh, MD.,have documented all relevant documentation on the behalf of Margaree Mackintosh, MD,as directed by  Margaree Mackintosh, MD while in the presence of Margaree Mackintosh, MD.   I, Margaree Mackintosh, MD, have reviewed all documentation for this visit. The documentation on 09/02/22 for the exam, diagnosis, procedures, and orders are all accurate and complete.

## 2022-08-28 ENCOUNTER — Other Ambulatory Visit: Payer: 59

## 2022-08-29 ENCOUNTER — Other Ambulatory Visit (HOSPITAL_COMMUNITY): Payer: Self-pay

## 2022-08-30 ENCOUNTER — Other Ambulatory Visit (HOSPITAL_COMMUNITY): Payer: Self-pay

## 2022-08-30 ENCOUNTER — Telehealth (INDEPENDENT_AMBULATORY_CARE_PROVIDER_SITE_OTHER): Payer: 59 | Admitting: Internal Medicine

## 2022-08-30 ENCOUNTER — Ambulatory Visit: Admission: RE | Admit: 2022-08-30 | Payer: 59 | Source: Ambulatory Visit

## 2022-08-30 ENCOUNTER — Telehealth: Payer: Self-pay | Admitting: Internal Medicine

## 2022-08-30 ENCOUNTER — Encounter: Payer: Self-pay | Admitting: Internal Medicine

## 2022-08-30 VITALS — HR 85 | Temp 99.0°F

## 2022-08-30 DIAGNOSIS — Z79620 Long term (current) use of immunosuppressive biologic: Secondary | ICD-10-CM

## 2022-08-30 DIAGNOSIS — R051 Acute cough: Secondary | ICD-10-CM

## 2022-08-30 DIAGNOSIS — U071 COVID-19: Secondary | ICD-10-CM | POA: Diagnosis not present

## 2022-08-30 MED ORDER — ALBUTEROL SULFATE HFA 108 (90 BASE) MCG/ACT IN AERS
2.0000 | INHALATION_SPRAY | Freq: Four times a day (QID) | RESPIRATORY_TRACT | 3 refills | Status: AC | PRN
  Filled 2022-08-30: qty 6.7, 25d supply, fill #0

## 2022-08-30 MED ORDER — LEVOFLOXACIN 500 MG PO TABS
500.0000 mg | ORAL_TABLET | Freq: Every day | ORAL | 0 refills | Status: DC
  Filled 2022-08-30: qty 7, 7d supply, fill #0

## 2022-08-30 MED ORDER — METHYLPREDNISOLONE 4 MG PO TBPK
ORAL_TABLET | ORAL | 1 refills | Status: DC
  Filled 2022-08-30: qty 21, 6d supply, fill #0
  Filled 2022-08-30: qty 21, fill #0

## 2022-08-30 MED ORDER — METHYLPREDNISOLONE 4 MG PO TABS
4.0000 mg | ORAL_TABLET | Freq: Every day | ORAL | 1 refills | Status: DC
Start: 2022-08-30 — End: 2022-08-30
  Filled 2022-08-30: qty 21, 6d supply, fill #0
  Filled 2022-08-30: qty 21, 21d supply, fill #0

## 2022-08-30 NOTE — Telephone Encounter (Signed)
[name] called today she is not any better, she was positive for COVID on 08/21/2022, she stills has bad headache, coughing, hurts to blow her nose and cough.I set her up for video visit at 12:30.

## 2022-08-30 NOTE — Progress Notes (Signed)
Patient Care Team: Margaree Mackintosh, MD as PCP - General (Internal Medicine) Pershing Proud, RN as Oncology Nurse Navigator Donnelly Angelica, RN as Oncology Nurse Navigator Serena Croissant, MD as Consulting Physician (Hematology and Oncology) Serena Croissant, MD as Consulting Physician (Hematology and Oncology)  I connected with Nathen May Drummonds on 08/30/22 at 12:26 PM by video enabled telemedicine visit and verified that I am speaking with the correct person using two identifiers.   I discussed the limitations, risks, security and privacy concerns of performing an evaluation and management service by telemedicine and the availability of in-person appointments. I also discussed with the patient that there may be a patient responsible charge related to this service. The patient expressed understanding and agreed to proceed.   Other persons participating in the visit and their role in the encounter: Medical scribe, Doylene Bode  Patient's location: Home  Provider's location: Clinic   I provided 15 minutes of time during this encounter, including interview, medical decision making and chart review . She is identified using two identifiers, Kanyon Arbuthnot, a patient in this practice. She is in her home and I am at my office. She is agreeable to using this format today.  Chief Complaint: sinus congestion, headache   Subjective:    Patient ID: [name] , Female    DOB: [birth year], 62 y.o.    MRN: [id number]   62 y.o. Female presents today for fatigue, sinus congestion, headache, cough with yellow/green sputum for about 8 days. Reports positive at-home Covid-19 test. She is taking anastrozole for breast cancer. She is having some breathing difficulty. She has an albuterol inhaler.  Past Medical History:  Diagnosis Date   Anxiety    Allergy induced asthma   Arthritis    Asthma    Breast cancer (HCC)    Crohn disease (HCC)    Depression    GERD (gastroesophageal reflux disease)     History of methicillin resistant staphylococcus aureus (MRSA)    pt denies this.   History of radiation therapy    Left Breast- 05/09/22-06/11/22-Dr. Antony Blackbird   Multiple thyroid nodules    Pneumonia    Sleep apnea    CPAP   Thyroid cancer (HCC)      Family History  Problem Relation Age of Onset   Alcohol abuse Mother    Alcohol abuse Sister    Angioedema Neg Hx    Allergic rhinitis Neg Hx    Asthma Neg Hx    Atopy Neg Hx    Eczema Neg Hx    Immunodeficiency Neg Hx    Urticaria Neg Hx    Colon cancer Neg Hx    Stomach cancer Neg Hx    Esophageal cancer Neg Hx    Colon polyps Neg Hx     Social Hx: Married. 2 sons. Does not smoke. She is a Futures trader. Has 4 year college degree. Husband is a Development worker, international aid.     Review of Systems  Constitutional:  Positive for malaise/fatigue. Negative for fever.  HENT:  Positive for congestion (Sinus).   Eyes:  Negative for blurred vision.  Respiratory:  Positive for cough, sputum production (Green/yellow) and shortness of breath.   Cardiovascular:  Negative for chest pain, palpitations and leg swelling.  Gastrointestinal:  Negative for vomiting.  Musculoskeletal:  Negative for back pain.  Skin:  Negative for rash.  Neurological:  Positive for headaches. Negative for loss of consciousness.        Objective:   Vitals:  Pulse 85   Temp 99 F (37.2 C)   LMP 01/10/2014    Physical Exam Vitals and nursing note reviewed.  Constitutional:      General: She is not in acute distress.    Appearance: Normal appearance. She is ill-appearing. She is not toxic-appearing.     Comments: Coughing frequently during the interview. Face is pale.  HENT:     Head: Normocephalic and atraumatic.  Pulmonary:     Effort: Pulmonary effort is normal.  Skin:    General: Skin is warm and dry.  Neurological:     Mental Status: She is alert and oriented to person, place, and time. Mental status is at baseline.  Psychiatric:        Mood and  Affect: Mood normal.        Behavior: Behavior normal.        Thought Content: Thought content normal.        Judgment: Judgment normal.       Results:   Studies obtained and personally reviewed by me:   Labs:       Component Value Date/Time   NA 139 04/15/2022 0827   K 3.8 04/15/2022 0827   CL 108 04/15/2022 0827   CO2 23 04/15/2022 0827   GLUCOSE 96 04/15/2022 0827   BUN 16 04/15/2022 0827   CREATININE 0.64 04/15/2022 0827   CREATININE 0.74 08/30/2021 0939   CALCIUM 9.5 04/15/2022 0827   PROT 6.8 04/15/2022 0827   ALBUMIN 4.2 04/15/2022 0827   AST 19 04/15/2022 0827   ALT 21 04/15/2022 0827   ALKPHOS 53 04/15/2022 0827   BILITOT 0.4 04/15/2022 0827   GFRNONAA >60 04/15/2022 0827   GFRNONAA 96 03/02/2020 1100   GFRAA 112 03/02/2020 1100     Lab Results  Component Value Date   WBC 8.0 04/15/2022   HGB 10.0 (L) 04/15/2022   HCT 29.6 (L) 04/15/2022   MCV 94.0 04/15/2022   PLT 275 04/15/2022    Lab Results  Component Value Date   CHOL 275 (H) 08/30/2021   HDL 96 08/30/2021   LDLCALC 160 (H) 08/30/2021   TRIG 84 08/30/2021   CHOLHDL 2.9 08/30/2021    No results found for: "HGBA1C"   Lab Results  Component Value Date   TSH 0.17 (L) 07/30/2022      Assessment & Plan:   Acute Covid-19 infection: ordered stat CXR. Prescribed levofloxacin 500 mg daily for seven days, Medrol dosepak tapering course take as directed. Refilled albuterol inhaler. Can take Hycodan if needed for cough. Rest well, drink plenty of fluids, and walk around. Advised to quarantine for 5 days. Will call with results. Contact us if symptoms worsen or fail to improve.  CXR today showed no evidence of active cardiopulmonary disease.     I,Alexander Ruley,acting as a Neurosurgeon for Margaree Mackintosh, MD.,have documented all relevant documentation on the behalf of Margaree Mackintosh, MD,as directed by  Margaree Mackintosh, MD while in the presence of Margaree Mackintosh, MD.   I, Margaree Mackintosh, MD, have reviewed  all documentation for this visit. The documentation on 09/13/22 for the exam, diagnosis, procedures, and orders are all accurate and complete.

## 2022-09-03 ENCOUNTER — Other Ambulatory Visit (HOSPITAL_COMMUNITY): Payer: Self-pay

## 2022-09-04 ENCOUNTER — Other Ambulatory Visit (INDEPENDENT_AMBULATORY_CARE_PROVIDER_SITE_OTHER): Payer: 59

## 2022-09-04 DIAGNOSIS — E890 Postprocedural hypothyroidism: Secondary | ICD-10-CM | POA: Diagnosis not present

## 2022-09-05 ENCOUNTER — Other Ambulatory Visit: Payer: Self-pay | Admitting: *Deleted

## 2022-09-05 ENCOUNTER — Encounter: Payer: Self-pay | Admitting: Adult Health

## 2022-09-05 ENCOUNTER — Inpatient Hospital Stay: Payer: 59 | Attending: Hematology and Oncology | Admitting: Adult Health

## 2022-09-05 VITALS — BP 118/72 | HR 77 | Temp 97.7°F | Resp 17 | Wt 134.0 lb

## 2022-09-05 DIAGNOSIS — Z79811 Long term (current) use of aromatase inhibitors: Secondary | ICD-10-CM | POA: Diagnosis not present

## 2022-09-05 DIAGNOSIS — C50212 Malignant neoplasm of upper-inner quadrant of left female breast: Secondary | ICD-10-CM | POA: Insufficient documentation

## 2022-09-05 DIAGNOSIS — Z170 Estrogen receptor positive status [ER+]: Secondary | ICD-10-CM | POA: Insufficient documentation

## 2022-09-05 DIAGNOSIS — Z85850 Personal history of malignant neoplasm of thyroid: Secondary | ICD-10-CM | POA: Diagnosis not present

## 2022-09-05 NOTE — Progress Notes (Signed)
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) 0/2 lymph nodes)   01/16/2022 Cancer Staging   Staging form: Breast, AJCC 8th Edition - Clinical: Stage IA (cT1c, cN0, cM0, G2, ER+, PR+, HER2-) - Signed by Serena Croissant, MD on 01/16/2022 Histologic grading system: 3 grade system   01/22/2022 Surgery   Margin reexcision surgery: Benign margins, sentinel lymph node 0/1   01/23/2022 Oncotype testing   Recurrence score: 31 (19% distant recurrence risk at 9 years)   02/11/2022 -  Chemotherapy   Patient is on Treatment Plan : BREAST TC q21d     05/10/2022 - 06/07/2022 Radiation Therapy   Adjuvant radiation   06/2022 -  Anti-estrogen oral therapy   Anastrozole daily     INTERVAL HISTORY:  [name] to review her survivorship care plan detailing her treatment course for breast cancer, as well as monitoring long-term side effects of that treatment, education regarding health maintenance, screening, and overall wellness and health promotion.     Overall, [name] reports feeling quite well.  She is taking Anastrozole daily with good tolerance.  She initially was experiencing arthralgias, however notes that those have significantly improved.   REVIEW OF SYSTEMS:  Review of Systems  Constitutional:  Negative for appetite change, chills, fatigue, fever and unexpected weight change.  HENT:   Negative for hearing loss, lump/mass and trouble swallowing.   Eyes:  Negative for eye problems and icterus.  Respiratory:  Negative for chest tightness, cough and shortness of  breath.   Cardiovascular:  Negative for chest pain, leg swelling and palpitations.  Gastrointestinal:  Negative for abdominal distention, abdominal pain, constipation, diarrhea, nausea and vomiting.  Endocrine: Negative for hot flashes.  Genitourinary:  Negative for difficulty urinating.   Musculoskeletal:  Negative for arthralgias.  Skin:  Negative for itching and rash.  Neurological:  Negative for dizziness, extremity weakness, headaches and numbness.  Hematological:  Negative for adenopathy. Does not bruise/bleed easily.  Psychiatric/Behavioral:  Negative for depression. The patient is not nervous/anxious.   Breast: Denies any new nodularity, masses, tenderness, nipple changes, or nipple discharge.       PAST MEDICAL/SURGICAL HISTORY:  Past Medical History:  Diagnosis Date   Anxiety    Allergy induced asthma   Arthritis    Asthma    Breast cancer (HCC)    Crohn disease (HCC)    Depression    GERD (gastroesophageal reflux disease)    History of methicillin resistant staphylococcus aureus (MRSA)    pt denies this.   History of radiation therapy    Left Breast- 05/09/22-06/11/22-Dr. Antony Blackbird   Multiple thyroid nodules    Pneumonia    Sleep apnea    CPAP   Thyroid cancer [facility])    Past Surgical History:  Procedure Laterality Date   ANAL FISSURE REPAIR  2004   BREAST LUMPECTOMY WITH RADIOACTIVE SEED LOCALIZATION Left 01/01/2022   Procedure: LEFT BREAST SEED LUMPECTOMY;  Surgeon: Harriette Bouillon, MD;  Location: [facility] OR;  Service: General;  Laterality: Left;   CESAREAN SECTION     COLONOSCOPY     INSERTION OF MESH N/A 07/26/2020  Procedure: INSERTION OF MESH;  Surgeon: Axel Filler, MD;  Location: [facility] OR;  Service: General;  Laterality: N/A;   LIGAMENT REPAIR Right 11/23/2015   Procedure: right index radial collateral LIGAMENT REPAIR;  Surgeon: Betha Loa, MD;  Location: [facility];  Service: Orthopedics;  Laterality: Right;   PORTACATH PLACEMENT Right  02/07/2022   Procedure: INSERTION PORT-A-CATH;  Surgeon: Harriette Bouillon, MD;  Location: [facility] OR;  Service: General;  Laterality: Right;   RE-EXCISION OF BREAST LUMPECTOMY Left 01/22/2022   Procedure: RE-EXCISION OF LEFT BREAST LUMPECTOMY;  Surgeon: Harriette Bouillon, MD;  Location: [facility];  Service: General;  Laterality: Left;   SENTINEL NODE BIOPSY Left 01/22/2022   Procedure: LEFT SENTINEL NODE BIOPSY;  Surgeon: Harriette Bouillon, MD;  Location: [facility];  Service: General;  Laterality: Left;   THYROIDECTOMY N/A 01/01/2022   Procedure: TOTAL THYROIDECTOMY WITH LIMITED LYMPH NODE DISSECTION;  Surgeon: Darnell Level, MD;  Location: [facility] OR;  Service: General;  Laterality: N/A;   UPPER ENDOSCOPY W/ ESOPHAGEAL MANOMETRY  10/2021   VENTRAL HERNIA REPAIR N/A 07/26/2020   Procedure: LAPAROSCOPIC VENTRAL HERNIA REPAIR WITH MESH;  Surgeon: Axel Filler, MD;  Location: [facility] OR;  Service: General;  Laterality: N/A;     ALLERGIES:  Allergies  Allergen Reactions   Chlorhexidine Gluconate Itching   Gabapentin Itching   Hydrocodone Itching   Oxycodone Hcl Itching    NDC [id number]  NDC [id number]:[id number]   Betadine [Povidone Iodine] Rash     CURRENT MEDICATIONS:  Outpatient Encounter Medications as of 09/05/2022  Medication Sig Note   adalimumab (HUMIRA, 2 SYRINGE,) 40 MG/0.4ML prefilled syringe Inject 0.4 mLs (40 mg total) under the skin every 14 (fourteen) days.    albuterol (VENTOLIN HFA) 108 (90 Base) MCG/ACT inhaler Inhale 2 puffs into the lungs every 6 (six) hours as needed for wheezing or shortness of breath.    anastrozole (ARIMIDEX) 1 MG tablet Take 1 tablet (1 mg total) by mouth daily.    ascorbic acid (VITAMIN C) 500 MG tablet Take 500 mg by mouth 2 (two) times a week.    bimatoprost (LUMIGAN) 0.01 % SOLN Instill 1 drop into both eyes at bedtime    busPIRone (BUSPAR) 7.5 MG tablet Take 1 tablet (7.5 mg total) by mouth 2 (two) times daily.    DULoxetine  (CYMBALTA) 60 MG capsule Take 1 capsule (60 mg total) by mouth daily.    levothyroxine (SYNTHROID) 50 MCG tablet Take 2 tablets (100 mcg total) by mouth daily before breakfast.    omeprazole (PRILOSEC) 20 MG capsule Take 1 capsule (20 mg total) by mouth daily.    celecoxib (CELEBREX) 100 MG capsule Take 1 capsule (100 mg total) by mouth in the morning and at bedtime. (Patient not taking: Reported on 09/05/2022) 02/06/2022: On hold for procedure   cetirizine (ZYRTEC) 10 MG tablet Take 10 mg by mouth in the morning. (Patient not taking: Reported on 09/05/2022)    fluocinonide ointment (LIDEX) 0.05 % Apply 1 application  topically daily as needed (irritation). (Patient not taking: Reported on 09/05/2022)    HYDROcodone bit-homatropine (HYCODAN) 5-1.5 MG/5ML syrup Take 5 mLs by mouth every 8 (eight) hours as needed for cough. (Patient not taking: Reported on 09/05/2022)    levofloxacin (LEVAQUIN) 500 MG tablet Take 1 tablet (500 mg total) by mouth daily for 7 days.    lidocaine-prilocaine (EMLA) cream Apply to affected area once 02/06/2022: Has not started   methylPREDNISolone (MEDROL DOSEPAK) 4 MG TBPK tablet Take  as directed on package.    triazolam (HALCION) 0.25 MG tablet Take 1 tablet (0.25 mg total) by mouth 1.5 hours before your dental appointment    [DISCONTINUED] amoxicillin (AMOXIL) 500 MG capsule Take 1 capsule (500 mg total) by mouth 3 (three) times daily until gone    Facility-Administered Encounter Medications as of 09/05/2022  Medication   0.9 %  sodium chloride infusion     ONCOLOGIC FAMILY HISTORY:  Family History  Problem Relation Age of Onset   Alcohol abuse Mother    Alcohol abuse Sister    Angioedema Neg Hx    Allergic rhinitis Neg Hx    Asthma Neg Hx    Atopy Neg Hx    Eczema Neg Hx    Immunodeficiency Neg Hx    Urticaria Neg Hx    Colon cancer Neg Hx    Stomach cancer Neg Hx    Esophageal cancer Neg Hx    Colon polyps Neg Hx      SOCIAL HISTORY:  Social History    Socioeconomic History   Marital status: Married    Spouse name: Not on file   Number of children: 2   Years of education: Not on file   Highest education level: Not on file  Occupational History   Occupation: Self employed  Tobacco Use   Smoking status: Never    Passive exposure: Never   Smokeless tobacco: Never  Vaping Use   Vaping status: Never Used  Substance and Sexual Activity   Alcohol use: Yes    Alcohol/week: 10.0 standard drinks of alcohol    Types: 10 Glasses of wine per week    Comment: 10 glasses/week   Drug use: No   Sexual activity: Yes    Birth control/protection: Post-menopausal  Other Topics Concern   Not on file  Social History Narrative   Not on file   Social Determinants of Health   Financial Resource Strain: Not on file  Food Insecurity: No Food Insecurity (04/29/2022)   Hunger Vital Sign    Worried About Running Out of Food in the Last Year: Never true    Ran Out of Food in the Last Year: Never true  Transportation Needs: No Transportation Needs (04/29/2022)   PRAPARE - Administrator, Civil Service (Medical): No    Lack of Transportation (Non-Medical): No  Physical Activity: Not on file  Stress: Not on file  Social Connections: Not on file  Intimate Partner Violence: Not on file     OBSERVATIONS/OBJECTIVE:  BP 118/72 (BP Location: Right Arm, Patient Position: Sitting)   Pulse 77   Temp 97.7 F (36.5 C) (Oral)   Resp 17   Wt 134 lb (60.8 kg)   LMP 01/10/2014   SpO2 98%   BMI 24.51 kg/m  GENERAL: Patient is a well appearing female in no acute distress HEENT:  Sclerae anicteric.  Oropharynx clear and moist. No ulcerations or evidence of oropharyngeal candidiasis. Neck is supple.  NODES:  No cervical, supraclavicular, or axillary lymphadenopathy palpated.  BREAST EXAM: Left breast status postlumpectomy and radiation no sign of local recurrence right breast is benign LUNGS:  Clear to auscultation bilaterally.  No wheezes or  rhonchi. HEART:  Regular rate and rhythm. No murmur appreciated. ABDOMEN:  Soft, nontender.  Positive, normoactive bowel sounds. No organomegaly palpated. MSK:  No focal spinal tenderness to palpation. Full range of motion bilaterally in the upper extremities. EXTREMITIES:  No peripheral edema.   SKIN:  Clear with no obvious  rashes or skin changes. No nail dyscrasia. NEURO:  Nonfocal. Well oriented.  Appropriate affect.   LABORATORY DATA:  None for this visit.  DIAGNOSTIC IMAGING:  None for this visit.      ASSESSMENT AND PLAN:  Ms.. [name] is a pleasant 62 y.o. 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  She presents to the Survivorship Clinic for our initial meeting and routine follow-up post-completion of treatment for breast cancer.    1. Stage IA left breast cancer:  [name] is continuing to recover from definitive treatment for breast cancer. She will follow-up with her medical oncologist, Dr.  Pamelia Hoit in 6 months with history and physical exam per surveillance protocol.  She will continue her anti-estrogen therapy with Anastrozole. Thus far, she is tolerating the Anastrozole well, with minimal side effects. Her mammogram is due 12/2022; orders placed today.   Today, a comprehensive survivorship care plan and treatment summary was reviewed with the patient today detailing her breast cancer diagnosis, treatment course, potential late/long-term effects of treatment, appropriate follow-up care with recommendations for the future, and patient education resources.  A copy of this summary, along with a letter will be sent to the patient's primary care provider via mail/fax/In Basket message after today's visit.    2. Bone health:  Given [name]'s age/history of breast cancer and her current  treatment regimen including anti-estrogen therapy with Anastrozole, she is at risk for bone demineralization.  Her last DEXA scan was 04/2020--in the process of obtaining results.  Repeat bone density testing is indicated, and orders were placed at today's visit.  She was given education on specific activities to promote bone health.  3. Cancer screening:  Due to [name]'s history and her age, she should receive screening for skin cancers, colon cancer, and gynecologic cancers.  The information and recommendations are listed on the patient's comprehensive care plan/treatment summary and were reviewed in detail with the patient.    4. Health maintenance and wellness promotion: [name] was encouraged to consume 5-7 servings of fruits and vegetables per day. We reviewed the "Nutrition Rainbow" handout.  She was also encouraged to engage in moderate to vigorous exercise for 30 minutes per day most days of the week.  She was instructed to limit her alcohol consumption and continue to abstain from tobacco use.     5. Support services/counseling: It is not uncommon for this period of the patient's cancer care trajectory to be one of many emotions and stressors.   She was given information regarding our available services and encouraged to contact me with any questions or for help enrolling in any of our support group/programs.    Follow up instructions:    -Return to cancer center in 6 months for f/u with Dr. Pamelia Hoit  -Mammogram due in 12/2022 -Bone density testing due -She is welcome to return back to the Survivorship Clinic at any time; no additional follow-up needed at this time.  -Consider referral back to survivorship as a long-term survivor for continued surveillance  The patient was provided an opportunity to ask questions and all were answered. The patient agreed with the plan and demonstrated an understanding of the instructions.   Total encounter time:55 minutes*in face-to-face visit time,  chart review, lab review, care coordination, order entry, and documentation of the encounter time.    Lillard Anes, NP 09/05/22 12:19 PM Medical  Oncology and Hematology [facility] [address] [place], [place] [address] Tel. [phone]    Fax. [phone]  *Total Encounter Time as defined by the Centers for Medicare and Medicaid Services includes, in addition to the face-to-face time of a patient visit (documented in the note above) non-face-to-face time: obtaining and reviewing outside history, ordering and reviewing medications, tests or procedures, care coordination (communications with other health care professionals or caregivers) and documentation in the medical record.

## 2022-09-06 LAB — TSH: TSH: 2.27 u[IU]/mL (ref 0.35–5.50)

## 2022-09-06 LAB — T4, FREE: Free T4: 1.28 ng/dL (ref 0.60–1.60)

## 2022-09-11 ENCOUNTER — Telehealth: Payer: Self-pay | Admitting: Adult Health

## 2022-09-11 NOTE — Telephone Encounter (Signed)
Patient is aware of scheduled appointment times/dates

## 2022-09-13 ENCOUNTER — Encounter: Payer: Self-pay | Admitting: Internal Medicine

## 2022-09-13 NOTE — Patient Instructions (Signed)
Have ordered stat chest x-ray.  Take Levaquin 500 milligrams daily by mouth for 7 days.  Take Medrol Dosepak and tapering course by mouth as directed.  Albuterol inhaler refilled.  May take Hycodan syrup sparingly if needed for cough.  Advised to quarantine at home for 5 days.  Stay well-hydrated, walk some to prevent atelectasis.  Call us if symptoms worsen or fail to improve within a few days.  Addendum: Patient contacted regarding chest x-ray results showing no evidence of active cardiopulmonary disease.

## 2022-09-17 ENCOUNTER — Telehealth: Payer: Self-pay | Admitting: Acute Care

## 2022-09-17 NOTE — Progress Notes (Signed)
Patient Care Team: Margaree Mackintosh, MD as PCP - General (Internal Medicine) Serena Croissant, MD as Consulting Physician (Hematology and Oncology) Antony Blackbird, MD as Consulting Physician (Radiation Oncology) Harriette Bouillon, MD as Consulting Physician (General Surgery)  Visit Date: 09/24/22  Subjective:    Patient ID: [name] , Female   DOB: 09-Feb-1960, 62 y.o.    MRN: [id number]   62 y.o. Female presents today for annual comprehensive physical exam. History of anxiety and depression, asthma, arthritis, breast cancer, Crohn's disease, GERD, MRSA, thyroid nodules, pneumonia, sleep apnea with CPAP, thyroid cancer, hyperlipidemia.  She had a Covid-19 infection on 08/21/22. Started antibiotic and steroids on 08/30/22 and has not fully recovered. Her energy is decreased and she still has a cough with phlegm. Right ear pain has improved after experiencing popping and crackling.  History of hyperlipidemia. She is not currently taking medication. CHOL elevated at 250, LDL elevated at 149.  History of Stage 1A left breast invasive ductal carcinoma, ER+/PR+/HER2-, diagnosed in 01/2022, treated with lumpectomy, adjuvant chemotherapy, adjuvant radiation therapy, and anti-estrogen therapy with Anastrozole beginning in 06/2022. She is currently taking anastrozole 1 mg daily. Followed by oncologist, Dr. Pamelia Hoit.  History of anxiety and depression treated with buspirone 7.5 mg twice daily.  History of hypothyroidism treated with levothyroxine 100 mcg before breakfast.  History of GERD treated with omeprazole 20 mg daily.  History of asthma treated with albuterol inhaler as needed.  Seen by Dr. Ayesha Mohair for musculoskeletal pain.  In March 2023 had issues of back and neck pain and some right CMC joint pain after clipping some grass.  She is taking vitamin D supplement.  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  Was treated with prednisone in March 2023.  Has taken Zanaflex  in the past.  In July 2023 had left AC sprain he was given an injection with improvement.  Lumbar radiculopathy responded to osteopathic manipulation.  Was seen by Dr. Terrilee Files again in July 2023 after falling off of a bike 3 weeks previously with resultant back and neck pain.  Left AC joint injected.   History of allergic rhinitis and recurrent sinusitis.   Still followed at [facility] for Crohn's disease.  Still on Humira.  Is also on Cymbalta which helps pain and Celebrex.  Currently taking high-dose vitamin D supplement per Dr. Katrinka Blazing.  Dr. Dione Booze has her on Lumigan with history of open-angle glaucoma.  She is on Humira 40 mg injected every 14 days.   History of sleep apnea but not able to wear CPAP device if significantly nasally congested.  Has seen Dr. Vassie Loll at [facility].   History of MRSA in the remote past.  GYN is Dr. Juliene Pina at [facility].   Dr. Betha Loa performed repair of right index collateral ligament November 2017.   In September 2017 she had abscess of right fourth finger requiring surgical drainage and culture grew Staph aureus sensitive to Bactrim   History of epidermoid cyst removed from her back February 2019 at [facility].   Biopsy of 2.2 cm right-mid thyroid nodule completed 11/14/21. Findings consistent with papillary carcinoma.  Glucose normal. Kidney and liver functions normal. Electrolytes normal. Blood proteins normal. CBC normal. TSH at 0.66.  Last colonoscopy in December 2021 and showed no endoscopic inflammation. Biopsies showed no evidence of microscopic colitis.  Social history: Married.  2 sons.  Does not smoke.  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  Husband is a cardiologist.  Her nephew resides with the family as well.   Family history: Stroke and alcoholism in mother and sister.  Mother is deceased.   Past Medical History:  Diagnosis Date   Anxiety    Allergy induced asthma   Arthritis     Asthma    Breast cancer (HCC)    Crohn disease (HCC)    Depression    GERD (gastroesophageal reflux disease)    History of methicillin resistant staphylococcus aureus (MRSA)    pt denies this.   History of radiation therapy    Left Breast- 05/09/22-06/11/22-Dr. Antony Blackbird   Multiple thyroid nodules    Pneumonia    Port-A-Cath in place 03/25/2022   Sleep apnea    CPAP   Thyroid cancer (HCC)      Family History  Problem Relation Age of Onset   Alcohol abuse Mother    Alcohol abuse Sister    Angioedema Neg Hx    Allergic rhinitis Neg Hx    Asthma Neg Hx    Atopy Neg Hx    Eczema Neg Hx    Immunodeficiency Neg Hx    Urticaria Neg Hx    Colon cancer Neg Hx    Stomach cancer Neg Hx    Esophageal cancer Neg Hx    Colon polyps Neg Hx     Social history: Married.  Husband is a Development worker, international aid.  2 sons.  Does not smoke.  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  Family history: Stroke and alcoholism in mother and sister.  Additional history: Epidermoid cyst removed from her back February 2019 in [facility] dermatology.  Had colonoscopy in 2019.  Had abscess of right fourth finger requiring surgical drainage.  Culture grew Staph aureus sensitive to Bactrim in 2017.  History of sleep apnea but not able to wear CPAP device if significantly nasally congested.  Has been seen at [facility] pulmonary.  History of open angle glaucoma treated by ophthalmologist.  History of Crohn's disease treated with Humira.  Cymbalta helps musculoskeletal pain as well as Celebrex.  History of allergic rhinitis and recurrent sinusitis.  History of MRSA in the remote past.  History of repair of right index finger collateral ligament by Dr. Betha Loa November 2017   Review of Systems  Constitutional:  Positive for malaise/fatigue. Negative for chills, fever and weight loss.  HENT:  Negative for hearing loss, sinus pain and sore throat.   Respiratory:  Positive for cough and sputum production.  Negative for hemoptysis and shortness of breath.   Cardiovascular:  Negative for chest pain, palpitations, leg swelling and PND.  Gastrointestinal:  Negative for abdominal pain, constipation, diarrhea, heartburn, nausea and vomiting.  Genitourinary:  Negative for dysuria, frequency and urgency.  Musculoskeletal:  Negative for back pain, myalgias and neck pain.  Skin:  Negative for itching and rash.  Neurological:  Negative for dizziness, tingling, seizures and headaches.  Endo/Heme/Allergies:  Negative for polydipsia.  Psychiatric/Behavioral:  Negative for depression. The patient is not nervous/anxious.         Objective:   Vitals: BP 110/80   Pulse 85   Ht 5\' 2"  (1.575 m)   Wt 136 lb (61.7 kg)   LMP 01/10/2014   SpO2 93%   BMI 24.87 kg/m    Physical Exam Vitals and nursing note reviewed.  Constitutional:      General: She is not in acute distress.    Appearance: Normal appearance. She is not ill-appearing or toxic-appearing.  HENT:     Head: Normocephalic and atraumatic.     Right Ear: Hearing, ear canal and external ear normal.     Left Ear: Hearing, tympanic membrane, ear canal and external ear normal.     Ears:     Comments: Right TM dull, full. Left TM clear.    Mouth/Throat:     Pharynx: Oropharynx is clear.  Eyes:     Extraocular Movements: Extraocular movements intact.     Pupils: Pupils are equal, round, and reactive to light.  Neck:     Thyroid: No thyroid mass, thyromegaly or thyroid tenderness.     Vascular: No carotid bruit.  Cardiovascular:     Rate and Rhythm: Normal rate and regular rhythm. No extrasystoles are present.    Pulses:          Dorsalis pedis pulses are 1+ on the right side and 1+ on the left side.     Heart sounds: Normal heart sounds. No murmur heard.    No friction rub. No gallop.  Pulmonary:     Effort: Pulmonary effort is normal.     Breath sounds: Normal breath sounds. No decreased breath sounds, wheezing, rhonchi or rales.   Chest:     Chest wall: No mass.  Abdominal:     Palpations: Abdomen is soft. There is no hepatomegaly, splenomegaly or mass.     Tenderness: There is no abdominal tenderness.     Hernia: No hernia is present.  Musculoskeletal:     Cervical back: Normal range of motion.     Right lower leg: No edema.     Left lower leg: No edema.  Lymphadenopathy:     Cervical: No cervical adenopathy.     Upper Body:     Right upper body: No supraclavicular adenopathy.     Left upper body: No supraclavicular adenopathy.  Skin:    General: Skin is warm and dry.  Neurological:     General: No focal deficit present.     Mental Status: She is alert and oriented to person, place, and time. Mental status is at baseline.     Sensory: Sensation is intact.     Motor: Motor function is intact. No weakness.     Deep Tendon Reflexes: Reflexes are normal and symmetric.  Psychiatric:        Attention and Perception: Attention normal.        Mood and Affect: Mood normal.        Speech: Speech normal.        Behavior: Behavior normal.        Thought Content: Thought content normal.        Cognition and Memory: Cognition normal.        Judgment: Judgment normal.       Results:   Studies obtained and personally reviewed by me:  Last colonoscopy in December 2021 and showed no endoscopic inflammation. Biopsies showed no evidence of microscopic colitis.  Labs:       Component Value Date/Time   NA 139 09/23/2022 1207   K 4.4 09/23/2022 1207   CL 103 09/23/2022 1207   CO2 28 09/23/2022 1207   GLUCOSE 93 09/23/2022 1207   BUN 12 09/23/2022 1207   CREATININE 0.62 09/23/2022 1207   CALCIUM 9.8 09/23/2022 1207   PROT 7.0 09/23/2022 1207   ALBUMIN 4.2 04/15/2022 0827   AST 17 09/23/2022 1207   AST 19 04/15/2022 0827   ALT 13 09/23/2022 1207   ALT 21  04/15/2022 0827   ALKPHOS 53 04/15/2022 0827   BILITOT 0.5 09/23/2022 1207   BILITOT 0.4 04/15/2022 0827   GFRNONAA >60 04/15/2022 0827   GFRNONAA 96  03/02/2020 1100   GFRAA 112 03/02/2020 1100     Lab Results  Component Value Date   WBC 4.5 09/23/2022   HGB 12.0 09/23/2022   HCT 36.5 09/23/2022   MCV 91.0 09/23/2022   PLT 325 09/23/2022    Lab Results  Component Value Date   CHOL 250 (H) 09/23/2022   HDL 85 09/23/2022   LDLCALC 149 (H) 09/23/2022   TRIG 67 09/23/2022   CHOLHDL 2.9 09/23/2022    No results found for: "HGBA1C"   Lab Results  Component Value Date   TSH 0.66 09/23/2022      Assessment & Plan:   Persistent Covid-19 infection: administered Depo-Medrol 80 mg IM. Contact us if symptoms worsen or do not improve.  Hyperlipidemia: diet and exercise controlled. CHOL elevated at 250, LDL elevated at 149.  Anxiety and depression: stable with buspirone 7.5 mg twice daily.  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 Gerrit Friends in 2023.  She received radioactive iodine and is followed by Dr. Wyonia Hough and treated with levothyroxine 100 mcg before breakfast.  GERD: treated with omeprazole 20 mg daily.  Allergic rhinitis and asthma: treated with albuterol inhaler as needed.  History of eosin[facility]  Cro[facility]'s disease followed at [facility]  Sleep apnea seen by Pulmonary, Dr. Vassie Loll  Last colonoscopy in December 2021 and showed no endoscopic inflammation. Biopsies showed no evidence of microscopic colitis.  Pelvic exam deferred to gynecologist.  History of ventral hernia repair 2022  Vaccine counseling: UTD on tetanus vaccine. Discussed shingles vaccine.  Return in 1 year or as needed.    I,Alexander Ruley,acting as a Neurosurgeon for Margaree Mackintosh, MD.,have documented all relevant documentation on the behalf of Margaree Mackintosh, MD,as directed by  Margaree Mackintosh, MD while in the presence of Margaree Mackintosh, MD.   I, Margaree Mackintosh, MD, have reviewed all documentation for this visit. The  documentation on 10/02/22 for the exam, diagnosis, procedures, and orders are all accurate and complete.

## 2022-09-17 NOTE — Telephone Encounter (Signed)
Pt tested Pos for Covid on 08/07 and started antibiotics & steroids on 08/16 and is still not better

## 2022-09-19 ENCOUNTER — Ambulatory Visit (INDEPENDENT_AMBULATORY_CARE_PROVIDER_SITE_OTHER): Payer: 59 | Admitting: Pulmonary Disease

## 2022-09-19 ENCOUNTER — Encounter: Payer: Self-pay | Admitting: Pulmonary Disease

## 2022-09-19 VITALS — BP 110/70 | HR 96 | Ht 62.0 in | Wt 135.6 lb

## 2022-09-19 DIAGNOSIS — Z8616 Personal history of COVID-19: Secondary | ICD-10-CM

## 2022-09-19 DIAGNOSIS — J45998 Other asthma: Secondary | ICD-10-CM

## 2022-09-19 MED ORDER — BREZTRI AEROSPHERE 160-9-4.8 MCG/ACT IN AERO: 2.0000 | INHALATION_SPRAY | Freq: Two times a day (BID) | RESPIRATORY_TRACT | Status: DC

## 2022-09-19 NOTE — Addendum Note (Signed)
Addended by: Hedda Slade on: 09/19/2022 05:09 PM   Modules accepted: Orders

## 2022-09-19 NOTE — Progress Notes (Signed)
Synopsis: Referred in Oct 2023 for multiple pulmonary nodules by Margaree Mackintosh, MD  Subjective:   PATIENT ID: [name] GENDER: female DOB: Dec 06, 1960, MRN: [id number]  Chief Complaint  Patient presents with   Acute Visit    Tested positive for covid-19 08/21/22 and still not feeling good, feels like sinus infection.    This is a 62 year old female, past medical history of anxiety, arthritis, asthma, Crohn's disease, sleep apnea on CPAP.  Lifelong non-smoker.Patient had a CT scan of the chest for cardiac scoring completed on 10/15/2021.  This revealed a part solid right lower lobe 2.7 cm diameter nodule with a 1.2 cm solid component suspicious morphology concerning for adenocarcinoma.  Patient was referred for evaluation of pulmonary nodule neck steps for consideration for biopsy and/or surgery.  OV 09/19/2022: recently had covid in August.  She feels like she is getting a little bit better but she had really severe bronchitis with cough and sputum production.  Her respiratory symptoms are better.  She has like a central burning sensation and coughing fits.  Its increased her sensitivity to a few things.  But she does feel better she does have a prior history of allergy induced asthma.    Past Medical History:  Diagnosis Date   Anxiety    Allergy induced asthma   Arthritis    Asthma    Breast cancer (HCC)    Crohn disease (HCC)    Depression    GERD (gastroesophageal reflux disease)    History of methicillin resistant staphylococcus aureus (MRSA)    pt denies this.   History of radiation therapy    Left Breast- 05/09/22-06/11/22-Dr. Antony Blackbird   Multiple thyroid nodules    Pneumonia    Port-A-Cath in place 03/25/2022   Sleep apnea    CPAP   Thyroid cancer (HCC)      Family History  Problem Relation Age of Onset   Alcohol abuse Mother    Alcohol abuse Sister    Angioedema Neg Hx    Allergic rhinitis Neg Hx    Asthma Neg Hx    Atopy Neg Hx    Eczema Neg Hx     Immunodeficiency Neg Hx    Urticaria Neg Hx    Colon cancer Neg Hx    Stomach cancer Neg Hx    Esophageal cancer Neg Hx    Colon polyps Neg Hx      Past Surgical History:  Procedure Laterality Date   ANAL FISSURE REPAIR  2004   BREAST LUMPECTOMY WITH RADIOACTIVE SEED LOCALIZATION Left 01/01/2022   Procedure: LEFT BREAST SEED LUMPECTOMY;  Surgeon: Harriette Bouillon, MD;  Location: [facility] OR;  Service: General;  Laterality: Left;   CESAREAN SECTION     COLONOSCOPY     INSERTION OF MESH N/A 07/26/2020   Procedure: INSERTION OF MESH;  Surgeon: Axel Filler, MD;  Location: [facility] OR;  Service: General;  Laterality: N/A;   LIGAMENT REPAIR Right 11/23/2015   Procedure: right index radial collateral LIGAMENT REPAIR;  Surgeon: Betha Loa, MD;  Location: [facility];  Service: Orthopedics;  Laterality: Right;   PORTACATH PLACEMENT Right 02/07/2022   Procedure: INSERTION PORT-A-CATH;  Surgeon: Harriette Bouillon, MD;  Location: [facility] OR;  Service: General;  Laterality: Right;   RE-EXCISION OF BREAST LUMPECTOMY Left 01/22/2022   Procedure: RE-EXCISION OF LEFT BREAST LUMPECTOMY;  Surgeon: Harriette Bouillon, MD;  Location: [facility];  Service: General;  Laterality: Left;   SENTINEL NODE BIOPSY Left 01/22/2022  Procedure: LEFT SENTINEL NODE BIOPSY;  Surgeon: Harriette Bouillon, MD;  Location: [facility];  Service: General;  Laterality: Left;   THYROIDECTOMY N/A 01/01/2022   Procedure: TOTAL THYROIDECTOMY WITH LIMITED LYMPH NODE DISSECTION;  Surgeon: Darnell Level, MD;  Location: [facility] OR;  Service: General;  Laterality: N/A;   UPPER ENDOSCOPY W/ ESOPHAGEAL MANOMETRY  10/2021   VENTRAL HERNIA REPAIR N/A 07/26/2020   Procedure: LAPAROSCOPIC VENTRAL HERNIA REPAIR WITH MESH;  Surgeon: Axel Filler, MD;  Location: [facility] OR;  Service: General;  Laterality: N/A;    Social History   Socioeconomic History   Marital status: Married    Spouse name: Not on file   Number of children: 2    Years of education: Not on file   Highest education level: Not on file  Occupational History   Occupation: Self employed  Tobacco Use   Smoking status: Never    Passive exposure: Never   Smokeless tobacco: Never  Vaping Use   Vaping status: Never Used  Substance and Sexual Activity   Alcohol use: Yes    Alcohol/week: 10.0 standard drinks of alcohol    Types: 10 Glasses of wine per week    Comment: 10 glasses/week   Drug use: No   Sexual activity: Yes    Birth control/protection: Post-menopausal  Other Topics Concern   Not on file  Social History Narrative   Not on file   Social Determinants of Health   Financial Resource Strain: Low Risk  (09/17/2022)   Overall Financial Resource Strain (CARDIA)    Difficulty of Paying Living Expenses: Not hard at all  Food Insecurity: No Food Insecurity (09/17/2022)   Hunger Vital Sign    Worried About Running Out of Food in the Last Year: Never true    Ran Out of Food in the Last Year: Never true  Transportation Needs: No Transportation Needs (09/17/2022)   PRAPARE - Administrator, Civil Service (Medical): No    Lack of Transportation (Non-Medical): No  Physical Activity: Sufficiently Active (09/17/2022)   Exercise Vital Sign    Days of Exercise per Week: 5 days    Minutes of Exercise per Session: 60 min  Stress: Stress Concern Present (09/17/2022)   [employer] of Occupational Health - Occupational Stress Questionnaire    Feeling of Stress : To some extent  Social Connections: Socially Integrated (09/17/2022)   Social Connection and Isolation Panel [NHANES]    Frequency of Communication with Friends and Family: Three times a week    Frequency of Social Gatherings with Friends and Family: Once a week    Attends Religious Services: More than 4 times per year    Active Member of [employer] or Organizations: Yes    Attends Engineer, structural: More than 4 times per year    Marital Status: Married  Catering manager  Violence: Not on file     Allergies  Allergen Reactions   Chlorhexidine Gluconate Itching   Gabapentin Itching   Hydrocodone Itching   Oxycodone Hcl Itching    NDC FAOZ:[id number]  NDC EXBM:[id number]   Betadine [Povidone Iodine] Rash     Outpatient Medications Prior to Visit  Medication Sig Dispense Refill   adalimumab (HUMIRA, 2 SYRINGE,) 40 MG/0.4ML prefilled syringe Inject 0.4 mLs (40 mg total) under the skin every 14 (fourteen) days. 2 each 5   albuterol (VENTOLIN HFA) 108 (90 Base) MCG/ACT inhaler Inhale 2 puffs into the lungs every 6 (six) hours as needed for wheezing or shortness  of breath. 6.7 g 3   anastrozole (ARIMIDEX) 1 MG tablet Take 1 tablet (1 mg total) by mouth daily. 90 tablet 3   ascorbic acid (VITAMIN C) 500 MG tablet Take 500 mg by mouth 2 (two) times a week.     bimatoprost (LUMIGAN) 0.01 % SOLN Instill 1 drop into both eyes at bedtime 2.5 mL 10   busPIRone (BUSPAR) 7.5 MG tablet Take 1 tablet (7.5 mg total) by mouth 2 (two) times daily. 180 tablet 2   cetirizine (ZYRTEC) 10 MG tablet Take 10 mg by mouth in the morning.     DULoxetine (CYMBALTA) 60 MG capsule Take 1 capsule (60 mg total) by mouth daily. 90 capsule 1   fluocinonide ointment (LIDEX) 0.05 % Apply 1 application  topically daily as needed (irritation).     levothyroxine (SYNTHROID) 50 MCG tablet Take 2 tablets (100 mcg total) by mouth daily before breakfast. 180 tablet 3   omeprazole (PRILOSEC) 20 MG capsule Take 1 capsule (20 mg total) by mouth daily. 90 capsule 3   celecoxib (CELEBREX) 100 MG capsule Take 1 capsule (100 mg total) by mouth in the morning and at bedtime. (Patient not taking: Reported on 09/05/2022) 60 capsule 11   HYDROcodone bit-homatropine (HYCODAN) 5-1.5 MG/5ML syrup Take 5 mLs by mouth every 8 (eight) hours as needed for cough. (Patient not taking: Reported on 09/05/2022) 120 mL 0   Facility-Administered Medications Prior to Visit  Medication Dose Route Frequency Provider Last Rate  Last Admin   0.9 %  sodium chloride infusion  500 mL Intravenous Continuous Armbruster, Willaim Rayas, MD        Review of Systems  Constitutional:  Negative for chills, fever, malaise/fatigue and weight loss.  HENT:  Negative for hearing loss, sore throat and tinnitus.   Eyes:  Negative for blurred vision and double vision.  Respiratory:  Positive for cough and shortness of breath. Negative for hemoptysis, sputum production, wheezing and stridor.   Cardiovascular:  Negative for chest pain, palpitations, orthopnea, leg swelling and PND.  Gastrointestinal:  Negative for abdominal pain, constipation, diarrhea, heartburn, nausea and vomiting.  Genitourinary:  Negative for dysuria, hematuria and urgency.  Musculoskeletal:  Negative for joint pain and myalgias.  Skin:  Negative for itching and rash.  Neurological:  Negative for dizziness, tingling, weakness and headaches.  Endo/Heme/Allergies:  Negative for environmental allergies. Does not bruise/bleed easily.  Psychiatric/Behavioral:  Negative for depression. The patient is not nervous/anxious and does not have insomnia.   All other systems reviewed and are negative.    Objective:  Physical Exam Vitals reviewed.  Constitutional:      General: She is not in acute distress.    Appearance: She is well-developed.  HENT:     Head: Normocephalic and atraumatic.  Eyes:     General: No scleral icterus.    Conjunctiva/sclera: Conjunctivae normal.     Pupils: Pupils are equal, round, and reactive to light.  Neck:     Vascular: No JVD.     Trachea: No tracheal deviation.  Cardiovascular:     Rate and Rhythm: Normal rate and regular rhythm.     Heart sounds: Normal heart sounds. No murmur heard. Pulmonary:     Effort: Pulmonary effort is normal. No tachypnea, accessory muscle usage or respiratory distress.     Breath sounds: No stridor. No wheezing, rhonchi or rales.  Abdominal:     General: Bowel sounds are normal. There is no distension.      Palpations: Abdomen is  soft.     Tenderness: There is no abdominal tenderness.  Musculoskeletal:        General: No tenderness.     Cervical back: Neck supple.  Lymphadenopathy:     Cervical: No cervical adenopathy.  Skin:    General: Skin is warm and dry.     Capillary Refill: Capillary refill takes less than 2 seconds.     Findings: No rash.  Neurological:     Mental Status: She is alert and oriented to person, place, and time.  Psychiatric:        Behavior: Behavior normal.      Vitals:   09/19/22 1512  BP: 110/70  Pulse: 96  SpO2: 96%  Weight: 135 lb 9.6 oz (61.5 kg)  Height: 5\' 2"  (1.575 m)   96% on RA BMI Readings from Last 3 Encounters:  09/19/22 24.80 kg/m  09/05/22 24.51 kg/m  07/31/22 25.97 kg/m   Wt Readings from Last 3 Encounters:  09/19/22 135 lb 9.6 oz (61.5 kg)  09/05/22 134 lb (60.8 kg)  07/31/22 142 lb (64.4 kg)     CBC    Component Value Date/Time   WBC 8.0 04/15/2022 0827   WBC 5.8 12/25/2021 1501   RBC 3.15 (L) 04/15/2022 0827   HGB 10.0 (L) 04/15/2022 0827   HCT 29.6 (L) 04/15/2022 0827   PLT 275 04/15/2022 0827   MCV 94.0 04/15/2022 0827   MCH 31.7 04/15/2022 0827   MCHC 33.8 04/15/2022 0827   RDW 16.1 (H) 04/15/2022 0827   LYMPHSABS 1.3 04/15/2022 0827   MONOABS 0.8 04/15/2022 0827   EOSABS 0.0 04/15/2022 0827   BASOSABS 0.0 04/15/2022 0827     Chest Imaging: October 2023 coronary calcium scoring CT: 2.7 mm groundglass subsolid lesion with a 1.2 cm solid component concerning for a adenocarcinoma of the lung. The patient's images have been independently reviewed by me.    Pulmonary Functions Testing Results:    Latest Ref Rng & Units 10/22/2021    8:52 AM  PFT Results  FVC-Pre L 3.49   FVC-Predicted Pre % 114   FVC-Post L 3.50   FVC-Predicted Post % 115   Pre FEV1/FVC % % 74   Post FEV1/FCV % % 76   FEV1-Pre L 2.58   FEV1-Predicted Pre % 110   FEV1-Post L 2.67   DLCO uncorrected ml/min/mmHg 19.15   DLCO UNC% %  101   DLVA Predicted % 89   TLC L 5.42   TLC % Predicted % 114   RV % Predicted % 92     FeNO:   Pathology:   Echocardiogram:   Heart Catheterization:     Assessment & Plan:     ICD-10-CM   1. 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 History of COVID-19  Z86.16       Discussion:  This is a 62 year old female, non-smoker, found to incidentally have a pulmonary nodule that improved after CT imaging.  At that time we also found a breast lesion.  She was diagnosed with breast cancer and has been undergoing treatment for this.  Overall doing well.  Recent diagnosis of COVID-19 with persistent bronchitis symptoms.  Appears to have reactive airway disease.  Plan: Working to start her on a ICS inhaler to see if this improves her bronchitis symptoms as she recovers. It does appear that she is getting better but she is only a couple weeks out from her diagnosis and I suspect she will continue to  improve over the next 2 to 3 weeks. I have samples of Breztri which we will give her today in the office to see if this improves along with some bronchodilatation. She does have an albuterol inhaler. If it continues beyond that we can consider other things like pulmonary function test and consideration for HRCT imaging.  But I do not think that she needs this at this time. Only if symptoms persist but she does appear to be slowly improving.    Current Outpatient Medications:    adalimumab (HUMIRA, 2 SYRINGE,) 40 MG/0.4ML prefilled syringe, Inject 0.4 mLs (40 mg total) under the skin every 14 (fourteen) days., Disp: 2 each, Rfl: 5   albuterol (VENTOLIN HFA) 108 (90 Base) MCG/ACT inhaler, Inhale 2 puffs into the lungs every 6 (six) hours as needed for wheezing or shortness of breath., Disp: 6.7 g, Rfl: 3   anastrozole (ARIMIDEX) 1 MG tablet, Take 1 tablet (1 mg total) by mouth daily., Disp: 90 tablet, Rfl: 3   ascorbic acid (VITAMIN C) 500 MG tablet, Take 500 mg by mouth 2 (two)  times a week., Disp: , Rfl:    bimatoprost (LUMIGAN) 0.01 % SOLN, Instill 1 drop into both eyes at bedtime, Disp: 2.5 mL, Rfl: 10   busPIRone (BUSPAR) 7.5 MG tablet, Take 1 tablet (7.5 mg total) by mouth 2 (two) times daily., Disp: 180 tablet, Rfl: 2   cetirizine (ZYRTEC) 10 MG tablet, Take 10 mg by mouth in the morning., Disp: , Rfl:    DULoxetine (CYMBALTA) 60 MG capsule, Take 1 capsule (60 mg total) by mouth daily., Disp: 90 capsule, Rfl: 1   fluocinonide ointment (LIDEX) 0.05 %, Apply 1 application  topically daily as needed (irritation)., Disp: , Rfl:    levothyroxine (SYNTHROID) 50 MCG tablet, Take 2 tablets (100 mcg total) by mouth daily before breakfast., Disp: 180 tablet, Rfl: 3   omeprazole (PRILOSEC) 20 MG capsule, Take 1 capsule (20 mg total) by mouth daily., Disp: 90 capsule, Rfl: 3   celecoxib (CELEBREX) 100 MG capsule, Take 1 capsule (100 mg total) by mouth in the morning and at bedtime. (Patient not taking: Reported on 09/05/2022), Disp: 60 capsule, Rfl: 11   HYDROcodone bit-homatropine (HYCODAN) 5-1.5 MG/5ML syrup, Take 5 mLs by mouth every 8 (eight) hours as needed for cough. (Patient not taking: Reported on 09/05/2022), Disp: 120 mL, Rfl: 0  Current Facility-Administered Medications:    0.9 %  sodium chloride infusion, 500 mL, Intravenous, Continuous, Armbruster, Willaim Rayas, MD   Josephine Igo, DO [facility] 09/19/2022 3:38 PM

## 2022-09-19 NOTE — Patient Instructions (Signed)
Thank you for visiting Dr. Tonia Brooms at [facility]. Today we recommend the following:  Breztri inhaler samples  Return if symptoms worsen or fail to improve.    Please do your part to reduce the spread of COVID-19.

## 2022-09-23 ENCOUNTER — Other Ambulatory Visit: Payer: 59

## 2022-09-23 ENCOUNTER — Other Ambulatory Visit (HOSPITAL_COMMUNITY): Payer: Self-pay

## 2022-09-23 DIAGNOSIS — E7800 Pure hypercholesterolemia, unspecified: Secondary | ICD-10-CM | POA: Diagnosis not present

## 2022-09-23 DIAGNOSIS — Z1329 Encounter for screening for other suspected endocrine disorder: Secondary | ICD-10-CM | POA: Diagnosis not present

## 2022-09-23 DIAGNOSIS — Z0000 Encounter for general adult medical examination without abnormal findings: Secondary | ICD-10-CM | POA: Diagnosis not present

## 2022-09-23 DIAGNOSIS — G8929 Other chronic pain: Secondary | ICD-10-CM | POA: Diagnosis not present

## 2022-09-23 DIAGNOSIS — M7918 Myalgia, other site: Secondary | ICD-10-CM | POA: Diagnosis not present

## 2022-09-24 ENCOUNTER — Encounter: Payer: Self-pay | Admitting: Internal Medicine

## 2022-09-24 ENCOUNTER — Ambulatory Visit (INDEPENDENT_AMBULATORY_CARE_PROVIDER_SITE_OTHER): Payer: 59 | Admitting: Internal Medicine

## 2022-09-24 VITALS — BP 110/80 | HR 85 | Ht 62.0 in | Wt 136.0 lb

## 2022-09-24 DIAGNOSIS — J309 Allergic rhinitis, unspecified: Secondary | ICD-10-CM

## 2022-09-24 DIAGNOSIS — R051 Acute cough: Secondary | ICD-10-CM | POA: Diagnosis not present

## 2022-09-24 DIAGNOSIS — Z0000 Encounter for general adult medical examination without abnormal findings: Secondary | ICD-10-CM | POA: Diagnosis not present

## 2022-09-24 DIAGNOSIS — K219 Gastro-esophageal reflux disease without esophagitis: Secondary | ICD-10-CM

## 2022-09-24 DIAGNOSIS — Z8616 Personal history of COVID-19: Secondary | ICD-10-CM

## 2022-09-24 DIAGNOSIS — G8929 Other chronic pain: Secondary | ICD-10-CM

## 2022-09-24 DIAGNOSIS — R918 Other nonspecific abnormal finding of lung field: Secondary | ICD-10-CM | POA: Diagnosis not present

## 2022-09-24 DIAGNOSIS — Z85850 Personal history of malignant neoplasm of thyroid: Secondary | ICD-10-CM

## 2022-09-24 DIAGNOSIS — F419 Anxiety disorder, unspecified: Secondary | ICD-10-CM

## 2022-09-24 DIAGNOSIS — R053 Chronic cough: Secondary | ICD-10-CM

## 2022-09-24 DIAGNOSIS — J4520 Mild intermittent asthma, uncomplicated: Secondary | ICD-10-CM

## 2022-09-24 DIAGNOSIS — M7918 Myalgia, other site: Secondary | ICD-10-CM

## 2022-09-24 DIAGNOSIS — Z8719 Personal history of other diseases of the digestive system: Secondary | ICD-10-CM

## 2022-09-24 DIAGNOSIS — E7800 Pure hypercholesterolemia, unspecified: Secondary | ICD-10-CM

## 2022-09-24 DIAGNOSIS — Z8669 Personal history of other diseases of the nervous system and sense organs: Secondary | ICD-10-CM

## 2022-09-24 DIAGNOSIS — K200 Eosinophilic esophagitis: Secondary | ICD-10-CM | POA: Diagnosis not present

## 2022-09-24 DIAGNOSIS — Z79620 Long term (current) use of immunosuppressive biologic: Secondary | ICD-10-CM

## 2022-09-24 DIAGNOSIS — C50912 Malignant neoplasm of unspecified site of left female breast: Secondary | ICD-10-CM

## 2022-09-24 DIAGNOSIS — U099 Post covid-19 condition, unspecified: Secondary | ICD-10-CM

## 2022-09-24 LAB — CBC WITH DIFFERENTIAL/PLATELET
Absolute Monocytes: 513 {cells}/uL (ref 200–950)
Basophils Absolute: 81 {cells}/uL (ref 0–200)
Basophils Relative: 1.8 %
Eosinophils Absolute: 302 {cells}/uL (ref 15–500)
Eosinophils Relative: 6.7 %
HCT: 36.5 % (ref 35.0–45.0)
Hemoglobin: 12 g/dL (ref 11.7–15.5)
Lymphs Abs: 1589 {cells}/uL (ref 850–3900)
MCH: 29.9 pg (ref 27.0–33.0)
MCHC: 32.9 g/dL (ref 32.0–36.0)
MCV: 91 fL (ref 80.0–100.0)
MPV: 9.7 fL (ref 7.5–12.5)
Monocytes Relative: 11.4 %
Neutro Abs: 2016 {cells}/uL (ref 1500–7800)
Neutrophils Relative %: 44.8 %
Platelets: 325 10*3/uL (ref 140–400)
RBC: 4.01 10*6/uL (ref 3.80–5.10)
RDW: 14.3 % (ref 11.0–15.0)
Total Lymphocyte: 35.3 %
WBC: 4.5 10*3/uL (ref 3.8–10.8)

## 2022-09-24 LAB — COMPLETE METABOLIC PANEL WITH GFR
AG Ratio: 1.5 (calc) (ref 1.0–2.5)
ALT: 13 U/L (ref 6–29)
AST: 17 U/L (ref 10–35)
Albumin: 4.2 g/dL (ref 3.6–5.1)
Alkaline phosphatase (APISO): 72 U/L (ref 37–153)
BUN: 12 mg/dL (ref 7–25)
CO2: 28 mmol/L (ref 20–32)
Calcium: 9.8 mg/dL (ref 8.6–10.4)
Chloride: 103 mmol/L (ref 98–110)
Creat: 0.62 mg/dL (ref 0.50–1.05)
Globulin: 2.8 g/dL (ref 1.9–3.7)
Glucose, Bld: 93 mg/dL (ref 65–99)
Potassium: 4.4 mmol/L (ref 3.5–5.3)
Sodium: 139 mmol/L (ref 135–146)
Total Bilirubin: 0.5 mg/dL (ref 0.2–1.2)
Total Protein: 7 g/dL (ref 6.1–8.1)
eGFR: 101 mL/min/{1.73_m2} (ref >=60)

## 2022-09-24 LAB — LIPID PANEL
Cholesterol: 250 mg/dL — ABNORMAL HIGH (ref <200)
HDL: 85 mg/dL (ref >=50)
LDL Cholesterol (Calc): 149 mg/dL — ABNORMAL HIGH
Non-HDL Cholesterol (Calc): 165 mg/dL — ABNORMAL HIGH (ref <130)
Total CHOL/HDL Ratio: 2.9 (calc) (ref <5.0)
Triglycerides: 67 mg/dL (ref <150)

## 2022-09-24 LAB — TSH: TSH: 0.66 m[IU]/L (ref 0.40–4.50)

## 2022-09-24 MED ORDER — METHYLPREDNISOLONE ACETATE 80 MG/ML IJ SUSP
80.0000 mg | Freq: Once | INTRAMUSCULAR | Status: AC
Start: 2022-09-24 — End: 2022-09-24
  Administered 2022-09-24: 80 mg via INTRAMUSCULAR

## 2022-09-24 MED ORDER — METHYLPREDNISOLONE ACETATE 40 MG/ML IJ SUSP
40.0000 mg | Freq: Once | INTRAMUSCULAR | Status: DC
Start: 2022-09-24 — End: 2022-09-24

## 2022-09-25 ENCOUNTER — Other Ambulatory Visit (HOSPITAL_COMMUNITY): Payer: Self-pay

## 2022-09-25 DIAGNOSIS — L738 Other specified follicular disorders: Secondary | ICD-10-CM | POA: Diagnosis not present

## 2022-09-25 DIAGNOSIS — D225 Melanocytic nevi of trunk: Secondary | ICD-10-CM | POA: Diagnosis not present

## 2022-09-25 DIAGNOSIS — D2271 Melanocytic nevi of right lower limb, including hip: Secondary | ICD-10-CM | POA: Diagnosis not present

## 2022-09-25 DIAGNOSIS — D485 Neoplasm of uncertain behavior of skin: Secondary | ICD-10-CM | POA: Diagnosis not present

## 2022-09-25 DIAGNOSIS — L821 Other seborrheic keratosis: Secondary | ICD-10-CM | POA: Diagnosis not present

## 2022-09-25 DIAGNOSIS — L814 Other melanin hyperpigmentation: Secondary | ICD-10-CM | POA: Diagnosis not present

## 2022-09-25 DIAGNOSIS — D224 Melanocytic nevi of scalp and neck: Secondary | ICD-10-CM | POA: Diagnosis not present

## 2022-09-25 DIAGNOSIS — D2272 Melanocytic nevi of left lower limb, including hip: Secondary | ICD-10-CM | POA: Diagnosis not present

## 2022-09-25 DIAGNOSIS — L570 Actinic keratosis: Secondary | ICD-10-CM | POA: Diagnosis not present

## 2022-09-25 LAB — POCT URINALYSIS DIP (CLINITEK)
Bilirubin, UA: NEGATIVE
Blood, UA: NEGATIVE
Glucose, UA: NEGATIVE mg/dL
Ketones, POC UA: NEGATIVE mg/dL
Leukocytes, UA: NEGATIVE
Nitrite, UA: NEGATIVE
POC PROTEIN,UA: NEGATIVE
Spec Grav, UA: 1.01 (ref 1.010–1.025)
Urobilinogen, UA: NEGATIVE U/dL — AB
pH, UA: 6.5 (ref 5.0–8.0)

## 2022-09-26 ENCOUNTER — Other Ambulatory Visit (HOSPITAL_COMMUNITY): Payer: Self-pay

## 2022-09-26 ENCOUNTER — Telehealth: Payer: Self-pay

## 2022-09-26 MED ORDER — ROSUVASTATIN CALCIUM 5 MG PO TABS
5.0000 mg | ORAL_TABLET | ORAL | 3 refills | Status: DC
  Filled 2022-09-26: qty 12, 28d supply, fill #0
  Filled 2022-10-28: qty 12, 28d supply, fill #1
  Filled 2022-11-21: qty 12, 28d supply, fill #2

## 2022-09-26 NOTE — Telephone Encounter (Signed)
Patient called would like to restart rosuvastatin, she was on rosuvastatin 5 mg three times weekly.

## 2022-10-02 NOTE — Patient Instructions (Addendum)
Patient has had persistent cough since having COVID-19.  Given 80 mg IM Depo-Medrol today in the office.  She will contact us if symptoms worsen or not improving.  Continue to work with diet and exercise for hyperlipidemia.  Does not want to be on statin medication.  Continue follow-up with Oncology.  Return in 1 year or as needed.

## 2022-10-03 ENCOUNTER — Other Ambulatory Visit: Payer: Self-pay

## 2022-10-05 ENCOUNTER — Encounter (HOSPITAL_COMMUNITY): Payer: Self-pay

## 2022-10-15 ENCOUNTER — Ambulatory Visit (INDEPENDENT_AMBULATORY_CARE_PROVIDER_SITE_OTHER): Payer: 59 | Admitting: Pulmonary Disease

## 2022-10-15 DIAGNOSIS — R911 Solitary pulmonary nodule: Secondary | ICD-10-CM

## 2022-10-15 LAB — PULMONARY FUNCTION TEST
DL/VA % pred: 91 %
DL/VA: 3.9 ml/min/mmHg/L
DLCO cor % pred: 107 %
DLCO cor: 20.05 ml/min/mmHg
DLCO unc % pred: 102 %
DLCO unc: 19.13 ml/min/mmHg
FEF 25-75 Post: 3 L/s
FEF 25-75 Pre: 2.53 L/s
FEF2575-%Change-Post: 18 %
FEF2575-%Pred-Post: 139 %
FEF2575-%Pred-Pre: 117 %
FEV1-%Change-Post: 3 %
FEV1-%Pred-Post: 120 %
FEV1-%Pred-Pre: 116 %
FEV1-Post: 2.79 L
FEV1-Pre: 2.7 L
FEV1FVC-%Change-Post: 1 %
FEV1FVC-%Pred-Pre: 102 %
FEV6-%Change-Post: 1 %
FEV6-%Pred-Post: 118 %
FEV6-%Pred-Pre: 117 %
FEV6-Post: 3.44 L
FEV6-Pre: 3.4 L
FEV6FVC-%Change-Post: 0 %
FEV6FVC-%Pred-Post: 103 %
FEV6FVC-%Pred-Pre: 103 %
FVC-%Change-Post: 1 %
FVC-%Pred-Post: 115 %
FVC-%Pred-Pre: 113 %
FVC-Post: 3.47 L
FVC-Pre: 3.4 L
Post FEV1/FVC ratio: 81 %
Post FEV6/FVC ratio: 100 %
Pre FEV1/FVC ratio: 79 %
Pre FEV6/FVC Ratio: 100 %
RV % pred: 125 %
RV: 2.42 L
TLC % pred: 120 %
TLC: 5.74 L

## 2022-10-15 NOTE — Progress Notes (Signed)
Full PFT performed today. °

## 2022-10-15 NOTE — Patient Instructions (Signed)
Full PFT performed today. °

## 2022-10-18 ENCOUNTER — Encounter: Payer: Self-pay | Admitting: Gastroenterology

## 2022-10-18 ENCOUNTER — Ambulatory Visit: Payer: 59 | Attending: Internal Medicine | Admitting: Pharmacist

## 2022-10-18 ENCOUNTER — Telehealth: Payer: Self-pay | Admitting: Gastroenterology

## 2022-10-18 DIAGNOSIS — Z79899 Other long term (current) drug therapy: Secondary | ICD-10-CM

## 2022-10-18 NOTE — Progress Notes (Signed)
S: Patient presents today for review of her Humira.  Patient is currently taking Humira for Crohn's Disease. Patient has been managed by Dr. Alben Spittle in the past but is transferring care to Dr. Adela Lank.   Adherence: denies any missed doses of Humira.    Dosing:  40 mg every 14 days.   Screening: TB test: completed prior to initiation  Hepatitis: completed prior to initiation  Efficacy: reports that it is working very well for her and that she has not really had any side effects  Monitoring: S/sx of infection: denies CBC: last one WNL (09/23/2022) S/sx of hypersensitivity: denies S/sx of malignancy: tx'd by Dr. 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 Currently on anastrazole. Completed surgery, chemotherapy, and radiation since her last visit with me. Now on anastrazole. No issue with continuing Humira at this time.  S/sx of heart failure: denies  O:  Lab Results  Component Value Date   WBC 4.5 09/23/2022   HGB 12.0 09/23/2022   HCT 36.5 09/23/2022   MCV 91.0 09/23/2022   PLT 325 09/23/2022      Chemistry      Component Value Date/Time   NA 139 09/23/2022 1207   K 4.4 09/23/2022 1207   CL 103 09/23/2022 1207   CO2 28 09/23/2022 1207   BUN 12 09/23/2022 1207   CREATININE 0.62 09/23/2022 1207      Component Value Date/Time   CALCIUM 9.8 09/23/2022 1207   ALKPHOS 53 04/15/2022 0827   AST 17 09/23/2022 1207   AST 19 04/15/2022 0827   ALT 13 09/23/2022 1207   ALT 21 04/15/2022 0827   BILITOT 0.5 09/23/2022 1207   BILITOT 0.4 04/15/2022 0827     A/P: 1. Medication review: Patient reports good efficacy. Labs have remained stable. She is regularly following with gastroenterologist. Reviewed the medication with the patient.. No recommendations for changes at this time.   Butch Penny, PharmD, P[place]y [place], CPP Clinical Pharmacist [facility] [phone]

## 2022-10-18 NOTE — Telephone Encounter (Signed)
Inbound call from patient to schedule colonoscopy. Patient has been schedule but stated previously she had to use a different form of anesthesia. Patient is unsure if she able to proceed with having colonoscopy in office. Patient requesting a call back to discuss further. Please advise, thank you.

## 2022-10-18 NOTE — Telephone Encounter (Signed)
Patient called back. She indicates that she previously had to have procedures completed with [facility] hospital specialty clinic because she required propofol anesthesia. I advised that we, too, use propofol anesthesia for our procedures. I also confirmed with patient that she has no cardiac problems, no oxygen dependency etc. Advised we should be able to safely move forward with her procedure in [facility] using propofol.

## 2022-10-18 NOTE — Telephone Encounter (Signed)
Left message for patient to call back  

## 2022-10-25 ENCOUNTER — Other Ambulatory Visit (HOSPITAL_COMMUNITY): Payer: Self-pay

## 2022-10-28 ENCOUNTER — Other Ambulatory Visit (HOSPITAL_COMMUNITY): Payer: Self-pay

## 2022-10-28 ENCOUNTER — Other Ambulatory Visit: Payer: Self-pay

## 2022-10-30 ENCOUNTER — Other Ambulatory Visit: Payer: Self-pay

## 2022-10-30 NOTE — Progress Notes (Signed)
Specialty Pharmacy Refill Coordination Note  [name] is a 62 y.o. female contacted today regarding refills of specialty medication(s) Adalimumab   Patient requested Delivery   Delivery date: 11/06/22   Verified address: [address] [place] [address]   Medication will be filled on 11/05/22.

## 2022-11-04 ENCOUNTER — Encounter: Payer: Self-pay | Admitting: Gastroenterology

## 2022-11-05 ENCOUNTER — Other Ambulatory Visit: Payer: Self-pay | Admitting: Internal Medicine

## 2022-11-05 ENCOUNTER — Other Ambulatory Visit (HOSPITAL_COMMUNITY): Payer: Self-pay

## 2022-11-05 ENCOUNTER — Other Ambulatory Visit: Payer: Self-pay

## 2022-11-05 MED ORDER — DULOXETINE HCL 60 MG PO CPEP
60.0000 mg | ORAL_CAPSULE | Freq: Every day | ORAL | 1 refills | Status: DC
  Filled 2022-11-05 – 2022-11-28 (×2): qty 90, 90d supply, fill #0

## 2022-11-05 MED ORDER — CELECOXIB 100 MG PO CAPS
100.0000 mg | ORAL_CAPSULE | Freq: Two times a day (BID) | ORAL | 11 refills | Status: AC
  Filled 2022-11-05: qty 60, 30d supply, fill #0
  Filled 2022-12-25: qty 60, 30d supply, fill #1
  Filled 2023-04-14: qty 60, 30d supply, fill #2
  Filled 2023-05-09: qty 60, 30d supply, fill #3
  Filled 2023-05-29 – 2023-06-16 (×2): qty 60, 30d supply, fill #4
  Filled 2023-07-11: qty 60, 30d supply, fill #5
  Filled 2023-08-10: qty 60, 30d supply, fill #6
  Filled 2023-09-23: qty 60, 30d supply, fill #7
  Filled 2023-10-21: qty 60, 30d supply, fill #8

## 2022-11-05 NOTE — Progress Notes (Signed)
Pharmacy Patient Advocate Encounter   Received notification from Patient Pharmacy that prior authorization for Humira is required/requested.   Insurance verification completed.   The patient is insured through [facility] .   Per test claim: PA required; PA submitted to [facility] via CoverMyMeds Key/confirmation #/EOC [id number] Status is pending

## 2022-11-06 ENCOUNTER — Other Ambulatory Visit (HOSPITAL_COMMUNITY): Payer: Self-pay

## 2022-11-06 ENCOUNTER — Other Ambulatory Visit: Payer: Self-pay

## 2022-11-06 NOTE — Progress Notes (Signed)
Pharmacy Patient Advocate Encounter  Received notification from [facility] that Prior Authorization for Humira has been APPROVED from 11/05/22 to 11/04/23   PA #/Case ID/Reference #: [id number]

## 2022-11-07 ENCOUNTER — Other Ambulatory Visit: Payer: Self-pay

## 2022-11-07 ENCOUNTER — Other Ambulatory Visit (HOSPITAL_COMMUNITY): Payer: Self-pay

## 2022-11-11 ENCOUNTER — Ambulatory Visit: Payer: 59 | Attending: Hematology and Oncology

## 2022-11-11 VITALS — Wt 139.4 lb

## 2022-11-11 DIAGNOSIS — Z483 Aftercare following surgery for neoplasm: Secondary | ICD-10-CM | POA: Insufficient documentation

## 2022-11-11 NOTE — Therapy (Signed)
OUTPATIENT PHYSICAL THERAPY SOZO SCREENING NOTE   Patient Name: [name] MRN: [id number] DOB:April 25, 1960, 62 y.o., female Today's Date: 11/11/2022  PCP: Margaree Mackintosh, MD REFERRING PROVIDER: Serena Croissant, MD   PT End of Session - 11/11/22 1511     Visit Number 11   # unchanged due to screen only   PT Start Time 1509    PT Stop Time 1513    PT Time Calculation (min) 4 min    Activity Tolerance Patient tolerated treatment well    Behavior During Therapy WFL for tasks assessed/performed             Past Medical History:  Diagnosis Date   Anxiety    Allergy induced asthma   Arthritis    Asthma    Breast cancer (HCC)    Crohn disease (HCC)    Depression    GERD (gastroesophageal reflux disease)    History of methicillin resistant staphylococcus aureus (MRSA)    pt denies this.   History of radiation therapy    Left Breast- 05/09/22-06/11/22-Dr. Antony Blackbird   Multiple thyroid nodules    Pneumonia    Port-A-Cath in place 03/25/2022   Sleep apnea    CPAP   Thyroid cancer [facility])    Past Surgical History:  Procedure Laterality Date   ANAL FISSURE REPAIR  2004   BREAST LUMPECTOMY WITH RADIOACTIVE SEED LOCALIZATION Left 01/01/2022   Procedure: LEFT BREAST SEED LUMPECTOMY;  Surgeon: Harriette Bouillon, MD;  Location: [facility] OR;  Service: General;  Laterality: Left;   CESAREAN SECTION     COLONOSCOPY     INSERTION OF MESH N/A 07/26/2020   Procedure: INSERTION OF MESH;  Surgeon: Axel Filler, MD;  Location: [facility] OR;  Service: General;  Laterality: N/A;   LIGAMENT REPAIR Right 11/23/2015   Procedure: right index radial collateral LIGAMENT REPAIR;  Surgeon: Betha Loa, MD;  Location: [facility];  Service: Orthopedics;  Laterality: Right;   PORTACATH PLACEMENT Right 02/07/2022   Procedure: INSERTION PORT-A-CATH;  Surgeon: Harriette Bouillon, MD;  Location: [facility] OR;  Service: General;  Laterality: Right;   RE-EXCISION OF BREAST LUMPECTOMY Left 01/22/2022    Procedure: RE-EXCISION OF LEFT BREAST LUMPECTOMY;  Surgeon: Harriette Bouillon, MD;  Location: [facility];  Service: General;  Laterality: Left;   SENTINEL NODE BIOPSY Left 01/22/2022   Procedure: LEFT SENTINEL NODE BIOPSY;  Surgeon: Harriette Bouillon, MD;  Location: [facility];  Service: General;  Laterality: Left;   THYROIDECTOMY N/A 01/01/2022   Procedure: TOTAL THYROIDECTOMY WITH LIMITED LYMPH NODE DISSECTION;  Surgeon: Darnell Level, MD;  Location: [facility] OR;  Service: General;  Laterality: N/A;   UPPER ENDOSCOPY W/ ESOPHAGEAL MANOMETRY  10/2021   VENTRAL HERNIA REPAIR N/A 07/26/2020   Procedure: LAPAROSCOPIC VENTRAL HERNIA REPAIR WITH MESH;  Surgeon: Axel Filler, MD;  Location: [facility] OR;  Service: General;  Laterality: N/A;   Patient Active Problem List   Diagnosis Date Noted   Malignant neoplasm of upper-inner quadrant of left breast in female, estrogen receptor positive (HCC) 01/09/2022   Postsurgical hypothyroidism 01/04/2022   Multiple thyroid nodules 12/20/2021   Papillary thyroid carcinoma (HCC) 12/20/2021   Nodule of lower lobe of right lung 10/17/2021   Left upper lobe pulmonary nodule 10/17/2021   Acromioclavicular sprain, left, initial encounter 07/27/2021   Tibialis posterior tendinitis, right 06/15/2020   Piriformis syndrome of left side 06/15/2020   Umbilical hernia 03/21/2020   Lumbar radiculopathy 07/09/2019   Contusion of right hip and thigh  04/20/2019   Loss of transverse plantar arch 02/11/2019   Sprain of iliolumbar ligament 10/29/2018   Piriformis syndrome of right side 09/30/2018   Nonallopathic lesion of sacral region 09/30/2018   Nonallopathic lesion of lumbosacral region 09/30/2018   Nonallopathic lesion of thoracic region 09/30/2018   Peroneal tendinitis of left lower extremity 02/11/2018   Polyarthralgia 02/11/2018   Patellofemoral syndrome of right knee 02/11/2018   Gastroesophageal reflux disease 01/16/2017   Cough variant asthma  01/16/2017   Noncompliance with medication treatment due to intermittent use of medication 01/16/2017   Allergic rhinoconjunctivitis 11/08/2015   Sinobronchitis 10/03/2015   OSA (obstructive sleep apnea) 12/14/2014   Crohn's disease of colon (HCC) 05/24/2014   Depression 02/01/2014   Disorder of intestine 12/17/2010   Anorectal polyp 12/17/2010   Glaucoma 01/14/2010   Dry skin dermatitis 01/15/2008   Crohn's disease (HCC) 01/14/2005    REFERRING DIAG: left breast cancer at risk for lymphedema  THERAPY DIAG: Aftercare following surgery for neoplasm  PERTINENT HISTORY: Patient was diagnosed on 01/01/2022 with 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 It measures 1.5 cm and is located in the upper inner quadrant with IDC only being present in lateral margin. It is ER/PR positive and HER2 negative with a Ki-67 of 5%. Thyroidectomy was also performed at the same time and 2 lymph nodes removed (nodes were negative) and all gross disease removed. Thyroid cancer will be treated with radioactive iodine   PRECAUTIONS: left UE Lymphedema risk, None  SUBJECTIVE: Pt returns for her 3 month L-Dex screen.   PAIN:  Are you having pain? No  SOZO SCREENING: Patient was assessed today using the SOZO machine to determine the lymphedema index score. This was compared to her baseline score. It was determined that she is within the recommended range when compared to her baseline and no further action is needed at this time. She will continue SOZO screenings. These are done every 3 months for 2 years post operatively followed by every 6 months for 2 years, and then annually.   L-DEX FLOWSHEETS - 11/11/22 1500       L-DEX LYMPHEDEMA SCREENING   Measurement Type Unilateral    L-DEX MEASUREMENT EXTREMITY Upper Extremity    POSITION  Standing    DOMINANT SIDE Right    At Risk Side Left    BASELINE SCORE (UNILATERAL) 2.8    L-DEX SCORE (UNILATERAL) 2.6    VALUE CHANGE (UNILAT)  -0.2               Hermenia Bers, PTA 11/11/2022, 3:12 PM

## 2022-11-21 ENCOUNTER — Other Ambulatory Visit (HOSPITAL_COMMUNITY): Payer: Self-pay

## 2022-11-21 DIAGNOSIS — L988 Other specified disorders of the skin and subcutaneous tissue: Secondary | ICD-10-CM | POA: Diagnosis not present

## 2022-11-21 MED ORDER — MUPIROCIN 2 % EX OINT
1.0000 | TOPICAL_OINTMENT | Freq: Every day | CUTANEOUS | 0 refills | Status: DC
  Filled 2022-11-21: qty 22, 7d supply, fill #0

## 2022-11-27 ENCOUNTER — Ambulatory Visit (HOSPITAL_BASED_OUTPATIENT_CLINIC_OR_DEPARTMENT_OTHER)
Admission: RE | Admit: 2022-11-27 | Discharge: 2022-11-27 | Disposition: A | Discharge status: Home / Self Care | Payer: 59 | Source: Ambulatory Visit | Attending: Acute Care | Admitting: Acute Care

## 2022-11-27 DIAGNOSIS — R911 Solitary pulmonary nodule: Secondary | ICD-10-CM

## 2022-11-28 ENCOUNTER — Other Ambulatory Visit (HOSPITAL_COMMUNITY): Payer: Self-pay

## 2022-11-29 ENCOUNTER — Other Ambulatory Visit: Payer: Self-pay

## 2022-11-29 NOTE — Progress Notes (Signed)
Specialty Pharmacy Refill Coordination Note  [name] is a 62 y.o. female contacted today regarding refills of specialty medication(s) Adalimumab   Patient requested Delivery   Delivery date: 12/05/22   Verified address: [address] [place] [address]   Medication will be filled on 12/04/22.

## 2022-12-04 ENCOUNTER — Ambulatory Visit (AMBULATORY_SURGERY_CENTER): Payer: 59

## 2022-12-04 ENCOUNTER — Other Ambulatory Visit (HOSPITAL_COMMUNITY): Payer: Self-pay

## 2022-12-04 VITALS — Ht 62.0 in | Wt 135.0 lb

## 2022-12-04 DIAGNOSIS — K5010 Crohn's disease of large intestine without complications: Secondary | ICD-10-CM

## 2022-12-04 DIAGNOSIS — Z860100 Personal history of colon polyps, unspecified: Secondary | ICD-10-CM

## 2022-12-04 MED ORDER — NA SULFATE-K SULFATE-MG SULF 17.5-3.13-1.6 GM/177ML PO SOLN
1.0000 | Freq: Once | ORAL | 0 refills | Status: AC
  Filled 2022-12-04: qty 354, 1d supply, fill #0

## 2022-12-04 NOTE — Progress Notes (Signed)

## 2022-12-09 ENCOUNTER — Other Ambulatory Visit (HOSPITAL_COMMUNITY): Payer: Self-pay

## 2022-12-09 ENCOUNTER — Encounter: Payer: Self-pay | Admitting: Gastroenterology

## 2022-12-09 ENCOUNTER — Telehealth: Payer: Self-pay | Admitting: Gastroenterology

## 2022-12-09 DIAGNOSIS — Z853 Personal history of malignant neoplasm of breast: Secondary | ICD-10-CM | POA: Diagnosis not present

## 2022-12-09 DIAGNOSIS — Z170 Estrogen receptor positive status [ER+]: Secondary | ICD-10-CM | POA: Diagnosis not present

## 2022-12-09 DIAGNOSIS — Z79811 Long term (current) use of aromatase inhibitors: Secondary | ICD-10-CM | POA: Diagnosis not present

## 2022-12-09 DIAGNOSIS — Z85850 Personal history of malignant neoplasm of thyroid: Secondary | ICD-10-CM | POA: Diagnosis not present

## 2022-12-09 LAB — HM DEXA SCAN: HM Dexa Scan: NORMAL

## 2022-12-09 LAB — HM MAMMOGRAPHY

## 2022-12-09 NOTE — Telephone Encounter (Signed)
Patient c/o 3 weeks daily sore throat. She states that following omeprazole 20 mg daily dosing, she was previously doing well. Patient denies any regurgitation symptoms, pain. No fever or other upper respiratory symptoms. Patient states that she does follow her antireflux measures. Denies any white patches/film in oral cavity (was taking Breztri briefly but has since d/c). No dysphagia.   Patient states that she was previously dx with EOE and wonders if the sore throat could be caused by this.  Please advise.Marland KitchenMarland Kitchen

## 2022-12-09 NOTE — Telephone Encounter (Signed)
Hard to say if reflux is causing this or not.  If her other more typical reflux symptoms are poorly controlled and possible, however if just sore throat and no other reflux or regurgitation etc., may not be related to reflux.  If she is still taking omeprazole once daily then I recommend she try it twice daily for a few weeks to see if that provides any additional benefit.  If she is not taking it at all then would recommend she take it once daily.  I am seeing her for colonoscopy in 2 weeks or so, I can discuss it further with her at that time.  In the interim she may want to see her PCP to look in her throat etc, and to evaluate for other causes.  Thanks

## 2022-12-09 NOTE — Telephone Encounter (Signed)
Patient is advised of Dr Venida Jarvis response and recommendation to increase to twice daily omeprazole dosing over the next couple of weeks. Advised that further discussion can be had at her upcoming colonoscopy if no improvement has been seen with the increased omeprazole. Also advised that she may want to see her PCP regarding sore throat so they can look at the throat and evaluate for other causes. Patient verbalizes understanding.

## 2022-12-09 NOTE — Telephone Encounter (Signed)
PT is calling to make it known she has a sore throat. She is wondering if this has something to do with Eosinophilic esophagitis. Please advise.

## 2022-12-10 ENCOUNTER — Encounter: Payer: Self-pay | Admitting: Acute Care

## 2022-12-10 ENCOUNTER — Telehealth: Payer: Self-pay

## 2022-12-10 ENCOUNTER — Ambulatory Visit: Payer: 59 | Admitting: Acute Care

## 2022-12-10 ENCOUNTER — Other Ambulatory Visit (HOSPITAL_COMMUNITY): Payer: Self-pay

## 2022-12-10 VITALS — BP 110/70 | HR 79 | Ht 62.0 in | Wt 140.2 lb

## 2022-12-10 DIAGNOSIS — R911 Solitary pulmonary nodule: Secondary | ICD-10-CM

## 2022-12-10 DIAGNOSIS — J45991 Cough variant asthma: Secondary | ICD-10-CM | POA: Diagnosis not present

## 2022-12-10 DIAGNOSIS — N951 Menopausal and female climacteric states: Secondary | ICD-10-CM | POA: Diagnosis not present

## 2022-12-10 DIAGNOSIS — N950 Postmenopausal bleeding: Secondary | ICD-10-CM | POA: Diagnosis not present

## 2022-12-10 DIAGNOSIS — Z01411 Encounter for gynecological examination (general) (routine) with abnormal findings: Secondary | ICD-10-CM | POA: Diagnosis not present

## 2022-12-10 DIAGNOSIS — Z1331 Encounter for screening for depression: Secondary | ICD-10-CM | POA: Diagnosis not present

## 2022-12-10 MED ORDER — EZETIMIBE 10 MG PO TABS
10.0000 mg | ORAL_TABLET | Freq: Every day | ORAL | 3 refills | Status: DC
  Filled 2022-12-10: qty 90, 90d supply, fill #0
  Filled 2023-03-05: qty 90, 90d supply, fill #1
  Filled 2023-05-29: qty 90, 90d supply, fill #2
  Filled 2023-08-24: qty 90, 90d supply, fill #3

## 2022-12-10 MED ORDER — VENLAFAXINE HCL ER 37.5 MG PO CP24
37.5000 mg | ORAL_CAPSULE | Freq: Every day | ORAL | 3 refills | Status: DC
  Filled 2022-12-10: qty 90, 90d supply, fill #0
  Filled 2023-03-05: qty 90, 90d supply, fill #1

## 2022-12-10 NOTE — Patient Instructions (Addendum)
It is good to see you today.  Your CT Scan shows stable lung nodules.  Plan will be for a 1 year follow up CT Chest Video visit after CT Chest 11/2023 Continue Breztri 2 puffs in the morning and 2 puffs in the evening as needed when symptomatic. Rinse mouth after use.  Call if you need [place] sooner. Please contact office for sooner follow up if symptoms do not improve or worsen or seek emergency care   Have a Happy Holiday.

## 2022-12-10 NOTE — Progress Notes (Signed)
History of Present Illness [name] is a 62 y.o. female with  past medical history of anxiety, arthritis, asthma, Crohn's disease, sleep apnea on CPAP, and breast cancer Lifelong non-smoker.Patient had a CT scan of the chest for cardiac scoring completed on 10/15/2021.  This revealed a part solid right lower lobe 2.7 cm diameter nodule with a 1.2 cm solid component suspicious morphology concerning for adenocarcinoma. Patient was referred 10/17/2021 for evaluation  of pulmonary nodule with Dr. Tonia Brooms. The nodule has been stable, she is currently under surveillance.   Breast Cancer Treatment plan: Adjuvant chemotherapy with Taxotere and Cytoxan every 3 weeks x 4 completed 04/15/2022 Adjuvant radiation therapy to be completed 06/07/2022 Adjuvant antiestrogen therapy with anastrozole 1 mg daily to start 06/15/2022   Post Chemo 4/24 Post radiation therapy 5/24   12/10/2022 Pt. Presents for follow up  for surveillance of right lower lobe lung nodule that we have been following since 10/2021. She was last seen in the office for 09/2022 post Covid. She states she is doing well. She started Breztri short term for a bout of  asthmatic bronchitis, and has had resolution of her symptoms. She just uses maintenance  when she is symptomatic.  She states she has been doing well otherwise. She does complain of a sore throat that she thinks may be from post nasal drip or GERD. She denies any fever , or upper respiratory symptoms .We have reviewed the results of her CT Chest. There are no suspicious nodules noted. There were a few mucus plugs noted. Per radiology there is no follow up required, however with her recent breast cancer, she prefers a 12 month surveillance CT chest.   Test Results: October 2023 coronary calcium scoring CT: 2.7 mm groundglass subsolid lesion with a 1.2 cm solid component concerning for a adenocarcinoma of the lung.   CT Chest 11/27/2022 Mediastinum/Nodes: Thyroidectomy clips. No  pathologically enlarged mediastinal or axillary lymph nodes. Hilar regions are difficult to definitively evaluate without IV contrast. Surgical clips in the left axilla. Esophagus is grossly unremarkable.   Lungs/Pleura: Minimal scattered mucoid impaction. Subpleural radiation scarring in the anterior left upper lobe. Tiny nodules in the left lung measure up to 4 mm along the left hemidiaphragm, unchanged from 11/30/2021 and considered benign. No specific follow-up necessary than on routine imaging. No suspicious pulmonary nodules. No pleural fluid. Airway is unremarkable.   Upper Abdomen: Visualized portions of the liver, gallbladder, adrenal glands, kidneys, spleen, pancreas, stomach and bowel are grossly unremarkable. No upper abdominal adenopathy.   Musculoskeletal: Degenerative changes in the spine.   IMPRESSION: No suspicious pulmonary nodules.    Pulmonary Functions Testing Results:     Latest Ref Rng & Units 10/22/2021    8:52 AM  PFT Results  FVC-Pre L 3.49   FVC-Predicted Pre % 114   FVC-Post L 3.50   FVC-Predicted Post % 115   Pre FEV1/FVC % % 74   Post FEV1/FCV % % 76   FEV1-Pre L 2.58   FEV1-Predicted Pre % 110   FEV1-Post L 2.67   DLCO uncorrected ml/min/mmHg 19.15   DLCO UNC% % 101   DLVA Predicted % 89   TLC L 5.42   TLC % Predicted % 114   RV % Predicted % 92           Latest Ref Rng & Units 09/23/2022   12:07 PM 04/15/2022    8:27 AM 03/25/2022    9:25 AM  CBC  WBC 3.8 - 10.8  Thousand/uL 4.5  8.0  7.2   Hemoglobin 11.7 - 15.5 g/dL 16.1  09.6  04.5   Hematocrit 35.0 - 45.0 % 36.5  29.6  30.0   Platelets 140 - 400 Thousand/uL 325  275  319        Latest Ref Rng & Units 09/23/2022   12:07 PM 04/15/2022    8:27 AM 03/25/2022    9:25 AM  BMP  Glucose 65 - 99 mg/dL 93  96  90   BUN 7 - 25 mg/dL 12  16  17    Creatinine 0.50 - 1.05 mg/dL 4.09  8.11  9.14   BUN/Creat Ratio 6 - 22 (calc) SEE NOTE:     Sodium 135 - 146 mmol/L 139  139  138   Potassium  3.5 - 5.3 mmol/L 4.4  3.8  3.7   Chloride 98 - 110 mmol/L 103  108  105   CO2 20 - 32 mmol/L 28  23  26    Calcium 8.6 - 10.4 mg/dL 9.8  9.5  9.5     BNP No results found for: "BNP"  ProBNP No results found for: "PROBNP"  PFT    Component Value Date/Time   FEV1PRE 2.70 10/15/2022 1349   FEV1POST 2.79 10/15/2022 1349   FVCPRE 3.40 10/15/2022 1349   FVCPOST 3.47 10/15/2022 1349   TLC 5.74 10/15/2022 1349   DLCOUNC 19.13 10/15/2022 1349   PREFEV1FVCRT 79 10/15/2022 1349   PSTFEV1FVCRT 81 10/15/2022 1349    CT CHEST WO CONTRAST  Result Date: 12/09/2022 CLINICAL DATA:  Lung nodule.  Breast cancer. EXAM: CT CHEST WITHOUT CONTRAST TECHNIQUE: Multidetector CT imaging of the chest was performed following the standard protocol without IV contrast. RADIATION DOSE REDUCTION: This exam was performed according to the departmental dose-optimization program which includes automated exposure control, adjustment of the mA and/or kV according to patient size and/or use of iterative reconstruction technique. COMPARISON:  06/04/2022 and 11/30/2021. FINDINGS: Cardiovascular: Heart is at the upper limits of normal in size. No pericardial effusion. Mediastinum/Nodes: Thyroidectomy clips. No pathologically enlarged mediastinal or axillary lymph nodes. Hilar regions are difficult to definitively evaluate without IV contrast. Surgical clips in the left axilla. Esophagus is grossly unremarkable. Lungs/Pleura: Minimal scattered mucoid impaction. Subpleural radiation scarring in the anterior left upper lobe. Tiny nodules in the left lung measure up to 4 mm along the left hemidiaphragm, unchanged from 11/30/2021 and considered benign. No specific follow-up necessary than on routine imaging. No suspicious pulmonary nodules. No pleural fluid. Airway is unremarkable. Upper Abdomen: Visualized portions of the liver, gallbladder, adrenal glands, kidneys, spleen, pancreas, stomach and bowel are grossly unremarkable. No upper  abdominal adenopathy. Musculoskeletal: Degenerative changes in the spine. IMPRESSION: No suspicious pulmonary nodules. Electronically Signed   By: Leanna Battles M.D.   On: 12/09/2022 13:04     Past medical hx Past Medical History:  Diagnosis Date   Anxiety    Allergy induced asthma   Arthritis    Asthma    Breast cancer (HCC)    Crohn disease (HCC)    Depression    GERD (gastroesophageal reflux disease)    History of methicillin resistant staphylococcus aureus (MRSA)    pt denies this.   History of radiation therapy    Left Breast- 05/09/22-06/11/22-Dr. Antony Blackbird   Multiple thyroid nodules    Pneumonia    Port-A-Cath in place 03/25/2022   Sleep apnea    CPAP   Thyroid cancer [facility])      Social History  Tobacco Use   Smoking status: Never    Passive exposure: Never   Smokeless tobacco: Never  Vaping Use   Vaping status: Never Used  Substance Use Topics   Alcohol use: Yes    Alcohol/week: 10.0 standard drinks of alcohol    Types: 10 Glasses of wine per week    Comment: 10 glasses/week   Drug use: No    Ms.Gearhart reports that she has never smoked. She has never been exposed to tobacco smoke. She has never used smokeless tobacco. She reports current alcohol use of about 10.0 standard drinks of alcohol per week. She reports that she does not use drugs.  Tobacco Cessation: Never smoker    Past surgical hx, Family hx, Social hx all reviewed.  Current Outpatient Medications on File Prior to Visit  Medication Sig   adalimumab (HUMIRA, 2 SYRINGE,) 40 MG/0.4ML prefilled syringe Inject 0.4 mLs (40 mg total) under the skin every 14 (fourteen) days.   albuterol (VENTOLIN HFA) 108 (90 Base) MCG/ACT inhaler Inhale 2 puffs into the lungs every 6 (six) hours as needed for wheezing or shortness of breath.   anastrozole (ARIMIDEX) 1 MG tablet Take 1 tablet (1 mg total) by mouth daily.   ascorbic acid (VITAMIN C) 500 MG tablet Take 500 mg by mouth 2 (two) times a week.    bimatoprost (LUMIGAN) 0.01 % SOLN Instill 1 drop into both eyes at bedtime   Budeson-Glycopyrrol-Formoterol (BREZTRI AEROSPHERE) 160-9-4.8 MCG/ACT AERO Inhale 2 puffs into the lungs in the morning and at bedtime.   busPIRone (BUSPAR) 7.5 MG tablet Take 1 tablet (7.5 mg total) by mouth 2 (two) times daily.   celecoxib (CELEBREX) 100 MG capsule Take 1 capsule (100 mg total) by mouth in the morning and at bedtime.   cetirizine (ZYRTEC) 10 MG tablet Take 10 mg by mouth in the morning.   Cholecalciferol (VITAMIN D) 125 MCG (5000 UT) CAPS Take by mouth.   DULoxetine (CYMBALTA) 60 MG capsule Take 1 capsule (60 mg total) by mouth daily.   fluocinonide ointment (LIDEX) 0.05 % Apply 1 application  topically daily as needed (irritation).   HYDROcodone bit-homatropine (HYCODAN) 5-1.5 MG/5ML syrup Take 5 mLs by mouth every 8 (eight) hours as needed for cough.   levothyroxine (SYNTHROID) 50 MCG tablet Take 2 tablets (100 mcg total) by mouth daily before breakfast.   Magnesium 250 MG TABS Take by mouth.   omeprazole (PRILOSEC) 20 MG capsule Take 1 capsule (20 mg total) by mouth daily.   Current Facility-Administered Medications on File Prior to Visit  Medication   0.9 %  sodium chloride infusion     Allergies  Allergen Reactions   Gabapentin Itching   Hydrocodone Itching   Oxycodone Hcl Itching    NDC WGNF:[id number]  NDC ONGE:[id number]    Review Of Systems:  Constitutional:   No  weight loss, night sweats,  Fevers, chills, fatigue, or  lassitude.  HEENT:   No headaches,  Difficulty swallowing,  Tooth/dental problems, or  +Sore throat,                No sneezing, itching, ear ache, nasal congestion, +post nasal drip,   CV:  No chest pain,  Orthopnea, PND, swelling in lower extremities, anasarca, dizziness, palpitations, syncope.   GI  No heartburn, indigestion, abdominal pain, nausea, vomiting, diarrhea, change in bowel habits, loss of appetite, bloody stools.   Resp: No shortness of breath  with exertion or at rest.  No excess mucus, no productive cough,  No non-productive cough,  No coughing up of blood.  No change in color of mucus.  No wheezing.  No chest wall deformity  Skin: no rash or lesions.  GU: no dysuria, change in color of urine, no urgency or frequency.  No flank pain, no hematuria   MS:  No joint pain or swelling.  No decreased range of motion.  No back pain.  Psych:  No change in mood or affect. No depression or anxiety.  No memory loss.   Vital Signs BP 110/70 (BP Location: Right Arm)   Pulse 79   Ht 5\' 2"  (1.575 m)   Wt 140 lb 3.2 oz (63.6 kg)   LMP 01/10/2014   SpO2 97%   BMI 25.64 kg/m    Physical Exam:  General- No distress,  A&Ox3, pleasant ENT: No sinus tenderness, TM clear, pale nasal mucosa, no oral exudate,+ post nasal drip, no LAN Cardiac: S1, S2, regular rate and rhythm, no murmur Chest: No wheeze/ rales/ dullness; no accessory muscle use, no nasal flaring, no sternal retractions Abd.: Soft Non-tender, ND, BS +, Body mass index is 25.64 kg/m.  Ext: No clubbing cyanosis, edema Neuro:  normal strength, MAE x 4, A&O x 3 Skin: No rashes, warm and dry, no lesions  Psych: normal mood and behavior   Assessment/Plan Stable Pulmonary Nodules Plan Your CT Scan shows stable lung nodules.  Plan will be for a 1 year follow up CT Chest Video visit after CT Chest 11/2023 Continue Breztri 2 puffs in the morning and 2 puffs in the evening as needed when symptomatic. Rinse mouth after use.  Call if you need [place] sooner or change your mind about a z pack.. Please contact office for sooner follow up if symptoms do not improve or worsen or seek emergency care   Have a Happy Holiday.  Stable Asthma Plan Continue Breztri 2 puffs in the morning and 2 puffs in the evening as needed when symptomatic. Rinse mouth after use.    I spent 20 minutes dedicated to the care of this patient on the date of this encounter to include pre-visit review of records,  face-to-face time with the patient discussing conditions above, post visit ordering of testing, clinical documentation with the electronic health record, making appropriate referrals as documented, and communicating necessary information to the patient's healthcare team.    Bevelyn Ngo, NP 12/10/2022  12:22 PM

## 2022-12-10 NOTE — Telephone Encounter (Signed)
Done

## 2022-12-10 NOTE — Telephone Encounter (Signed)
Copied from CRM [id number]. Topic: Clinical - Medication Question >> Dec 10, 2022 12:07 PM Mosetta Putt H wrote: Reason for EAV:[id number] wants to change rosuvastatin (CRESTOR) 5 MG tablet to zetia   Please advise.

## 2022-12-23 ENCOUNTER — Telehealth: Payer: Self-pay | Admitting: Pulmonary Disease

## 2022-12-23 NOTE — Telephone Encounter (Signed)
Pt calling for Dr. Vassie Loll stating her dentist wants her to wear a mouthguard. Please call to advise if this is your recommendation. Her insurance will only cover a mouthguard if it is Rx'd by an MD and she had in mind a place that can make her a customized one. Her # is [phone]

## 2022-12-24 ENCOUNTER — Encounter: Payer: Self-pay | Admitting: Certified Registered Nurse Anesthetist

## 2022-12-24 ENCOUNTER — Telehealth: Payer: Self-pay

## 2022-12-24 NOTE — Telephone Encounter (Signed)
 Per md orders entered for [employer] and all supported documents faxed to [phone]. Faxed confirmation was received.

## 2022-12-25 ENCOUNTER — Other Ambulatory Visit (HOSPITAL_COMMUNITY): Payer: Self-pay

## 2022-12-25 ENCOUNTER — Encounter: Payer: Self-pay | Admitting: Gastroenterology

## 2022-12-25 ENCOUNTER — Ambulatory Visit: Payer: 59 | Admitting: Gastroenterology

## 2022-12-25 VITALS — BP 171/72 | HR 71 | Temp 97.9°F | Resp 14 | Ht 62.0 in | Wt 135.0 lb

## 2022-12-25 DIAGNOSIS — G4730 Sleep apnea, unspecified: Secondary | ICD-10-CM | POA: Diagnosis not present

## 2022-12-25 DIAGNOSIS — Z1211 Encounter for screening for malignant neoplasm of colon: Secondary | ICD-10-CM | POA: Diagnosis not present

## 2022-12-25 DIAGNOSIS — K5010 Crohn's disease of large intestine without complications: Secondary | ICD-10-CM | POA: Diagnosis not present

## 2022-12-25 DIAGNOSIS — D123 Benign neoplasm of transverse colon: Secondary | ICD-10-CM

## 2022-12-25 DIAGNOSIS — K648 Other hemorrhoids: Secondary | ICD-10-CM | POA: Diagnosis not present

## 2022-12-25 DIAGNOSIS — F419 Anxiety disorder, unspecified: Secondary | ICD-10-CM | POA: Diagnosis not present

## 2022-12-25 DIAGNOSIS — D125 Benign neoplasm of sigmoid colon: Secondary | ICD-10-CM | POA: Diagnosis not present

## 2022-12-25 DIAGNOSIS — F32A Depression, unspecified: Secondary | ICD-10-CM | POA: Diagnosis not present

## 2022-12-25 DIAGNOSIS — Z860100 Personal history of colon polyps, unspecified: Secondary | ICD-10-CM

## 2022-12-25 DIAGNOSIS — K635 Polyp of colon: Secondary | ICD-10-CM | POA: Diagnosis not present

## 2022-12-25 DIAGNOSIS — K5730 Diverticulosis of large intestine without perforation or abscess without bleeding: Secondary | ICD-10-CM | POA: Diagnosis not present

## 2022-12-25 MED ORDER — SODIUM CHLORIDE 0.9 % IV SOLN: 500.0000 mL | INTRAVENOUS | Status: DC

## 2022-12-25 NOTE — Progress Notes (Signed)
Pt states no changes to health hx since previsit

## 2022-12-25 NOTE — Op Note (Signed)
[facility] Patient Name: [name] Procedure Date: 12/25/2022 2:33 PM MRN: [id number] Endoscopist: Viviann Spare P. Adela Lank , MD, [id number] Age: 62 Referring MD:  Date of Birth: [birth year] Gender: Female Account #: [id number] Procedure:                Colonoscopy Indications:              Disease activity assessment of Crohn's disease of                            the small bowel and colon - on Humira, history of                            pseudopolyps Medicines:                Monitored Anesthesia Care Procedure:                Pre-Anesthesia Assessment:                           - Prior to the procedure, a History and Physical                            was performed, and patient medications and                            allergies were reviewed. The patient's tolerance of                            previous anesthesia was also reviewed. The risks                            and benefits of the procedure and the sedation                            options and risks were discussed with the patient.                            All questions were answered, and informed consent                            was obtained. Prior Anticoagulants: The patient has                            taken no anticoagulant or antiplatelet agents. ASA                            Grade Assessment: II - A patient with mild systemic                            disease. After reviewing the risks and benefits,                            the patient was deemed in satisfactory condition to  undergo the procedure.                           After obtaining informed consent, the colonoscope                            was passed under direct vision. Throughout the                            procedure, the patient's blood pressure, pulse, and                            oxygen saturations were monitored continuously. The                            Olympus Scope SN: [id number] was  introduced through                            the anus and advanced to the the terminal ileum,                            with identification of the appendiceal orifice and                            IC valve. The colonoscopy was performed without                            difficulty. The patient tolerated the procedure                            well. The quality of the bowel preparation was                            adequate. The terminal ileum, ileocecal valve,                            appendiceal orifice, and rectum were photographed. Scope In: 2:45:18 PM Scope Out: 3:11:10 PM Scope Withdrawal Time: 0 hours 15 minutes 25 seconds  Total Procedure Duration: 0 hours 25 minutes 52 seconds  Findings:                 The perianal and digital rectal examinations were                            normal.                           The terminal ileum appeared normal.                           Multiple diverticula were found in the left colon                            and right colon.  A 10 mm polyp was found in the transverse colon.                            The polyp was sessile. The polyp was removed with a                            cold snare. Resection and retrieval were complete.                           Two sessile polyps were found in the transverse                            colon. The polyps were diminutive in size. These                            polyps were removed with a cold biopsy forceps.                            Resection and retrieval were complete.                           Three sessile polyps were found in the sigmoid                            colon. The polyps were 4 to 8 mm in size, one                            adjacent to a diverticulum. These polyps were                            removed with a cold snare. Resection and retrieval                            were complete.                           Internal hemorrhoids were found  during                            retroflexion. The hemorrhoids were small.                           The exam was otherwise without abnormality. No                            inflammation.                           Biopsies were taken with a cold forceps for                            histology for surveillance of right, transverse,  and left colon. Complications:            No immediate complications. Estimated blood loss:                            Minimal. Estimated Blood Loss:     Estimated blood loss was minimal. Impression:               - The examined portion of the ileum was normal.                           - Diverticulosis in the left colon and in the right                            colon.                           - One 10 mm polyp in the transverse colon, removed                            with a cold snare. Resected and retrieved.                           - Two diminutive polyps in the transverse colon,                            removed with a cold biopsy forceps. Resected and                            retrieved.                           - Three 4 to 8 mm polyps in the sigmoid colon,                            removed with a cold snare. Resected and retrieved.                           - Internal hemorrhoids.                           - The examination was otherwise normal. No active                            inflammation.                           - Biopsies were taken with a cold forceps for                            histology. Recommendation:           - Patient has a contact number available for                            emergencies. The signs and symptoms of potential  delayed complications were discussed with the                            patient. Return to normal activities tomorrow.                            Written discharge instructions were provided to the                            patient.                            - Resume previous diet.                           - Continue present medications.                           - Await pathology results. Viviann Spare P. Christy Ehrsam, MD 12/25/2022 3:18:24 PM This report has been signed electronically.

## 2022-12-25 NOTE — Progress Notes (Signed)
Called to room to assist during endoscopic procedure.  Patient ID and intended procedure confirmed with present staff. Received instructions for my participation in the procedure from the performing physician.  

## 2022-12-25 NOTE — Patient Instructions (Signed)
 Educational handout provided to patient related to Hemorrhoids, Polyps, and Diverticulosis  Resume previous diet  Continue present medications  Awaiting pathology results   YOU HAD AN ENDOSCOPIC PROCEDURE TODAY AT THE [facility]:   Refer to the procedure report that was given to you for any specific questions about what was found during the examination.  If the procedure report does not answer your questions, please call your gastroenterologist to clarify.  If you requested that your care partner not be given the details of your procedure findings, then the procedure report has been included in a sealed envelope for you to review at your convenience later.  YOU SHOULD EXPECT: Some feelings of bloating in the abdomen. Passage of more gas than usual.  Walking can help get rid of the air that was put into your GI tract during the procedure and reduce the bloating. If you had a lower endoscopy (such as a colonoscopy or flexible sigmoidoscopy) you may notice spotting of blood in your stool or on the toilet paper. If you underwent a bowel prep for your procedure, you may not have a normal bowel movement for a few days.  Please Note:  You might notice some irritation and congestion in your nose or some drainage.  This is from the oxygen used during your procedure.  There is no need for concern and it should clear up in a day or so.  SYMPTOMS TO REPORT IMMEDIATELY:  Following lower endoscopy (colonoscopy or flexible sigmoidoscopy):  Excessive amounts of blood in the stool  Significant tenderness or worsening of abdominal pains  Swelling of the abdomen that is new, acute  Fever of 100F or higher   For urgent or emergent issues, a gastroenterologist can be reached at any hour by calling [phone]. Do not use MyChart messaging for urgent concerns.    DIET:  We do recommend a small meal at first, but then you may proceed to your regular diet.  Drink plenty of fluids but you should  avoid alcoholic beverages for 24 hours.  ACTIVITY:  You should plan to take it easy for the rest of today and you should NOT DRIVE or use heavy machinery until tomorrow (because of the sedation medicines used during the test).    FOLLOW UP: Our staff will call the number listed on your records the next business day following your procedure.  We will call around 7:15- 8:00 am to check on you and address any questions or concerns that you may have regarding the information given to you following your procedure. If we do not reach you, we will leave a message.     If any biopsies were taken you will be contacted by phone or by letter within the next 1-3 weeks.  Please call us at [phone] if you have not heard about the biopsies in 3 weeks.    SIGNATURES/CONFIDENTIALITY: You and/or your care partner have signed paperwork which will be entered into your electronic medical record.  These signatures attest to the fact that that the information above on your After Visit Summary has been reviewed and is understood.  Full responsibility of the confidentiality of this discharge information lies with you and/or your care-partner.

## 2022-12-25 NOTE — Progress Notes (Signed)
1325 Pt coughing continuously and stating she thought she was having an "asthma attack."  Pt also c/o SOB, O2 sat on room air was 94%. Vss. Albuterol neb given.

## 2022-12-25 NOTE — Progress Notes (Signed)
1400 Spoke with patient and states she is better.  Wheezing noted in bases with albuterol neb given. Pt states she also has lots of drainage, Robinul 0.1 mg IV given due large amount of secretions upon assessment.  MD made aware, vss

## 2022-12-25 NOTE — Progress Notes (Signed)
[facility] History and Physical   Primary Care Physician:  Margaree Mackintosh, MD   Reason for Procedure:   History of Crohn's disease, history of colon polyps  Plan:    colonoscopy     HPI: [name] is a 62 y.o. female  here for colonoscopy surveillance - history of Crohn's disease, history of pseudopolyps of the colon. Last exam 2019. On Humira. This had worked to control her symptoms.  She does have asthma, given nebulizer in preop for some wheezing but feeling better post nebulizer. She denies any chest pains / shortness of breath. No wheezing at this time.   Otherwise feels well without any other symptoms at this time.   I have discussed risks / benefits of anesthesia and endoscopic procedure with [name] and they wish to proceed with the exams as outlined today.    Past Medical History:  Diagnosis Date   Anxiety    Allergy induced asthma   Arthritis    Asthma    Breast cancer (HCC)    Crohn disease (HCC)    Depression    GERD (gastroesophageal reflux disease)    History of methicillin resistant staphylococcus aureus (MRSA)    pt denies this.   History of radiation therapy    Left Breast- 05/09/22-06/11/22-Dr. Antony Blackbird   Multiple thyroid nodules    Pneumonia    Port-A-Cath in place 03/25/2022   Sleep apnea    CPAP   Thyroid cancer [facility])     Past Surgical History:  Procedure Laterality Date   ANAL FISSURE REPAIR  2004   BREAST LUMPECTOMY WITH RADIOACTIVE SEED LOCALIZATION Left 01/01/2022   Procedure: LEFT BREAST SEED LUMPECTOMY;  Surgeon: Harriette Bouillon, MD;  Location: [facility] OR;  Service: General;  Laterality: Left;   CESAREAN SECTION     COLONOSCOPY     INSERTION OF MESH N/A 07/26/2020   Procedure: INSERTION OF MESH;  Surgeon: Axel Filler, MD;  Location: [facility] OR;  Service: General;  Laterality: N/A;   LIGAMENT REPAIR Right 11/23/2015   Procedure: right index radial collateral LIGAMENT REPAIR;  Surgeon: Betha Loa, MD;   Location: [facility];  Service: Orthopedics;  Laterality: Right;   PORTACATH PLACEMENT Right 02/07/2022   Procedure: INSERTION PORT-A-CATH;  Surgeon: Harriette Bouillon, MD;  Location: [facility] OR;  Service: General;  Laterality: Right;   RE-EXCISION OF BREAST LUMPECTOMY Left 01/22/2022   Procedure: RE-EXCISION OF LEFT BREAST LUMPECTOMY;  Surgeon: Harriette Bouillon, MD;  Location: [facility];  Service: General;  Laterality: Left;   SENTINEL NODE BIOPSY Left 01/22/2022   Procedure: LEFT SENTINEL NODE BIOPSY;  Surgeon: Harriette Bouillon, MD;  Location: [facility];  Service: General;  Laterality: Left;   THYROIDECTOMY N/A 01/01/2022   Procedure: TOTAL THYROIDECTOMY WITH LIMITED LYMPH NODE DISSECTION;  Surgeon: Darnell Level, MD;  Location: [facility] OR;  Service: General;  Laterality: N/A;   UPPER ENDOSCOPY W/ ESOPHAGEAL MANOMETRY  10/2021   VENTRAL HERNIA REPAIR N/A 07/26/2020   Procedure: LAPAROSCOPIC VENTRAL HERNIA REPAIR WITH MESH;  Surgeon: Axel Filler, MD;  Location: [facility] OR;  Service: General;  Laterality: N/A;    Prior to Admission medications   Medication Sig Start Date End Date Taking? Authorizing Provider  albuterol (VENTOLIN HFA) 108 (90 Base) MCG/ACT inhaler Inhale 2 puffs into the lungs every 6 (six) hours as needed for wheezing or shortness of breath. 08/30/22  Yes [name], Luanna Cole, MD  anastrozole (ARIMIDEX) 1 MG tablet Take 1 tablet (1 mg  total) by mouth daily. 06/04/22  Yes Serena Croissant, MD  ascorbic acid (VITAMIN C) 500 MG tablet Take 500 mg by mouth 2 (two) times a week.   Yes [provider]  bimatoprost (LUMIGAN) 0.01 % SOLN Instill 1 drop into both eyes at bedtime 10/31/21  Yes   Budeson-Glycopyrrol-Formoterol (BREZTRI AEROSPHERE) 160-9-4.8 MCG/ACT AERO Inhale 2 puffs into the lungs in the morning and at bedtime. 09/19/22  Yes Icard, Bradley L, DO  busPIRone (BUSPAR) 7.5 MG tablet Take 1 tablet (7.5 mg total) by mouth 2 (two) times daily. 06/25/22   Yes [name], Luanna Cole, MD  cetirizine (ZYRTEC) 10 MG tablet Take 10 mg by mouth in the morning.   Yes [provider]  Cholecalciferol (VITAMIN D) 125 MCG (5000 UT) CAPS Take by mouth.   Yes [provider]  DULoxetine (CYMBALTA) 60 MG capsule Take 1 capsule (60 mg total) by mouth daily. 11/05/22  Yes [name], Luanna Cole, MD  ezetimibe (ZETIA) 10 MG tablet Take 1 tablet (10 mg total) by mouth daily. 12/10/22  Yes [name], Luanna Cole, MD  levothyroxine (SYNTHROID) 50 MCG tablet Take 2 tablets (100 mcg total) by mouth daily before breakfast. 04/09/22  Yes Carlus Pavlov, MD  Magnesium 250 MG TABS Take by mouth.   Yes [provider]  omeprazole (PRILOSEC) 20 MG capsule Take 1 capsule (20 mg total) by mouth daily. 02/25/22  Yes Mieshia Pepitone, Willaim Rayas, MD  venlafaxine XR (EFFEXOR-XR) 37.5 MG 24 hr capsule Take 1 capsule (37.5 mg total) by mouth daily. 12/10/22  Yes   adalimumab (HUMIRA, 2 SYRINGE,) 40 MG/0.4ML prefilled syringe Inject 0.4 mLs (40 mg total) under the skin every 14 (fourteen) days. 05/22/22   Quentin Angst, MD  celecoxib (CELEBREX) 100 MG capsule Take 1 capsule (100 mg total) by mouth in the morning and at bedtime. 11/05/22   Margaree Mackintosh, MD  fluocinonide ointment (LIDEX) 0.05 % Apply 1 application  topically daily as needed (irritation).    [provider]  HYDROcodone bit-homatropine (HYCODAN) 5-1.5 MG/5ML syrup Take 5 mLs by mouth every 8 (eight) hours as needed for cough. 08/22/22   Margaree Mackintosh, MD    Current Outpatient Medications  Medication Sig Dispense Refill   albuterol (VENTOLIN HFA) 108 (90 Base) MCG/ACT inhaler Inhale 2 puffs into the lungs every 6 (six) hours as needed for wheezing or shortness of breath. 6.7 g 3   anastrozole (ARIMIDEX) 1 MG tablet Take 1 tablet (1 mg total) by mouth daily. 90 tablet 3   ascorbic acid (VITAMIN C) 500 MG tablet Take 500 mg by mouth 2 (two) times a week.     bimatoprost (LUMIGAN) 0.01 % SOLN Instill 1 drop  into both eyes at bedtime 2.5 mL 10   Budeson-Glycopyrrol-Formoterol (BREZTRI AEROSPHERE) 160-9-4.8 MCG/ACT AERO Inhale 2 puffs into the lungs in the morning and at bedtime.     busPIRone (BUSPAR) 7.5 MG tablet Take 1 tablet (7.5 mg total) by mouth 2 (two) times daily. 180 tablet 2   cetirizine (ZYRTEC) 10 MG tablet Take 10 mg by mouth in the morning.     Cholecalciferol (VITAMIN D) 125 MCG (5000 UT) CAPS Take by mouth.     DULoxetine (CYMBALTA) 60 MG capsule Take 1 capsule (60 mg total) by mouth daily. 90 capsule 1   ezetimibe (ZETIA) 10 MG tablet Take 1 tablet (10 mg total) by mouth daily. 90 tablet 3   levothyroxine (SYNTHROID) 50 MCG tablet Take 2 tablets (100 mcg total) by  mouth daily before breakfast. 180 tablet 3   Magnesium 250 MG TABS Take by mouth.     omeprazole (PRILOSEC) 20 MG capsule Take 1 capsule (20 mg total) by mouth daily. 90 capsule 3   venlafaxine XR (EFFEXOR-XR) 37.5 MG 24 hr capsule Take 1 capsule (37.5 mg total) by mouth daily. 90 capsule 3   adalimumab (HUMIRA, 2 SYRINGE,) 40 MG/0.4ML prefilled syringe Inject 0.4 mLs (40 mg total) under the skin every 14 (fourteen) days. 2 each 5   celecoxib (CELEBREX) 100 MG capsule Take 1 capsule (100 mg total) by mouth in the morning and at bedtime. 60 capsule 11   fluocinonide ointment (LIDEX) 0.05 % Apply 1 application  topically daily as needed (irritation).     HYDROcodone bit-homatropine (HYCODAN) 5-1.5 MG/5ML syrup Take 5 mLs by mouth every 8 (eight) hours as needed for cough. 120 mL 0   Current Facility-Administered Medications  Medication Dose Route Frequency Provider Last Rate Last Admin   0.9 %  sodium chloride infusion  500 mL Intravenous Continuous Arin Vanosdol, Willaim Rayas, MD       0.9 %  sodium chloride infusion  500 mL Intravenous Continuous Oval Moralez, Willaim Rayas, MD        Allergies as of 12/25/2022 - Review Complete 12/25/2022  Allergen Reaction Noted   Gabapentin Itching 04/16/2021   Hydrocodone Itching 10/24/2015    Oxycodone hcl Itching 02/28/2017    Family History  Problem Relation Age of Onset   Alcohol abuse Mother    Alcohol abuse Sister    Angioedema Neg Hx    Allergic rhinitis Neg Hx    Asthma Neg Hx    Atopy Neg Hx    Eczema Neg Hx    Immunodeficiency Neg Hx    Urticaria Neg Hx    Colon cancer Neg Hx    Stomach cancer Neg Hx    Esophageal cancer Neg Hx    Colon polyps Neg Hx     Social History   Socioeconomic History   Marital status: Married    Spouse name: Not on file   Number of children: 2   Years of education: Not on file   Highest education level: Not on file  Occupational History   Occupation: Self employed  Tobacco Use   Smoking status: Never    Passive exposure: Never   Smokeless tobacco: Never  Vaping Use   Vaping status: Never Used  Substance and Sexual Activity   Alcohol use: Yes    Alcohol/week: 10.0 standard drinks of alcohol    Types: 10 Glasses of wine per week    Comment: 10 glasses/week   Drug use: No   Sexual activity: Yes    Birth control/protection: Post-menopausal  Other Topics Concern   Not on file  Social History Narrative   Not on file   Social Determinants of Health   Financial Resource Strain: Low Risk  (09/17/2022)   Overall Financial Resource Strain (CARDIA)    Difficulty of Paying Living Expenses: Not hard at all  Food Insecurity: No Food Insecurity (09/17/2022)   Hunger Vital Sign    Worried About Running Out of Food in the Last Year: Never true    Ran Out of Food in the Last Year: Never true  Transportation Needs: No Transportation Needs (09/17/2022)   PRAPARE - Administrator, Civil Service (Medical): No    Lack of Transportation (Non-Medical): No  Physical Activity: Sufficiently Active (09/17/2022)   Exercise Vital Sign    Days  of Exercise per Week: 5 days    Minutes of Exercise per Session: 60 min  Stress: Stress Concern Present (09/17/2022)   [employer] of Occupational Health - Occupational Stress Questionnaire     Feeling of Stress : To some extent  Social Connections: Socially Integrated (09/17/2022)   Social Connection and Isolation Panel [NHANES]    Frequency of Communication with Friends and Family: Three times a week    Frequency of Social Gatherings with Friends and Family: Once a week    Attends Religious Services: More than 4 times per year    Active Member of [employer] or Organizations: Yes    Attends Engineer, structural: More than 4 times per year    Marital Status: Married  Catering manager Violence: Not on file    Review of Systems: All other review of systems negative except as mentioned in the HPI.  Physical Exam: Vital signs BP 126/84   Pulse 88   Temp 97.9 F (36.6 C)   Ht 5\' 2"  (1.575 m)   Wt 135 lb (61.2 kg)   LMP 01/10/2014   SpO2 94%   BMI 24.69 kg/m   General:   Alert,  Well-developed, pleasant and cooperative in NAD Lungs:  Clear throughout to auscultation.   Heart:  Regular rate and rhythm Abdomen:  Soft, nontender and nondistended.   Neuro/Psych:  Alert and cooperative. Normal mood and affect. A and O x 3  Harlin Rain, MD [facility]

## 2022-12-25 NOTE — Progress Notes (Signed)
Report given to PACU, vss 

## 2022-12-25 NOTE — Progress Notes (Signed)
Pt completed albuterol treatment preop.  Pt states she is feeling much improved.

## 2022-12-26 ENCOUNTER — Telehealth: Payer: Self-pay | Admitting: *Deleted

## 2022-12-26 DIAGNOSIS — H16423 Pannus (corneal), bilateral: Secondary | ICD-10-CM | POA: Diagnosis not present

## 2022-12-26 DIAGNOSIS — H40013 Open angle with borderline findings, low risk, bilateral: Secondary | ICD-10-CM | POA: Diagnosis not present

## 2022-12-26 DIAGNOSIS — H10413 Chronic giant papillary conjunctivitis, bilateral: Secondary | ICD-10-CM | POA: Diagnosis not present

## 2022-12-26 DIAGNOSIS — H2513 Age-related nuclear cataract, bilateral: Secondary | ICD-10-CM | POA: Diagnosis not present

## 2022-12-26 NOTE — Telephone Encounter (Signed)
Post procedure follow up phone call. No answer at number given.  Left message on voicemail.  

## 2022-12-27 ENCOUNTER — Other Ambulatory Visit: Payer: Self-pay | Admitting: Gastroenterology

## 2022-12-27 ENCOUNTER — Other Ambulatory Visit: Payer: Self-pay

## 2022-12-27 DIAGNOSIS — K5010 Crohn's disease of large intestine without complications: Secondary | ICD-10-CM

## 2022-12-27 NOTE — Progress Notes (Signed)
Specialty Pharmacy Refill Coordination Note  [name] is a 62 y.o. female contacted today regarding refills of specialty medication(s) Adalimumab (Humira (2 Syringe))   Patient requested Delivery   Delivery date: 01/01/23   Verified address: [address] [place] [address]   Medication will be filled on 12.17.24.  Refill request pending. - rewrite required.

## 2022-12-30 ENCOUNTER — Other Ambulatory Visit: Payer: Self-pay | Admitting: Pharmacist

## 2022-12-30 ENCOUNTER — Other Ambulatory Visit (HOSPITAL_COMMUNITY): Payer: Self-pay

## 2022-12-30 ENCOUNTER — Encounter (HOSPITAL_COMMUNITY): Payer: Self-pay

## 2022-12-30 ENCOUNTER — Other Ambulatory Visit: Payer: Self-pay

## 2022-12-30 DIAGNOSIS — K5010 Crohn's disease of large intestine without complications: Secondary | ICD-10-CM

## 2022-12-30 LAB — SURGICAL PATHOLOGY

## 2022-12-30 MED ORDER — HUMIRA (2 SYRINGE) 40 MG/0.4ML ~~LOC~~ PSKT
40.0000 mg | PREFILLED_SYRINGE | SUBCUTANEOUS | 5 refills | Status: DC
  Filled 2022-12-30: qty 2, 28d supply, fill #0

## 2022-12-30 MED ORDER — HUMIRA (2 SYRINGE) 40 MG/0.4ML ~~LOC~~ PSKT
40.0000 mg | PREFILLED_SYRINGE | SUBCUTANEOUS | 5 refills | Status: DC
  Filled 2022-12-30: qty 2, 28d supply, fill #0
  Filled 2023-01-28: qty 2, 28d supply, fill #1
  Filled 2023-03-12: qty 2, 28d supply, fill #2
  Filled 2023-04-25: qty 2, 28d supply, fill #3
  Filled 2023-05-27: qty 2, 28d supply, fill #4
  Filled 2023-07-09: qty 2, 28d supply, fill #5

## 2022-12-30 NOTE — Progress Notes (Signed)
Please see OV from 10/2022 for full documentation.   Butch Penny, PharmD, P[place]y [place], CPP Clinical Pharmacist [facility] [phone]

## 2022-12-30 NOTE — Telephone Encounter (Signed)
Patient has not been seen for OSA since 2017. Please advise, thank you!

## 2022-12-31 ENCOUNTER — Other Ambulatory Visit: Payer: Self-pay

## 2023-01-01 ENCOUNTER — Other Ambulatory Visit (HOSPITAL_COMMUNITY): Payer: Self-pay

## 2023-01-01 ENCOUNTER — Other Ambulatory Visit: Payer: Self-pay | Admitting: Pulmonary Disease

## 2023-01-01 MED ORDER — BREZTRI AEROSPHERE 160-9-4.8 MCG/ACT IN AERO
2.0000 | INHALATION_SPRAY | Freq: Two times a day (BID) | RESPIRATORY_TRACT | 0 refills | Status: DC
  Filled 2023-01-01: qty 10.7, 30d supply, fill #0

## 2023-01-10 ENCOUNTER — Telehealth: Payer: Self-pay

## 2023-01-10 NOTE — Telephone Encounter (Signed)
Attempted to call pt regarding Guardant reveal results lvm for pt that results were negative and if any questions or concerns that patient can return call back to speak  with a nurse.

## 2023-01-13 ENCOUNTER — Encounter: Payer: Self-pay | Admitting: Hematology and Oncology

## 2023-01-13 ENCOUNTER — Telehealth: Payer: Self-pay | Admitting: Internal Medicine

## 2023-01-13 NOTE — Telephone Encounter (Signed)
Copied from CRM [id number]. Topic: Clinical - Lab/Test Results >> Jan 10, 2023 12:08 PM Fuller Mandril wrote: Reason for CRM: Pt called - had  Bone Density done 11/25 and has not yet received the results. Would like someone to call with the results if possible. Thank you.

## 2023-01-13 NOTE — Telephone Encounter (Signed)
Called patient and let her know we have not received bone density results either.

## 2023-01-22 NOTE — Telephone Encounter (Signed)
Made appt. NFN

## 2023-01-28 ENCOUNTER — Other Ambulatory Visit: Payer: Self-pay

## 2023-01-28 NOTE — Progress Notes (Signed)
 Specialty Pharmacy Refill Coordination Note  [name] is a 63 y.o. female contacted today regarding refills of specialty medication(s) Adalimumab  (Humira  (2 Syringe))   Patient requested Delivery   Delivery date: 02/13/23   Verified address: [address] [place] [address]   Medication will be filled on 01.29.25.

## 2023-01-30 ENCOUNTER — Encounter: Payer: Self-pay | Admitting: Internal Medicine

## 2023-01-30 ENCOUNTER — Ambulatory Visit (INDEPENDENT_AMBULATORY_CARE_PROVIDER_SITE_OTHER): Payer: 59 | Admitting: Internal Medicine

## 2023-01-30 VITALS — BP 118/60 | HR 72 | Ht 62.0 in | Wt 140.6 lb

## 2023-01-30 DIAGNOSIS — E559 Vitamin D deficiency, unspecified: Secondary | ICD-10-CM

## 2023-01-30 DIAGNOSIS — C73 Malignant neoplasm of thyroid gland: Secondary | ICD-10-CM | POA: Diagnosis not present

## 2023-01-30 DIAGNOSIS — E890 Postprocedural hypothyroidism: Secondary | ICD-10-CM | POA: Diagnosis not present

## 2023-01-30 NOTE — Progress Notes (Addendum)
Patient ID: [name], female   DOB: 09/23/1960, 63 y.o.   MRN: [id number]  HPI  [name] is a 63 y.o.-year-old female, initially referred by Dr. 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  Last visit 6 months ago.  Interim history: She has a history of breast cancer-finished radiation therapy and also, since last visit, she finished chemotherapy 06/11/2022.  She is on anastrozole >> still hot flushes every night. She has fatigue.  She has joint pains - chronic, now back on Celebrex.   Thyroid cancer  Patient initially had a PET scan in 10/2021 during investigation of a pulmonary nodule.  This showed a hyperactive thyroid nodule.  Thyroid ultrasound showed a 2.2 cm right thyroid nodule that met criteria for biopsy.  There was also smaller right mid thyroid nodule, measuring 1.3 cm, that only requires follow-up, and a smaller left thyroid nodule, not worrisome.  She had biopsy of the dominant thyroid nodule, which came positive for papillary thyroid carcinoma.  She had total thyroidectomy by Dr. Gerrit Friends on 01/01/2022 >> stage 2 TNM PTC.  She had RAI treatment.  Reviewed available investigation in detail: PET scan (10/29/2021): Hypermetabolic heterogeneous 19 mm right thyroid nodule, suggest further evaluation with thyroid ultrasound and FNA.  Thyroid U/S (11/08/2021): Parenchymal Echotexture: Mildly heterogenous  Isthmus: 0.2 cm  Right lobe: 4.2 x 1.7 x 2.0 cm  Left lobe: 3.8 x 1.3 x 1.2 cm _________________________________________________________   Nodule # 1:  Location: Right; Mid  Maximum size: 2.2 cm; Other 2 dimensions: 2.1 x 1.7 cm  Composition: solid/almost completely solid (2)  Echogenicity: hypoechoic (2)  Margins: lobulated/irregular (2)  Echogenic foci: macrocalcifications (1)  ACR TI-RADS total points: 7. **Given size (>/= 1.0 cm) and appearance, fine needle aspiration of this highly suspicious nodule should be  considered based on TI-RADS criteria.    _________________________________________________________   Nodule # 2:  Location: Right; Mid  Maximum size: 1.3 cm; Other 2 dimensions: 1.3 x 1.0 cm  Composition: solid/almost completely solid (2)  Echogenicity: hypoechoic (2) ACR TI-RADS total points: 4. *Given size (>/= 1 - 1.4 cm) and appearance, a follow-up ultrasound in 1 year should be considered based on TI-RADS criteria.  _________________________________________________________   Nodule # 3: Small mixed cystic and solid nodule in the left mid gland does not warrant further evaluation.   IMPRESSION: 1. Approximately 2.2 cm TI-RADS category 5 nodule in the right upper gland meets criteria to consider fine-needle aspiration biopsy. Biopsy is recommended. 2. A 1.3 cm TI-RADS category 4 nodule in the right mid gland meets criteria for imaging surveillance. Recommend follow-up ultrasound in 1 year.  FNA (11/14/2021):  FINAL MICROSCOPIC DIAGNOSIS:  - Findings consistent with papillary carcinoma (Bethesda category VI)  SPECIMEN ADEQUACY:  Satisfactory for evaluation   Total thyroidectomy (01/01/2022)-Dr. Gerkin  Tumor Focality: Unifocal Tumor Site: Right lobe Tumor Size: 2.3 x 1.5 x 1.2 cm Histologic Type: Papillary carcinoma, classic and infiltrative follicular subtypes Angioinvasion: Not identified Lymphatic Invasion: Not identified Extrathyroidal Extension: Tumor focally invades perithyroidal soft tissue Margin Status: All margins negative for carcinoma Regional Lymph Node Status:      Number of Lymph Nodes with Tumor: 0      Number of Lymph Nodes Examined: 2      Nodal Level(s) Examined: Level VI Distant Metastasis: Not applicable Pathologic Stage Classification (pTNM, AJCC 8th Edition): pT2b, pN0 Tumor larger than 2 cm, but without lymph node lymphatic, or blood vessel invasion.  Clinical TNM stage II.  04/03/2022: RAI treatment 70.8 mCi I-131  Posttreatment whole-body  scan (04/10/2022): No evidence of thyroid remnant or iodine avid metastatic thyroid cancer.  Lab Results  Component Value Date   THYROGLB 0.1 (L) 07/30/2022   Lab Results  Component Value Date   THGAB <1 07/30/2022   Postsurgical hypothyroidism: -We are using white LT4 tablets due to intolerance to dyes.  Pt is on levothyroxine 100 mcg (50 mcg x 2) 6/7 days and 50 mcg 1/7 days. - in am - fasting - coffee + milk - at least 1h from b'fast - stopped calcium 2x a day - first dose in am  - no iron -  no multivitamins in am - + PPIs -Omeprazole- in am >> moved at  night -she takes this for eosinophilic esophagitis. - stopped Biotin - in B complex   I reviewed pt's thyroid tests: Lab Results  Component Value Date   TSH 0.66 09/23/2022   TSH 2.27 09/04/2022   TSH 0.17 (L) 07/30/2022   TSH 1.492 03/04/2022   TSH 1.77 02/01/2022   TSH 3.08 08/30/2021   TSH 2.65 03/02/2020   TSH 2.96 09/25/2018   TSH 1.37 02/11/2018   TSH 1.32 07/10/2017   FREET4 1.28 09/04/2022   FREET4 1.06 07/30/2022   FREET4 1.5 02/01/2022   FREET4 1.1 05/05/2015    She has no FH of thyroid disorders. No FH of thyroid cancer.  No h/o radiation tx to head or neck.  She had a low calcium after surgery: Lab Results  Component Value Date   PTH 21 02/11/2018   CALCIUM 9.8 09/23/2022   CALCIUM 9.5 04/15/2022   CALCIUM 9.5 03/25/2022   CALCIUM 9.3 03/04/2022   CALCIUM 9.1 02/18/2022   CALCIUM 9.6 02/11/2022   CALCIUM 8.8 (L) 01/02/2022   CALCIUM 9.8 08/30/2021   CALCIUM 8.6 03/02/2020   CALCIUM 9.4 09/25/2018   CALCIUM 9.4 02/11/2018   CALCIUM 9.2 02/11/2018   CALCIUM 8.9 07/10/2017   CALCIUM 9.3 04/05/2010  She started calcium 500 mg (Tums) twice a day after the surgery >> now off.  Vitamin D levels were normal: Lab Results  Component Value Date   VD25OH 28.91 (L) 07/30/2022   VD25OH 67 03/02/2020   VD25OH 60 09/25/2018   VD25OH 32 07/10/2017  She is on vitamin D 50,000 units weekly >> but  ran out >> started 1000 units vitamin D3 daily in 07/2022.  She has a past medical history of Crohn's disease, eosinophilic esophagitis, GERD, OSA, HL, lung nodule - benign.  She had breast lumpectomy 01/01/2022, at the same time as her thyroidectomy.  ROS:+ see HPI + hot flashes at night, despite HRT  Past Medical History:  Diagnosis Date   Anxiety    Allergy induced asthma   Arthritis    Asthma    Breast cancer (HCC)    Crohn disease (HCC)    Depression    GERD (gastroesophageal reflux disease)    History of methicillin resistant staphylococcus aureus (MRSA)    pt denies this.   History of radiation therapy    Left Breast- 05/09/22-06/11/22-Dr. Antony Blackbird   Multiple thyroid nodules    Pneumonia    Port-A-Cath in place 03/25/2022   Sleep apnea    CPAP   Thyroid cancer [facility])    Past Surgical History:  Procedure Laterality Date   ANAL FISSURE REPAIR  2004   BREAST LUMPECTOMY WITH RADIOACTIVE SEED LOCALIZATION Left 01/01/2022   Procedure: LEFT BREAST SEED LUMPECTOMY;  Surgeon: Harriette Bouillon, MD;  Location: [facility] OR;  Service: General;  Laterality: Left;   CESAREAN SECTION     COLONOSCOPY     INSERTION OF MESH N/A 07/26/2020   Procedure: INSERTION OF MESH;  Surgeon: Axel Filler, MD;  Location: [facility] OR;  Service: General;  Laterality: N/A;   LIGAMENT REPAIR Right 11/23/2015   Procedure: right index radial collateral LIGAMENT REPAIR;  Surgeon: Betha Loa, MD;  Location: [facility];  Service: Orthopedics;  Laterality: Right;   PORTACATH PLACEMENT Right 02/07/2022   Procedure: INSERTION PORT-A-CATH;  Surgeon: Harriette Bouillon, MD;  Location: [facility] OR;  Service: General;  Laterality: Right;   RE-EXCISION OF BREAST LUMPECTOMY Left 01/22/2022   Procedure: RE-EXCISION OF LEFT BREAST LUMPECTOMY;  Surgeon: Harriette Bouillon, MD;  Location: [facility];  Service: General;  Laterality: Left;   SENTINEL NODE BIOPSY Left 01/22/2022   Procedure: LEFT SENTINEL NODE  BIOPSY;  Surgeon: Harriette Bouillon, MD;  Location: [facility];  Service: General;  Laterality: Left;   THYROIDECTOMY N/A 01/01/2022   Procedure: TOTAL THYROIDECTOMY WITH LIMITED LYMPH NODE DISSECTION;  Surgeon: Darnell Level, MD;  Location: [facility] OR;  Service: General;  Laterality: N/A;   UPPER ENDOSCOPY W/ ESOPHAGEAL MANOMETRY  10/2021   VENTRAL HERNIA REPAIR N/A 07/26/2020   Procedure: LAPAROSCOPIC VENTRAL HERNIA REPAIR WITH MESH;  Surgeon: Axel Filler, MD;  Location: [facility] OR;  Service: General;  Laterality: N/A;   Social History   Socioeconomic History   Marital status: Married    Spouse name: Not on file   Number of children: 2   Years of education: Not on file   Highest education level: Not on file  Occupational History   Occupation: Self employed  Tobacco Use   Smoking status: Never    Passive exposure: Never   Smokeless tobacco: Never  Vaping Use   Vaping status: Never Used  Substance and Sexual Activity   Alcohol use: Yes    Alcohol/week: 10.0 standard drinks of alcohol    Types: 10 Glasses of wine per week    Comment: 10 glasses/week   Drug use: No   Sexual activity: Yes    Birth control/protection: Post-menopausal  Other Topics Concern   Not on file  Social History Narrative   Not on file   Social Drivers of Health   Financial Resource Strain: Low Risk  (09/17/2022)   Overall Financial Resource Strain (CARDIA)    Difficulty of Paying Living Expenses: Not hard at all  Food Insecurity: No Food Insecurity (09/17/2022)   Hunger Vital Sign    Worried About Running Out of Food in the Last Year: Never true    Ran Out of Food in the Last Year: Never true  Transportation Needs: No Transportation Needs (09/17/2022)   PRAPARE - Administrator, Civil Service (Medical): No    Lack of Transportation (Non-Medical): No  Physical Activity: Sufficiently Active (09/17/2022)   Exercise Vital Sign    Days of Exercise per Week: 5 days    Minutes of Exercise per  Session: 60 min  Stress: Stress Concern Present (09/17/2022)   [employer] of Occupational Health - Occupational Stress Questionnaire    Feeling of Stress : To some extent  Social Connections: Socially Integrated (09/17/2022)   Social Connection and Isolation Panel [NHANES]    Frequency of Communication with Friends and Family: Three times a week    Frequency of Social Gatherings with Friends and Family: Once a week    Attends Religious Services: More than  4 times per year    Active Member of Clubs or Organizations: Yes    Attends Banker Meetings: More than 4 times per year    Marital Status: Married  Catering manager Violence: Not on file   Current Outpatient Medications on File Prior to Visit  Medication Sig Dispense Refill   adalimumab (HUMIRA, 2 SYRINGE,) 40 MG/0.4ML prefilled syringe Inject 0.4 mLs (40 mg total) under the skin every 14 (fourteen) days. 2 each 5   albuterol (VENTOLIN HFA) 108 (90 Base) MCG/ACT inhaler Inhale 2 puffs into the lungs every 6 (six) hours as needed for wheezing or shortness of breath. 6.7 g 3   anastrozole (ARIMIDEX) 1 MG tablet Take 1 tablet (1 mg total) by mouth daily. 90 tablet 3   ascorbic acid (VITAMIN C) 500 MG tablet Take 500 mg by mouth 2 (two) times a week.     bimatoprost (LUMIGAN) 0.01 % SOLN Instill 1 drop into both eyes at bedtime 2.5 mL 10   Budeson-Glycopyrrol-Formoterol (BREZTRI AEROSPHERE) 160-9-4.8 MCG/ACT AERO Inhale 2 puffs into the lungs in the morning and at bedtime. 10.7 g 0   busPIRone (BUSPAR) 7.5 MG tablet Take 1 tablet (7.5 mg total) by mouth 2 (two) times daily. 180 tablet 2   celecoxib (CELEBREX) 100 MG capsule Take 1 capsule (100 mg total) by mouth in the morning and at bedtime. 60 capsule 11   cetirizine (ZYRTEC) 10 MG tablet Take 10 mg by mouth in the morning.     Cholecalciferol (VITAMIN D) 125 MCG (5000 UT) CAPS Take by mouth.     DULoxetine (CYMBALTA) 60 MG capsule Take 1 capsule (60 mg total) by mouth  daily. 90 capsule 1   ezetimibe (ZETIA) 10 MG tablet Take 1 tablet (10 mg total) by mouth daily. 90 tablet 3   fluocinonide ointment (LIDEX) 0.05 % Apply 1 application  topically daily as needed (irritation).     HYDROcodone bit-homatropine (HYCODAN) 5-1.5 MG/5ML syrup Take 5 mLs by mouth every 8 (eight) hours as needed for cough. 120 mL 0   levothyroxine (SYNTHROID) 50 MCG tablet Take 2 tablets (100 mcg total) by mouth daily before breakfast. 180 tablet 3   Magnesium 250 MG TABS Take by mouth.     omeprazole (PRILOSEC) 20 MG capsule Take 1 capsule (20 mg total) by mouth daily. 90 capsule 3   venlafaxine XR (EFFEXOR-XR) 37.5 MG 24 hr capsule Take 1 capsule (37.5 mg total) by mouth daily. 90 capsule 3   No current facility-administered medications on file prior to visit.   Allergies  Allergen Reactions   Gabapentin Itching   Hydrocodone Itching   Oxycodone Hcl Itching    NDC WGNF:[id number]  NDC ONGE:[id number]   Family History  Problem Relation Age of Onset   Alcohol abuse Mother    Alcohol abuse Sister    Angioedema Neg Hx    Allergic rhinitis Neg Hx    Asthma Neg Hx    Atopy Neg Hx    Eczema Neg Hx    Immunodeficiency Neg Hx    Urticaria Neg Hx    Colon cancer Neg Hx    Stomach cancer Neg Hx    Esophageal cancer Neg Hx    Colon polyps Neg Hx   + HL in uncle, aunt  PE: BP 118/60   Pulse 72   Ht 5\' 2"  (1.575 m)   Wt 140 lb 9.6 oz (63.8 kg)   LMP 01/10/2014   SpO2 97%   BMI 25.72 kg/m  Wt Readings from Last 10 Encounters:  01/30/23 140 lb 9.6 oz (63.8 kg)  12/25/22 135 lb (61.2 kg)  12/10/22 140 lb 3.2 oz (63.6 kg)  12/04/22 135 lb (61.2 kg)  11/11/22 139 lb 6 oz (63.2 kg)  09/24/22 136 lb (61.7 kg)  09/19/22 135 lb 9.6 oz (61.5 kg)  09/05/22 134 lb (60.8 kg)  07/31/22 142 lb (64.4 kg)  07/30/22 142 lb (64.4 kg)   Constitutional: normal weight, in NAD Eyes:  EOMI, no exophthalmos ENT: no neck masses palpated, thyroidectomy scar healed, no cervical  lymphadenopathy Cardiovascular: RRR, No MRG Respiratory: CTA B Musculoskeletal: no deformities Skin: no rashes Neurological: no tremor with outstretched hands  ASSESSMENT: 1. Thyroid cancer - see HPI  2. Postsurgical Hypothyroidism  3.  Postsurgical hypocalcemia  PLAN:  1.  Papillary thyroid cancer -Patient with papillary thyroid cancer, status post thyroidectomy by Dr. Gerrit Friends 01/01/2022.  She had both classic and infiltrative type of PTC, with the tumor invading the surrounding thyroid tissue.  RAI treatment was recommended for remnant thyroid ablation and to ablate possible micrometastasis or ETE extrathyroidal extension) sites.  However, we discussed that the most important reason for this is to be able to follow her with thyroglobulin levels afterwards. -She had RAI treatment 04/03/2022 and had a whole-body scan 04/10/2022.  This did not show any abnormal uptake -At last visit, thyroglobulin was barely detectable at 0.1 and ATA antibodies were undetectable -At today's visit, we will repeat the thyroglobulin and ATA antibodies -We discussed about checking a neck ultrasound now, approximately a year after RAI treatment -I will see her back in 1 year  2.  Patient with history of total thyroidectomy for thyroid cancer, now with iatrogenic hypothyroidism - latest thyroid labs reviewed with pt. >> normal: Lab Results  Component Value Date   TSH 0.66 09/23/2022  - she continues on LT4 93 mcg daily (she takes 2 tablets of LT4 50 mcg 6/7 days and 1 tablet 1/7 days), dose decreased at last visit when the TSH was suppressed - pt feels good on this dose.  She still has hot flushes, likely from Arimidex. - we discussed about taking the thyroid hormone every day, with water, >30 minutes before breakfast, separated by >4 hours from acid reflux medications, calcium, iron, multivitamins. Pt. is taking it correctly. - will check thyroid tests today: TSH and fT4 - If labs are abnormal, she will need  to return for repeat TFTs in 1.5 months  3.  Postsurgical hypocalcemia -She had a slightly low calcium after surgery and she was on calcium supplements briefly, but was able to come off -No muscle cramping but does have joint pains -Latest vitamin D level was slightly low 6 months ago: Lab Results  Component Value Date   VD25OH 28.91 (L) 07/30/2022  -At last visit, she had run out of the 50,000 units of vitamin D weekly as she did not have any refills.  At that time, I recommended to start 1000 units vitamin D3 daily.  She is now on 2000 units daily. -Latest total calcium level was normal: Lab Results  Component Value Date   CALCIUM 9.8 09/23/2022  -At today's visit, we will recheck her vitamin D level, but will not repeat a calcium level  Needs refills.  Orders Placed This Encounter  Procedures   US THYROID   TSH   T4, free   Thyroglobulin antibody   Thyroglobulin Level   Vitamin D, 25-hydroxy   Component     Latest  Ref Rng 01/30/2023  TSH     0.40 - 4.50 mIU/L 0.82   Vitamin D, 25-Hydroxy     30 - 100 ng/mL 41   Comment --   T4,Free(Direct)     0.8 - 1.8 ng/dL 1.5   Thyroglobulin     ng/mL 0.1 (L)   Thyroglobulin Ab     < or = 1 IU/mL <1   Thyroid function test and vitamin D level are normal. Thyroglobulin is barely detectable while ATA antibodies are undetectable.  Neck U/S (02/04/2023): The thyroid gland is surgically absent. Evaluation of the right thyroid resection bed demonstrates heterogeneous soft tissue measuring 1.6 x 0.6 x 0.5 cm. Findings suggest residual thyroid tissue. No evidence of resection in the left resection bed.   No abnormal nodularity or lymphadenopathy.   IMPRESSION: Surgical changes of total thyroidectomy with probable residual thyroid tissue in the right resection bed measuring 1.6 x 0.6 x 0.5 cm.   No focal nodularity or abnormal lymphadenopathy.   Carlus Pavlov, MD PhD [facility] Endocrinology

## 2023-01-30 NOTE — Patient Instructions (Signed)
Please continue levothyroxine 100 mcg 6/7 days and 50 mcg 1/7 days.  Take the thyroid hormone every day, with water, at least 30 minutes before breakfast, separated by at least 4 hours from: - acid reflux medications - calcium - iron - multivitamins  Please stop at the lab.  I will order a neck ultrasound for you.  Please expect a call from [facility].  Please return to see me in 1 year.

## 2023-01-31 ENCOUNTER — Encounter: Payer: Self-pay | Admitting: Internal Medicine

## 2023-02-03 LAB — VITAMIN D 25 HYDROXY (VIT D DEFICIENCY, FRACTURES): Vit D, 25-Hydroxy: 41 ng/mL (ref 30–100)

## 2023-02-03 LAB — T4, FREE: Free T4: 1.5 ng/dL (ref 0.8–1.8)

## 2023-02-03 LAB — TSH: TSH: 0.82 m[IU]/L (ref 0.40–4.50)

## 2023-02-03 LAB — THYROGLOBULIN LEVEL: Thyroglobulin: 0.1 ng/mL — ABNORMAL LOW

## 2023-02-03 LAB — THYROGLOBULIN ANTIBODY: Thyroglobulin Ab: <1 [IU]/mL (ref <=1)

## 2023-02-04 ENCOUNTER — Encounter: Payer: Self-pay | Admitting: Internal Medicine

## 2023-02-04 ENCOUNTER — Ambulatory Visit
Admission: RE | Admit: 2023-02-04 | Discharge: 2023-02-04 | Disposition: A | Discharge status: Home / Self Care | Payer: 59 | Source: Ambulatory Visit | Attending: Internal Medicine | Admitting: Internal Medicine

## 2023-02-04 DIAGNOSIS — C73 Malignant neoplasm of thyroid gland: Secondary | ICD-10-CM

## 2023-02-04 DIAGNOSIS — Z85850 Personal history of malignant neoplasm of thyroid: Secondary | ICD-10-CM | POA: Diagnosis not present

## 2023-02-06 ENCOUNTER — Encounter: Payer: Self-pay | Admitting: Family Medicine

## 2023-02-06 ENCOUNTER — Ambulatory Visit: Payer: 59 | Admitting: Family Medicine

## 2023-02-06 ENCOUNTER — Other Ambulatory Visit: Payer: Self-pay

## 2023-02-06 VITALS — BP 124/76 | HR 92 | Ht 62.0 in | Wt 140.0 lb

## 2023-02-06 DIAGNOSIS — G5601 Carpal tunnel syndrome, right upper limb: Secondary | ICD-10-CM | POA: Diagnosis not present

## 2023-02-06 DIAGNOSIS — M79641 Pain in right hand: Secondary | ICD-10-CM

## 2023-02-06 DIAGNOSIS — G5600 Carpal tunnel syndrome, unspecified upper limb: Secondary | ICD-10-CM | POA: Insufficient documentation

## 2023-02-06 DIAGNOSIS — M67442 Ganglion, left hand: Secondary | ICD-10-CM

## 2023-02-06 DIAGNOSIS — M1811 Unilateral primary osteoarthritis of first carpometacarpal joint, right hand: Secondary | ICD-10-CM

## 2023-02-06 DIAGNOSIS — M67441 Ganglion, right hand: Secondary | ICD-10-CM

## 2023-02-06 NOTE — Assessment & Plan Note (Signed)
Patient given injection and tolerated procedure well, discussed icing regimen and home exercises, discussed which activities to do and which ones to avoid.  Increase activity slowly.  Follow-up again in 6 to 8 weeks

## 2023-02-06 NOTE — Patient Instructions (Signed)
Voltaren and Ice Injection today Do prescribed exercises at least 3x a week See you again in 2 months

## 2023-02-06 NOTE — Assessment & Plan Note (Signed)
Will monitor closely.  Did discuss though that may need to consider after bracing and exercises injections if it does not seem to improve.

## 2023-02-06 NOTE — Progress Notes (Signed)
[name] [address] [place] [address] Phone: [phone] Subjective:   [name], am serving as a scribe for Dr. Antoine Primas.  I'm seeing this patient by the request  of:  [name], Luanna Cole, MD  CC: Right ring finger pain  [id number]  [name] is a 63 y.o. female coming in with complaint of right ring finger pain.  Has been quite sometime since we have seen patient.  Patient states had has been hurting for months. Trigger finger on ring finger.    Since we have seen patient has been diagnosed with papillary thyroid carcinoma.  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  Past medical history also significant for breast cancer.  She finished chemotherapy in May 2024.  Has had chronic joint pain and taking Celebrex.   Past medical history is significant for Crohn's disease as well.  Patient's last CT scan without contrast done in May 2024 shows stable pulmonary nodules.  Recommendation was to repeat in 6 to 12 months.  Laboratory workup did show vitamin D at 49 Past Medical History:  Diagnosis Date   Anxiety    Allergy induced asthma   Arthritis    Asthma    Breast cancer (HCC)    Crohn disease (HCC)    Depression    GERD (gastroesophageal reflux disease)    History of methicillin resistant staphylococcus aureus (MRSA)    pt denies this.   History of radiation therapy    Left Breast- 05/09/22-06/11/22-Dr. Antony Blackbird   Multiple thyroid nodules    Pneumonia    Port-A-Cath in place 03/25/2022   Sleep apnea    CPAP   Thyroid cancer [facility])    Past Surgical History:  Procedure Laterality Date   ANAL FISSURE REPAIR  2004   BREAST LUMPECTOMY WITH RADIOACTIVE SEED LOCALIZATION Left 01/01/2022   Procedure: LEFT BREAST SEED LUMPECTOMY;  Surgeon: Harriette Bouillon, MD;  Location: [facility] OR;  Service: General;  Laterality: Left;   CESAREAN SECTION     COLONOSCOPY     INSERTION OF MESH N/A 07/26/2020   Procedure: INSERTION OF MESH;   Surgeon: Axel Filler, MD;  Location: [facility] OR;  Service: General;  Laterality: N/A;   LIGAMENT REPAIR Right 11/23/2015   Procedure: right index radial collateral LIGAMENT REPAIR;  Surgeon: Betha Loa, MD;  Location: [facility];  Service: Orthopedics;  Laterality: Right;   PORTACATH PLACEMENT Right 02/07/2022   Procedure: INSERTION PORT-A-CATH;  Surgeon: Harriette Bouillon, MD;  Location: [facility] OR;  Service: General;  Laterality: Right;   RE-EXCISION OF BREAST LUMPECTOMY Left 01/22/2022   Procedure: RE-EXCISION OF LEFT BREAST LUMPECTOMY;  Surgeon: Harriette Bouillon, MD;  Location: [facility];  Service: General;  Laterality: Left;   SENTINEL NODE BIOPSY Left 01/22/2022   Procedure: LEFT SENTINEL NODE BIOPSY;  Surgeon: Harriette Bouillon, MD;  Location: [facility];  Service: General;  Laterality: Left;   THYROIDECTOMY N/A 01/01/2022   Procedure: TOTAL THYROIDECTOMY WITH LIMITED LYMPH NODE DISSECTION;  Surgeon: Darnell Level, MD;  Location: [facility];  Service: General;  Laterality: N/A;   UPPER ENDOSCOPY W/ ESOPHAGEAL MANOMETRY  10/2021   VENTRAL HERNIA REPAIR N/A 07/26/2020   Procedure: LAPAROSCOPIC VENTRAL HERNIA REPAIR WITH MESH;  Surgeon: Axel Filler, MD;  Location: [facility] OR;  Service: General;  Laterality: N/A;   Social History   Socioeconomic History   Marital status: Married    Spouse name: Not on file   Number of children: 2  Years of education: Not on file   Highest education level: Not on file  Occupational History   Occupation: Self employed  Tobacco Use   Smoking status: Never    Passive exposure: Never   Smokeless tobacco: Never  Vaping Use   Vaping status: Never Used  Substance and Sexual Activity   Alcohol use: Yes    Alcohol/week: 10.0 standard drinks of alcohol    Types: 10 Glasses of wine per week    Comment: 10 glasses/week   Drug use: No   Sexual activity: Yes    Birth control/protection: Post-menopausal  Other Topics Concern    Not on file  Social History Narrative   Not on file   Social Drivers of Health   Financial Resource Strain: Low Risk  (09/17/2022)   Overall Financial Resource Strain (CARDIA)    Difficulty of Paying Living Expenses: Not hard at all  Food Insecurity: No Food Insecurity (09/17/2022)   Hunger Vital Sign    Worried About Running Out of Food in the Last Year: Never true    Ran Out of Food in the Last Year: Never true  Transportation Needs: No Transportation Needs (09/17/2022)   PRAPARE - Administrator, Civil Service (Medical): No    Lack of Transportation (Non-Medical): No  Physical Activity: Sufficiently Active (09/17/2022)   Exercise Vital Sign    Days of Exercise per Week: 5 days    Minutes of Exercise per Session: 60 min  Stress: Stress Concern Present (09/17/2022)   [employer] of Occupational Health - Occupational Stress Questionnaire    Feeling of Stress : To some extent  Social Connections: Socially Integrated (09/17/2022)   Social Connection and Isolation Panel [NHANES]    Frequency of Communication with Friends and Family: Three times a week    Frequency of Social Gatherings with Friends and Family: Once a week    Attends Religious Services: More than 4 times per year    Active Member of [employer] or Organizations: Yes    Attends Engineer, structural: More than 4 times per year    Marital Status: Married   Allergies  Allergen Reactions   Gabapentin Itching   Hydrocodone Itching   Oxycodone Hcl Itching    NDC ZOXW:[id number]  NDC BJYN:[id number]   Family History  Problem Relation Age of Onset   Alcohol abuse Mother    Alcohol abuse Sister    Angioedema Neg Hx    Allergic rhinitis Neg Hx    Asthma Neg Hx    Atopy Neg Hx    Eczema Neg Hx    Immunodeficiency Neg Hx    Urticaria Neg Hx    Colon cancer Neg Hx    Stomach cancer Neg Hx    Esophageal cancer Neg Hx    Colon polyps Neg Hx     Current Outpatient Medications (Endocrine & Metabolic):     levothyroxine (SYNTHROID) 50 MCG tablet, Take 2 tablets (100 mcg total) by mouth daily before breakfast.  Current Outpatient Medications (Cardiovascular):    ezetimibe (ZETIA) 10 MG tablet, Take 1 tablet (10 mg total) by mouth daily.  Current Outpatient Medications (Respiratory):    albuterol (VENTOLIN HFA) 108 (90 Base) MCG/ACT inhaler, Inhale 2 puffs into the lungs every 6 (six) hours as needed for wheezing or shortness of breath.   Budeson-Glycopyrrol-Formoterol (BREZTRI AEROSPHERE) 160-9-4.8 MCG/ACT AERO, Inhale 2 puffs into the lungs in the morning and at bedtime.   cetirizine (ZYRTEC) 10 MG tablet, Take 10 mg by  mouth in the morning.   HYDROcodone bit-homatropine (HYCODAN) 5-1.5 MG/5ML syrup, Take 5 mLs by mouth every 8 (eight) hours as needed for cough.  Current Outpatient Medications (Analgesics):    adalimumab (HUMIRA, 2 SYRINGE,) 40 MG/0.4ML prefilled syringe, Inject 0.4 mLs (40 mg total) under the skin every 14 (fourteen) days.   celecoxib (CELEBREX) 100 MG capsule, Take 1 capsule (100 mg total) by mouth in the morning and at bedtime.   Current Outpatient Medications (Other):    anastrozole (ARIMIDEX) 1 MG tablet, Take 1 tablet (1 mg total) by mouth daily.   ascorbic acid (VITAMIN C) 500 MG tablet, Take 500 mg by mouth 2 (two) times a week.   bimatoprost (LUMIGAN) 0.01 % SOLN, Instill 1 drop into both eyes at bedtime   busPIRone (BUSPAR) 7.5 MG tablet, Take 1 tablet (7.5 mg total) by mouth 2 (two) times daily.   DULoxetine (CYMBALTA) 60 MG capsule, Take 1 capsule (60 mg total) by mouth daily.   fluocinonide ointment (LIDEX) 0.05 %, Apply 1 application  topically daily as needed (irritation).   Magnesium 250 MG TABS, Take by mouth.   omeprazole (PRILOSEC) 20 MG capsule, Take 1 capsule (20 mg total) by mouth daily.   venlafaxine XR (EFFEXOR-XR) 37.5 MG 24 hr capsule, Take 1 capsule (37.5 mg total) by mouth daily.   Reviewed prior external information including notes and imaging  from  primary care provider As well as notes that were available from care everywhere and other healthcare systems.  Past medical history, social, surgical and family history all reviewed in electronic medical record.  No pertanent information unless stated regarding to the chief complaint.   Review of Systems:  No headache, visual changes, nausea, vomiting, diarrhea, constipation, dizziness, abdominal pain, skin rash, fevers, chills, night sweats, weight loss, swollen lymph nodes, body aches, joint swelling, chest pain, shortness of breath, mood changes. POSITIVE muscle aches  Objective  Blood pressure 124/76, pulse 92, height 5\' 2"  (1.575 m), weight 140 lb (63.5 kg), last menstrual period 01/10/2014, SpO2 96%.   General: No apparent distress alert and oriented x3 mood and affect normal, dressed appropriately.  HEENT: Pupils equal, extraocular movements intact  Respiratory: Patient's speak in full sentences and does not appear short of breath  Cardiovascular: No lower extremity edema, non tender, no erythema  Finger exam shows fourth finger on the right side does have some tenderness noted.  Seems to be near the A2 pulley.  Limited muscular skeletal ultrasound was performed and interpreted by Antoine Primas, M  Limited ultrasound shows a patient does have dilatation noted of the median nerve.  The patient also has some narrowing of the [facility] joint.  At the fourth finger does have a large hypoechoic mass noted.  No abnormal blood flow noted.  Likely cyst  Procedure: Real-time Ultrasound Guided Injection of the left fourth flexor tendon sheath cyst Device: GE Logiq Q7 Ultrasound guided injection is preferred based studies that show increased duration, increased effect, greater accuracy, decreased procedural pain, increased response rate, and decreased cost with ultrasound guided versus blind injection.  Verbal informed consent obtained.  Time-out conducted.  Noted no overlying erythema,  induration, or other signs of local infection.  Skin prepped in a sterile fashion.  Local anesthesia: Topical Ethyl chloride.  With sterile technique and under real time ultrasound guidance: With a 25-gauge half inch needle injected with 0.5 cc of 0.5% Marcaine and 0.5 cc of Kenalog 40 mg/mL Completed without difficulty  Pain immediately resolved suggesting accurate placement  of the medication.  Advised to call if fevers/chills, erythema, induration, drainage, or persistent bleeding.  Impression: Technically successful ultrasound guided injection.    Impression and Recommendations:    The above documentation has been reviewed and is accurate and complete Judi Saa, DO

## 2023-02-06 NOTE — Assessment & Plan Note (Signed)
Will try bracing, topical anti-inflammatories and icing regimen.  Worsening pain consider injection.

## 2023-02-07 ENCOUNTER — Encounter: Payer: Self-pay | Admitting: Internal Medicine

## 2023-02-10 ENCOUNTER — Ambulatory Visit: Payer: 59 | Attending: Hematology and Oncology

## 2023-02-10 VITALS — Wt 141.1 lb

## 2023-02-10 DIAGNOSIS — Z483 Aftercare following surgery for neoplasm: Secondary | ICD-10-CM | POA: Insufficient documentation

## 2023-02-10 NOTE — Telephone Encounter (Signed)
Sent 2nd request for results

## 2023-02-10 NOTE — Therapy (Signed)
OUTPATIENT PHYSICAL THERAPY SOZO SCREENING NOTE   Patient Name: [name] MRN: [id number] DOB:[birth year], 63 y.o., female Today's Date: 02/10/2023  PCP: Margaree Mackintosh, MD REFERRING PROVIDER: Serena Croissant, MD   PT End of Session - 02/10/23 1554     Visit Number 11   # unchanged due to screen only   PT Start Time 1552    PT Stop Time 1556    PT Time Calculation (min) 4 min    Activity Tolerance Patient tolerated treatment well    Behavior During Therapy WFL for tasks assessed/performed             Past Medical History:  Diagnosis Date   Anxiety    Allergy induced asthma   Arthritis    Asthma    Breast cancer (HCC)    Crohn disease (HCC)    Depression    GERD (gastroesophageal reflux disease)    History of methicillin resistant staphylococcus aureus (MRSA)    pt denies this.   History of radiation therapy    Left Breast- 05/09/22-06/11/22-Dr. Antony Blackbird   Multiple thyroid nodules    Pneumonia    Port-A-Cath in place 03/25/2022   Sleep apnea    CPAP   Thyroid cancer [facility])    Past Surgical History:  Procedure Laterality Date   ANAL FISSURE REPAIR  2004   BREAST LUMPECTOMY WITH RADIOACTIVE SEED LOCALIZATION Left 01/01/2022   Procedure: LEFT BREAST SEED LUMPECTOMY;  Surgeon: Harriette Bouillon, MD;  Location: [facility] OR;  Service: General;  Laterality: Left;   CESAREAN SECTION     COLONOSCOPY     INSERTION OF MESH N/A 07/26/2020   Procedure: INSERTION OF MESH;  Surgeon: Axel Filler, MD;  Location: [facility] OR;  Service: General;  Laterality: N/A;   LIGAMENT REPAIR Right 11/23/2015   Procedure: right index radial collateral LIGAMENT REPAIR;  Surgeon: Betha Loa, MD;  Location: [facility];  Service: Orthopedics;  Laterality: Right;   PORTACATH PLACEMENT Right 02/07/2022   Procedure: INSERTION PORT-A-CATH;  Surgeon: Harriette Bouillon, MD;  Location: [facility] OR;  Service: General;  Laterality: Right;   RE-EXCISION OF BREAST LUMPECTOMY Left 01/22/2022    Procedure: RE-EXCISION OF LEFT BREAST LUMPECTOMY;  Surgeon: Harriette Bouillon, MD;  Location: [facility];  Service: General;  Laterality: Left;   SENTINEL NODE BIOPSY Left 01/22/2022   Procedure: LEFT SENTINEL NODE BIOPSY;  Surgeon: Harriette Bouillon, MD;  Location: [facility];  Service: General;  Laterality: Left;   THYROIDECTOMY N/A 01/01/2022   Procedure: TOTAL THYROIDECTOMY WITH LIMITED LYMPH NODE DISSECTION;  Surgeon: Darnell Level, MD;  Location: [facility] OR;  Service: General;  Laterality: N/A;   UPPER ENDOSCOPY W/ ESOPHAGEAL MANOMETRY  10/2021   VENTRAL HERNIA REPAIR N/A 07/26/2020   Procedure: LAPAROSCOPIC VENTRAL HERNIA REPAIR WITH MESH;  Surgeon: Axel Filler, MD;  Location: [facility] OR;  Service: General;  Laterality: N/A;   Patient Active Problem List   Diagnosis Date Noted   Ganglion cyst of finger of right hand 02/06/2023   Arthritis of carpometacarpal [facility]) joint of right thumb 02/06/2023   Carpal tunnel syndrome 02/06/2023   Malignant neoplasm of upper-inner quadrant of left breast in female, estrogen receptor positive (HCC) 01/09/2022   Postsurgical hypothyroidism 01/04/2022   Multiple thyroid nodules 12/20/2021   Papillary thyroid carcinoma (HCC) 12/20/2021   Nodule of lower lobe of right lung 10/17/2021   Left upper lobe pulmonary nodule 10/17/2021   Acromioclavicular sprain, left, initial encounter 07/27/2021   Tibialis posterior tendinitis, right  06/15/2020   Piriformis syndrome of left side 06/15/2020   Umbilical hernia 03/21/2020   Lumbar radiculopathy 07/09/2019   Contusion of right hip and thigh 04/20/2019   Loss of transverse plantar arch 02/11/2019   Sprain of iliolumbar ligament 10/29/2018   Piriformis syndrome of right side 09/30/2018   Nonallopathic lesion of sacral region 09/30/2018   Nonallopathic lesion of lumbosacral region 09/30/2018   Nonallopathic lesion of thoracic region 09/30/2018   Peroneal tendinitis of left lower extremity  02/11/2018   Polyarthralgia 02/11/2018   Patellofemoral syndrome of right knee 02/11/2018   Gastroesophageal reflux disease 01/16/2017   Cough variant asthma 01/16/2017   Noncompliance with medication treatment due to intermittent use of medication 01/16/2017   Allergic rhinoconjunctivitis 11/08/2015   Sinobronchitis 10/03/2015   OSA (obstructive sleep apnea) 12/14/2014   Crohn's disease of colon (HCC) 05/24/2014   Depression 02/01/2014   Disorder of intestine 12/17/2010   Anorectal polyp 12/17/2010   Glaucoma 01/14/2010   Dry skin dermatitis 01/15/2008   Crohn's disease (HCC) 01/14/2005    REFERRING DIAG: left breast cancer at risk for lymphedema  THERAPY DIAG: Aftercare following surgery for neoplasm  PERTINENT HISTORY: Patient was diagnosed on 01/01/2022 with 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 It measures 1.5 cm and is located in the upper inner quadrant with IDC only being present in lateral margin. It is ER/PR positive and HER2 negative with a Ki-67 of 5%. Thyroidectomy was also performed at the same time and 2 lymph nodes removed (nodes were negative) and all gross disease removed. Thyroid cancer will be treated with radioactive iodine   PRECAUTIONS: left UE Lymphedema risk, None  SUBJECTIVE: Pt returns for her 3 month L-Dex screen.   PAIN:  Are you having pain? No  SOZO SCREENING: Patient was assessed today using the SOZO machine to determine the lymphedema index score. This was compared to her baseline score. It was determined that she is within the recommended range when compared to her baseline and no further action is needed at this time. She will continue SOZO screenings. These are done every 3 months for 2 years post operatively followed by every 6 months for 2 years, and then annually.   L-DEX FLOWSHEETS - 02/10/23 1500       L-DEX LYMPHEDEMA SCREENING   Measurement Type Unilateral    L-DEX MEASUREMENT EXTREMITY Upper Extremity     POSITION  Standing    DOMINANT SIDE Right    At Risk Side Left    BASELINE SCORE (UNILATERAL) 2.8    L-DEX SCORE (UNILATERAL) 3.7    VALUE CHANGE (UNILAT) 0.9               Hermenia Bers, PTA 02/10/2023, 3:55 PM

## 2023-02-11 ENCOUNTER — Encounter: Payer: Self-pay | Admitting: Internal Medicine

## 2023-02-11 ENCOUNTER — Encounter: Payer: Self-pay | Admitting: Surgery

## 2023-02-11 NOTE — Telephone Encounter (Signed)
Received the Bone Density and mammogram today and mailed patient a copy.

## 2023-02-26 ENCOUNTER — Ambulatory Visit: Payer: 59 | Admitting: Family Medicine

## 2023-02-28 ENCOUNTER — Ambulatory Visit (HOSPITAL_BASED_OUTPATIENT_CLINIC_OR_DEPARTMENT_OTHER): Payer: 59 | Admitting: Pulmonary Disease

## 2023-02-28 ENCOUNTER — Encounter (HOSPITAL_BASED_OUTPATIENT_CLINIC_OR_DEPARTMENT_OTHER): Payer: Self-pay | Admitting: Pulmonary Disease

## 2023-02-28 VITALS — BP 122/74 | HR 66 | Ht 62.0 in | Wt 139.5 lb

## 2023-02-28 DIAGNOSIS — J45991 Cough variant asthma: Secondary | ICD-10-CM

## 2023-02-28 DIAGNOSIS — R911 Solitary pulmonary nodule: Secondary | ICD-10-CM | POA: Diagnosis not present

## 2023-02-28 DIAGNOSIS — G4733 Obstructive sleep apnea (adult) (pediatric): Secondary | ICD-10-CM

## 2023-02-28 NOTE — Assessment & Plan Note (Signed)
1 year follow up CT in 11/2023, presumed benign due to stability

## 2023-02-28 NOTE — Patient Instructions (Addendum)
X Ct chest in nov 2024  X Referral to sleep dentist - Dr Althea Grimmer or Dr Emeline General  Call me after dental device has been adjusted for home sleep test  Trial of chlortrimeton 4mg  instead of Zyrtec at bedtime for post nbasal drip - as needed FLonase OTC 1 spray each nare

## 2023-02-28 NOTE — Assessment & Plan Note (Signed)
Referral to sleep dentist - Dr Althea Grimmer or Dr Emeline General  Call me after dental device has been adjusted for home sleep test

## 2023-02-28 NOTE — Assessment & Plan Note (Signed)
Trial of chlortrimeton 4mg  instead of Zyrtec at bedtime for post nbasal drip - as needed FLonase OTC 1 spray each nare for PND  Breztri as needed only

## 2023-02-28 NOTE — Progress Notes (Signed)
   Subjective:    Patient ID: [name], female    DOB: 1960-09-15, 63 y.o.   MRN: [id number]  HPI  63 yo woman with Mild OSA & lung nodule She had a CT scan of the chest for cardiac scoring completed on 10/15/2021.  This revealed a part solid right lower lobe 2.7 cm diameter nodule with a 1.2 cm solid component , resolved on FU scan, stable nodules largest left lower lobe She started Breztri short term for a bout of  asthmatic bronchitis   I am seeing her after many years.  Interim she had breast cancer and is now on antiestrogen therapy. She complains of hot flashes through the night and this makes wearing the CPAP difficult.  She has been very compliant with the CPAP since her initial diagnosis in 2016 and this has certainly helped improve her daytime somnolence and fatigue.  She inquires about dental appliance.  Asthma symptoms are well-controlled, she only uses Breztri as needed especially during the winter.  Albuterol usage is seldom. She reports persistent postnasal drip, she uses Zyrtec all year-round  PMH : Crohn's disease Allergic rhinitis Breast CA s/p chemoRT 05/2022 , on antiestrogen therapy with anastrozole   Significant tests/ events   HST 11/2014 AHI 12/hr , SaO2 86%   10/ 2023 CT cors : 2.7 mm groundglass subsolid lesion with a 1.2 cm solid component 11/2022 CT chest resolved nodule, stable 4 mm nodule along left hemidiaphragm  PFTs 10/2022 nml   Review of Systems neg for any significant sore throat, dysphagia, itching, sneezing, nasal congestion or excess/ purulent secretions, fever, chills, sweats, unintended wt loss, pleuritic or exertional cp, hempoptysis, orthopnea pnd or change in chronic leg swelling. Also denies presyncope, palpitations, heartburn, abdominal pain, nausea, vomiting, diarrhea or change in bowel or urinary habits, dysuria,hematuria, rash, arthralgias, visual complaints, headache, numbness weakness or ataxia.     Objective:   Physical  Exam  Gen. Pleasant, well-nourished, in no distress ENT - no thrush, no pallor/icterus,no post nasal drip, 1 mmoverbite Neck: No JVD, no thyromegaly, no carotid bruits Lungs: no use of accessory muscles, no dullness to percussion, clear without rales or rhonchi  Cardiovascular: Rhythm regular, heart sounds  normal, no murmurs or gallops, no peripheral edema Musculoskeletal: No deformities, no cyanosis or clubbing        Assessment & Plan:

## 2023-03-03 ENCOUNTER — Other Ambulatory Visit: Payer: Self-pay | Admitting: Gastroenterology

## 2023-03-04 ENCOUNTER — Other Ambulatory Visit (HOSPITAL_COMMUNITY): Payer: Self-pay

## 2023-03-04 ENCOUNTER — Other Ambulatory Visit: Payer: Self-pay

## 2023-03-04 MED ORDER — OMEPRAZOLE 20 MG PO CPDR
20.0000 mg | DELAYED_RELEASE_CAPSULE | Freq: Every day | ORAL | 1 refills | Status: DC
  Filled 2023-03-04: qty 90, 90d supply, fill #0
  Filled 2023-05-29 – 2023-06-02 (×2): qty 90, 90d supply, fill #1

## 2023-03-07 ENCOUNTER — Other Ambulatory Visit (HOSPITAL_COMMUNITY): Payer: Self-pay

## 2023-03-07 MED ORDER — INFLUENZA VIRUS VACC SPLIT PF (FLUZONE) 0.5 ML IM SUSY
0.5000 mL | PREFILLED_SYRINGE | Freq: Once | INTRAMUSCULAR | 0 refills | Status: AC
  Filled 2023-03-07: qty 0.5, 1d supply, fill #0

## 2023-03-07 MED ORDER — ZOSTER VAC RECOMB ADJUVANTED 50 MCG/0.5ML IM SUSR
0.5000 mL | Freq: Once | INTRAMUSCULAR | 0 refills | Status: AC
  Filled 2023-03-07 – 2023-03-13 (×2): qty 0.5, 1d supply, fill #0

## 2023-03-10 ENCOUNTER — Inpatient Hospital Stay: Payer: 59 | Attending: Hematology and Oncology | Admitting: Hematology and Oncology

## 2023-03-10 ENCOUNTER — Telehealth: Payer: Self-pay

## 2023-03-10 VITALS — BP 132/67 | HR 72 | Temp 97.5°F | Resp 18 | Ht 62.0 in | Wt 140.7 lb

## 2023-03-10 DIAGNOSIS — Z170 Estrogen receptor positive status [ER+]: Secondary | ICD-10-CM

## 2023-03-10 DIAGNOSIS — R413 Other amnesia: Secondary | ICD-10-CM | POA: Diagnosis not present

## 2023-03-10 DIAGNOSIS — Z1732 Human epidermal growth factor receptor 2 negative status: Secondary | ICD-10-CM | POA: Insufficient documentation

## 2023-03-10 DIAGNOSIS — Z923 Personal history of irradiation: Secondary | ICD-10-CM | POA: Diagnosis not present

## 2023-03-10 DIAGNOSIS — R232 Flushing: Secondary | ICD-10-CM | POA: Diagnosis not present

## 2023-03-10 DIAGNOSIS — Z1721 Progesterone receptor positive status: Secondary | ICD-10-CM | POA: Diagnosis not present

## 2023-03-10 DIAGNOSIS — Z9221 Personal history of antineoplastic chemotherapy: Secondary | ICD-10-CM | POA: Insufficient documentation

## 2023-03-10 DIAGNOSIS — C50212 Malignant neoplasm of upper-inner quadrant of left female breast: Secondary | ICD-10-CM

## 2023-03-10 DIAGNOSIS — R4189 Other symptoms and signs involving cognitive functions and awareness: Secondary | ICD-10-CM | POA: Diagnosis not present

## 2023-03-10 DIAGNOSIS — R0789 Other chest pain: Secondary | ICD-10-CM | POA: Diagnosis not present

## 2023-03-10 DIAGNOSIS — Z79811 Long term (current) use of aromatase inhibitors: Secondary | ICD-10-CM | POA: Insufficient documentation

## 2023-03-10 NOTE — Research (Signed)
 [id number]: COGNITIVE TRAINING FOR CANCER RELATED COGNITIVE IMPAIRMENT IN BREAST CANCER SURVIVORS: A MULTI-CENTER RANDOMIZED DOUBLE- BLINDED CONTROLLED TRIAL   Patient [name] was identified by Dr. Pamelia Hoit as a potential candidate for the above listed study.  This Clinical Research Nurse met with REILLY MOLCHAN, [id number], on 03/10/23 in a manner and location that ensures patient privacy to discuss participation in the above listed research study.  Patient is Unaccompanied.  A copy of the informed consent document and separate HIPAA Authorization was provided to the patient.  Patient reads, speaks, and understands [place].    Patient was provided with the business card of this Nurse and encouraged to contact the research team with any questions.  Patient was provided the option of taking informed consent documents home to review and was encouraged to review at their convenience with their support network, including other care providers. Patient is comfortable with making a decision regarding study participation today.  As outlined in the informed consent form, this Nurse and Nathen May Aziz discussed the purpose of the research study, the investigational nature of the study, study procedures and requirements for study participation, potential risks and benefits of study participation, as well as alternatives to participation. This study is not blinded. The patient understands participation is voluntary and they may withdraw from study participation at any time.  Each study arm was reviewed, and randomization discussed.  This study does not involve an investigational drug or device. This study does not involve a placebo. Patient understands enrollment is pending full eligibility review.   Confidentiality and how the patient's information will be used as part of study participation were discussed.  Patient was informed there is reimbursement provided for their time and effort spent on trial  participation.  The patient is encouraged to discuss research study participation with their insurance provider to determine what costs they may incur as part of study participation, including research related injury.    All questions were answered to patient's satisfaction.  The informed consent and separate HIPAA Authorization was reviewed page by page.  The patient's mental and emotional status is appropriate to provide informed consent, and the patient verbalizes an understanding of study participation.  Patient has agreed to participate in the above listed research study and has voluntarily signed the informed consent version dated 12-13-21 and separate HIPAA Authorization, version [place] IRB version date 10-14-22  on 03/10/23 at 10:30AM.  The patient was provided with a copy of the signed informed consent form and separate HIPAA Authorization for their reference.  No study specific procedures were obtained prior to the signing of the informed consent document.  Approximately 25 minutes were spent with the patient reviewing the informed consent documents.  Patient was not requested to complete a Release of Information form.  Research RN saw in clinic room and performed provided prescreening for this study. Pt passed prescreening. RN will continue to look at eligibility and call back pt on Thursday 03-13-23 to follow up with pt on participation in study. Pt knows to reach out with any questions or concerns.   Zerita Boers BSN RN Clinical Research Nurse [facility] Direct Dial: [phone] 03/10/2023  11:06 AM

## 2023-03-10 NOTE — Assessment & Plan Note (Signed)
 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) 0/2 lymph nodes) 01/22/2022: Margin reexcision: Benign, 0/1 sentinel lymph node negative 01/23/2022: Oncotype DX recurrence score 31 (19% risk of distant recurrence at 9 years)   Treatment plan: Adjuvant chemotherapy with Taxotere and Cytoxan every 3 weeks x 4 completed 04/15/2022 Adjuvant radiation therapy to be completed 06/07/2022 Adjuvant antiestrogen therapy with anastrozole 1 mg daily to start 06/15/2022 ----------------------------------------------------------------------- Anastrozole toxicities: Arthralgias: Gradually improved Hot flashes  Breast cancer surveillance: Breast exam 03/10/2023: Benign Mammogram 12/09/2022: Benign breast density category B DEXA scan [facility] 12/09/2022: T-score +0.9: Normal CT chest 12/09/2022: No suspicious pulmonary nodules Guardant reveal: Negative  Return to clinic in 1 year for follow-up

## 2023-03-10 NOTE — Research (Signed)
 DCP-001: Use of a Clinical Trial Screening Tool to Address Cancer Health Disparities in the [employer] Community Oncology Research Program [facility])  Patient [name] was identified by Dr. Pamelia Hoit as a potential candidate for the above listed study.  This Clinical Research Nurse met with SINTIA MCKISSIC, [id number], on 03/10/23 in a manner and location that ensures patient privacy to discuss participation in the above listed research study.  Patient was called to obtain informed consent and gather necessary data for this study.  Pt declined a copy of the informed consent, research Rn utilized provided telephone script to inform pt about this study.  Patient reads, speaks, and understands [place].    As outlined in the informed consent form, this Nurse and Nathen May Julian discussed the purpose of the research study, the investigational nature of the study, study procedures and requirements for study participation, potential risks and benefits of study participation, as well as alternatives to participation. This study is not blinded. The patient understands participation is voluntary and they may withdraw from study participation at any time. Patient understands enrollment is pending full eligibility review.   Confidentiality and how the patient's information will be used as part of study participation were discussed.  Patient was informed there is not reimbursement provided for their time and effort spent on trial participation. After research RN went over the provided phone script with pt, she agreed to participate in the study and study questions/information was completed.   All questions were answered to patient's satisfaction. The patient's mental and emotional status is appropriate to provide informed consent, and the patient verbalizes an understanding of study participation.  Patient has agreed to participate in the above listed research study and has voluntarily.  Approximately 20 minutes were  spent with the patient reviewing the informed consent documents.    Pt had no questions or concerns at time of consent and necessary information gathering. Pt was thanked for her participation and encouraged to reach out with any concerns.    This Nurse has reviewed this patient's inclusion and exclusion criteria and confirmed REGGIE WELGE is eligible for study participation.  Patient will continue with enrollment.  Eligibility confirmed by treating investigator, who also agrees that patient should proceed with enrollment.  Zerita Boers BSN RN Clinical Research Nurse [facility] Direct Dial: [phone] 03/10/2023  2:33 PM

## 2023-03-10 NOTE — Telephone Encounter (Signed)
 [id number]: COGNITIVE TRAINING FOR CANCER RELATED COGNITIVE IMPAIRMENT IN BREAST CANCER SURVIVORS: A MULTI-CENTER RANDOMIZED DOUBLE- BLINDED CONTROLLED TRIAL   Rn attempted to call pt about ineligibility for this study but was unable to reach her. Rn left message for pt with call back details. RN will await a call back from this pt and if none by Thursday, will call back as previously scheduled.   Zerita Boers BSN RN Clinical Research Nurse [facility] Direct Dial: [phone] 03/10/2023  11:47 AM

## 2023-03-10 NOTE — Progress Notes (Signed)
 Patient Care Team: Margaree Mackintosh, MD as PCP - General (Internal Medicine) Serena Croissant, MD as Consulting Physician (Hematology and Oncology) Antony Blackbird, MD as Consulting Physician (Radiation Oncology) Harriette Bouillon, MD as Consulting Physician (General Surgery)  DIAGNOSIS:  Encounter Diagnosis  Name Primary?   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) 0/2 lymph nodes)   01/16/2022 Cancer Staging   Staging form: Breast, AJCC 8th Edition - Clinical: Stage IA (cT1c, cN0, cM0, G2, ER+, PR+, HER2-) - Signed by Serena Croissant, MD on 01/16/2022 Histologic grading system: 3 grade system   01/22/2022 Surgery   Margin reexcision surgery: Benign margins, sentinel lymph node 0/1   01/22/2022 Surgery   Re excision and SLN biopsy, negative   01/23/2022 Oncotype testing   Recurrence score: 31 (19% distant recurrence risk at 9 years)   02/11/2022 - 04/17/2022 Chemotherapy   Patient is on Treatment Plan : BREAST TC q21d     05/10/2022 - 06/07/2022 Radiation Therapy   Adjuvant radiation   06/2022 -  Anti-estrogen oral therapy   Anastrozole daily     CHIEF COMPLIANT: Follow-up on anastrozole therapy  HISTORY OF PRESENT ILLNESS:   History of Present Illness The patient, a cancer survivor, presents with ongoing memory issues and hot flashes following chemotherapy. She reports struggling with stamina, endurance, and memory on a daily basis. She expresses interest in a  trial involving memory improvement games designed to stimulate the brain and potentially improve memory function.  The patient also experiences occasional sharp chest pains, a common symptom post-chemotherapy. She has switched medications for hot flashes from Duloxetine to Effexor, but reports not feeling as well on the new medication. She has also tried Replens for vaginal dryness, a symptom potentially related to menopause and the medications she is taking.  The patient has a strong bone density, with a T score of +0.9, indicating her bones are stronger than those of an average 63 year old. Her mammogram in November was normal.     ALLERGIES:  is allergic to gabapentin, hydrocodone, and oxycodone hcl.  MEDICATIONS:  Current Outpatient Medications  Medication Sig Dispense Refill   adalimumab (HUMIRA, 2 SYRINGE,) 40 MG/0.4ML prefilled syringe Inject 0.4 mLs (40 mg total) under the skin every 14 (fourteen) days. 2 each 5   albuterol (VENTOLIN HFA) 108 (90 Base) MCG/ACT inhaler Inhale 2 puffs into the lungs every 6 (six) hours as needed for wheezing or shortness of breath. 6.7 g 3   anastrozole (ARIMIDEX) 1 MG tablet Take 1 tablet (1 mg total) by mouth daily. 90 tablet 3   ascorbic acid (VITAMIN C) 500 MG tablet Take 500 mg by mouth 2 (two) times a week.     bimatoprost (LUMIGAN) 0.01 % SOLN Instill 1 drop into both eyes at bedtime 2.5 mL 10   busPIRone (BUSPAR) 7.5 MG tablet Take 1 tablet (7.5 mg total) by mouth 2 (two) times daily. 180 tablet 2   celecoxib (CELEBREX) 100 MG capsule Take 1 capsule (100 mg total) by mouth in the morning  and at bedtime. 60 capsule 11   cetirizine (ZYRTEC) 10 MG tablet Take 10 mg by mouth in the morning.     ezetimibe (ZETIA) 10 MG tablet Take 1 tablet (10 mg total) by mouth daily. 90 tablet 3   fluocinonide ointment (LIDEX) 0.05 % Apply 1 application  topically daily as needed (irritation).     levothyroxine (SYNTHROID) 50 MCG tablet Take 2 tablets (100 mcg  total) by mouth daily before breakfast. 180 tablet 3   Magnesium 250 MG TABS Take by mouth.     omeprazole (PRILOSEC) 20 MG capsule Take 1 capsule (20 mg total) by mouth daily. 90 capsule 1   venlafaxine XR (EFFEXOR-XR) 37.5 MG 24 hr capsule Take 1 capsule (37.5 mg total) by mouth daily. 90 capsule 3   Budeson-Glycopyrrol-Formoterol (BREZTRI AEROSPHERE) 160-9-4.8 MCG/ACT AERO Inhale 2 puffs into the lungs in the morning and at bedtime. (Patient not taking: Reported on 03/10/2023) 10.7 g 0   DULoxetine (CYMBALTA) 60 MG capsule Take 1 capsule (60 mg total) by mouth daily. (Patient not taking: Reported on 03/10/2023) 90 capsule 1   HYDROcodone bit-homatropine (HYCODAN) 5-1.5 MG/5ML syrup Take 5 mLs by mouth every 8 (eight) hours as needed for cough. (Patient not taking: Reported on 03/10/2023) 120 mL 0   No current facility-administered medications for this visit.    PHYSICAL EXAMINATION: ECOG PERFORMANCE STATUS: 1 - Symptomatic but completely ambulatory  Vitals:   03/10/23 0945  BP: 132/67  Pulse: 72  Resp: 18  Temp: (!) 97.5 F (36.4 C)  SpO2: 100%   Filed Weights   03/10/23 0945  Weight: 140 lb 11.2 oz (63.8 kg)    Physical Exam   (exam performed in the presence of a chaperone)  LABORATORY DATA:  I have reviewed the data as listed    Latest Ref Rng & Units 09/23/2022   12:07 PM 04/15/2022    8:27 AM 03/25/2022    9:25 AM  CMP  Glucose 65 - 99 mg/dL 93  96  90   BUN 7 - 25 mg/dL 12  16  17    Creatinine 0.50 - 1.05 mg/dL 1.61  0.96  0.45   Sodium 135 - 146 mmol/L 139  139  138   Potassium 3.5 - 5.3 mmol/L 4.4  3.8  3.7   Chloride 98 - 110 mmol/L 103  108  105   CO2 20 - 32 mmol/L 28  23  26    Calcium 8.6 - 10.4 mg/dL 9.8  9.5  9.5   Total Protein 6.1 - 8.1 g/dL 7.0  6.8  6.9   Total Bilirubin 0.2 - 1.2 mg/dL 0.5  0.4  0.5   Alkaline Phos 38 - 126 U/L  53  55   AST 10 - 35 U/L 17  19  18    ALT 6 - 29 U/L 13  21  22      Lab Results  Component Value Date   WBC 4.5  09/23/2022   HGB 12.0 09/23/2022   HCT 36.5 09/23/2022   MCV 91.0 09/23/2022   PLT 325 09/23/2022   NEUTROABS 2,016 09/23/2022    ASSESSMENT & PLAN:  Malignant neoplasm of upper-inner quadrant of left breast in female, estrogen receptor positive (HCC) 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  She is going to try over-the-counter supplement for hot flashes.  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 Return to clinic in 1 year for follow-up   ------------------------------------- Assessment and Plan Assessment & Plan Post-chemotherapy cognitive impairment Patient reports ongoing struggles with memory and stamina. Discussed potential participation in a trial involving memory improvement games. -Perform initial screening for trial  eligibility. -If eligible, enroll in trial and monitor progress.  Intermittent chest pain Patient reports occasional sharp chest pain, common post-chemotherapy symptom. -Continue monitoring.  Bone health Recent bone density scan showed excellent results, with a T-score of +0.9, indicating strong bones and low fracture risk. -No intervention needed at this time.  Breast health Recent mammogram in November was normal. -Perform routine breast exam today.  Hot flashes Patient reports hot flashes, currently on low-dose Effexor. Discussed potential increase in Effexor dose or addition of other medications. -Trial of Escoran as supplement. -Consider increasing Effexor dose if no improvement.  Vaginal dryness Patient reports vaginal dryness, currently using Replens. -Consider use of coconut oil as a natural remedy. -Continue use of Replens and monitor for improvement.      No orders of the defined types were placed in this encounter.  The patient has a good understanding of the overall plan. she agrees with it. she will call with any problems that may develop before the next visit here. Total time spent: 30 mins including face to face time and time spent for planning, charting and co-ordination of care   Tamsen Meek, MD 03/10/23

## 2023-03-11 ENCOUNTER — Telehealth: Payer: Self-pay | Admitting: Hematology and Oncology

## 2023-03-11 NOTE — Telephone Encounter (Signed)
 Scheduled appointment per 2/24 los. Patient is aware of the made appointment.

## 2023-03-12 ENCOUNTER — Other Ambulatory Visit: Payer: Self-pay | Admitting: Pharmacy Technician

## 2023-03-12 ENCOUNTER — Other Ambulatory Visit: Payer: Self-pay

## 2023-03-12 NOTE — Progress Notes (Signed)
 Specialty Pharmacy Refill Coordination Note  [name] is a 63 y.o. female contacted today regarding refills of specialty medication(s) Adalimumab (Humira (2 Syringe))   Patient requested Delivery   Delivery date: 04/01/23   Verified address: [address]  [place]   Medication will be filled on 03/31/23.

## 2023-03-13 ENCOUNTER — Other Ambulatory Visit (HOSPITAL_COMMUNITY): Payer: Self-pay

## 2023-03-18 ENCOUNTER — Other Ambulatory Visit (HOSPITAL_COMMUNITY): Payer: Self-pay

## 2023-03-18 DIAGNOSIS — F418 Other specified anxiety disorders: Secondary | ICD-10-CM | POA: Diagnosis not present

## 2023-03-18 DIAGNOSIS — N951 Menopausal and female climacteric states: Secondary | ICD-10-CM | POA: Diagnosis not present

## 2023-03-18 MED ORDER — DULOXETINE HCL 60 MG PO CPEP
60.0000 mg | ORAL_CAPSULE | Freq: Every day | ORAL | 3 refills | Status: AC
  Filled 2023-03-18: qty 90, 90d supply, fill #0
  Filled 2023-06-16: qty 90, 90d supply, fill #1
  Filled 2023-09-23: qty 90, 90d supply, fill #2
  Filled 2023-12-18 – 2023-12-30 (×2): qty 90, 90d supply, fill #3

## 2023-03-21 ENCOUNTER — Ambulatory Visit: Admitting: Internal Medicine

## 2023-03-21 ENCOUNTER — Encounter: Payer: Self-pay | Admitting: Internal Medicine

## 2023-03-21 ENCOUNTER — Other Ambulatory Visit (HOSPITAL_COMMUNITY): Payer: Self-pay

## 2023-03-21 VITALS — BP 102/80 | Temp 98.1°F | Ht 62.0 in | Wt 140.0 lb

## 2023-03-21 DIAGNOSIS — J0100 Acute maxillary sinusitis, unspecified: Secondary | ICD-10-CM | POA: Diagnosis not present

## 2023-03-21 MED ORDER — AZITHROMYCIN 250 MG PO TABS
ORAL_TABLET | ORAL | 0 refills | Status: AC
  Filled 2023-03-21: qty 6, 5d supply, fill #0

## 2023-03-21 MED ORDER — FLUCONAZOLE 150 MG PO TABS
150.0000 mg | ORAL_TABLET | Freq: Once | ORAL | 0 refills | Status: AC
  Filled 2023-03-21: qty 1, 1d supply, fill #0

## 2023-03-21 MED ORDER — CEFTRIAXONE SODIUM 1 G IJ SOLR
1.0000 g | Freq: Once | INTRAMUSCULAR | Status: AC
  Administered 2023-03-21: 1 g via INTRAMUSCULAR

## 2023-03-21 NOTE — Progress Notes (Signed)
 Patient Care Team: Margaree Mackintosh, MD as PCP - General (Internal Medicine) Serena Croissant, MD as Consulting Physician (Hematology and Oncology) Antony Blackbird, MD as Consulting Physician (Radiation Oncology) Harriette Bouillon, MD as Consulting Physician (General Surgery)  Visit Date: 03/21/23  Subjective:   Chief Complaint  Patient presents with   Sinusitis    Hard to sleep, face pain, teeth pain going on for 3 weeks.    Headache  Patient ZO:XWRUEAV Saraiyah, Hemminger DOB:May 19, 1960,62 y.o. WUJ:[id number]   63 y.o. Female presents today for acute sick visit with Nasal Congestion w/ Green Mucus, Headache, and Sinus Pain. Patient has a past medical history of DMARD therapy. Says that for the past 3 weeks she hasn't been able to breathe well -which has been interfering with her CPAP- due to her nasal/sinus congestion, which is also causing a headache, facial pain, and toothaches. Also notes ear crackling.   Past Medical History:  Diagnosis Date   Anxiety    Allergy induced asthma   Arthritis    Asthma    Breast cancer (HCC)    Crohn disease (HCC)    Depression    GERD (gastroesophageal reflux disease)    History of methicillin resistant staphylococcus aureus (MRSA)    pt denies this.   History of radiation therapy    Left Breast- 05/09/22-06/11/22-Dr. Antony Blackbird   Multiple thyroid nodules    Pneumonia    Port-A-Cath in place 03/25/2022   Sleep apnea    CPAP   Thyroid cancer (HCC)     Allergies  Allergen Reactions   Gabapentin Itching   Hydrocodone Itching   Oxycodone Hcl Itching    NDC NFAO:[id number]  NDC GEXB:[id number]    Family History  Problem Relation Age of Onset   Alcohol abuse Mother    Alcohol abuse Sister    Angioedema Neg Hx    Allergic rhinitis Neg Hx    Asthma Neg Hx    Atopy Neg Hx    Eczema Neg Hx    Immunodeficiency Neg Hx    Urticaria Neg Hx    Colon cancer Neg Hx    Stomach cancer Neg Hx    Esophageal cancer Neg Hx    Colon polyps Neg Hx       Review of Systems  HENT:  Positive for congestion (nasal/sinus w/ green mucus), sinus pain (& facial/tooth pain) and sore throat.        (+) Ear popping  Neurological:  Positive for headaches.     Objective:  Vitals: BP 102/80   Temp 98.1 F (36.7 C)   Ht 5\' 2"  (1.575 m)   Wt 140 lb (63.5 kg)   LMP 01/10/2014   SpO2 99%   BMI 25.61 kg/m   Physical Exam Vitals and nursing note reviewed.  Constitutional:      General: She is not in acute distress.    Appearance: Normal appearance. She is not toxic-appearing.  HENT:     Head: Normocephalic and atraumatic.     Right Ear: Tympanic membrane is not erythematous.     Left Ear: Tympanic membrane is not erythematous.     Ears:     Comments: TMs full and dull, not red.    Mouth/Throat:     Mouth: Mucous membranes are moist.     Pharynx: Posterior oropharyngeal erythema present.  Neck:     Comments: Anterior cervical nodes bilaterally, no posterior nodes.  Pulmonary:     Effort: Pulmonary effort is normal.     Breath  sounds: Normal breath sounds and air entry. No transmitted upper airway sounds. No decreased breath sounds, wheezing, rhonchi or rales.  Lymphadenopathy:     Cervical:     Right cervical: No posterior cervical adenopathy.    Left cervical: No posterior cervical adenopathy.  Skin:    General: Skin is warm and dry.  Neurological:     Mental Status: She is alert and oriented to person, place, and time. Mental status is at baseline.  Psychiatric:        Mood and Affect: Mood normal.        Behavior: Behavior normal.        Thought Content: Thought content normal.        Judgment: Judgment normal.     Results:  Studies Obtained And Personally Reviewed By Me: Labs:     Component Value Date/Time   NA 139 09/23/2022 1207   K 4.4 09/23/2022 1207   CL 103 09/23/2022 1207   CO2 28 09/23/2022 1207   GLUCOSE 93 09/23/2022 1207   BUN 12 09/23/2022 1207   CREATININE 0.62 09/23/2022 1207   CALCIUM 9.8 09/23/2022  1207   PROT 7.0 09/23/2022 1207   ALBUMIN 4.2 04/15/2022 0827   AST 17 09/23/2022 1207   AST 19 04/15/2022 0827   ALT 13 09/23/2022 1207   ALT 21 04/15/2022 0827   ALKPHOS 53 04/15/2022 0827   BILITOT 0.5 09/23/2022 1207   BILITOT 0.4 04/15/2022 0827   GFRNONAA >60 04/15/2022 0827   GFRNONAA 96 03/02/2020 1100   GFRAA 112 03/02/2020 1100    Lab Results  Component Value Date   WBC 4.5 09/23/2022   HGB 12.0 09/23/2022   HCT 36.5 09/23/2022   MCV 91.0 09/23/2022   PLT 325 09/23/2022   Lab Results  Component Value Date   CHOL 250 (H) 09/23/2022   HDL 85 09/23/2022   LDLCALC 149 (H) 09/23/2022   TRIG 67 09/23/2022   CHOLHDL 2.9 09/23/2022   Lab Results  Component Value Date   TSH 0.82 01/30/2023   Assessment & Plan:  Acute Non-recurrent Maxillary Sinusitis: x3 weeks. Not concerned for any lower respiratory infection as chest was clear to auscultation. Given 1 g Rocephin IM today. Sending in 250 mg Azithromycin - take 2 tablets on Day 1 and 1 tablet on Days 2-5 and 150 mg Diflucan in case you develop Candida vaginitis. Contact us if symptoms worsen/persist despite treatment.      I,Emily Lagle,acting as a Neurosurgeon for Margaree Mackintosh, MD.,have documented all relevant documentation on the behalf of Margaree Mackintosh, MD,as directed by  Margaree Mackintosh, MD while in the presence of Margaree Mackintosh, MD.   I, Margaree Mackintosh, MD, have reviewed all documentation for this visit. The documentation on 03/23/23 for the exam, diagnosis, procedures, and orders are all accurate and complete.

## 2023-03-23 NOTE — Patient Instructions (Addendum)
 We are sorry you are not feeling well.  We are sending in Zithromax Z-PAK 2 tabs day 1 followed by 1 tab days 2 through 5 along with Diflucan tablet should you develop Candida vaginitis while on antibiotics.  You received Rocephin 1 g IM in the office today.  Please call us if not improving in a few days or sooner if worse.

## 2023-03-31 ENCOUNTER — Other Ambulatory Visit: Payer: Self-pay

## 2023-04-01 ENCOUNTER — Encounter: Payer: Self-pay | Admitting: Gastroenterology

## 2023-04-07 ENCOUNTER — Other Ambulatory Visit: Payer: Self-pay

## 2023-04-07 ENCOUNTER — Ambulatory Visit (INDEPENDENT_AMBULATORY_CARE_PROVIDER_SITE_OTHER): Payer: 59 | Admitting: Family Medicine

## 2023-04-07 VITALS — BP 122/70 | HR 86 | Ht 62.0 in

## 2023-04-07 DIAGNOSIS — M79641 Pain in right hand: Secondary | ICD-10-CM | POA: Diagnosis not present

## 2023-04-07 DIAGNOSIS — M1811 Unilateral primary osteoarthritis of first carpometacarpal joint, right hand: Secondary | ICD-10-CM | POA: Diagnosis not present

## 2023-04-07 DIAGNOSIS — M19011 Primary osteoarthritis, right shoulder: Secondary | ICD-10-CM

## 2023-04-07 NOTE — Progress Notes (Unsigned)
 [name] [address] [place] [address] Phone: [phone] Subjective:   INadine Counts, am serving as a scribe for Dr. Antoine Primas.  I'm seeing this patient by the request  of:  [name], [name], MD  CC: Hand pain, shoulder pain  BJY:[id number]  02/06/2023 Will monitor closely.  Did discuss though that may need to consider after bracing and exercises injections if it does not seem to improve.     Will try bracing, topical anti-inflammatories and icing regimen.  Worsening pain consider injection.     Patient given injection and tolerated procedure well, discussed icing regimen and home exercises, discussed which activities to do and which ones to avoid.  Increase activity slowly.  Follow-up again in 6 to 8 weeks     Updated 04/07/2023 JUDETH [name] is a 63 y.o. female coming in with complaint of finger and wrist pain. Still aches in the thenar eminence.  Trigger finger doing well. Right shoulder is hurting. Raising arm with weight causes pain. Pain has been about the same over the last couple of months.      Past Medical History:  Diagnosis Date   Anxiety    Allergy induced asthma   Arthritis    Asthma    Breast cancer (HCC)    Crohn disease (HCC)    Depression    GERD (gastroesophageal reflux disease)    History of methicillin resistant staphylococcus aureus (MRSA)    pt denies this.   History of radiation therapy    Left Breast- 05/09/22-06/11/22-Dr. Antony Blackbird   Multiple thyroid nodules    Pneumonia    Port-A-Cath in place 03/25/2022   Sleep apnea    CPAP   Thyroid cancer [facility])    Past Surgical History:  Procedure Laterality Date   ANAL FISSURE REPAIR  2004   BREAST LUMPECTOMY WITH RADIOACTIVE SEED LOCALIZATION Left 01/01/2022   Procedure: LEFT BREAST SEED LUMPECTOMY;  Surgeon: Harriette Bouillon, MD;  Location: [facility] OR;  Service: General;  Laterality: Left;   CESAREAN SECTION     COLONOSCOPY     INSERTION OF MESH N/A  07/26/2020   Procedure: INSERTION OF MESH;  Surgeon: Axel Filler, MD;  Location: [facility] OR;  Service: General;  Laterality: N/A;   LIGAMENT REPAIR Right 11/23/2015   Procedure: right index radial collateral LIGAMENT REPAIR;  Surgeon: Betha Loa, MD;  Location: [facility];  Service: Orthopedics;  Laterality: Right;   PORTACATH PLACEMENT Right 02/07/2022   Procedure: INSERTION PORT-A-CATH;  Surgeon: Harriette Bouillon, MD;  Location: [facility] OR;  Service: General;  Laterality: Right;   RE-EXCISION OF BREAST LUMPECTOMY Left 01/22/2022   Procedure: RE-EXCISION OF LEFT BREAST LUMPECTOMY;  Surgeon: Harriette Bouillon, MD;  Location: [facility];  Service: General;  Laterality: Left;   SENTINEL NODE BIOPSY Left 01/22/2022   Procedure: LEFT SENTINEL NODE BIOPSY;  Surgeon: Harriette Bouillon, MD;  Location: [facility];  Service: General;  Laterality: Left;   THYROIDECTOMY N/A 01/01/2022   Procedure: TOTAL THYROIDECTOMY WITH LIMITED LYMPH NODE DISSECTION;  Surgeon: Darnell Level, MD;  Location: [facility];  Service: General;  Laterality: N/A;   UPPER ENDOSCOPY W/ ESOPHAGEAL MANOMETRY  10/2021   VENTRAL HERNIA REPAIR N/A 07/26/2020   Procedure: LAPAROSCOPIC VENTRAL HERNIA REPAIR WITH MESH;  Surgeon: Axel Filler, MD;  Location: [facility] OR;  Service: General;  Laterality: N/A;   Social History   Socioeconomic History   Marital status: Married    Spouse name: Not  on file   Number of children: 2   Years of education: Not on file   Highest education level: Not on file  Occupational History   Occupation: Self employed  Tobacco Use   Smoking status: Never    Passive exposure: Never   Smokeless tobacco: Never  Vaping Use   Vaping status: Never Used  Substance and Sexual Activity   Alcohol use: Yes    Alcohol/week: 10.0 standard drinks of alcohol    Types: 10 Glasses of wine per week    Comment: 10 glasses/week   Drug use: No   Sexual activity: Yes    Birth control/protection:  Post-menopausal  Other Topics Concern   Not on file  Social History Narrative   Not on file   Social Drivers of Health   Financial Resource Strain: Low Risk  (09/17/2022)   Overall Financial Resource Strain (CARDIA)    Difficulty of Paying Living Expenses: Not hard at all  Food Insecurity: No Food Insecurity (09/17/2022)   Hunger Vital Sign    Worried About Running Out of Food in the Last Year: Never true    Ran Out of Food in the Last Year: Never true  Transportation Needs: No Transportation Needs (09/17/2022)   PRAPARE - Administrator, Civil Service (Medical): No    Lack of Transportation (Non-Medical): No  Physical Activity: Sufficiently Active (09/17/2022)   Exercise Vital Sign    Days of Exercise per Week: 5 days    Minutes of Exercise per Session: 60 min  Stress: Stress Concern Present (09/17/2022)   [employer] of Occupational Health - Occupational Stress Questionnaire    Feeling of Stress : To some extent  Social Connections: Socially Integrated (09/17/2022)   Social Connection and Isolation Panel [NHANES]    Frequency of Communication with Friends and Family: Three times a week    Frequency of Social Gatherings with Friends and Family: Once a week    Attends Religious Services: More than 4 times per year    Active Member of [employer] or Organizations: Yes    Attends Engineer, structural: More than 4 times per year    Marital Status: Married   Allergies  Allergen Reactions   Gabapentin Itching   Hydrocodone Itching   Oxycodone Hcl Itching    NDC ONGE:95284132440  NDC NUUV:[id number]   Family History  Problem Relation Age of Onset   Alcohol abuse Mother    Alcohol abuse Sister    Angioedema Neg Hx    Allergic rhinitis Neg Hx    Asthma Neg Hx    Atopy Neg Hx    Eczema Neg Hx    Immunodeficiency Neg Hx    Urticaria Neg Hx    Colon cancer Neg Hx    Stomach cancer Neg Hx    Esophageal cancer Neg Hx    Colon polyps Neg Hx     Current  Outpatient Medications (Endocrine & Metabolic):    levothyroxine (SYNTHROID) 50 MCG tablet, Take 2 tablets (100 mcg total) by mouth daily before breakfast.  Current Outpatient Medications (Cardiovascular):    ezetimibe (ZETIA) 10 MG tablet, Take 1 tablet (10 mg total) by mouth daily.  Current Outpatient Medications (Respiratory):    albuterol (VENTOLIN HFA) 108 (90 Base) MCG/ACT inhaler, Inhale 2 puffs into the lungs every 6 (six) hours as needed for wheezing or shortness of breath.   cetirizine (ZYRTEC) 10 MG tablet, Take 10 mg by mouth in the morning.  Current Outpatient Medications (Analgesics):  adalimumab (HUMIRA, 2 SYRINGE,) 40 MG/0.4ML prefilled syringe, Inject 0.4 mLs (40 mg total) under the skin every 14 (fourteen) days.   celecoxib (CELEBREX) 100 MG capsule, Take 1 capsule (100 mg total) by mouth in the morning and at bedtime.   Current Outpatient Medications (Other):    anastrozole (ARIMIDEX) 1 MG tablet, Take 1 tablet (1 mg total) by mouth daily.   ascorbic acid (VITAMIN C) 500 MG tablet, Take 500 mg by mouth 2 (two) times a week.   bimatoprost (LUMIGAN) 0.01 % SOLN, Instill 1 drop into both eyes at bedtime   busPIRone (BUSPAR) 7.5 MG tablet, Take 1 tablet (7.5 mg total) by mouth 2 (two) times daily.   DULoxetine (CYMBALTA) 60 MG capsule, Take 1 capsule (60 mg total) by mouth daily.   fluocinonide ointment (LIDEX) 0.05 %, Apply 1 application  topically daily as needed (irritation).   Magnesium 250 MG TABS, Take by mouth.   omeprazole (PRILOSEC) 20 MG capsule, Take 1 capsule (20 mg total) by mouth daily.   Reviewed prior external information including notes and imaging from  primary care provider As well as notes that were available from care everywhere and other healthcare systems.  Past medical history, social, surgical and family history all reviewed in electronic medical record.  No pertanent information unless stated regarding to the chief complaint.   Review of  Systems:  No headache, visual changes, nausea, vomiting, diarrhea, constipation, dizziness, abdominal pain, skin rash, fevers, chills, night sweats, weight loss, swollen lymph nodes, body aches, joint swelling, chest pain, shortness of breath, mood changes. POSITIVE muscle aches  Objective  Blood pressure 122/70, pulse 86, height 5\' 2"  (1.575 m), last menstrual period 01/10/2014, SpO2 97%.   General: No apparent distress alert and oriented x3 mood and affect normal, dressed appropriately.  HEENT: Pupils equal, extraocular movements intact  Respiratory: Patient's speak in full sentences and does not appear short of breath  Cardiovascular: No lower extremity edema, non tender, no erythema  Right on exam shows some impingement noted.  Severe swelling noted of the [facility] joint.  Positive grind test noted.  Limited muscular skeletal ultrasound was performed and interpreted by Antoine Primas, M  [facility] joint does have significant hypoechoic changes with increasing in Doppler flow noted.  No acute injury it appears for potentially acute swelling.  Regarding patient's right shoulder does show some narrowing of the acromioclavicular joint with some hypoechoic changes noted as well. 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

## 2023-04-07 NOTE — Patient Instructions (Signed)
Do prescribed exercises at least 3x a week  See you again in 2 months

## 2023-04-08 ENCOUNTER — Encounter: Payer: Self-pay | Admitting: Family Medicine

## 2023-04-08 DIAGNOSIS — M19019 Primary osteoarthritis, unspecified shoulder: Secondary | ICD-10-CM | POA: Insufficient documentation

## 2023-04-08 NOTE — Assessment & Plan Note (Signed)
 Significant arthritic changes noted, discussed custom bracing which patient decided to do with the Exos brace.  Patient wanted to hold on any injections at this time.  Would not want any surgical intervention and is going through plenty over the course of the last several years with interventions at the moment.  We will monitor otherwise.  Discussed icing regimen of home exercises.

## 2023-04-08 NOTE — Assessment & Plan Note (Signed)
 Arthritis noted.  Discussed icing regimen and home exercises.  Declined any type of injection.  Discussed keeping hands within peripheral vision and given some exercises.  Follow-up again in 2 months

## 2023-04-11 ENCOUNTER — Other Ambulatory Visit (HOSPITAL_COMMUNITY): Payer: Self-pay

## 2023-04-11 MED ORDER — ZOSTER VAC RECOMB ADJUVANTED 50 MCG/0.5ML IM SUSR
0.5000 mL | Freq: Once | INTRAMUSCULAR | 0 refills | Status: AC
Start: 2023-04-11 — End: 2023-04-12
  Filled 2023-04-11: qty 0.5, 1d supply, fill #0

## 2023-04-14 ENCOUNTER — Other Ambulatory Visit: Payer: Self-pay | Admitting: Internal Medicine

## 2023-04-14 ENCOUNTER — Other Ambulatory Visit: Payer: Self-pay

## 2023-04-14 ENCOUNTER — Other Ambulatory Visit (HOSPITAL_COMMUNITY): Payer: Self-pay

## 2023-04-14 MED ORDER — LEVOTHYROXINE SODIUM 50 MCG PO TABS
100.0000 ug | ORAL_TABLET | Freq: Every day | ORAL | 3 refills | Status: DC
  Filled 2023-04-14 (×2): qty 90, 45d supply, fill #0
  Filled 2023-07-15: qty 180, 90d supply, fill #1
  Filled 2023-10-08: qty 180, 90d supply, fill #2
  Filled 2024-01-06 (×2): qty 180, 90d supply, fill #3

## 2023-04-15 DIAGNOSIS — M1811 Unilateral primary osteoarthritis of first carpometacarpal joint, right hand: Secondary | ICD-10-CM | POA: Diagnosis not present

## 2023-04-22 ENCOUNTER — Other Ambulatory Visit: Payer: Self-pay

## 2023-04-24 DIAGNOSIS — G4733 Obstructive sleep apnea (adult) (pediatric): Secondary | ICD-10-CM | POA: Diagnosis not present

## 2023-04-25 ENCOUNTER — Other Ambulatory Visit: Payer: Self-pay

## 2023-04-25 NOTE — Progress Notes (Signed)
 Specialty Pharmacy Refill Coordination Note  [name] is a 63 y.o. female contacted today regarding refills of specialty medication(s) Adalimumab (Humira (2 Syringe))   Patient requested Delivery   Delivery date: 05/07/23   Verified address: [address]   [place]   Medication will be filled on 05/06/23.

## 2023-05-01 ENCOUNTER — Other Ambulatory Visit: Payer: Self-pay

## 2023-05-07 DIAGNOSIS — G4733 Obstructive sleep apnea (adult) (pediatric): Secondary | ICD-10-CM | POA: Diagnosis not present

## 2023-05-12 ENCOUNTER — Ambulatory Visit: Payer: 59 | Attending: Hematology and Oncology

## 2023-05-12 DIAGNOSIS — Z483 Aftercare following surgery for neoplasm: Secondary | ICD-10-CM | POA: Insufficient documentation

## 2023-05-21 DIAGNOSIS — G4733 Obstructive sleep apnea (adult) (pediatric): Secondary | ICD-10-CM | POA: Diagnosis not present

## 2023-05-22 ENCOUNTER — Telehealth: Payer: Self-pay | Admitting: Internal Medicine

## 2023-05-22 NOTE — Telephone Encounter (Signed)
 Patient called and said she's traveling to [place] in a few weeks on 06/04/23 and wanted to know if she needs any updates immunizations before she goes. Please advise.

## 2023-05-27 ENCOUNTER — Other Ambulatory Visit: Payer: Self-pay

## 2023-05-27 ENCOUNTER — Other Ambulatory Visit: Payer: Self-pay | Admitting: Pharmacy Technician

## 2023-05-27 NOTE — Progress Notes (Signed)
 Specialty Pharmacy Refill Coordination Note  [name] is a 63 y.o. female contacted today regarding refills of specialty medication(s) Adalimumab  (Humira  (2 Syringe))   Patient requested Delivery   Delivery date: 06/16/23 (We don't ship on weekends.)   Verified address: [address] [place], [place] [address]   Medication will be filled on 06/16/23.

## 2023-05-29 ENCOUNTER — Other Ambulatory Visit (HOSPITAL_COMMUNITY): Payer: Self-pay

## 2023-05-29 ENCOUNTER — Other Ambulatory Visit: Payer: Self-pay

## 2023-05-29 ENCOUNTER — Other Ambulatory Visit: Payer: Self-pay | Admitting: Hematology and Oncology

## 2023-05-29 MED ORDER — ANASTROZOLE 1 MG PO TABS
1.0000 mg | ORAL_TABLET | Freq: Every day | ORAL | 3 refills | Status: AC
  Filled 2023-05-29: qty 90, 90d supply, fill #0
  Filled 2023-09-10: qty 90, 90d supply, fill #1
  Filled 2023-12-08: qty 90, 90d supply, fill #2

## 2023-05-29 NOTE — Progress Notes (Signed)
 Clinical Intervention Note  Clinical Intervention Notes: Patient reports starting D3 5000 IU, B12 1000 mcg, Green Tea CR Phytosome 2 Cap, Black Cohosh 540 mg, and Estroven 1 cap, no DDIs were identified with her Humira  therapy.   Clinical Intervention Outcomes: Prevention of an adverse drug event   Celinda Collar

## 2023-06-03 ENCOUNTER — Other Ambulatory Visit (HOSPITAL_COMMUNITY): Payer: Self-pay

## 2023-06-05 ENCOUNTER — Telehealth: Payer: Self-pay | Admitting: *Deleted

## 2023-06-05 NOTE — Telephone Encounter (Signed)
 Per MD request RN placed call to pt with recent Guardant Reveal results being negative.  Pt educated and verbalized understanding.

## 2023-06-10 ENCOUNTER — Encounter: Payer: Self-pay | Admitting: Hematology and Oncology

## 2023-06-16 ENCOUNTER — Other Ambulatory Visit: Payer: Self-pay

## 2023-06-16 ENCOUNTER — Ambulatory Visit (INDEPENDENT_AMBULATORY_CARE_PROVIDER_SITE_OTHER): Admitting: Family Medicine

## 2023-06-16 ENCOUNTER — Encounter: Payer: Self-pay | Admitting: Family Medicine

## 2023-06-16 ENCOUNTER — Other Ambulatory Visit (HOSPITAL_COMMUNITY): Payer: Self-pay

## 2023-06-16 VITALS — BP 120/70 | HR 89 | Ht 62.0 in

## 2023-06-16 DIAGNOSIS — M79641 Pain in right hand: Secondary | ICD-10-CM

## 2023-06-16 DIAGNOSIS — M67441 Ganglion, right hand: Secondary | ICD-10-CM

## 2023-06-16 NOTE — Progress Notes (Signed)
 [facility] [address] [place] [address] Phone: [phone] Subjective:   [name] am a scribe for Dr. Felipe Horton.   I'm seeing this patient by the request  of:  [name], [name], MD  CC: Back and neck pain follow-up  BJY:[id number]  04/07/2023 Arthritis noted.  Discussed icing regimen and home exercises.  Declined any type of injection.  Discussed keeping hands within peripheral vision and given some exercises.  Follow-up again in 2 months     Significant arthritic changes noted, discussed custom bracing which patient decided to do with the Exos brace.  Patient wanted to hold on any injections at this time.  Would not want any surgical intervention and is going through plenty over the course of the last several years with interventions at the moment.  We will monitor otherwise.  Discussed icing regimen of home exercises.     Updated 06/16/2023 [name] is a 63 y.o. female coming in with complaint of shoulder and hand pain. Patient states she feels pretty good. Hand pain is consistent but still hurts. It is a little better. Injection in cyst last time. Patient thinks that it might be back because it feels like something is in there.       Past Medical History:  Diagnosis Date   Anxiety    Allergy  induced asthma   Arthritis    Asthma    Breast cancer (HCC)    Crohn disease (HCC)    Depression    GERD (gastroesophageal reflux disease)    History of methicillin resistant staphylococcus aureus (MRSA)    pt denies this.   History of radiation therapy    Left Breast- 05/09/22-06/11/22-Dr. Retta Caster   Multiple thyroid  nodules    Pneumonia    Port-A-Cath in place 03/25/2022   Sleep apnea    CPAP   Thyroid  cancer [facility])    Past Surgical History:  Procedure Laterality Date   ANAL FISSURE REPAIR  2004   BREAST LUMPECTOMY WITH RADIOACTIVE SEED LOCALIZATION Left 01/01/2022   Procedure: LEFT BREAST SEED LUMPECTOMY;  Surgeon:  Sim Dryer, MD;  Location: [facility] OR;  Service: General;  Laterality: Left;   CESAREAN SECTION     COLONOSCOPY     INSERTION OF MESH N/A 07/26/2020   Procedure: INSERTION OF MESH;  Surgeon: Shela Derby, MD;  Location: [facility] OR;  Service: General;  Laterality: N/A;   LIGAMENT REPAIR Right 11/23/2015   Procedure: right index radial collateral LIGAMENT REPAIR;  Surgeon: Brunilda Capra, MD;  Location: [facility];  Service: Orthopedics;  Laterality: Right;   PORTACATH PLACEMENT Right 02/07/2022   Procedure: INSERTION PORT-A-CATH;  Surgeon: Sim Dryer, MD;  Location: [facility] OR;  Service: General;  Laterality: Right;   RE-EXCISION OF BREAST LUMPECTOMY Left 01/22/2022   Procedure: RE-EXCISION OF LEFT BREAST LUMPECTOMY;  Surgeon: Sim Dryer, MD;  Location: [facility];  Service: General;  Laterality: Left;   SENTINEL NODE BIOPSY Left 01/22/2022   Procedure: LEFT SENTINEL NODE BIOPSY;  Surgeon: Sim Dryer, MD;  Location: [facility];  Service: General;  Laterality: Left;   THYROIDECTOMY N/A 01/01/2022   Procedure: TOTAL THYROIDECTOMY WITH LIMITED LYMPH NODE DISSECTION;  Surgeon: Oralee Billow, MD;  Location: [facility];  Service: General;  Laterality: N/A;   UPPER ENDOSCOPY W/ ESOPHAGEAL MANOMETRY  10/2021   VENTRAL HERNIA REPAIR N/A 07/26/2020   Procedure: LAPAROSCOPIC VENTRAL HERNIA REPAIR WITH MESH;  Surgeon: Shela Derby, MD;  Location: [facility] OR;  Service: General;  Laterality: N/A;   Social History   Socioeconomic History   Marital status: Married    Spouse name: Not on file   Number of children: 2   Years of education: Not on file   Highest education level: Not on file  Occupational History   Occupation: Self employed  Tobacco Use   Smoking status: Never    Passive exposure: Never   Smokeless tobacco: Never  Vaping Use   Vaping status: Never Used  Substance and Sexual Activity   Alcohol use: Yes    Alcohol/week: 10.0 standard drinks of  alcohol    Types: 10 Glasses of wine per week    Comment: 10 glasses/week   Drug use: No   Sexual activity: Yes    Birth control/protection: Post-menopausal  Other Topics Concern   Not on file  Social History Narrative   Not on file   Social Drivers of Health   Financial Resource Strain: Low Risk  (09/17/2022)   Overall Financial Resource Strain (CARDIA)    Difficulty of Paying Living Expenses: Not hard at all  Food Insecurity: No Food Insecurity (09/17/2022)   Hunger Vital Sign    Worried About Running Out of Food in the Last Year: Never true    Ran Out of Food in the Last Year: Never true  Transportation Needs: No Transportation Needs (09/17/2022)   PRAPARE - Administrator, Civil Service (Medical): No    Lack of Transportation (Non-Medical): No  Physical Activity: Sufficiently Active (09/17/2022)   Exercise Vital Sign    Days of Exercise per Week: 5 days    Minutes of Exercise per Session: 60 min  Stress: Stress Concern Present (09/17/2022)   [employer] of Occupational Health - Occupational Stress Questionnaire    Feeling of Stress : To some extent  Social Connections: Socially Integrated (09/17/2022)   Social Connection and Isolation Panel [NHANES]    Frequency of Communication with Friends and Family: Three times a week    Frequency of Social Gatherings with Friends and Family: Once a week    Attends Religious Services: More than 4 times per year    Active Member of [employer] or Organizations: Yes    Attends Engineer, structural: More than 4 times per year    Marital Status: Married   Allergies  Allergen Reactions   Gabapentin Itching   Hydrocodone  Itching   Oxycodone  Hcl Itching    NDC ZOXW:[id number]  NDC BJYN:[id number]   Family History  Problem Relation Age of Onset   Alcohol abuse Mother    Alcohol abuse Sister    Angioedema Neg Hx    Allergic rhinitis Neg Hx    Asthma Neg Hx    Atopy Neg Hx    Eczema Neg Hx    Immunodeficiency Neg Hx     Urticaria Neg Hx    Colon cancer Neg Hx    Stomach cancer Neg Hx    Esophageal cancer Neg Hx    Colon polyps Neg Hx     Current Outpatient Medications (Endocrine & Metabolic):    levothyroxine  (SYNTHROID ) 50 MCG tablet, Take 2 tablets (100 mcg total) by mouth daily before breakfast.  Current Outpatient Medications (Cardiovascular):    ezetimibe  (ZETIA ) 10 MG tablet, Take 1 tablet (10 mg total) by mouth daily.  Current Outpatient Medications (Respiratory):    albuterol  (VENTOLIN  HFA) 108 (90 Base) MCG/ACT inhaler, Inhale 2 puffs into the lungs every 6 (six) hours as needed for wheezing or  shortness of breath.   cetirizine (ZYRTEC) 10 MG tablet, Take 10 mg by mouth in the morning.  Current Outpatient Medications (Analgesics):    adalimumab  (HUMIRA , 2 SYRINGE,) 40 MG/0.4ML prefilled syringe, Inject 0.4 mLs (40 mg total) under the skin every 14 (fourteen) days.   celecoxib  (CELEBREX ) 100 MG capsule, Take 1 capsule (100 mg total) by mouth in the morning and at bedtime.   Current Outpatient Medications (Other):    anastrozole  (ARIMIDEX ) 1 MG tablet, Take 1 tablet (1 mg total) by mouth daily.   ascorbic acid (VITAMIN C) 500 MG tablet, Take 500 mg by mouth 2 (two) times a week.   bimatoprost  (LUMIGAN ) 0.01 % SOLN, Instill 1 drop into both eyes at bedtime   busPIRone  (BUSPAR ) 7.5 MG tablet, Take 1 tablet (7.5 mg total) by mouth 2 (two) times daily.   DULoxetine  (CYMBALTA ) 60 MG capsule, Take 1 capsule (60 mg total) by mouth daily.   fluocinonide ointment (LIDEX) 0.05 %, Apply 1 application  topically daily as needed (irritation).   Magnesium 250 MG TABS, Take by mouth.   omeprazole  (PRILOSEC) 20 MG capsule, Take 1 capsule (20 mg total) by mouth daily.   Reviewed prior external information including notes and imaging from  primary care provider As well as notes that were available from care everywhere and other healthcare systems.  Past medical history, social, surgical and family  history all reviewed in electronic medical record.  No pertanent information unless stated regarding to the chief complaint.   Review of Systems:  No headache, visual changes, nausea, vomiting, diarrhea, constipation, dizziness, abdominal pain, skin rash, fevers, chills, night sweats, weight loss, swollen lymph nodes, body aches, joint swelling, chest pain, shortness of breath, mood changes. POSITIVE muscle aches  Objective  Blood pressure 120/70, pulse 89, height 5\' 2"  (1.575 m), last menstrual period 01/10/2014, SpO2 95%.   General: No apparent distress alert and oriented x3 mood and affect normal, dressed appropriately.  HEENT: Pupils equal, extraocular movements intact  Respiratory: Patient's speak in full sentences and does not appear short of breath  Cardiovascular: No lower extremity edema, non tender, no erythema  Back and neck does have some loss lordosis noted.  Some tenderness to palpation noted.  Some mild loss of lordosis.  Limited muscular skeletal ultrasound was performed and interpreted by Ronnell Coins, M  Limited ultrasound of the patient's hand shows on the expected finger still having the cyst formation noted.  Does seem to be smaller than what it was previously and not pushing on as much of the soft tissue area.    Impression and Recommendations:    The above documentation has been reviewed and is accurate and complete Teegan Guinther M Braydee Shimkus, DO

## 2023-06-16 NOTE — Assessment & Plan Note (Signed)
 Discussed with patient at great length, discussed icing regimen of home exercises, discussed which activities to do and which ones to avoid.  Increase activity slowly.  Follow-up again in 6 to 8 weeks otherwise.

## 2023-06-16 NOTE — Patient Instructions (Addendum)
 Good to see you! Cyst is back but we will watch it.  Check back in 7 to 8 weeks.

## 2023-06-19 DIAGNOSIS — G4733 Obstructive sleep apnea (adult) (pediatric): Secondary | ICD-10-CM | POA: Diagnosis not present

## 2023-06-24 ENCOUNTER — Other Ambulatory Visit: Payer: Self-pay

## 2023-06-26 ENCOUNTER — Other Ambulatory Visit: Payer: Self-pay

## 2023-06-26 NOTE — Progress Notes (Signed)
 Specialty Pharmacy Ongoing Clinical Assessment Note  [name] is a 63 y.o. female who is being followed by the specialty pharmacy service for RxSp Crohn's Disease   Patient's specialty medication(s) reviewed today: Adalimumab  (Humira  (2 Syringe))   Missed doses in the last 4 weeks: 0   Patient/Caregiver did not have any additional questions or concerns.   Therapeutic benefit summary: Patient is achieving benefit   Adverse events/side effects summary: No adverse events/side effects   Patient's therapy is appropriate to: Continue    Goals Addressed             This Visit's Progress    Maintain optimal adherence to therapy   On track    Patient is on track. Patient will maintain adherence         Follow up: 12 months  [facility]

## 2023-07-08 ENCOUNTER — Encounter (INDEPENDENT_AMBULATORY_CARE_PROVIDER_SITE_OTHER): Payer: Self-pay

## 2023-07-08 ENCOUNTER — Other Ambulatory Visit: Payer: Self-pay

## 2023-07-09 ENCOUNTER — Other Ambulatory Visit: Payer: Self-pay

## 2023-07-09 NOTE — Progress Notes (Signed)
 Specialty Pharmacy Refill Coordination Note  [name] is a 63 y.o. female contacted today regarding refills of specialty medication(s) Adalimumab  (Humira  (2 Syringe))   Patient requested [name] at [facility] at [place] date: 07/11/23   Medication will be filled on 07/10/23.

## 2023-07-10 ENCOUNTER — Other Ambulatory Visit: Payer: Self-pay

## 2023-07-11 ENCOUNTER — Other Ambulatory Visit (HOSPITAL_COMMUNITY): Payer: Self-pay

## 2023-07-15 ENCOUNTER — Other Ambulatory Visit (HOSPITAL_COMMUNITY): Payer: Self-pay

## 2023-07-28 ENCOUNTER — Other Ambulatory Visit (HOSPITAL_COMMUNITY): Payer: Self-pay

## 2023-07-28 NOTE — Progress Notes (Unsigned)
 HPI :  63 year old female with a history of colonic Crohn's disease, GERD, EoE, breast cancer, here for a follow up visit.    Crohn's history:  She has been followed by [facility] for her Crohn's disease for the past several years. Dx with Crohn's disease in 2001.  Index symptoms appear to be abdominal pain, loose stools with blood and mucus.  Over the years she has been on a variety of regimens to include Remicade, thiopurine's, Humira , methotrexate, mesalamine, Flagyl.  She has colonic Crohn's involvement with reported history of perianal abscess without fistula in the past.  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   She has been on and off thiopurine's methotrexate in years past. Currently on Humira  every 2 weeks and has been on this for the past few years and controlling her disease well.  Her vaccines for flu, COVID, pneumococcus, Shingrix  are all up-to-date. Last colonoscopy 12/2022.   SINCE LAST VISIT   63 year old female here for follow-up visit.  I last saw her in December for colonoscopy.  There was no active Crohn's disease.  Biopsies taken of her colon showed remission.  She did have some polyps in her colon that were removed.  She has remained on Humira  every 2 weeks and denies any problems with the regimen.  She appears to be tolerating it well.  No diarrhea which is what she typically will have if her Crohn's is active.  If anything she is been having some constipation at times.  Her vaccinations are up-to-date.   Recall she has a history of suspected EOE and GERD.  Previously had an EGD in October 2023. She had mild LA grade a esophagitis with a GEJ stenosis that was treated with balloon dilation.  Also had some subtle changes in her esophagus concerning for EOE.  As below, pathology was consistent with eosinophilic esophagitis.  She was started on omeprazole  40 mg daily and referred to an allergist.  She had extensive food allergy  testing which was negative.  She states post  dilation and on omeprazole  her symptoms resolved.     Over time we tapered down the omeprazole  to 20 mg once daily.  She had been doing well on that but has had increased reflux symptoms that have since recurred and bothersome.  She can wake up at night with reflux occasionally.  She also states her dysphagia has recurred to solids, bulkier foods such as chicken, bread etc.  This been going on for couple months, happening a few days per week.  She is compliant with omeprazole  20 mg daily but typically eats it after meal.  She did have a DEXA last year which was normal, no osteoporosis or osteopenia.    Endoscopy history:  Colonoscopy 12/29/19: Skin tags were found on perianal exam. The terminal ileum appeared normal. Normal mucosa was found in the entire colon. Biopsies for histology were  taken with a cold forceps from the right colon and left colon for  evaluation of microscopic colitis. Many small and large-mouthed diverticula were found in the entire colon. Scattered probable pseudopolyps were found in the transverse colon,  ascending colon and cecum. The polyps were 1 to 2 mm in size. These  polyps were removed with a jumbo cold forceps. Resection and retrieval  were complete. Internal hemorrhoids were found during retroflexion.  A: Right colon, biopsy - Colonic mucosa with focal hyperplastic change - No significant active inflammation - No evidence of dysplasia   B: Colon, random, biopsy - Melanosis  coli - No significant active inflammation - No evidence of dysplasia    Colon 11/07/17: One 12 mm polyp in the sigmoid colon, removed with a hot snare. Resected and retrieved. Pseudopolyps at the hepatic flexure. Biopsied. Erythematous mucosa in the sigmoid colon c/w scar. Biopsied. No active disease seen. Moderate diverticulosis in the sigmoid colon, in the descending colon, in the transverse colon and in the ascending colon. There was no evidence of diverticular bleeding.  A: Colon,  transverse, biopsy - Sessile serrated polyp (2 fragments); negative for dysplasia - Lymphoid nodule (1 fragment) - No CMV viral cytopathic effect or granuloma identified   B: Colon, sigmoid, biopsy - Unremarkable colonic mucosa (2 fragments) - No CMV viral cytopathic effect, granuloma, or dysplasia identified  EGD 07/12/15: Normal esophagus, stomach, and duodenum.   Colon 07/12/15: Pseudopolyps in the transverse colon. Resected and retrieved. Diverticulosis in the sigmoid colon, in the descending colon, in the transverse colon and in the ascending colon. Increased mucosa vascular pattern in the rectum. Biopsied.  A: Colon, ascending, transverse, biopsy - Colonic mucosa with mild hyperplastic epithelial changes, negative for dysplasia - No active inflammation, CMV cytopathic effect or granulomas identified   B: Colon, random, left, biopsy - Colonic mucosa with no specific pathologic abnormality, negative for dysplasia - No significant crypt architectural distortion or active inflammation seen - No CMV cytopathic effect or granulomas identified  Colon 12/17/10:  One 12 mm polyp in the rectum. Resected and retrieved. Erythematous mucosa in the sigmoid colon and in the descending colon. The splenic flexure, transverse colon, hepatic flexure, ascending colon, cecum and appendiceal orifice are normal with the exception of a few scattered diverticulae. Two biopsies were taken every 10 cm from the right colon and left colon.   A:  Colon, sigmoid and rectum, biopsy  -  Inflammatory pseudopolyp arising in chronic inflammatory bowel disease with  focal low grade dysplasia (see comment)   B:  Colon, right, biopsy  -  No significant pathologic abnormality  -  No inflammatory bowel disease or dysplasia identified   C:  Colon, left, biopsy  -  No significant pathologic abnormality  -  No inflammatory bowel disease or dysplasia identified        PET scan 10/29/21: IMPRESSION: 1. Minimally  metabolic 17 mm mixed density right lower lobe pulmonary nodule may reflect an infectious or inflammatory etiology, but given its morphology a very low-grade primary bronchogenic adenocarcinoma remains a pertinent differential consideration. Consider referral to multidisciplinary tumor board and dedicated chest CT surveillance imaging or versus direct tissue sampling. 2. Hypermetabolic heterogeneous 19 mm right thyroid  nodule, suggest further evaluation with thyroid  ultrasound and FNA. 3. Hypermetabolic mild symmetric distal esophageal wall thickening with a max SUV of 4.3, possibly reflecting esophagitis. Consider further evaluation with endoscopy. 4. Hypermetabolic hyperplasia of the tonsils slightly asymmetric to the left, nonspecific but commonly reactive. Consider correlation with direct visualization. 5. Focus of hypermetabolic activity about the left acromioclavicular joint without underlying osseous lesion identified, favored inflammatory. 6. Minimally metabolic right posterior gluteal soft tissue nodularity likely reflects sequela of subcutaneous injections or prior trauma. 7. Colonic diverticulosis without findings of acute diverticulitis. 8. Nodular uterine contour likely reflects leiomyomas.       Fecal calprotectin 11/09/21:    Quantiferon gold 11/02/21 - negative     EGD 11/09/21:  - Esophagogastric landmarks identified. - LA Grade A reflux esophagitis. - Benign-appearing esophageal stenosis. Dilated to 16mm with good result. - Esophageal mucosal changes suspicious for possible eosinophilic esophagitis vs.  reflux changes. - Normal stomach. - Normal examined duodenum.   Surgical [P], esophageal biopsies MORPHOLOGIC FEATURES CONSISTENT WITH EOSINOPHILIC ESOPHAGITIS. AVERAGE NUMBER OF EOSINOPHILS PER HIGH-POWERED FIELD: MORE THAN 20. NEGATIVE FOR DYSPLASIA AND MALIGNANCY. COMMENT: THE DIFFERENTIAL DIAGNOSIS INCLUDES PPI INDUCED EOSINOPHILIA.   Seen by allergy  -  negative food testing     Colonoscopy 12/25/22: - The examined portion of the ileum was normal. - Diverticulosis in the left colon and in the right colon. - One 10 mm polyp in the transverse colon, removed with a cold snare. Resected and retrieved. - Two diminutive polyps in the transverse colon, removed with a cold biopsy forceps. Resected and retrieved. - Three 4 to 8 mm polyps in the sigmoid colon, removed with a cold snare. Resected and retrieved. - Internal hemorrhoids. - The examination was otherwise normal. No active inflammation. - Biopsies were taken with a cold forceps for histology.  FINAL DIAGNOSIS       1. Surgical [P], colon, sigmoid, polyp (3) :      COMPATIBLE WITH SESSILE SERRATED POLYP WITHOUT CYTOLOGIC DYSPLASIA, 3 FRAGMENTS      TUBULAR ADENOMA      NEGATIVE FOR HIGH-GRADE DYSPLASIA AND CARCINOMA       2. Surgical [P], colon, transverse, polyp (3) :      SESSILE SERRATED POLYP WITHOUT CYTOLOGIC DYSPLASIA, MULTIPLE FRAGMENTS      INCIDENTAL BENIGN SCHWANN CELL HAMARTOMA       3. Surgical [P], right colon sites :      BENIGN COLONIC MUCOSA WITH NO DIAGNOSTIC ABNORMALITY       4. Surgical [P], transverse colon sites :      BENIGN COLONIC MUCOSA WITH NO DIAGNOSTIC ABNORMALITY       5. Surgical [P], left colon sites :      BENIGN COLONIC MUCOSA WITH NO DIAGNOSTIC ABNORMALITY   Past Medical History:  Diagnosis Date   Anxiety    Allergy  induced asthma   Arthritis    Asthma    Breast cancer (HCC)    Crohn disease (HCC)    Depression    GERD (gastroesophageal reflux disease)    History of methicillin resistant staphylococcus aureus (MRSA)    pt denies this.   History of radiation therapy    Left Breast- 05/09/22-06/11/22-Dr. Lynwood Nasuti   Multiple thyroid  nodules    Pneumonia    Port-A-Cath in place 03/25/2022   Sleep apnea    CPAP   Thyroid  cancer [facility])      Past Surgical History:  Procedure Laterality Date   ANAL FISSURE REPAIR  2004   BREAST  LUMPECTOMY WITH RADIOACTIVE SEED LOCALIZATION Left 01/01/2022   Procedure: LEFT BREAST SEED LUMPECTOMY;  Surgeon: [name], MD;  Location: [facility] OR;  Service: General;  Laterality: Left;   CESAREAN SECTION     COLONOSCOPY     INSERTION OF MESH N/A 07/26/2020   Procedure: INSERTION OF MESH;  Surgeon: [name], MD;  Location: [facility] OR;  Service: General;  Laterality: N/A;   LIGAMENT REPAIR Right 11/23/2015   Procedure: right index radial collateral LIGAMENT REPAIR;  Surgeon: [name], MD;  Location: [facility];  Service: Orthopedics;  Laterality: Right;   PORTACATH PLACEMENT Right 02/07/2022   Procedure: INSERTION PORT-A-CATH;  Surgeon: [name], MD;  Location: [facility] OR;  Service: General;  Laterality: Right;   RE-EXCISION OF BREAST LUMPECTOMY Left 01/22/2022   Procedure: RE-EXCISION OF LEFT BREAST LUMPECTOMY;  Surgeon: [name], MD;  Location: [facility];  Service: General;  Laterality: Left;   SENTINEL NODE BIOPSY Left 01/22/2022   Procedure: LEFT SENTINEL NODE BIOPSY;  Surgeon: [name], MD;  Location: [facility];  Service: General;  Laterality: Left;   THYROIDECTOMY N/A 01/01/2022   Procedure: TOTAL THYROIDECTOMY WITH LIMITED LYMPH NODE DISSECTION;  Surgeon: [name], MD;  Location: [facility] OR;  Service: General;  Laterality: N/A;   UPPER ENDOSCOPY W/ ESOPHAGEAL MANOMETRY  10/2021   VENTRAL HERNIA REPAIR N/A 07/26/2020   Procedure: LAPAROSCOPIC VENTRAL HERNIA REPAIR WITH MESH;  Surgeon: [name], MD;  Location: [facility] OR;  Service: General;  Laterality: N/A;   Family History  Problem Relation Age of Onset   Alcohol abuse Mother    Alcohol abuse Sister    Angioedema Neg Hx    Allergic rhinitis Neg Hx    Asthma Neg Hx    Atopy Neg Hx    Eczema Neg Hx    Immunodeficiency Neg Hx    Urticaria Neg Hx    Colon cancer Neg Hx    Stomach cancer Neg Hx    Esophageal cancer Neg Hx    Colon polyps Neg Hx    Social  History   Tobacco Use   Smoking status: Never    Passive exposure: Never   Smokeless tobacco: Never  Vaping Use   Vaping status: Never Used  Substance Use Topics   Alcohol use: Yes    Alcohol/week: 10.0 standard drinks of alcohol    Types: 10 Glasses of wine per week    Comment: 10 glasses/week   Drug use: No   Current Outpatient Medications  Medication Sig Dispense Refill   adalimumab  (HUMIRA , 2 SYRINGE,) 40 MG/0.4ML prefilled syringe Inject 0.4 mLs (40 mg total) under the skin every 14 (fourteen) days. 2 each 5   albuterol  (VENTOLIN  HFA) 108 (90 Base) MCG/ACT inhaler Inhale 2 puffs into the lungs every 6 (six) hours as needed for wheezing or shortness of breath. 6.7 g 3   anastrozole  (ARIMIDEX ) 1 MG tablet Take 1 tablet (1 mg total) by mouth daily. 90 tablet 3   ascorbic acid (VITAMIN C) 500 MG tablet Take 500 mg by mouth 2 (two) times a week.     bimatoprost  (LUMIGAN ) 0.01 % SOLN Instill 1 drop into both eyes at bedtime 2.5 mL 10   busPIRone  (BUSPAR ) 7.5 MG tablet Take 1 tablet (7.5 mg total) by mouth 2 (two) times daily. 180 tablet 2   celecoxib  (CELEBREX ) 100 MG capsule Take 1 capsule (100 mg total) by mouth in the morning and at bedtime. 60 capsule 11   cetirizine (ZYRTEC) 10 MG tablet Take 10 mg by mouth in the morning.     DULoxetine  (CYMBALTA ) 60 MG capsule Take 1 capsule (60 mg total) by mouth daily. 90 capsule 3   ezetimibe  (ZETIA ) 10 MG tablet Take 1 tablet (10 mg total) by mouth daily. 90 tablet 3   fluocinonide ointment (LIDEX) 0.05 % Apply 1 application  topically daily as needed (irritation).     levothyroxine  (SYNTHROID ) 50 MCG tablet Take 2 tablets (100 mcg total) by mouth daily before breakfast. 180 tablet 3   Magnesium 250 MG TABS Take by mouth.     omeprazole  (PRILOSEC) 20 MG capsule Take 1 capsule (20 mg total) by mouth daily. 90 capsule 1   No current facility-administered medications for this visit.   Allergies  Allergen Reactions   Gabapentin Itching    Hydrocodone  Itching   Oxycodone  Hcl Itching    NDC  Rniz:[id number]  NDC Rniz:[id number]     Review of Systems: All systems reviewed and negative except where noted in HPI.   No recent labs on file  Physical Exam: BP 102/68   Pulse 96   Ht 5' 2 (1.575 m)   Wt 140 lb 4 oz (63.6 kg)   LMP 01/10/2014   BMI 25.65 kg/m  Constitutional: Pleasant,well-developed, female in no acute distress. Neurological: Alert and oriented to person place and time. Skin: Skin is warm and dry. No rashes noted. Psychiatric: Normal mood and affect. Behavior is normal.   ASSESSMENT: 63 y.o. female here for assessment of the following  1. Eosinophilic esophagitis   2. Esophageal stricture   3. Gastroesophageal reflux disease, unspecified whether esophagitis present   4. Crohn's disease of colon without complication (HCC)    Dysphagia and reflux is the most bothersome symptom that is bothering her right now.  I reviewed her last endoscopy with her which was almost 2 years ago.  Suspect she has had some recurrent stricturing at the [facility] which is where she had problems previously.  On omeprazole  20 mg daily, down from 40 mg daily before, it does not appear to be working as well as it used to and having recurrent reflux.  Discussed options.  Recommend she take the omeprazole  prior to meals to help with absorption.  Would increase her dosing to 40 mg daily at this point in time to get better control of her reflux.  For her dysphagia I offered her an EGD to further evaluate and dilate suspected stricture causing her symptoms.  We discussed what this entails.  She would really want to hold off on this right now unless her symptoms worsen or become more problematic, she will think about it.  Discussed risks of food impaction with her which she has had in the past, definitely to food well to reduce this risk.  She can contact me to schedule EGD at her convenience when she feels she is ready for it.  Otherwise her  Crohn's disease is well-controlled, on Humira  and tolerating it well.  She is due for QuantiFERON gold and basic labs and we will send her to the lab today.  She does have some baseline constipation bothering her and recommend MiraLAX  to use as needed and titrate up or down.  Vaccinations up-to-date.   PLAN: - increase omeprazole  to 40mg  / day - take before meal - counseled on long term risks of chronic PPI use, benefits > risks in her case - recommended EGD - she wants to think about it and hold off right now if possible. Discussed risks for impaction, etc, chew food well. She will call to schedule if / when she wishes to proceed - continue Humira  - lab today - CBC, CMET, quantiferon gold - Miralax  daily OTC and titrate as needed - vaccines UTD - f/u 6 months   Marcey Naval, MD [facility]

## 2023-07-29 ENCOUNTER — Other Ambulatory Visit (HOSPITAL_COMMUNITY): Payer: Self-pay

## 2023-07-29 ENCOUNTER — Other Ambulatory Visit (INDEPENDENT_AMBULATORY_CARE_PROVIDER_SITE_OTHER)

## 2023-07-29 ENCOUNTER — Ambulatory Visit (INDEPENDENT_AMBULATORY_CARE_PROVIDER_SITE_OTHER): Admitting: Gastroenterology

## 2023-07-29 ENCOUNTER — Encounter: Payer: Self-pay | Admitting: Gastroenterology

## 2023-07-29 ENCOUNTER — Ambulatory Visit: Payer: Self-pay | Admitting: Gastroenterology

## 2023-07-29 VITALS — BP 102/68 | HR 96 | Ht 62.0 in | Wt 140.2 lb

## 2023-07-29 DIAGNOSIS — K219 Gastro-esophageal reflux disease without esophagitis: Secondary | ICD-10-CM

## 2023-07-29 DIAGNOSIS — K200 Eosinophilic esophagitis: Secondary | ICD-10-CM | POA: Diagnosis not present

## 2023-07-29 DIAGNOSIS — K222 Esophageal obstruction: Secondary | ICD-10-CM

## 2023-07-29 DIAGNOSIS — K5010 Crohn's disease of large intestine without complications: Secondary | ICD-10-CM

## 2023-07-29 LAB — COMPREHENSIVE METABOLIC PANEL WITH GFR
ALT: 18 U/L (ref 0–35)
AST: 23 U/L (ref 0–37)
Albumin: 4.6 g/dL (ref 3.5–5.2)
Alkaline Phosphatase: 70 U/L (ref 39–117)
BUN: 19 mg/dL (ref 6–23)
CO2: 29 meq/L (ref 19–32)
Calcium: 9.6 mg/dL (ref 8.4–10.5)
Chloride: 101 meq/L (ref 96–112)
Creatinine, Ser: 0.72 mg/dL (ref 0.40–1.20)
GFR: 89.25 mL/min (ref >60.00)
Glucose, Bld: 85 mg/dL (ref 70–99)
Potassium: 4.1 meq/L (ref 3.5–5.1)
Sodium: 137 meq/L (ref 135–145)
Total Bilirubin: 0.5 mg/dL (ref 0.2–1.2)
Total Protein: 7.6 g/dL (ref 6.0–8.3)

## 2023-07-29 LAB — CBC WITH DIFFERENTIAL/PLATELET
Basophils Absolute: 0.1 K/uL (ref 0.0–0.1)
Basophils Relative: 0.8 % (ref 0.0–3.0)
Eosinophils Absolute: 0.2 K/uL (ref 0.0–0.7)
Eosinophils Relative: 2.1 % (ref 0.0–5.0)
HCT: 40.7 % (ref 36.0–46.0)
Hemoglobin: 13.3 g/dL (ref 12.0–15.0)
Lymphocytes Relative: 21.9 % (ref 12.0–46.0)
Lymphs Abs: 1.9 K/uL (ref 0.7–4.0)
MCHC: 32.7 g/dL (ref 30.0–36.0)
MCV: 92.9 fl (ref 78.0–100.0)
Monocytes Absolute: 0.7 K/uL (ref 0.1–1.0)
Monocytes Relative: 8 % (ref 3.0–12.0)
Neutro Abs: 5.7 K/uL (ref 1.4–7.7)
Neutrophils Relative %: 67.2 % (ref 43.0–77.0)
Platelets: 270 K/uL (ref 150.0–400.0)
RBC: 4.38 Mil/uL (ref 3.87–5.11)
RDW: 13.8 % (ref 11.5–15.5)
WBC: 8.5 K/uL (ref 4.0–10.5)

## 2023-07-29 MED ORDER — OMEPRAZOLE 40 MG PO CPDR
40.0000 mg | DELAYED_RELEASE_CAPSULE | Freq: Two times a day (BID) | ORAL | 2 refills | Status: DC
  Filled 2023-07-29: qty 60, 30d supply, fill #0

## 2023-07-29 MED ORDER — POLYETHYLENE GLYCOL 3350 17 G PO PACK: 17.0000 g | PACK | Freq: Every day | ORAL | Status: DC

## 2023-07-29 MED ORDER — POLYETHYLENE GLYCOL 3350 17 G PO PACK: 17.0000 g | PACK | Freq: Every day | ORAL | Status: DC | PRN

## 2023-07-29 MED ORDER — OMEPRAZOLE 40 MG PO CPDR
40.0000 mg | DELAYED_RELEASE_CAPSULE | Freq: Every day | ORAL | 2 refills | Status: DC
  Filled 2023-07-29: qty 30, 30d supply, fill #0
  Filled 2023-08-24: qty 30, 30d supply, fill #1
  Filled 2023-09-23: qty 30, 30d supply, fill #2

## 2023-07-29 NOTE — Patient Instructions (Signed)
 It has been recommended to you by your physician that you have a(n) EGD completed. Per your request, we did not schedule the procedure(s) today. Please contact our office at [phone] should you decide to have the procedure completed. You will be scheduled for a pre-visit and procedure at that time.  Increase omeprazole  40mg  once daily.   Conintue Humira  Please go to the lab in the basement of our building to have lab work done as you leave today. Hit B for basement when you get on the elevator.  When the doors open the lab is on your left.  We will call you with the results. Thank you.  Please purchase the following medications over the counter and take as directed: Miralax : Take once daily and titrate as needed  Please follow up in 6 months = January 2026. We will send you a reminder when it is time to schedule an appointment.  Thank you for entrusting me with your care and for choosing [facility], Dr. Elspeth Naval    If your blood pressure at your visit was 140/90 or greater, please contact your primary care physician to follow up on this. ______________________________________________________  If you are age 45 or older, your body mass index should be between 23-30. Your Body mass index is 25.65 kg/m. If this is out of the aforementioned range listed, please consider follow up with your Primary Care Provider.  If you are age 39 or younger, your body mass index should be between 19-25. Your Body mass index is 25.65 kg/m. If this is out of the aformentioned range listed, please consider follow up with your Primary Care Provider.  ________________________________________________________  The [facility] providers would like to encourage you to use MYCHART to communicate with providers for non-urgent requests or questions.  Due to long hold times on the telephone, sending your provider a message by [facility] may be a faster and more efficient way to get a response.  Please  allow 48 business hours for a response.  Please remember that this is for non-urgent requests.  _______________________________________________________  Due to recent changes in healthcare laws, you may see the results of your imaging and laboratory studies on MyChart before your provider has had a chance to review them.  We understand that in some cases there may be results that are confusing or concerning to you. Not all laboratory results come back in the same time frame and the provider may be waiting for multiple results in order to interpret others.  Please give us  48 hours in order for your provider to thoroughly review all the results before contacting the office for clarification of your results.

## 2023-07-31 LAB — QUANTIFERON-TB GOLD PLUS
Mitogen-NIL: >10 [IU]/mL
NIL: 0.02 [IU]/mL
QuantiFERON-TB Gold Plus: NEGATIVE
TB1-NIL: 0 [IU]/mL
TB2-NIL: 0 [IU]/mL

## 2023-08-01 ENCOUNTER — Other Ambulatory Visit: Payer: Self-pay | Admitting: Gastroenterology

## 2023-08-01 ENCOUNTER — Other Ambulatory Visit: Payer: Self-pay

## 2023-08-01 DIAGNOSIS — K5010 Crohn's disease of large intestine without complications: Secondary | ICD-10-CM

## 2023-08-01 NOTE — Progress Notes (Signed)
 Specialty Pharmacy Refill Coordination Note  [name] is a 63 y.o. female contacted today regarding refills of specialty medication(s) Adalimumab  (Humira  (2 Syringe))   Patient requested [name] at [facility] at [place] date: 08/08/23   Medication will be filled on 07.24.25.   This fill date is pending response to refill request from provider. Patient is aware and if they have not received fill by intended date they must follow up with pharmacy.   Patient notified [facility] denied refill request twice and she will reach out to office.

## 2023-08-03 ENCOUNTER — Other Ambulatory Visit: Payer: Self-pay | Admitting: Internal Medicine

## 2023-08-03 DIAGNOSIS — K5010 Crohn's disease of large intestine without complications: Secondary | ICD-10-CM

## 2023-08-04 ENCOUNTER — Other Ambulatory Visit: Payer: Self-pay

## 2023-08-04 ENCOUNTER — Other Ambulatory Visit (HOSPITAL_COMMUNITY): Payer: Self-pay

## 2023-08-04 MED ORDER — HUMIRA (2 SYRINGE) 40 MG/0.4ML ~~LOC~~ PSKT
40.0000 mg | PREFILLED_SYRINGE | SUBCUTANEOUS | 5 refills | Status: DC
  Filled 2023-08-04: qty 2, 28d supply, fill #0

## 2023-08-06 ENCOUNTER — Other Ambulatory Visit: Payer: Self-pay | Admitting: Pharmacist

## 2023-08-06 ENCOUNTER — Encounter (INDEPENDENT_AMBULATORY_CARE_PROVIDER_SITE_OTHER): Payer: Self-pay

## 2023-08-06 ENCOUNTER — Other Ambulatory Visit: Payer: Self-pay

## 2023-08-06 DIAGNOSIS — K5010 Crohn's disease of large intestine without complications: Secondary | ICD-10-CM

## 2023-08-06 MED ORDER — HUMIRA (2 SYRINGE) 40 MG/0.4ML ~~LOC~~ PSKT
40.0000 mg | PREFILLED_SYRINGE | SUBCUTANEOUS | 5 refills | Status: DC
  Filled 2023-08-06: qty 2, 28d supply, fill #0
  Filled 2023-09-11: qty 2, 28d supply, fill #1
  Filled 2023-10-10: qty 2, 28d supply, fill #2
  Filled 2023-11-07 – 2023-11-11 (×3): qty 2, 28d supply, fill #3

## 2023-08-06 NOTE — Progress Notes (Signed)
 Patient requested picked up on 7.29.25

## 2023-08-08 NOTE — Progress Notes (Signed)
 [name] [address] [place] [address] Phone: [phone] Subjective:   [name] am a scribe for [name].   I'm seeing this patient by the request  of:  [name], [name], MD  CC: Right hand pain, back pain  YEP:Dlagzrupcz  06/16/2023 Discussed with patient at great length, discussed icing regimen of home exercises, discussed which activities to do and which ones to avoid.  Increase activity slowly.  Follow-up again in 6 to 8 weeks otherwise.     Updated 08/11/2023 [name] is a 63 y.o. female coming in with complaint of R hand pain. Patient states it is there and constant. Nothing that she has tried has helped.        Past Medical History:  Diagnosis Date   Anxiety    Allergy  induced asthma   Arthritis    Asthma    Breast cancer (HCC)    Crohn disease (HCC)    Depression    GERD (gastroesophageal reflux disease)    History of methicillin resistant staphylococcus aureus (MRSA)    pt denies this.   History of radiation therapy    Left Breast- 05/09/22-06/11/22-[name]   Multiple thyroid  nodules    Pneumonia    Port-A-Cath in place 03/25/2022   Sleep apnea    CPAP   Thyroid  cancer [facility])    Past Surgical History:  Procedure Laterality Date   ANAL FISSURE REPAIR  2004   BREAST LUMPECTOMY WITH RADIOACTIVE SEED LOCALIZATION Left 01/01/2022   Procedure: LEFT BREAST SEED LUMPECTOMY;  Surgeon: Stacey Ned, MD;  Location: [facility] OR;  Service: General;  Laterality: Left;   CESAREAN SECTION     COLONOSCOPY     INSERTION OF MESH N/A 07/26/2020   Procedure: INSERTION OF MESH;  Surgeon: Stacey Calamity, MD;  Location: [facility] OR;  Service: General;  Laterality: N/A;   LIGAMENT REPAIR Right 11/23/2015   Procedure: right index radial collateral LIGAMENT REPAIR;  Surgeon: [name], MD;  Location: [facility];  Service: Orthopedics;  Laterality: Right;   PORTACATH PLACEMENT Right 02/07/2022   Procedure:  INSERTION PORT-A-CATH;  Surgeon: Stacey Ned, MD;  Location: [facility] OR;  Service: General;  Laterality: Right;   RE-EXCISION OF BREAST LUMPECTOMY Left 01/22/2022   Procedure: RE-EXCISION OF LEFT BREAST LUMPECTOMY;  Surgeon: Stacey Ned, MD;  Location: [facility];  Service: General;  Laterality: Left;   SENTINEL NODE BIOPSY Left 01/22/2022   Procedure: LEFT SENTINEL NODE BIOPSY;  Surgeon: Stacey Ned, MD;  Location: [facility];  Service: General;  Laterality: Left;   THYROIDECTOMY N/A 01/01/2022   Procedure: TOTAL THYROIDECTOMY WITH LIMITED LYMPH NODE DISSECTION;  Surgeon: Stacey Boas, MD;  Location: [facility] OR;  Service: General;  Laterality: N/A;   UPPER ENDOSCOPY W/ ESOPHAGEAL MANOMETRY  10/2021   VENTRAL HERNIA REPAIR N/A 07/26/2020   Procedure: LAPAROSCOPIC VENTRAL HERNIA REPAIR WITH MESH;  Surgeon: Stacey Calamity, MD;  Location: [facility] OR;  Service: General;  Laterality: N/A;   Social History   Socioeconomic History   Marital status: Married    Spouse name: Not on file   Number of children: 2   Years of education: Not on file   Highest education level: Not on file  Occupational History   Occupation: Self employed  Tobacco Use   Smoking status: Never    Passive exposure: Never   Smokeless tobacco: Never  Vaping Use   Vaping status: Never Used  Substance and Sexual  Activity   Alcohol use: Yes    Alcohol/week: 10.0 standard drinks of alcohol    Types: 10 Glasses of wine per week    Comment: 10 glasses/week   Drug use: No   Sexual activity: Yes    Birth control/protection: Post-menopausal  Other Topics Concern   Not on file  Social History Narrative   Not on file   Social Drivers of Health   Financial Resource Strain: Low Risk  (09/17/2022)   Overall Financial Resource Strain (CARDIA)    Difficulty of Paying Living Expenses: Not hard at all  Food Insecurity: No Food Insecurity (09/17/2022)   Hunger Vital Sign    Worried About Running Out of Food  in the Last Year: Never true    Ran Out of Food in the Last Year: Never true  Transportation Needs: No Transportation Needs (09/17/2022)   PRAPARE - Administrator, Civil Service (Medical): No    Lack of Transportation (Non-Medical): No  Physical Activity: Sufficiently Active (09/17/2022)   Exercise Vital Sign    Days of Exercise per Week: 5 days    Minutes of Exercise per Session: 60 min  Stress: Stress Concern Present (09/17/2022)   [employer] of Occupational Health - Occupational Stress Questionnaire    Feeling of Stress : To some extent  Social Connections: Socially Integrated (09/17/2022)   Social Connection and Isolation Panel    Frequency of Communication with Friends and Family: Three times a week    Frequency of Social Gatherings with Friends and Family: Once a week    Attends Religious Services: More than 4 times per year    Active Member of [employer] or Organizations: Yes    Attends Engineer, structural: More than 4 times per year    Marital Status: Married   Allergies  Allergen Reactions   Gabapentin Itching   Hydrocodone  Itching   Oxycodone  Hcl Itching    NDC Rniz:[id number]  NDC Rniz:[id number]   Family History  Problem Relation Age of Onset   Alcohol abuse Mother    Alcohol abuse Sister    Angioedema Neg Hx    Allergic rhinitis Neg Hx    Asthma Neg Hx    Atopy Neg Hx    Eczema Neg Hx    Immunodeficiency Neg Hx    Urticaria Neg Hx    Colon cancer Neg Hx    Stomach cancer Neg Hx    Esophageal cancer Neg Hx    Colon polyps Neg Hx     Current Outpatient Medications (Endocrine & Metabolic):    levothyroxine  (SYNTHROID ) 50 MCG tablet, Take 2 tablets (100 mcg total) by mouth daily before breakfast.  Current Outpatient Medications (Cardiovascular):    ezetimibe  (ZETIA ) 10 MG tablet, Take 1 tablet (10 mg total) by mouth daily.  Current Outpatient Medications (Respiratory):    albuterol  (VENTOLIN  HFA) 108 (90 Base) MCG/ACT inhaler, Inhale 2  puffs into the lungs every 6 (six) hours as needed for wheezing or shortness of breath.   cetirizine (ZYRTEC) 10 MG tablet, Take 10 mg by mouth in the morning.  Current Outpatient Medications (Analgesics):    adalimumab  (HUMIRA , 2 SYRINGE,) 40 MG/0.4ML prefilled syringe, Inject 0.4 mLs (40 mg total) under the skin every 14 (fourteen) days.   celecoxib  (CELEBREX ) 100 MG capsule, Take 1 capsule (100 mg total) by mouth in the morning and at bedtime.   Current Outpatient Medications (Other):    anastrozole  (ARIMIDEX ) 1 MG tablet, Take 1 tablet (1 mg total) by  mouth daily.   ascorbic acid (VITAMIN C) 500 MG tablet, Take 500 mg by mouth 2 (two) times a week.   bimatoprost  (LUMIGAN ) 0.01 % SOLN, Instill 1 drop into both eyes at bedtime   busPIRone  (BUSPAR ) 7.5 MG tablet, Take 1 tablet (7.5 mg total) by mouth 2 (two) times daily.   DULoxetine  (CYMBALTA ) 60 MG capsule, Take 1 capsule (60 mg total) by mouth daily.   fluocinonide ointment (LIDEX) 0.05 %, Apply 1 application  topically daily as needed (irritation).   Magnesium 250 MG TABS, Take by mouth.   omeprazole  (PRILOSEC) 40 MG capsule, Take 1 capsule (40 mg total) by mouth daily before supper.   polyethylene glycol (MIRALAX ) 17 g packet, Take 17 g by mouth daily. And titrate as needed   Reviewed prior external information including notes and imaging from  primary care provider As well as notes that were available from care everywhere and other healthcare systems.  Past medical history, social, surgical and family history all reviewed in electronic medical record.  No pertanent information unless stated regarding to the chief complaint.   Review of Systems:  No headache, visual changes, nausea, vomiting, diarrhea, constipation, dizziness, abdominal pain, skin rash, fevers, chills, night sweats, weight loss, swollen lymph nodes, body aches, joint swelling, chest pain, shortness of breath, mood changes. POSITIVE muscle aches  Objective  Blood  pressure 126/70, pulse 80, height 5' 2 (1.575 m), weight 142 lb (64.4 kg), last menstrual period 01/10/2014, SpO2 97%.   General: No apparent distress alert and oriented x3 mood and affect normal, dressed appropriately.  HEENT: Pupils equal, extraocular movements intact  Respiratory: Patient's speak in full sentences and does not appear short of breath  Cardiovascular: No lower extremity edema, non tender, no erythema  Back pain does seem to be more in the parascapular area.  Tightness noted.  Low back does have some tenderness noted as well.  Very mild overall.  FABER test is positive on the right side  Osteopathic findings C3 flexed rotated and side bent right C4 flexed rotated and side bent left C6 flexed rotated and side bent left T3 extended rotated and side bent right inhaled third rib T9 extended rotated and side bent left T11 extended rotated and side bent left L2 flexed rotated and side bent right L5 flexed rotated and side bent left Sacrum right on right     Impression and Recommendations:    Arthritis of carpometacarpal (CMC) joint of right thumb Patient has brought this up before.  Discussed icing regimen of home exercises, discussed with patient to continue to monitor.  Increase activity slowly.  Follow-up again in 6 to 8 weeks    Decision today to treat with OMT was based on Physical Exam  After verbal consent patient was treated with HVLA, ME, FPR techniques in cervical, thoracic, rib, lumbar and sacral areas, all areas are chronic   Patient tolerated the procedure well with improvement in symptoms  Patient given exercises, stretches and lifestyle modifications  See medications in patient instructions if given  Patient will follow up in 4-8 weeks   The above documentation has been reviewed and is accurate and complete Delberta Folts M Zanaria Morell, DO

## 2023-08-11 ENCOUNTER — Encounter: Payer: Self-pay | Admitting: Family Medicine

## 2023-08-11 ENCOUNTER — Ambulatory Visit (INDEPENDENT_AMBULATORY_CARE_PROVIDER_SITE_OTHER): Admitting: Family Medicine

## 2023-08-11 ENCOUNTER — Other Ambulatory Visit: Payer: Self-pay

## 2023-08-11 VITALS — BP 126/70 | HR 80 | Ht 62.0 in | Wt 142.0 lb

## 2023-08-11 DIAGNOSIS — M1811 Unilateral primary osteoarthritis of first carpometacarpal joint, right hand: Secondary | ICD-10-CM

## 2023-08-11 DIAGNOSIS — M67441 Ganglion, right hand: Secondary | ICD-10-CM

## 2023-08-11 DIAGNOSIS — M79641 Pain in right hand: Secondary | ICD-10-CM | POA: Diagnosis not present

## 2023-08-11 DIAGNOSIS — M9904 Segmental and somatic dysfunction of sacral region: Secondary | ICD-10-CM

## 2023-08-11 DIAGNOSIS — M9901 Segmental and somatic dysfunction of cervical region: Secondary | ICD-10-CM

## 2023-08-11 DIAGNOSIS — M9903 Segmental and somatic dysfunction of lumbar region: Secondary | ICD-10-CM | POA: Diagnosis not present

## 2023-08-11 DIAGNOSIS — M9902 Segmental and somatic dysfunction of thoracic region: Secondary | ICD-10-CM | POA: Diagnosis not present

## 2023-08-11 DIAGNOSIS — M9908 Segmental and somatic dysfunction of rib cage: Secondary | ICD-10-CM | POA: Diagnosis not present

## 2023-08-11 NOTE — Assessment & Plan Note (Signed)
 Patient has brought this up before.  Discussed icing regimen of home exercises, discussed with patient to continue to monitor.  Increase activity slowly.  Follow-up again in 6 to 8 weeks

## 2023-08-11 NOTE — Assessment & Plan Note (Signed)
 Still having some pain.  Will ultrasound at next 1.  Hand pain may be from multiple different reasons and we did discuss nerve conduction study.  Patient will consider.

## 2023-08-11 NOTE — Patient Instructions (Addendum)
 Good to see you. Enjoy the rest of your summer and Dish. See me again in 2 months.

## 2023-08-15 ENCOUNTER — Other Ambulatory Visit: Payer: Self-pay

## 2023-08-24 ENCOUNTER — Other Ambulatory Visit (HOSPITAL_COMMUNITY): Payer: Self-pay

## 2023-08-25 ENCOUNTER — Other Ambulatory Visit (HOSPITAL_COMMUNITY): Payer: Self-pay

## 2023-08-25 ENCOUNTER — Encounter (HOSPITAL_COMMUNITY): Payer: Self-pay

## 2023-08-25 ENCOUNTER — Other Ambulatory Visit: Payer: Self-pay

## 2023-08-25 MED ORDER — BIMATOPROST 0.01 % OP SOLN
1.0000 [drp] | Freq: Every day | OPHTHALMIC | 11 refills | Status: AC
  Filled 2023-08-25 – 2023-09-23 (×2): qty 2.5, 25d supply, fill #0
  Filled 2023-10-15: qty 2.5, 25d supply, fill #1
  Filled 2023-11-20: qty 2.5, 25d supply, fill #2
  Filled 2023-12-18 – 2023-12-30 (×2): qty 2.5, 25d supply, fill #3

## 2023-08-29 ENCOUNTER — Other Ambulatory Visit (HOSPITAL_COMMUNITY): Payer: Self-pay

## 2023-08-29 ENCOUNTER — Other Ambulatory Visit: Payer: Self-pay | Admitting: Internal Medicine

## 2023-09-01 ENCOUNTER — Other Ambulatory Visit (HOSPITAL_COMMUNITY): Payer: Self-pay

## 2023-09-01 MED ORDER — EZETIMIBE 10 MG PO TABS
10.0000 mg | ORAL_TABLET | Freq: Every day | ORAL | 3 refills | Status: DC
  Filled 2023-12-08: qty 90, 90d supply, fill #0

## 2023-09-03 ENCOUNTER — Other Ambulatory Visit (HOSPITAL_COMMUNITY): Payer: Self-pay

## 2023-09-10 ENCOUNTER — Other Ambulatory Visit: Payer: Self-pay

## 2023-09-10 ENCOUNTER — Other Ambulatory Visit (HOSPITAL_COMMUNITY): Payer: Self-pay

## 2023-09-11 ENCOUNTER — Other Ambulatory Visit (HOSPITAL_COMMUNITY): Payer: Self-pay

## 2023-09-12 ENCOUNTER — Other Ambulatory Visit (HOSPITAL_COMMUNITY): Payer: Self-pay

## 2023-09-16 ENCOUNTER — Other Ambulatory Visit: Payer: Self-pay

## 2023-09-16 NOTE — Progress Notes (Signed)
 Specialty Pharmacy Refill Coordination Note  [name] is a 63 y.o. female contacted today regarding refills of specialty medication(s) Adalimumab  (Humira  (2 Syringe))   Patient requested Pickup at [facility] [place] date: 09/16/23   Medication will be filled on 09/16/23.

## 2023-09-23 ENCOUNTER — Other Ambulatory Visit (HOSPITAL_COMMUNITY): Payer: Self-pay

## 2023-09-25 ENCOUNTER — Other Ambulatory Visit (HOSPITAL_COMMUNITY): Payer: Self-pay

## 2023-09-25 DIAGNOSIS — L821 Other seborrheic keratosis: Secondary | ICD-10-CM | POA: Diagnosis not present

## 2023-09-25 DIAGNOSIS — L814 Other melanin hyperpigmentation: Secondary | ICD-10-CM | POA: Diagnosis not present

## 2023-09-25 DIAGNOSIS — L820 Inflamed seborrheic keratosis: Secondary | ICD-10-CM | POA: Diagnosis not present

## 2023-09-25 DIAGNOSIS — L738 Other specified follicular disorders: Secondary | ICD-10-CM | POA: Diagnosis not present

## 2023-09-25 MED ORDER — CLINDAMYCIN PHOSPHATE 1 % EX LOTN
1.0000 | TOPICAL_LOTION | Freq: Two times a day (BID) | CUTANEOUS | 3 refills | Status: AC
  Filled 2023-09-25: qty 60, 30d supply, fill #0
  Filled 2023-10-21: qty 60, 30d supply, fill #1
  Filled 2023-12-08: qty 60, 30d supply, fill #2
  Filled 2024-01-04: qty 60, 30d supply, fill #3

## 2023-10-09 NOTE — Progress Notes (Signed)
 [name] [address] [place] [address] Phone: [phone] Subjective:   [name], am serving as a scribe for Dr. Arthea [name].  I'm seeing this patient by the request  of:  [name], Ronal PARAS, MD  CC: Back and neck pain hand pain  YEP:Dlagzrupcz  [name] is a 63 y.o. female coming in with complaint of back and neck pain. OMT on 08/11/2023. Also seen for hand pain. Patient states R hand pain arthritis is worse , cyst back, trigger finger triggering.  Medications patient has been prescribed:   Taking:         Reviewed prior external information including notes and imaging from previsou exam, outside providers and external EMR if available.   As well as notes that were available from care everywhere and other healthcare systems.  Past medical history, social, surgical and family history all reviewed in electronic medical record.  No pertanent information unless stated regarding to the chief complaint.   Past Medical History:  Diagnosis Date   Anxiety    Allergy  induced asthma   Arthritis    Asthma    Breast cancer (HCC)    Crohn disease (HCC)    Depression    GERD (gastroesophageal reflux disease)    History of methicillin resistant staphylococcus aureus (MRSA)    pt denies this.   History of radiation therapy    Left Breast- 05/09/22-06/11/22-Dr. Lynwood Nasuti   Multiple thyroid  nodules    Pneumonia    Port-A-Cath in place 03/25/2022   Sleep apnea    CPAP   Thyroid  cancer (HCC)     Allergies  Allergen Reactions   Gabapentin Itching   Hydrocodone  Itching   Oxycodone  Hcl Itching    NDC Rniz:[id number]  NDC Rniz:[id number]     Review of Systems:  No headache, visual changes, nausea, vomiting, diarrhea, constipation, dizziness, abdominal pain, skin rash, fevers, chills, night sweats, weight loss, swollen lymph nodes, body aches, joint swelling, chest pain, shortness of breath, mood changes. POSITIVE muscle  aches  Objective  Blood pressure 124/80, pulse 97, height 5' 2 (1.575 m), weight 145 lb (65.8 kg), last menstrual period 01/10/2014, SpO2 100%.   General: No apparent distress alert and oriented x3 mood and affect normal, dressed appropriately.  HEENT: Pupils equal, extraocular movements intact  Respiratory: Patient's speak in full sentences and does not appear short of breath  Cardiovascular: No lower extremity edema, non tender, no erythema   Right hand still has fullness noted between the 4th and 5th fingers on the palmar aspect. Second MCP is quite severely tender, some limited range of motion of this finger noted.   Procedure: Real-time Ultrasound Guided Injection of right second MCP Device: GE Logiq Q7 Ultrasound guided injection is preferred based studies that show increased duration, increased effect, greater accuracy, decreased procedural pain, increased response rate, and decreased cost with ultrasound guided versus blind injection.  Verbal informed consent obtained.  Time-out conducted.  Noted no overlying erythema, induration, or other signs of local infection.  Skin prepped in a sterile fashion.  Local anesthesia: Topical Ethyl chloride.  With sterile technique and under real time ultrasound guidance: With a 25-gauge half inch needle injected with 0.5 cc of 0.5% Marcaine  and 0.5 cc of Kenalog 40 mg/mL. Completed without difficulty  Pain immediately resolved suggesting accurate placement of the medication.  Advised to call if fevers/chills, erythema, induration, drainage, or persistent bleeding.  Impression: Technically successful ultrasound guided injection.  Procedure: Real-time Ultrasound  Guided Injection of right hand ganglion cyst Device: GE Logiq Q7 Ultrasound guided injection is preferred based studies that show increased duration, increased effect, greater accuracy, decreased procedural pain, increased response rate, and decreased cost with ultrasound guided versus  blind injection.  Verbal informed consent obtained.  Time-out conducted.  Noted no overlying erythema, induration, or other signs of local infection.  Skin prepped in a sterile fashion.  Local anesthesia: Topical Ethyl chloride.  With sterile technique and under real time ultrasound guidance: Within 21-gauge needle injected with 0.5 cc of 0.5% Marcaine  and 0.5 cc of Kenalog 40 mg/mL. Completed without difficulty  Pain immediately resolved suggesting accurate placement of the medication.  Advised to call if fevers/chills, erythema, induration, drainage, or persistent bleeding.  Impression: Technically successful ultrasound guided injection.   Assessment and Plan:  Ganglion cyst of finger of right hand Repeat injection given again today, tolerated the procedure well, discussed icing regimen and home exercises, which activities to do and which ones to avoid.  Increase activity slowly.  Discussed icing regimen.  Follow-up again in 6 to 8 weeks  Traumatic arthropathy of metacarpophalangeal (MCP) joint Patient given injection and tolerated the procedure well, patient hopefully will make some improvement with this.  Will see if this is causing some of the decrease in range of motion.  Has been difficult for some time.  Follow-up again in 6 to 8 weeks otherwise.       The above documentation has been reviewed and is accurate and complete Hema Lanza M Teigan Sahli, DO         Note: This dictation was prepared with Dragon dictation along with smaller phrase technology. Any transcriptional errors that result from this process are unintentional.

## 2023-10-10 ENCOUNTER — Other Ambulatory Visit: Payer: Self-pay

## 2023-10-10 NOTE — Progress Notes (Signed)
 Specialty Pharmacy Refill Coordination Note  [name] is a 63 y.o. female contacted today regarding refills of specialty medication(s) Adalimumab  (Humira  (2 Syringe))   Patient requested Delivery   Delivery date: 10/16/23   Verified address: [address], [place], [place] [address]   Medication will be filled on 10.01.25.   Injection due 10.05.25

## 2023-10-14 ENCOUNTER — Ambulatory Visit: Admitting: Family Medicine

## 2023-10-14 ENCOUNTER — Encounter: Payer: Self-pay | Admitting: Family Medicine

## 2023-10-14 ENCOUNTER — Other Ambulatory Visit: Payer: Self-pay

## 2023-10-14 VITALS — BP 124/80 | HR 97 | Ht 62.0 in | Wt 145.0 lb

## 2023-10-14 DIAGNOSIS — M12549 Traumatic arthropathy, unspecified hand: Secondary | ICD-10-CM | POA: Diagnosis not present

## 2023-10-14 DIAGNOSIS — M79641 Pain in right hand: Secondary | ICD-10-CM

## 2023-10-14 DIAGNOSIS — M79644 Pain in right finger(s): Secondary | ICD-10-CM

## 2023-10-14 DIAGNOSIS — M67441 Ganglion, right hand: Secondary | ICD-10-CM | POA: Diagnosis not present

## 2023-10-14 NOTE — Assessment & Plan Note (Signed)
 Repeat injection given again today, tolerated the procedure well, discussed icing regimen and home exercises, which activities to do and which ones to avoid.  Increase activity slowly.  Discussed icing regimen.  Follow-up again in 6 to 8 weeks

## 2023-10-14 NOTE — Assessment & Plan Note (Signed)
 Patient given injection and tolerated the procedure well, patient hopefully will make some improvement with this.  Will see if this is causing some of the decrease in range of motion.  Has been difficult for some time.  Follow-up again in 6 to 8 weeks otherwise.

## 2023-10-14 NOTE — Patient Instructions (Signed)
 Good to see you! Thank you as always See you again in 2 months

## 2023-10-21 ENCOUNTER — Other Ambulatory Visit (HOSPITAL_COMMUNITY): Payer: Self-pay

## 2023-10-21 ENCOUNTER — Other Ambulatory Visit: Payer: Self-pay | Admitting: Gastroenterology

## 2023-10-22 ENCOUNTER — Other Ambulatory Visit (HOSPITAL_COMMUNITY): Payer: Self-pay

## 2023-10-22 ENCOUNTER — Ambulatory Visit: Attending: Internal Medicine | Admitting: Pharmacist

## 2023-10-22 ENCOUNTER — Other Ambulatory Visit: Payer: Self-pay

## 2023-10-22 ENCOUNTER — Other Ambulatory Visit: Payer: Self-pay | Admitting: Pharmacist

## 2023-10-22 DIAGNOSIS — Z79899 Other long term (current) drug therapy: Secondary | ICD-10-CM

## 2023-10-22 MED ORDER — OMEPRAZOLE 40 MG PO CPDR
40.0000 mg | DELAYED_RELEASE_CAPSULE | Freq: Every day | ORAL | 2 refills | Status: DC
  Filled 2023-10-22: qty 30, 30d supply, fill #0
  Filled 2023-12-08: qty 30, 30d supply, fill #1
  Filled 2024-01-04: qty 30, 30d supply, fill #2

## 2023-10-22 NOTE — Progress Notes (Signed)
 S: Patient presents today for review of her Humira .  Patient is currently taking Humira  for Crohn's Disease. Patient is managed by Dr. Leigh.   Adherence: denies any missed doses of Humira .    Dosing:  40 mg every 14 days.   Screening: TB test: completed prior to initiation  Hepatitis: completed prior to initiation  Efficacy: reports that it is working very well for her and that she has not really had any side effects  Monitoring: S/sx of infection: denies CBC: last one WNL (07/29/23) S/sx of hypersensitivity: denies S/sx of malignancy: tx'd by Dr. 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 Currently on anastrazole. Completed surgery, chemotherapy, and radiation since her last visit with me. Now on anastrazole. No issue with continuing Humira  at this time.  S/sx of heart failure: denies  O:  Lab Results  Component Value Date   WBC 8.5 07/29/2023   HGB 13.3 07/29/2023   HCT 40.7 07/29/2023   MCV 92.9 07/29/2023   PLT 270.0 07/29/2023      Chemistry      Component Value Date/Time   NA 137 07/29/2023 1452   K 4.1 07/29/2023 1452   CL 101 07/29/2023 1452   CO2 29 07/29/2023 1452   BUN 19 07/29/2023 1452   CREATININE 0.72 07/29/2023 1452   CREATININE 0.62 09/23/2022 1207      Component Value Date/Time   CALCIUM  9.6 07/29/2023 1452   ALKPHOS 70 07/29/2023 1452   AST 23 07/29/2023 1452   AST 19 04/15/2022 0827   ALT 18 07/29/2023 1452   ALT 21 04/15/2022 0827   BILITOT 0.5 07/29/2023 1452   BILITOT 0.4 04/15/2022 0827     A/P: 1. Medication review: Patient reports good efficacy. Labs have remained stable. She is regularly following with gastroenterologist. Reviewed the medication with the patient.. No recommendations for changes at this time.   Herlene Fleeta Morris, PharmD, JAQUELINE, CPP Clinical Pharmacist [facility] & [facility] [phone]

## 2023-10-22 NOTE — Progress Notes (Signed)
 Called patient to schedule an appointment for the [facility] [employer] Specialty Medication [facility]. I was unable to reach the patient so I left a HIPAA-compliant message requesting that the patient return my call.   Herlene Fleeta Morris, PharmD, JAQUELINE, CPP Clinical Pharmacist [facility] & [facility] [phone]

## 2023-10-28 ENCOUNTER — Other Ambulatory Visit: Payer: Self-pay | Admitting: Medical Genetics

## 2023-11-04 ENCOUNTER — Ambulatory Visit

## 2023-11-04 VITALS — Ht 62.0 in | Wt 141.8 lb

## 2023-11-04 DIAGNOSIS — K200 Eosinophilic esophagitis: Secondary | ICD-10-CM

## 2023-11-04 DIAGNOSIS — K219 Gastro-esophageal reflux disease without esophagitis: Secondary | ICD-10-CM

## 2023-11-04 DIAGNOSIS — K5010 Crohn's disease of large intestine without complications: Secondary | ICD-10-CM

## 2023-11-04 DIAGNOSIS — K222 Esophageal obstruction: Secondary | ICD-10-CM

## 2023-11-04 NOTE — Progress Notes (Signed)
 No egg or soy allergy  known to patient  No issues known to pt with past sedation with any surgeries or procedures Patient denies ever being told they had issues or difficulty with intubation  No FH of Malignant Hyperthermia Pt is not on diet pills Pt is not on  home 02, OSA using CPAP  Pt is not on blood thinners  No A fib or A flutter Have any cardiac testing pending-- no  LOA: independent    Patient's chart reviewed by Norleen Schillings CNRA prior to previsit and patient appropriate for the [facility].  Previsit completed and red dot placed by patient's name on their procedure day (on provider's schedule).     PV completed with patient. Prep instructions sent via mychart, hard copy provided at visit

## 2023-11-07 ENCOUNTER — Other Ambulatory Visit: Payer: Self-pay

## 2023-11-07 ENCOUNTER — Telehealth: Payer: Self-pay

## 2023-11-07 ENCOUNTER — Other Ambulatory Visit (HOSPITAL_COMMUNITY): Payer: Self-pay

## 2023-11-07 NOTE — Telephone Encounter (Signed)
 Pharmacy Patient Advocate Encounter   Received notification from Patient Pharmacy that prior authorization for Humira  is required/requested.   Insurance verification completed.   The patient is insured through [facility].   Per test claim: PA required; PA submitted to above mentioned insurance via Latent Key/confirmation #/EOC [id number] Status is pending

## 2023-11-07 NOTE — Progress Notes (Signed)
 Benefits Investigation Started  Reason: Prior Authorization Required  Routed to: [facility]

## 2023-11-10 ENCOUNTER — Other Ambulatory Visit (HOSPITAL_COMMUNITY): Payer: Self-pay

## 2023-11-10 ENCOUNTER — Other Ambulatory Visit: Payer: Self-pay

## 2023-11-11 ENCOUNTER — Other Ambulatory Visit: Payer: Self-pay

## 2023-11-12 ENCOUNTER — Other Ambulatory Visit: Payer: Self-pay

## 2023-11-14 ENCOUNTER — Other Ambulatory Visit: Payer: Self-pay | Admitting: Pharmacist

## 2023-11-14 ENCOUNTER — Other Ambulatory Visit: Payer: Self-pay

## 2023-11-14 ENCOUNTER — Encounter: Payer: Self-pay | Admitting: Gastroenterology

## 2023-11-14 ENCOUNTER — Other Ambulatory Visit: Payer: Self-pay | Admitting: Gastroenterology

## 2023-11-14 ENCOUNTER — Other Ambulatory Visit (HOSPITAL_COMMUNITY): Payer: Self-pay

## 2023-11-14 MED ORDER — HUMIRA (2 PEN) 40 MG/0.8ML ~~LOC~~ AJKT
40.0000 mg | AUTO-INJECTOR | SUBCUTANEOUS | 11 refills | Status: DC
  Filled 2023-11-14: qty 2, 28d supply, fill #0

## 2023-11-14 MED ORDER — HUMIRA (2 SYRINGE) 40 MG/0.8ML ~~LOC~~ PSKT
40.0000 mg | PREFILLED_SYRINGE | SUBCUTANEOUS | 11 refills | Status: AC
  Filled 2023-11-14 (×2): qty 2, 28d supply, fill #0
  Filled 2023-12-12: qty 2, 28d supply, fill #1
  Filled 2024-01-07: qty 2, 28d supply, fill #2
  Filled 2024-02-11: qty 2, 28d supply, fill #3

## 2023-11-14 MED ORDER — HUMIRA (2 SYRINGE) 40 MG/0.8ML ~~LOC~~ PSKT
40.0000 mg | PREFILLED_SYRINGE | SUBCUTANEOUS | 11 refills | Status: DC
  Filled 2023-11-14 (×2): qty 2, 28d supply, fill #0

## 2023-11-14 MED ORDER — HUMIRA (2 SYRINGE) 40 MG/0.8ML ~~LOC~~ PSKT: 40.0000 mg | PREFILLED_SYRINGE | SUBCUTANEOUS | 11 refills | Status: DC

## 2023-11-14 NOTE — Progress Notes (Signed)
 PA approved for 40mg /0.25ml syringes. Requesting provider to send in syringes. Test claim still shows PA required even though PA shoud be approved. Reaching out to [employer] to look into issue.

## 2023-11-14 NOTE — Telephone Encounter (Signed)
 Good morning I think this may have been routed to me in error. This is one of Dr. Hassan patients. Please see attached regarding patients Humira .

## 2023-11-14 NOTE — Progress Notes (Signed)
 Correct prescription is now on file. Sent to [facility]

## 2023-11-14 NOTE — Telephone Encounter (Signed)
 We received a new prescription for the pens. Can you please send the syringes? Thanks!

## 2023-11-14 NOTE — Telephone Encounter (Signed)
 Pharmacy Patient Advocate Encounter  Received notification from [facility] that Prior Authorization for Humira  40mg /0.4ml syringes has been APPROVED from 11/14/23 to 11/12/24   PA #/Case ID/Reference #: [id number]

## 2023-11-14 NOTE — Telephone Encounter (Signed)
 Insurance will not approve CF Humira  (40mg /.72ml) without an adverse reaction to the regular formulation documented. They also require a MedWatch form be submitted as well.  They will pay for 40mg /0.23ml-please send a new prescription for the non CF formulation to [facility]. Thanks!

## 2023-11-14 NOTE — Telephone Encounter (Signed)
 Sent in again as requested.

## 2023-11-14 NOTE — Progress Notes (Signed)
 A new rx for adalimumab  (HUMIRA , 2 PEN,) 40 MG/0.8ML AJKT pen has been received and profiled in [facility]. A prior authorization is required. Previously on adalimumab  (HUMIRA , 2 SYRINGE,) 40 MG/0.4ML prefilled syringe. Tiffany notified.

## 2023-11-14 NOTE — Telephone Encounter (Signed)
 Previous order cancelled. New script sent in for Humira  40mg /0.13ml sent to [facility] pharmacy as requested.

## 2023-11-17 ENCOUNTER — Other Ambulatory Visit: Payer: Self-pay

## 2023-11-18 ENCOUNTER — Encounter: Payer: Self-pay | Admitting: Gastroenterology

## 2023-11-18 ENCOUNTER — Other Ambulatory Visit (HOSPITAL_COMMUNITY): Payer: Self-pay

## 2023-11-18 ENCOUNTER — Telehealth: Payer: Self-pay

## 2023-11-18 ENCOUNTER — Ambulatory Visit: Admitting: Gastroenterology

## 2023-11-18 VITALS — BP 141/64 | HR 64 | Temp 98.2°F | Resp 14 | Ht 62.0 in | Wt 140.0 lb

## 2023-11-18 DIAGNOSIS — K449 Diaphragmatic hernia without obstruction or gangrene: Secondary | ICD-10-CM

## 2023-11-18 DIAGNOSIS — G4730 Sleep apnea, unspecified: Secondary | ICD-10-CM | POA: Diagnosis not present

## 2023-11-18 DIAGNOSIS — K200 Eosinophilic esophagitis: Secondary | ICD-10-CM | POA: Diagnosis not present

## 2023-11-18 DIAGNOSIS — J45909 Unspecified asthma, uncomplicated: Secondary | ICD-10-CM | POA: Diagnosis not present

## 2023-11-18 DIAGNOSIS — F32A Depression, unspecified: Secondary | ICD-10-CM | POA: Diagnosis not present

## 2023-11-18 DIAGNOSIS — R1310 Dysphagia, unspecified: Secondary | ICD-10-CM

## 2023-11-18 DIAGNOSIS — K222 Esophageal obstruction: Secondary | ICD-10-CM

## 2023-11-18 MED ORDER — SODIUM CHLORIDE 0.9 % IV SOLN: 500.0000 mL | Freq: Once | INTRAVENOUS | Status: AC

## 2023-11-18 NOTE — Telephone Encounter (Signed)
 See note below and advise if Dr. Leigh needs to do anything regarding her Humira .

## 2023-11-18 NOTE — Progress Notes (Signed)
 Called to room to assist during endoscopic procedure.  Patient ID and intended procedure confirmed with present staff. Received instructions for my participation in the procedure from the performing physician.

## 2023-11-18 NOTE — Progress Notes (Signed)
 [facility] History and Physical   Primary Care Physician:  Perri Ronal PARAS, MD   Reason for Procedure:   Eoe, esophageal stricture / dysphagia  Plan:    EGD with possible dilation     HPI: [name] is a 63 y.o. female  here for EGD with possible dilation. 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 On omeprazole  40mg  / day.  Having recurrent dysphagia. GERD seems controlled on the regimen.  Otherwise feels well without any cardiopulmonary symptoms.   I have discussed risks / benefits of anesthesia and endoscopic procedure with [name] and they wish to proceed with the exams as outlined today.    Past Medical History:  Diagnosis Date   Anxiety    Allergy  induced asthma   Arthritis    Asthma    Breast cancer (HCC)    LEFT   Crohn disease (HCC)    Depression    GERD (gastroesophageal reflux disease)    History of methicillin resistant staphylococcus aureus (MRSA)    pt denies this.   History of radiation therapy    Left Breast- 05/09/22-06/11/22-Dr. Lynwood Nasuti   Multiple thyroid  nodules    Pneumonia    Port-A-Cath in place 03/25/2022   Sleep apnea    CPAP   Thyroid  cancer [facility])     Past Surgical History:  Procedure Laterality Date   ANAL FISSURE REPAIR  2004   BREAST LUMPECTOMY WITH RADIOACTIVE SEED LOCALIZATION Left 01/01/2022   Procedure: LEFT BREAST SEED LUMPECTOMY;  Surgeon: [name], MD;  Location: [facility] OR;  Service: General;  Laterality: Left;   CESAREAN SECTION     COLONOSCOPY     INSERTION OF MESH N/A 07/26/2020   Procedure: INSERTION OF MESH;  Surgeon: [name], MD;  Location: [facility] OR;  Service: General;  Laterality: N/A;   LIGAMENT REPAIR Right 11/23/2015   Procedure: right index radial collateral LIGAMENT REPAIR;  Surgeon: [name], MD;  Location: [facility];  Service: Orthopedics;  Laterality: Right;   PORTACATH PLACEMENT Right 02/07/2022   Procedure: INSERTION PORT-A-CATH;   Surgeon: [name], MD;  Location: [facility] OR;  Service: General;  Laterality: Right;   RE-EXCISION OF BREAST LUMPECTOMY Left 01/22/2022   Procedure: RE-EXCISION OF LEFT BREAST LUMPECTOMY;  Surgeon: [name], MD;  Location: [facility];  Service: General;  Laterality: Left;   SENTINEL NODE BIOPSY Left 01/22/2022   Procedure: LEFT SENTINEL NODE BIOPSY;  Surgeon: [name], MD;  Location: [facility];  Service: General;  Laterality: Left;   THYROIDECTOMY N/A 01/01/2022   Procedure: TOTAL THYROIDECTOMY WITH LIMITED LYMPH NODE DISSECTION;  Surgeon: [name], MD;  Location: [facility] OR;  Service: General;  Laterality: N/A;   UPPER ENDOSCOPY W/ ESOPHAGEAL MANOMETRY  10/2021   VENTRAL HERNIA REPAIR N/A 07/26/2020   Procedure: LAPAROSCOPIC VENTRAL HERNIA REPAIR WITH MESH;  Surgeon: [name], MD;  Location: [facility] OR;  Service: General;  Laterality: N/A;    Prior to Admission medications   Medication Sig Start Date End Date Taking? Authorizing Provider  adalimumab  (HUMIRA , 2 SYRINGE,) 40 MG/0.8ML prefilled syringe Inject 0.8 mLs (40 mg total) into the skin every 14 (fourteen) days. 11/14/23  Yes Jegede, Olugbemiga E, MD  anastrozole  (ARIMIDEX ) 1 MG tablet Take 1 tablet (1 mg total) by mouth daily. 05/29/23  Yes Gudena, Vinay, MD  ascorbic acid (VITAMIN C) 500 MG tablet Take 500 mg by mouth 2 (two) times a week.   Yes [provider]  bimatoprost  (LUMIGAN ) 0.01 % SOLN Instill 1 drop into both eyes at bedtime 08/25/23  Yes   busPIRone  (BUSPAR ) 7.5 MG tablet Take 1 tablet (7.5 mg total) by mouth 2 (two) times daily. 06/25/22  Yes [name], Ronal PARAS, MD  celecoxib  (CELEBREX ) 100 MG capsule Take 1 capsule (100 mg total) by mouth in the morning and at bedtime. 11/05/22  Yes [name], Ronal PARAS, MD  cetirizine (ZYRTEC) 10 MG tablet Take 10 mg by mouth in the morning.   Yes [provider]  clindamycin  (CLEOCIN  T) 1 % lotion Apply 1 Application topically 2 (two)  times daily. 09/25/23  Yes   DULoxetine  (CYMBALTA ) 60 MG capsule Take 1 capsule (60 mg total) by mouth daily. 03/18/23  Yes   ezetimibe  (ZETIA ) 10 MG tablet Take 1 tablet (10 mg total) by mouth daily. 09/01/23  Yes BaxleyRonal PARAS, MD  levothyroxine  (SYNTHROID ) 50 MCG tablet Take 2 tablets (100 mcg total) by mouth daily before breakfast. 04/14/23  Yes Trixie File, MD  Magnesium 250 MG TABS Take by mouth.   Yes [provider]  omeprazole  (PRILOSEC) 40 MG capsule Take 1 capsule (40 mg total) by mouth daily before supper. 10/22/23  Yes Valecia Beske, Elspeth SQUIBB, MD  albuterol  (VENTOLIN  HFA) 108 (90 Base) MCG/ACT inhaler Inhale 2 puffs into the lungs every 6 (six) hours as needed for wheezing or shortness of breath. 08/30/22   Perri Ronal PARAS, MD  clotrimazole -betamethasone  (LOTRISONE ) cream Apply 1 Application topically as needed. 12/29/15   [provider]  fluocinonide ointment (LIDEX) 0.05 % Apply 1 application  topically daily as needed (irritation).    [provider]  mupirocin  ointment (BACTROBAN ) 2 % Apply 1 Application topically as needed. 12/05/20   [provider]  polyethylene glycol (MIRALAX ) 17 g packet Take 17 g by mouth daily. And titrate as needed Patient not taking: No sig reported 07/29/23   Natalynn Pedone, Elspeth SQUIBB, MD    Current Outpatient Medications  Medication Sig Dispense Refill   adalimumab  (HUMIRA , 2 SYRINGE,) 40 MG/0.8ML prefilled syringe Inject 0.8 mLs (40 mg total) into the skin every 14 (fourteen) days. 2 each 11   anastrozole  (ARIMIDEX ) 1 MG tablet Take 1 tablet (1 mg total) by mouth daily. 90 tablet 3   ascorbic acid (VITAMIN C) 500 MG tablet Take 500 mg by mouth 2 (two) times a week.     bimatoprost  (LUMIGAN ) 0.01 % SOLN Instill 1 drop into both eyes at bedtime 2.5 mL 11   busPIRone  (BUSPAR ) 7.5 MG tablet Take 1 tablet (7.5 mg total) by mouth 2 (two) times daily. 180 tablet 2   celecoxib  (CELEBREX ) 100 MG capsule Take 1 capsule (100 mg  total) by mouth in the morning and at bedtime. 60 capsule 11   cetirizine (ZYRTEC) 10 MG tablet Take 10 mg by mouth in the morning.     clindamycin  (CLEOCIN  T) 1 % lotion Apply 1 Application topically 2 (two) times daily. 60 mL 3   DULoxetine  (CYMBALTA ) 60 MG capsule Take 1 capsule (60 mg total) by mouth daily. 90 capsule 3   ezetimibe  (ZETIA ) 10 MG tablet Take 1 tablet (10 mg total) by mouth daily. 90 tablet 3   levothyroxine  (SYNTHROID ) 50 MCG tablet Take 2 tablets (100 mcg total) by mouth daily before breakfast. 180 tablet 3   Magnesium 250 MG TABS Take by mouth.     omeprazole  (PRILOSEC) 40 MG capsule Take 1 capsule (40 mg total) by mouth daily before supper. 30  capsule 2   albuterol  (VENTOLIN  HFA) 108 (90 Base) MCG/ACT inhaler Inhale 2 puffs into the lungs every 6 (six) hours as needed for wheezing or shortness of breath. 6.7 g 3   clotrimazole -betamethasone  (LOTRISONE ) cream Apply 1 Application topically as needed.     fluocinonide ointment (LIDEX) 0.05 % Apply 1 application  topically daily as needed (irritation).     mupirocin  ointment (BACTROBAN ) 2 % Apply 1 Application topically as needed.     polyethylene glycol (MIRALAX ) 17 g packet Take 17 g by mouth daily. And titrate as needed (Patient not taking: No sig reported)     Current Facility-Administered Medications  Medication Dose Route Frequency Provider Last Rate Last Admin   0.9 %  sodium chloride  infusion  500 mL Intravenous Once Kamdon Reisig, Elspeth SQUIBB, MD        Allergies as of 11/18/2023 - Review Complete 11/18/2023  Allergen Reaction Noted   Gabapentin Itching 04/16/2021   Hydrocodone  Itching 10/24/2015   Oxycodone  hcl Itching 02/28/2017    Family History  Problem Relation Age of Onset   Alcohol abuse Mother    Alcohol abuse Sister    Angioedema Neg Hx    Allergic rhinitis Neg Hx    Asthma Neg Hx    Atopy Neg Hx    Eczema Neg Hx    Immunodeficiency Neg Hx    Urticaria Neg Hx    Colon cancer Neg Hx    Stomach  cancer Neg Hx    Esophageal cancer Neg Hx    Colon polyps Neg Hx     Social History   Socioeconomic History   Marital status: Married    Spouse name: Not on file   Number of children: 2   Years of education: Not on file   Highest education level: Not on file  Occupational History   Occupation: Self employed  Tobacco Use   Smoking status: Never    Passive exposure: Never   Smokeless tobacco: Never  Vaping Use   Vaping status: Never Used  Substance and Sexual Activity   Alcohol use: Yes    Alcohol/week: 10.0 standard drinks of alcohol    Types: 10 Glasses of wine per week    Comment: 10 glasses/week   Drug use: No   Sexual activity: Yes    Birth control/protection: Post-menopausal  Other Topics Concern   Not on file  Social History Narrative   Not on file   Social Drivers of Health   Financial Resource Strain: Low Risk  (09/17/2022)   Overall Financial Resource Strain (CARDIA)    Difficulty of Paying Living Expenses: Not hard at all  Food Insecurity: No Food Insecurity (09/17/2022)   Hunger Vital Sign    Worried About Running Out of Food in the Last Year: Never true    Ran Out of Food in the Last Year: Never true  Transportation Needs: No Transportation Needs (09/17/2022)   PRAPARE - Administrator, Civil Service (Medical): No    Lack of Transportation (Non-Medical): No  Physical Activity: Sufficiently Active (09/17/2022)   Exercise Vital Sign    Days of Exercise per Week: 5 days    Minutes of Exercise per Session: 60 min  Stress: Stress Concern Present (09/17/2022)   [employer] of Occupational Health - Occupational Stress Questionnaire    Feeling of Stress : To some extent  Social Connections: Socially Integrated (09/17/2022)   Social Connection and Isolation Panel    Frequency of Communication with Friends and Family: Three  times a week    Frequency of Social Gatherings with Friends and Family: Once a week    Attends Religious Services: More than 4  times per year    Active Member of [employer] or Organizations: Yes    Attends Engineer, Structural: More than 4 times per year    Marital Status: Married  Catering Manager Violence: Not on file    Review of Systems: All other review of systems negative except as mentioned in the HPI.  Physical Exam: Vital signs BP 139/79   Pulse 70   Temp 98.2 F (36.8 C) (Temporal)   Ht 5' 2 (1.575 m)   Wt 140 lb (63.5 kg)   LMP 01/10/2014   SpO2 98%   BMI 25.61 kg/m   General:   Alert,  Well-developed, pleasant and cooperative in NAD Lungs:  Clear throughout to auscultation.   Heart:  Regular rate and rhythm Abdomen:  Soft, nontender and nondistended.   Neuro/Psych:  Alert and cooperative. Normal mood and affect. A and O x 3  Marcey Naval, MD [facility]

## 2023-11-18 NOTE — Progress Notes (Signed)
 Vss nad trans to pacu

## 2023-11-18 NOTE — Telephone Encounter (Signed)
-----   Message from Elspeth SHAUNNA Naval sent at 11/18/2023  4:45 PM EST ----- Regarding: Humira  paperwork This patient asked me to sign off on some paperwork for her Humira  she states she submitted to our office a few weeks ago, but I haven't seen it yet. Anyway you can clarify with the prior auth team for her Humira  what exactly she needs? Have you seen any paperwork I need to sign off on regarding this? Thanks

## 2023-11-18 NOTE — Progress Notes (Signed)
 Pt's states no medical or surgical changes since previsit or office visit.

## 2023-11-18 NOTE — Patient Instructions (Addendum)
 POST DILATION DIET TODAY THEN TOMORROW, Resume previous diet Continue present medications Await pathology results  Handouts/information given for Hiatal Hernia, esophageal stricture, post dilation diet  YOU HAD AN ENDOSCOPIC PROCEDURE TODAY AT THE [facility]:   Refer to the procedure report that was given to you for any specific questions about what was found during the examination.  If the procedure report does not answer your questions, please call your gastroenterologist to clarify.  If you requested that your care partner not be given the details of your procedure findings, then the procedure report has been included in a sealed envelope for you to review at your convenience later.  YOU SHOULD EXPECT: Some feelings of bloating in the abdomen. Passage of more gas than usual.  Walking can help get rid of the air that was put into your GI tract during the procedure and reduce the bloating. If you had a lower endoscopy (such as a colonoscopy or flexible sigmoidoscopy) you may notice spotting of blood in your stool or on the toilet paper. If you underwent a bowel prep for your procedure, you may not have a normal bowel movement for a few days.  Please Note:  You might notice some irritation and congestion in your nose or some drainage.  This is from the oxygen used during your procedure.  There is no need for concern and it should clear up in a day or so.  SYMPTOMS TO REPORT IMMEDIATELY:  Following upper endoscopy (EGD)  Vomiting of blood or coffee ground material  New chest pain or pain under the shoulder blades  Painful or persistently difficult swallowing  New shortness of breath  Fever of 100F or higher  Black, tarry-looking stools For urgent or emergent issues, a gastroenterologist can be reached at any hour by calling [phone]. Do not use MyChart messaging for urgent concerns.   DIET:  SEE POST DILATION DIET HANDOUT!! We do recommend a small meal at first, but then  you may proceed to your regular diet.  Drink plenty of fluids but you should avoid alcoholic beverages for 24 hours.  ACTIVITY:  You should plan to take it easy for the rest of today and you should NOT DRIVE or use heavy machinery until tomorrow (because of the sedation medicines used during the test).    FOLLOW UP: Our staff will call the number listed on your records the next business day following your procedure.  We will call around 7:15- 8:00 am to check on you and address any questions or concerns that you may have regarding the information given to you following your procedure. If we do not reach you, we will leave a message.     If any biopsies were taken you will be contacted by phone or by letter within the next 1-3 weeks.  Please call us  at [phone] if you have not heard about the biopsies in 3 weeks.   SIGNATURES/CONFIDENTIALITY: You and/or your care partner have signed paperwork which will be entered into your electronic medical record.  These signatures attest to the fact that that the information above on your After Visit Summary has been reviewed and is understood.  Full responsibility of the confidentiality of this discharge information lies with you and/or your care-partner.

## 2023-11-18 NOTE — Op Note (Signed)
 [facility] Patient Name: [name] Procedure Date: 11/18/2023 10:30 AM MRN: [id number] Endoscopist: Elspeth P. Leigh , MD, [id number] Age: 63 Referring MD:  Date of Birth: [birth year] Gender: Female Account #: [id number] Procedure:                Upper GI endoscopy Indications:              Dysphagia, Follow-up of eosinophilic esophagitis -                            history of GEJ stricture, on omeprazole  40mg  / day Medicines:                Monitored Anesthesia Care Procedure:                Pre-Anesthesia Assessment:                           - Prior to the procedure, a History and Physical                            was performed, and patient medications and                            allergies were reviewed. The patient's tolerance of                            previous anesthesia was also reviewed. The risks                            and benefits of the procedure and the sedation                            options and risks were discussed with the patient.                            All questions were answered, and informed consent                            was obtained. Prior Anticoagulants: The patient has                            taken no anticoagulant or antiplatelet agents. ASA                            Grade Assessment: II - A patient with mild systemic                            disease. After reviewing the risks and benefits,                            the patient was deemed in satisfactory condition to                            undergo the procedure.  After obtaining informed consent, the endoscope was                            passed under direct vision. Throughout the                            procedure, the patient's blood pressure, pulse, and                            oxygen saturations were monitored continuously. The                            GIF [id number] #[id number] was introduced through the                             mouth, and advanced to the second part of duodenum.                            The upper GI endoscopy was accomplished without                            difficulty. The patient tolerated the procedure                            well. Scope In: Scope Out: Findings:                 Esophagogastric landmarks were identified: the                            Z-line was found at 36 cm, the gastroesophageal                            junction was found at 36 cm and the upper extent of                            the gastric folds was found at 37 cm from the                            incisors.                           A 1 cm hiatal hernia was present.                           One benign-appearing, intrinsic mild stenosis was                            found at the GEJ. This stenosis measured less than                            one cm (in length). A TTS dilator was passed  through the scope. Dilation with a 15-16.5-18 mm                            balloon dilator was performed to 15 mm, 16.5 mm and                            18 mm. Biopsy forceps were used to open it further                            post dilation.                           The exam of the esophagus was otherwise normal.                           Biopsies were obtained from the proximal and distal                            esophagus with cold forceps for histology for                            eosinophilic esophagitis - assess disease activity.                           The entire examined stomach was normal.                           The examined duodenum was normal. Complications:            No immediate complications. Estimated blood loss:                            Minimal. Estimated Blood Loss:     Estimated blood loss was minimal. Impression:               - Esophagogastric landmarks identified.                           - 1 cm hiatal hernia.                           - Benign-appearing  esophageal stenosis. Dilated to                            18mm.                           - Normal esophagus otherwise - no active                            inflammation, biopsies taken to assess for disease                            activity of EoE.                           -  Normal stomach.                           - Normal examined duodenum. Recommendation:           - Patient has a contact number available for                            emergencies. The signs and symptoms of potential                            delayed complications were discussed with the                            patient. Return to normal activities tomorrow.                            Written discharge instructions were provided to the                            patient.                           - Post dilation diet                           - Continue present medications.                           - Await pathology results. Elspeth P. Stacey Fullam, MD 11/18/2023 10:55:18 AM This report has been signed electronically.

## 2023-11-19 ENCOUNTER — Other Ambulatory Visit: Payer: Self-pay

## 2023-11-19 ENCOUNTER — Other Ambulatory Visit

## 2023-11-19 ENCOUNTER — Other Ambulatory Visit (HOSPITAL_COMMUNITY): Payer: Self-pay

## 2023-11-19 ENCOUNTER — Telehealth: Payer: Self-pay

## 2023-11-19 DIAGNOSIS — Z006 Encounter for examination for normal comparison and control in clinical research program: Secondary | ICD-10-CM

## 2023-11-19 NOTE — Telephone Encounter (Signed)
  Follow up Call-     11/18/2023    9:11 AM 12/25/2022    1:44 PM 11/09/2021    8:20 AM  Call back number  Post procedure Call Back phone  # [phone]  Permission to leave phone message Yes Yes Yes     Patient questions:  Do you have a fever, pain , or abdominal swelling? No. Pain Score  0 *  Have you tolerated food without any problems? Yes.    Have you been able to return to your normal activities? Yes.    Do you have any questions about your discharge instructions: Diet   No. Medications  No. Follow up visit  No.  Do you have questions or concerns about your Care? No.  Actions: * If pain score is 4 or above: No action needed, pain <4.

## 2023-11-19 NOTE — Telephone Encounter (Signed)
 I do not have record of anything being sent to us  to look at. And no prior authorization requests were received either.

## 2023-11-20 ENCOUNTER — Encounter: Payer: Self-pay | Admitting: *Deleted

## 2023-11-20 ENCOUNTER — Encounter (INDEPENDENT_AMBULATORY_CARE_PROVIDER_SITE_OTHER): Payer: Self-pay

## 2023-11-20 ENCOUNTER — Other Ambulatory Visit (HOSPITAL_COMMUNITY): Payer: Self-pay

## 2023-11-20 ENCOUNTER — Other Ambulatory Visit: Payer: Self-pay

## 2023-11-20 LAB — SURGICAL PATHOLOGY

## 2023-11-20 NOTE — Progress Notes (Signed)
 Specialty Pharmacy Refill Coordination Note  [name] is a 63 y.o. female contacted today regarding refills of specialty medication(s) Adalimumab  (Humira  (2 Syringe))   Patient requested (Patient-Rptd) Pickup at [facility] at [facility] date: (Patient-Rptd) 11/20/23   Medication will be filled on: 11/20/2023

## 2023-11-21 ENCOUNTER — Encounter: Payer: Self-pay | Admitting: Hematology and Oncology

## 2023-11-21 ENCOUNTER — Ambulatory Visit: Payer: Self-pay | Admitting: Gastroenterology

## 2023-11-25 ENCOUNTER — Other Ambulatory Visit (HOSPITAL_COMMUNITY): Payer: Self-pay

## 2023-11-25 ENCOUNTER — Ambulatory Visit (HOSPITAL_BASED_OUTPATIENT_CLINIC_OR_DEPARTMENT_OTHER)
Admission: RE | Admit: 2023-11-25 | Discharge: 2023-11-25 | Disposition: A | Discharge status: Home / Self Care | Source: Ambulatory Visit | Attending: Acute Care | Admitting: Acute Care

## 2023-11-25 DIAGNOSIS — R911 Solitary pulmonary nodule: Secondary | ICD-10-CM | POA: Diagnosis not present

## 2023-11-25 NOTE — Telephone Encounter (Signed)
 He had sent a message stating he had uploaded an attachment but we could not see it. If she got script filled all must be taken care of. Thank you.

## 2023-11-25 NOTE — Telephone Encounter (Signed)
 He is part of our call center team. Can I help in any way? Patient filled her script 11-14-2023 and can fill again on 12-05-2023

## 2023-11-28 ENCOUNTER — Other Ambulatory Visit: Payer: Self-pay | Admitting: Internal Medicine

## 2023-11-28 ENCOUNTER — Other Ambulatory Visit (HOSPITAL_COMMUNITY): Payer: Self-pay

## 2023-11-28 MED ORDER — BUSPIRONE HCL 7.5 MG PO TABS
7.5000 mg | ORAL_TABLET | Freq: Two times a day (BID) | ORAL | 2 refills | Status: AC
  Filled 2023-11-28 – 2024-02-05 (×2): qty 180, 90d supply, fill #0

## 2023-11-28 NOTE — Telephone Encounter (Signed)
 Copied from CRM #[id number]. Topic: Clinical - Medication Refill >> Nov 28, 2023 10:28 AM Cynthia K wrote: Medication:  busPIRone  (BUSPAR ) 7.5 MG tablet  Has the patient contacted their pharmacy? Yes (Agent: If no, request that the patient contact the pharmacy for the refill. If patient does not wish to contact the pharmacy document the reason why and proceed with request.) (Agent: If yes, when and what did the pharmacy advise?) Pharmacy needs order to refill  This is the patient's preferred pharmacy:  [facility] [address], [address] [place] [address] Phone: [phone] Fax: [phone]  Is this the correct pharmacy for this prescription? Yes If no, delete pharmacy and type the correct one.   Has the prescription been filled recently? No  Is the patient out of the medication? Yes  Has the patient been seen for an appointment in the last year OR does the patient have an upcoming appointment? Yes  Can we respond through MyChart? Yes  Agent: Please be advised that Rx refills may take up to 3 business days. We ask that you follow-up with your pharmacy.

## 2023-12-01 ENCOUNTER — Other Ambulatory Visit

## 2023-12-01 DIAGNOSIS — E7800 Pure hypercholesterolemia, unspecified: Secondary | ICD-10-CM

## 2023-12-01 DIAGNOSIS — Z0000 Encounter for general adult medical examination without abnormal findings: Secondary | ICD-10-CM

## 2023-12-01 DIAGNOSIS — C50912 Malignant neoplasm of unspecified site of left female breast: Secondary | ICD-10-CM

## 2023-12-01 DIAGNOSIS — M7918 Myalgia, other site: Secondary | ICD-10-CM

## 2023-12-01 DIAGNOSIS — Z1329 Encounter for screening for other suspected endocrine disorder: Secondary | ICD-10-CM

## 2023-12-01 LAB — GENECONNECT MOLECULAR SCREEN: Genetic Analysis Overall Interpretation: NEGATIVE

## 2023-12-01 NOTE — Progress Notes (Signed)
 Lab only

## 2023-12-02 ENCOUNTER — Ambulatory Visit (INDEPENDENT_AMBULATORY_CARE_PROVIDER_SITE_OTHER): Admitting: Internal Medicine

## 2023-12-02 ENCOUNTER — Encounter: Payer: Self-pay | Admitting: Internal Medicine

## 2023-12-02 VITALS — BP 120/80 | HR 84 | Ht 62.0 in | Wt 144.0 lb

## 2023-12-02 DIAGNOSIS — Z0000 Encounter for general adult medical examination without abnormal findings: Secondary | ICD-10-CM

## 2023-12-02 DIAGNOSIS — Z853 Personal history of malignant neoplasm of breast: Secondary | ICD-10-CM

## 2023-12-02 DIAGNOSIS — F419 Anxiety disorder, unspecified: Secondary | ICD-10-CM

## 2023-12-02 DIAGNOSIS — E039 Hypothyroidism, unspecified: Secondary | ICD-10-CM | POA: Diagnosis not present

## 2023-12-02 DIAGNOSIS — F439 Reaction to severe stress, unspecified: Secondary | ICD-10-CM | POA: Diagnosis not present

## 2023-12-02 DIAGNOSIS — K219 Gastro-esophageal reflux disease without esophagitis: Secondary | ICD-10-CM

## 2023-12-02 DIAGNOSIS — G8929 Other chronic pain: Secondary | ICD-10-CM

## 2023-12-02 DIAGNOSIS — E785 Hyperlipidemia, unspecified: Secondary | ICD-10-CM | POA: Diagnosis not present

## 2023-12-02 DIAGNOSIS — G4730 Sleep apnea, unspecified: Secondary | ICD-10-CM

## 2023-12-02 DIAGNOSIS — J309 Allergic rhinitis, unspecified: Secondary | ICD-10-CM

## 2023-12-02 DIAGNOSIS — Z85850 Personal history of malignant neoplasm of thyroid: Secondary | ICD-10-CM

## 2023-12-02 DIAGNOSIS — Z8719 Personal history of other diseases of the digestive system: Secondary | ICD-10-CM

## 2023-12-02 DIAGNOSIS — J4520 Mild intermittent asthma, uncomplicated: Secondary | ICD-10-CM | POA: Diagnosis not present

## 2023-12-02 DIAGNOSIS — Z8669 Personal history of other diseases of the nervous system and sense organs: Secondary | ICD-10-CM

## 2023-12-02 DIAGNOSIS — Z79620 Long term (current) use of immunosuppressive biologic: Secondary | ICD-10-CM

## 2023-12-02 DIAGNOSIS — K200 Eosinophilic esophagitis: Secondary | ICD-10-CM

## 2023-12-02 DIAGNOSIS — E7800 Pure hypercholesterolemia, unspecified: Secondary | ICD-10-CM

## 2023-12-02 LAB — CBC WITH DIFFERENTIAL/PLATELET
Absolute Lymphocytes: 2192 {cells}/uL (ref 850–3900)
Absolute Monocytes: 557 {cells}/uL (ref 200–950)
Basophils Absolute: 81 {cells}/uL (ref 0–200)
Basophils Relative: 1.4 %
Eosinophils Absolute: 191 {cells}/uL (ref 15–500)
Eosinophils Relative: 3.3 %
HCT: 40.1 % (ref 35.0–45.0)
Hemoglobin: 13.3 g/dL (ref 11.7–15.5)
MCH: 30.9 pg (ref 27.0–33.0)
MCHC: 33.2 g/dL (ref 32.0–36.0)
MCV: 93.3 fL (ref 80.0–100.0)
MPV: 9.7 fL (ref 7.5–12.5)
Monocytes Relative: 9.6 %
Neutro Abs: 2778 {cells}/uL (ref 1500–7800)
Neutrophils Relative %: 47.9 %
Platelets: 289 Thousand/uL (ref 140–400)
RBC: 4.3 Million/uL (ref 3.80–5.10)
RDW: 12.6 % (ref 11.0–15.0)
Total Lymphocyte: 37.8 %
WBC: 5.8 Thousand/uL (ref 3.8–10.8)

## 2023-12-02 LAB — COMPREHENSIVE METABOLIC PANEL WITH GFR
AG Ratio: 1.7 (calc) (ref 1.0–2.5)
ALT: 24 U/L (ref 6–29)
AST: 25 U/L (ref 10–35)
Albumin: 4.8 g/dL (ref 3.6–5.1)
Alkaline phosphatase (APISO): 79 U/L (ref 37–153)
BUN: 19 mg/dL (ref 7–25)
CO2: 27 mmol/L (ref 20–32)
Calcium: 9.6 mg/dL (ref 8.6–10.4)
Chloride: 101 mmol/L (ref 98–110)
Creat: 0.64 mg/dL (ref 0.50–1.05)
Globulin: 2.9 g/dL (ref 1.9–3.7)
Glucose, Bld: 90 mg/dL (ref 65–99)
Potassium: 4.4 mmol/L (ref 3.5–5.3)
Sodium: 137 mmol/L (ref 135–146)
Total Bilirubin: 0.5 mg/dL (ref 0.2–1.2)
Total Protein: 7.7 g/dL (ref 6.1–8.1)
eGFR: 99 mL/min/1.73m2 (ref >=60)

## 2023-12-02 LAB — POCT URINALYSIS DIP (CLINITEK)
Bilirubin, UA: NEGATIVE
Blood, UA: NEGATIVE
Glucose, UA: NEGATIVE mg/dL
Ketones, POC UA: NEGATIVE mg/dL
Leukocytes, UA: NEGATIVE
Nitrite, UA: NEGATIVE
POC PROTEIN,UA: NEGATIVE
Spec Grav, UA: 1.015 (ref 1.010–1.025)
Urobilinogen, UA: 0.2 U/dL
pH, UA: 6.5 (ref 5.0–8.0)

## 2023-12-02 LAB — LIPID PANEL
Cholesterol: 266 mg/dL — ABNORMAL HIGH (ref <200)
HDL: 119 mg/dL (ref >=50)
LDL Cholesterol (Calc): 131 mg/dL — ABNORMAL HIGH
Non-HDL Cholesterol (Calc): 147 mg/dL — ABNORMAL HIGH (ref <130)
Total CHOL/HDL Ratio: 2.2 (calc) (ref <5.0)
Triglycerides: 69 mg/dL (ref <150)

## 2023-12-02 LAB — TSH: TSH: 1.94 m[IU]/L (ref 0.40–4.50)

## 2023-12-02 NOTE — Progress Notes (Signed)
 Annual Comprehensive Physical Exam  Patient Care Team: Berlyn Malina, Ronal PARAS, MD as PCP - General (Internal Medicine) Odean Potts, MD as Consulting Physician (Hematology and Oncology) Shannon Agent, MD as Consulting Physician (Radiation Oncology) [name], MD as Consulting Physician (General Surgery)  Visit Date: 12/02/23   Chief Complaint  Patient presents with   Annual Exam   Subjective:  Patient: [name], Female DOB: [birth year], 63 y.o. MRN: [id number] Vitals:   12/02/23 1504  BP: 120/80   [name] is a 63 y.o. Female who presents today for her Annual Comprehensive Physical Exam. Patient has Crohn's disease (HCC); Dry skin dermatitis; Glaucoma; Depression; OSA (obstructive sleep apnea); Crohn's disease of colon (HCC); Sinobronchitis; Allergic rhinoconjunctivitis; Gastroesophageal reflux disease; Cough variant asthma; Noncompliance with medication treatment due to intermittent use of medication; Disorder of intestine; Anorectal polyp; Peroneal tendinitis of left lower extremity; Polyarthralgia; Patellofemoral syndrome of right knee; Piriformis syndrome of right side; Nonallopathic lesion of sacral region; Nonallopathic lesion of lumbosacral region; Nonallopathic lesion of thoracic region; Sprain of iliolumbar ligament; Loss of transverse plantar arch; Contusion of right hip and thigh; Lumbar radiculopathy; Umbilical hernia; Tibialis posterior tendinitis, right; Piriformis syndrome of left side; Acromioclavicular sprain, left, initial encounter; Pulmonary nodule; Left upper lobe pulmonary nodule; Multiple thyroid  nodules; Papillary thyroid  carcinoma (HCC); Postsurgical hypothyroidism; Malignant neoplasm of upper-inner quadrant of left breast in female, estrogen receptor positive (HCC); Ganglion cyst of finger of right hand; Arthritis of carpometacarpal [facility]) joint of right thumb; Carpal tunnel syndrome; AC (acromioclavicular) arthritis; and Traumatic arthropathy of  metacarpophalangeal (MCP) joint on their problem list.  History of Hyperlipidemia treated with Zetia  10 mg. 12/01/2023 Lipid Panel CHOL 266, LDL 131, Otherwise WNL. Has previously tried rosuvastatin  but discontinued due to myalgias. 10/15/2021 Coronary Calcium  score 0.  History of Stage 1A left breast invasive ductal carcinoma, ER+/PR+/HER2-, diagnosed in 01/2022, treated with lumpectomy, adjuvant chemotherapy, adjuvant radiation therapy, and anti-estrogen therapy with Anastrozole  beginning in 06/2022. She is currently taking anastrozole  1 mg daily. Followed by oncologist, Dr. Gudena.   History of anxiety and depression treated with buspirone  7.5 mg twice daily.   History of hypothyroidism treated with levothyroxine  100 mcg before breakfast.   History of GE Reflux treated with omeprazole  20 mg daily.   History of asthma treated with albuterol  inhaler as needed.   Seen by Dr. Zack Smith for musculoskeletal pain.  In March 2023 had issues of back and neck pain and some right CMC joint pain after clipping some grass.  She is taking vitamin D  supplement.  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  Was treated with prednisone  in March 2023.  Has taken Zanaflex  in the past.  In July 2023 had left AC sprain he was given an injection with improvement.  Lumbar radiculopathy responded to osteopathic manipulation.  Was seen by Dr. Darlyn Sharps again in July 2023 after falling off of a bike 3 weeks previously with resultant back and neck pain.  Left AC joint injected.   History of allergic rhinitis and recurrent sinusitis.   Still followed at [facility] of Gastroenterology for Crohn's disease.  Still on Humira .  Is also on Cymbalta  which helps pain and Celebrex .  Currently taking high-dose vitamin D  supplement per Dr. Sharps.  Dr. Octavia has her on Lumigan  with history of open-angle glaucoma.  She is on Humira  40 mg injected every 14 days.   History of sleep apnea but not able to wear  CPAP device if significantly nasally congested.  Has seen Dr. Jude at [facility].   History of MRSA in the remote past.   GYN is Dr. Barbette at [facility].   Dr. [name] performed repair of right index collateral ligament November 2017.   In September 2017 she had abscess of right fourth finger requiring surgical drainage and culture grew Staph aureus sensitive to Bactrim    History of epidermoid cyst removed from her back February 2019 at [facility].   Biopsy of 2.2 cm right-mid thyroid  nodule completed 11/14/21. Findings consistent with papillary carcinoma.    Labs 12/01/2023 CHOL 266, LDL 131, Otherwise WNL.     11/25/2023 CT chest 1. 5 mm left basilar pulmonary nodule, unchanged/benign. No follow-up recommended.   12/09/2022 No Mammographic evidence of malignancy. Repeat in one year.    12/25/2022 Colonoscopy The examined portion of the ileum was normal. Diverticulosis in the left colon and in the right colon.One 10 mm polyp in the transverse colon. Resected and retrieved. Two diminutive polyps in the transverse colon. Resected and retrieved.  Three 4 to 8 mm polyps in the sigmoid colon. Resected and retrieved. Internal hemorrhoids. Pathology found to be tubular adenoma, sessile serrated polyp, Benign colonic mucosa. The examination was otherwise normal. No active inflammation. Repeat in 6 months.    Vaccine counseling: Influenza vaccine received today.  Health Maintenance  Topic Date Due   Cervical Cancer Screening (HPV/Pap Cotest)  Never done   Influenza Vaccine  08/15/2023   Pneumococcal Vaccine: 50+ Years (3 of 3 - PCV20 or PCV21) 10/27/2023   Mammogram  12/09/2023   Colonoscopy  12/25/2023   HIV Screening  12/01/2024 (Originally 07/31/1975)   DEXA SCAN  12/08/2024   DTaP/Tdap/Td (2 - Td or Tdap) 07/12/2027   Hepatitis B Vaccines 19-59 Average Risk  Completed   Hepatitis C Screening  Completed   Zoster Vaccines- Shingrix   Completed   HPV VACCINES   Aged Out   Meningococcal B Vaccine  Aged Out   COVID-19 Vaccine  Discontinued    Review of Systems  Constitutional:  Negative for fever and malaise/fatigue.  HENT:  Negative for congestion.   Eyes:  Negative for blurred vision.  Respiratory:  Negative for cough and shortness of breath.   Cardiovascular:  Negative for chest pain, palpitations and leg swelling.  Gastrointestinal:  Negative for vomiting.  Musculoskeletal:  Negative for back pain.  Skin:  Negative for rash.  Neurological:  Negative for loss of consciousness and headaches.   Objective:  Vitals: body mass index is 26.34 kg/m. Today's Vitals   12/02/23 1504  BP: 120/80  Pulse: 84  SpO2: 94%  Weight: 144 lb (65.3 kg)  Height: 5' 2 (1.575 m)  PainSc: 0-No pain   Physical Exam Vitals and nursing note reviewed.  Constitutional:      General: She is not in acute distress.    Appearance: Normal appearance. She is not ill-appearing or toxic-appearing.  HENT:     Head: Normocephalic and atraumatic.     Right Ear: Hearing, tympanic membrane, ear canal and external ear normal.     Left Ear: Hearing, tympanic membrane, ear canal and external ear normal.     Mouth/Throat:     Pharynx: Oropharynx is clear.  Eyes:     Extraocular Movements: Extraocular movements intact.     Pupils: Pupils are equal, round, and reactive to light.  Neck:     Thyroid : No thyroid  mass, thyromegaly or thyroid  tenderness.     Vascular: No carotid bruit.  Cardiovascular:  Rate and Rhythm: Normal rate and regular rhythm. No extrasystoles are present.    Pulses:          Dorsalis pedis pulses are 2+ on the right side and 2+ on the left side.     Heart sounds: Normal heart sounds. No murmur heard.    No friction rub. No gallop.  Pulmonary:     Effort: Pulmonary effort is normal.     Breath sounds: Normal breath sounds. No decreased breath sounds, wheezing, rhonchi or rales.  Chest:     Chest wall: No mass.  Abdominal:     Palpations:  Abdomen is soft. There is no hepatomegaly, splenomegaly or mass.     Tenderness: There is no abdominal tenderness.     Hernia: No hernia is present.  Genitourinary:    Comments: GYN deferred.  Musculoskeletal:     Cervical back: Normal range of motion.     Right lower leg: No edema.     Left lower leg: No edema.  Lymphadenopathy:     Cervical: No cervical adenopathy.     Upper Body:     Right upper body: No supraclavicular adenopathy.     Left upper body: No supraclavicular adenopathy.  Skin:    General: Skin is warm and dry.  Neurological:     General: No focal deficit present.     Mental Status: She is alert and oriented to person, place, and time. Mental status is at baseline.     Sensory: Sensation is intact.     Motor: Motor function is intact. No weakness.     Deep Tendon Reflexes: Reflexes are normal and symmetric.  Psychiatric:        Attention and Perception: Attention normal.        Mood and Affect: Mood normal.        Speech: Speech normal.        Behavior: Behavior normal.        Thought Content: Thought content normal.        Cognition and Memory: Cognition normal.        Judgment: Judgment normal.     Current Outpatient Medications  Medication Instructions   albuterol  (VENTOLIN  HFA) 108 (90 Base) MCG/ACT inhaler 2 puffs, Inhalation, Every 6 hours PRN   anastrozole  (ARIMIDEX ) 1 mg, Oral, Daily   ascorbic acid (VITAMIN C) 500 mg, 2 times weekly   bimatoprost  (LUMIGAN ) 0.01 % SOLN Instill 1 drop into both eyes at bedtime   busPIRone  (BUSPAR ) 7.5 mg, Oral, 2 times daily   celecoxib  (CELEBREX ) 100 mg, Oral, 2 times daily   cetirizine (ZYRTEC) 10 mg, Every morning   clindamycin  (CLEOCIN  T) 1 % lotion 1 Application, Topical, 2 times daily   clotrimazole -betamethasone  (LOTRISONE ) cream 1 Application, As needed   DULoxetine  (CYMBALTA ) 60 mg, Oral, Daily   ezetimibe  (ZETIA ) 10 mg, Oral, Daily   fluocinonide ointment (LIDEX) 0.05 % 1 application , Daily PRN   Humira   (2 Syringe) 40 mg, Subcutaneous, Every 14 days   levothyroxine  (SYNTHROID ) 100 mcg, Oral, Daily before breakfast   Magnesium 250 MG TABS Take by mouth.   mupirocin  ointment (BACTROBAN ) 2 % 1 Application, As needed   omeprazole  (PRILOSEC) 40 mg, Oral, Daily before supper   Past Medical History:  Diagnosis Date   Anxiety    Allergy  induced asthma   Arthritis    Asthma    Breast cancer (HCC)    LEFT   Crohn disease (HCC)    Depression  GERD (gastroesophageal reflux disease)    History of methicillin resistant staphylococcus aureus (MRSA)    pt denies this.   History of radiation therapy    Left Breast- 05/09/22-06/11/22-Dr. Lynwood Nasuti   Multiple thyroid  nodules    Pneumonia    Port-A-Cath in place 03/25/2022   Sleep apnea    CPAP   Thyroid  cancer [facility])    Medical/Surgical History Narrative:  Allergic/Intolerant to:  Allergies  Allergen Reactions   Gabapentin Itching   Hydrocodone  Itching   Oxycodone  Hcl Itching    NDC Rniz:[id number]  NDC Rniz:[id number]    Past Surgical History:  Procedure Laterality Date   ANAL FISSURE REPAIR  2004   BREAST LUMPECTOMY WITH RADIOACTIVE SEED LOCALIZATION Left 01/01/2022   Procedure: LEFT BREAST SEED LUMPECTOMY;  Surgeon: [name], MD;  Location: [facility] OR;  Service: General;  Laterality: Left;   CESAREAN SECTION     COLONOSCOPY     INSERTION OF MESH N/A 07/26/2020   Procedure: INSERTION OF MESH;  Surgeon: [name], MD;  Location[facility];  Service: General;  Laterality: N/A;   LIGAMENT REPAIR Right 11/23/2015   Procedure: right index radial collateral LIGAMENT REPAIR;  Surgeon: [name], MD;  Location: [facility];  Service: Orthopedics;  Laterality: Right;   PORTACATH PLACEMENT Right 02/07/2022   Procedure: INSERTION PORT-A-CATH;  Surgeon: [name], MD;  Location: [facility] OR;  Service: General;  Laterality: Right;   RE-EXCISION OF BREAST LUMPECTOMY Left 01/22/2022   Procedure: RE-EXCISION OF LEFT BREAST  LUMPECTOMY;  Surgeon: [name], MD;  Location: [facility];  Service: General;  Laterality: Left;   SENTINEL NODE BIOPSY Left 01/22/2022   Procedure: LEFT SENTINEL NODE BIOPSY;  Surgeon: [name], MD;  Location: [facility];  Service: General;  Laterality: Left;   THYROIDECTOMY N/A 01/01/2022   Procedure: TOTAL THYROIDECTOMY WITH LIMITED LYMPH NODE DISSECTION;  Surgeon: [name], MD;  Location: [facility] OR;  Service: General;  Laterality: N/A;   UPPER ENDOSCOPY W/ ESOPHAGEAL MANOMETRY  10/2021   VENTRAL HERNIA REPAIR N/A 07/26/2020   Procedure: LAPAROSCOPIC VENTRAL HERNIA REPAIR WITH MESH;  Surgeon: [name], MD;  Location: [facility] OR;  Service: General;  Laterality: N/A;   Family History  Problem Relation Age of Onset   Alcohol abuse Mother    Alcohol abuse Sister    Angioedema Neg Hx    Allergic rhinitis Neg Hx    Asthma Neg Hx    Atopy Neg Hx    Eczema Neg Hx    Immunodeficiency Neg Hx    Urticaria Neg Hx    Colon cancer Neg Hx    Stomach cancer Neg Hx    Esophageal cancer Neg Hx    Colon polyps Neg Hx    Family History Narrative: Family history: Stroke and alcoholism in mother and sister.  Mother is deceased.   Social history: Married.  2 sons.  Does not smoke.  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  Husband is a cardiologist.  Her nephew resides with the family as well.    Most Recent Health Risks Assessment:   Most Recent Social Determinants of Health (Including Hx of Tobacco, Alcohol, and Drug Use) SDOH Screenings   Food Insecurity: No Food Insecurity (12/01/2023)  Housing: Low Risk  (12/01/2023)  Transportation Needs: No Transportation Needs (12/01/2023)  Utilities: Not At Risk (09/17/2022)  Alcohol Screen: Low Risk  (12/01/2023)  Depression (PHQ2-9): Medium Risk (12/02/2023)  Financial Resource Strain: Low Risk  (  12/01/2023)  Physical Activity: Sufficiently Active (12/01/2023)  Social Connections: Socially  Integrated (12/01/2023)  Stress: Stress Concern Present (12/01/2023)  Tobacco Use: Low Risk  (12/02/2023)  Health Literacy: Adequate Health Literacy (09/17/2022)   Social History   Tobacco Use   Smoking status: Never    Passive exposure: Never   Smokeless tobacco: Never  Vaping Use   Vaping status: Never Used  Substance Use Topics   Alcohol use: Yes    Alcohol/week: 10.0 standard drinks of alcohol    Types: 10 Glasses of wine per week    Comment: 10 glasses/week   Drug use: Never   Most Recent Fall Risk Assessment:    09/04/2021    2:12 PM  Fall Risk   Falls in the past year? 0  Number falls in past yr: 0  Injury with Fall? 0  Risk for fall due to : No Fall Risks  Follow up Falls evaluation completed      Data saved with a previous flowsheet row definition   Most Recent Anxiety/Depression Screenings:    12/02/2023    3:09 PM 04/29/2022    1:28 PM  PHQ 2/9 Scores  PHQ - 2 Score 3 0  PHQ- 9 Score 9   Exception Documentation  Medical reason      12/02/2023    3:10 PM  GAD 7 : Generalized Anxiety Score  Nervous, Anxious, on Edge 2  Control/stop worrying 2  Worry too much - different things 2  Trouble relaxing 1  Restless 0  Easily annoyed or irritable 2  Anxiety Difficulty Somewhat difficult    Results:  Studies Obtained And Personally Reviewed By Me:   11/25/2023 CT chest 1. 5 mm left basilar pulmonary nodule, unchanged/benign. No follow-up recommended.   12/09/2022 No Mammographic evidence of malignancy. Repeat in one year.    12/25/2022 Colonoscopy The examined portion of the ileum was normal. Diverticulosis in the left colon and in the right colon.One 10 mm polyp in the transverse colon. Resected and retrieved. Two diminutive polyps in the transverse colon. Resected and retrieved.  Three 4 to 8 mm polyps in the sigmoid colon. Resected and retrieved. Internal hemorrhoids. Pathology found to be tubular adenoma, sessile serrated polyp, Benign colonic mucosa.  The examination was otherwise normal. No active inflammation. Repeat in 6 months.    Labs:  CBC w/ Differential Lab Results  Component Value Date   WBC 5.8 12/01/2023   RBC 4.30 12/01/2023   HGB 13.3 12/01/2023   HCT 40.1 12/01/2023   PLT 289 12/01/2023   MCV 93.3 12/01/2023   MCH 30.9 12/01/2023   MCHC 33.2 12/01/2023   RDW 12.6 12/01/2023   MPV 9.7 12/01/2023   LYMPHSABS 1.9 07/29/2023   MONOABS 0.7 07/29/2023   BASOSABS 81 12/01/2023    Comprehensive Metabolic Panel Lab Results  Component Value Date   NA 137 12/01/2023   K 4.4 12/01/2023   CL 101 12/01/2023   CO2 27 12/01/2023   GLUCOSE 90 12/01/2023   BUN 19 12/01/2023   CREATININE 0.64 12/01/2023   CALCIUM  9.6 12/01/2023   PROT 7.7 12/01/2023   ALBUMIN 4.6 07/29/2023   AST 25 12/01/2023   ALT 24 12/01/2023   ALKPHOS 70 07/29/2023   BILITOT 0.5 12/01/2023   GFR 89.25 07/29/2023   EGFR 99 12/01/2023   GFRNONAA >60 04/15/2022   Lipid Panel  Lab Results  Component Value Date   CHOL 266 (H) 12/01/2023   HDL 119 12/01/2023   LDLCALC 131 (H) 12/01/2023  TRIG 69 12/01/2023   TSH Lab Results  Component Value Date   TSH 1.94 12/01/2023    Results for orders placed or performed in visit on 12/02/23  POCT URINALYSIS DIP (CLINITEK)  Result Value Ref Range   Color, UA yellow yellow   Clarity, UA clear clear   Glucose, UA negative negative mg/dL   Bilirubin, UA negative negative   Ketones, POC UA negative negative mg/dL   Spec Grav, UA 8.984 8.989 - 1.025   Blood, UA negative negative   pH, UA 6.5 5.0 - 8.0   POC PROTEIN,UA negative negative, trace   Urobilinogen, UA 0.2 0.2 or 1.0 E.U./dL   Nitrite, UA Negative Negative   Leukocytes, UA Negative Negative   Assessment & Plan:   Orders Placed This Encounter  Procedures   Ambulatory referral to Cardiology    Referral Priority:   Routine    Referral Type:   Consultation    Referral Reason:   Specialty Services Required    Number of Visits Requested:    1   POCT URINALYSIS DIP (CLINITEK)    Hyperlipidemia: treated with Zetia  10 mg. 12/01/2023 Lipid Panel CHOL 266, LDL 131, Otherwise WNL. Has previously tried rosuvastatin  but discontinued due to myalgias. 10/15/2021 Coronary Calcium  score 0.   Referred to Cardiology- Dr. Katharina lipid clinic for evaluation and consideration of other treatment for hyperlipidemia   Anxiety and depression: treated with buspirone  7.5 mg twice daily.   Hypothyroidism: treated with levothyroxine  100 mcg before breakfast.   GE Reflux: treated with omeprazole  20 mg daily.   Asthma treated with albuterol  inhaler as needed.  Situational stress with nephew discussed   History of allergic rhinitis and recurrent sinusitis.  Eosinophilic esophagitis- recent endoscopy by Dr. Leigh does not show active disease. Continue PPI. 6 month follow up requested by GI   Still followed at [facility] Department of G[facility]ology for Crohn's disease.  Still on Humira .  Is also on Cymbalta  which helps pain and Celebrex .  Currently taking high-dose vitamin D  supplement per [name].  Dr. Octavia has her on Lumigan  with history of open-angle glaucoma.  She is on Humira  40 mg injected every 14 days.   Sleep apnea: not able to wear CPAP device if significantly nasally congested.  Has seen Dr. Jude at [facility].   GYN is Dr. Barbette at [facility].   11/25/2023 CT chest 1. 5 mm left basilar pulmonary nodule, unchanged/benign. No follow-up recommended.   12/09/2022 No Mammographic evidence of malignancy. Repeat in one year.    12/25/2022 Colonoscopy The examined portion of the ileum was normal. Diverticulosis in the left colon and in the right colon.One 10 mm polyp in the transverse colon. Resected and retrieved. Two diminutive polyps in the transverse colon. Resected and retrieved.  Three 4 to 8 mm polyps in the sigmoid colon. Resected and retrieved. Internal hemorrhoids. Pathology found to be tubular adenoma, sessile serrated  polyp, Benign colonic mucosa. The examination was otherwise normal. No active inflammation. Repeat in 6 months.    Vaccine counseling: Influenza vaccine received today.    Annual comprehensive Physical Exam done today including the all of the following: Reviewed patient's Family Medical History Reviewed patient's SDOH and reviewed tobacco, alcohol, and drug use.  Reviewed and updated list of patient's medical providers Assessment of cognitive impairment was done Assessed patient's functional ability Established a written schedule for health screening services Health Risk Assessent Completed and Reviewed  Discussed health benefits of physical activity, and encouraged her to engage  in regular exercise appropriate for her age and condition.    I,Makayla C Reid,acting as a scribe for Ronal JINNY Hailstone, MD.,have documented all relevant documentation on the behalf of Ronal JINNY Hailstone, MD,as directed by  Ronal JINNY Hailstone, MD while in the presence of Ronal JINNY Hailstone, MD.  I, Ronal JINNY Hailstone, MD, have reviewed all documentation for and agree with the above Annual Wellness Visit documentation.  Ronal JINNY Hailstone, MD Internal Medicine 12/02/2023

## 2023-12-03 ENCOUNTER — Other Ambulatory Visit: Payer: Self-pay

## 2023-12-05 ENCOUNTER — Encounter: Admitting: Internal Medicine

## 2023-12-09 ENCOUNTER — Telehealth: Admitting: Acute Care

## 2023-12-09 ENCOUNTER — Other Ambulatory Visit (HOSPITAL_COMMUNITY): Payer: Self-pay

## 2023-12-09 ENCOUNTER — Encounter: Payer: Self-pay | Admitting: Acute Care

## 2023-12-09 VITALS — Ht 62.0 in | Wt 140.0 lb

## 2023-12-09 DIAGNOSIS — R918 Other nonspecific abnormal finding of lung field: Secondary | ICD-10-CM

## 2023-12-09 DIAGNOSIS — R911 Solitary pulmonary nodule: Secondary | ICD-10-CM

## 2023-12-09 DIAGNOSIS — Z853 Personal history of malignant neoplasm of breast: Secondary | ICD-10-CM | POA: Diagnosis not present

## 2023-12-09 DIAGNOSIS — R9389 Abnormal findings on diagnostic imaging of other specified body structures: Secondary | ICD-10-CM

## 2023-12-09 DIAGNOSIS — F4323 Adjustment disorder with mixed anxiety and depressed mood: Secondary | ICD-10-CM | POA: Diagnosis not present

## 2023-12-09 NOTE — Progress Notes (Signed)
 Virtual Visit via Video Note  I connected with [name] on 12/09/23 at 10:00 AM EST by a video enabled telemedicine application and verified that I am speaking with the correct person using two identifiers.  Location: Patient:  At home Provider: [address] [place], [place], [address]    I discussed the limitations of evaluation and management by telemedicine and the availability of in person appointments. The patient expressed understanding and agreed to proceed.  History of Present Illness: [name] is a 63 y.o. female with  past medical history of anxiety, arthritis, asthma, Crohn's disease, sleep apnea on CPAP, and breast cancer Lifelong non-smoker.Patient had a CT scan of the chest for cardiac scoring completed on 10/15/2021.  This revealed a part solid right lower lobe 2.7 cm diameter nodule with a 1.2 cm solid component suspicious morphology concerning for adenocarcinoma. Patient was referred 10/17/2021 for evaluation  of pulmonary nodule with Dr. Brenna. The nodule has been stable, she is currently under surveillance.  She is currently doing well. No pulmonary issues.  We have reviewed her Ct chest which shows a stable 5 mm left basilar lung nodule. This is great news. We discussed whether she wants to stop imaging surveillance or do an additional year. She would like to scan one additional year. This will be due 11/2024. We will do a follow up video visit 1-2 weeks after the scan . We will have her call to be seen sooner for any unintentional weight loss or blood in the sputum.      Observations/Objective: CT chest 11/25/2023 LUNGS AND PLEURA: 5 mm left basilar nodule (image 121), unchanged, benign. No follow-up is recommended per Fleischner Society guidelines. No focal consolidation or pulmonary edema. No pleural effusion or pneumothorax.   SOFT TISSUES/BONES: Status post thyroidectomy. Benign calcifications in the right breast, postprocedural changes in the  left breast. Mild degenerative changes of the mid thoracic spine.   UPPER ABDOMEN: Limited images of the upper abdomen demonstrates no acute abnormality.   IMPRESSION: 1. 5 mm left basilar pulmonary nodule, unchanged/benign. No follow-up recommended per Fleischner Society guidelines.    CT Chest 11/27/2022 Mediastinum/Nodes: Thyroidectomy clips. No pathologically enlarged mediastinal or axillary lymph nodes. Hilar regions are difficult to definitively evaluate without IV contrast. Surgical clips in the left axilla. Esophagus is grossly unremarkable.   Lungs/Pleura: Minimal scattered mucoid impaction. Subpleural radiation scarring in the anterior left upper lobe. Tiny nodules in the left lung measure up to 4 mm along the left hemidiaphragm, unchanged from 11/30/2021 and considered benign. No specific follow-up necessary than on routine imaging. No suspicious pulmonary nodules. No pleural fluid. Airway is unremarkable.   Upper Abdomen: Visualized portions of the liver, gallbladder, adrenal glands, kidneys, spleen, pancreas, stomach and bowel are grossly unremarkable. No upper abdominal adenopathy.   Musculoskeletal: Degenerative changes in the spine.  October 2023 coronary calcium  scoring CT: 2.7 mm groundglass subsolid lesion with a 1.2 cm solid component concerning for a adenocarcinoma of the lung.   IMPRESSION: No suspicious pulmonary nodules.   Breast Cancer Treatment plan: Adjuvant chemotherapy with Taxotere  and Cytoxan  every 3 weeks x 4 completed 04/15/2022 Adjuvant radiation therapy to be completed 06/07/2022 Adjuvant antiestrogen therapy with anastrozole  1 mg daily to start 06/15/2022   Post Chemo 4/24 Post radiation therapy 5/24    Assessment and Plan: Stable Pulmonary Nodules  History of breast cancer Plan The nodule we have been monitoring is stable. Per radiology, there is no follow up needed, but we will  follow for  an additional year out of caution. This will be  due 11/2024. You will get a call closer to the time to get this scheduled.  Follow up video visit with [name] 1-2 weeks after the scan to review results. Call if you need us  sooner.  Please contact office for sooner follow up if symptoms do not improve or worsen or seek emergency care   Happy Holidays.  Follow Up Instructions: Follow up video visit with [name] 1-2 weeks after the scan to review results. Call if you need us  sooner.  Please contact office for sooner follow up if symptoms do not improve or worsen or seek emergency care   Happy Holidays.   I discussed the assessment and treatment plan with the patient. The patient was provided an opportunity to ask questions and all were answered. The patient agreed with the plan and demonstrated an understanding of the instructions.   The patient was advised to call back or seek an in-person evaluation if the symptoms worsen or if the condition fails to improve as anticipated.  I spent 15 minutes dedicated to the care of this patient on the date of this encounter to include pre-visit review of records, video face-to-face time with the patient discussing conditions above, post visit ordering of testing, clinical documentation with the electronic health record, making appropriate referrals as documented, and communicating necessary information to the patient's healthcare team.   Lauraine JULIANNA Lites, NP

## 2023-12-09 NOTE — Patient Instructions (Addendum)
 It is good to see you today.  We have reviewed your CT chest. The nodule we have been monitoring is stable. Per radiology, there is no follow up needed, but we will follow for  an additional year out of caution. This will be due 11/2024. You will get a call closer to the time to get this scheduled.  Follow up video visit with [name] 1-2 weeks after the scan to review results. Call if you need us  sooner, or for any unintentional weight loss, or blood in the sputum.  Please contact office for sooner follow up if symptoms do not improve or worsen or seek emergency care   Happy Holidays.

## 2023-12-10 DIAGNOSIS — R928 Other abnormal and inconclusive findings on diagnostic imaging of breast: Secondary | ICD-10-CM | POA: Diagnosis not present

## 2023-12-12 ENCOUNTER — Other Ambulatory Visit (HOSPITAL_COMMUNITY): Payer: Self-pay

## 2023-12-12 ENCOUNTER — Other Ambulatory Visit: Payer: Self-pay

## 2023-12-12 NOTE — Progress Notes (Signed)
 Specialty Pharmacy Refill Coordination Note  [name] is a 63 y.o. female contacted today regarding refills of specialty medication(s) Adalimumab  (Humira  (2 Syringe))   Patient requested Delivery   Delivery date: 12/17/23   Verified address: [address], [place], [place] [address]   Medication will be filled on: 12/16/23

## 2023-12-13 ENCOUNTER — Encounter: Payer: Self-pay | Admitting: Internal Medicine

## 2023-12-13 NOTE — Patient Instructions (Addendum)
 We are referring you to Dr. Katharina lipid clinic to see if additional medication is warranted. Situational stress with nephew discussed.Labs reviewed. Colonoscopy due. Mammogram due. See GYN, Dr. Barbette annually. Return in one year or as needed.

## 2023-12-16 ENCOUNTER — Other Ambulatory Visit: Payer: Self-pay

## 2023-12-17 NOTE — Progress Notes (Unsigned)
 [name] [address] [place] [address] Phone: [phone] Subjective:   [name], am serving as a scribe for Dr. Arthea [name].  I'm seeing this patient by the request  of:  [name], [name], MD  CC: right finger pain   YEP:Dlagzrupcz  10/14/2023 Patient given injection and tolerated the procedure well, patient hopefully will make some improvement with this.  Will see if this is causing some of the decrease in range of motion.  Has been difficult for some time.  Follow-up again in 6 to 8 weeks otherwise.     Repeat injection given again today, tolerated the procedure well, discussed icing regimen and home exercises, which activities to do and which ones to avoid.  Increase activity slowly.  Discussed icing regimen.  Follow-up again in 6 to 8 weeks     Updated 12/18/2023 [name] is a 63 y.o. female coming in with complaint of R finger pain. Has been doing well. Thinks the finger is getting better.      Past Medical History:  Diagnosis Date   Anxiety    Allergy  induced asthma   Arthritis    Asthma    Breast cancer (HCC)    LEFT   Crohn disease (HCC)    Depression    GERD (gastroesophageal reflux disease)    History of methicillin resistant staphylococcus aureus (MRSA)    pt denies this.   History of radiation therapy    Left Breast- 05/09/22-06/11/22-Dr. Lynwood Nasuti   Multiple thyroid  nodules    Pneumonia    Port-A-Cath in place 03/25/2022   Sleep apnea    CPAP   Thyroid  cancer [facility])    Past Surgical History:  Procedure Laterality Date   ANAL FISSURE REPAIR  2004   BREAST LUMPECTOMY WITH RADIOACTIVE SEED LOCALIZATION Left 01/01/2022   Procedure: LEFT BREAST SEED LUMPECTOMY;  Surgeon: [name], MD;  Location: [facility] OR;  Service: General;  Laterality: Left;   CESAREAN SECTION     COLONOSCOPY     INSERTION OF MESH N/A 07/26/2020   Procedure: INSERTION OF MESH;  Surgeon: [name], MD;  Location: [facility] OR;   Service: General;  Laterality: N/A;   LIGAMENT REPAIR Right 11/23/2015   Procedure: right index radial collateral LIGAMENT REPAIR;  Surgeon: [name], MD;  Location: [facility];  Service: Orthopedics;  Laterality: Right;   PORTACATH PLACEMENT Right 02/07/2022   Procedure: INSERTION PORT-A-CATH;  Surgeon: [name], MD;  Location: [facility] OR;  Service: General;  Laterality: Right;   RE-EXCISION OF BREAST LUMPECTOMY Left 01/22/2022   Procedure: RE-EXCISION OF LEFT BREAST LUMPECTOMY;  Surgeon: [name], MD;  Location: [facility];  Service: General;  Laterality: Left;   SENTINEL NODE BIOPSY Left 01/22/2022   Procedure: LEFT SENTINEL NODE BIOPSY;  Surgeon: [name], MD;  Location: [facility];  Service: General;  Laterality: Left;   THYROIDECTOMY N/A 01/01/2022   Procedure: TOTAL THYROIDECTOMY WITH LIMITED LYMPH NODE DISSECTION;  Surgeon: [name], MD;  Location: [facility] OR;  Service: General;  Laterality: N/A;   UPPER ENDOSCOPY W/ ESOPHAGEAL MANOMETRY  10/2021   VENTRAL HERNIA REPAIR N/A 07/26/2020   Procedure: LAPAROSCOPIC VENTRAL HERNIA REPAIR WITH MESH;  Surgeon: [name], MD;  Location: [facility] OR;  Service: General;  Laterality: N/A;   Social History   Socioeconomic History   Marital status: Married    Spouse name: Not on file   Number of children: 2  Years of education: Not on file   Highest education level: Bachelor's degree (e.g., BA, AB, BS)  Occupational History   Occupation: Self employed  Tobacco Use   Smoking status: Never    Passive exposure: Never   Smokeless tobacco: Never  Vaping Use   Vaping status: Never Used  Substance and Sexual Activity   Alcohol use: Yes    Alcohol/week: 10.0 standard drinks of alcohol    Types: 10 Glasses of wine per week    Comment: 10 glasses/week   Drug use: Never   Sexual activity: Yes    Birth control/protection: Post-menopausal  Other Topics Concern   Not on file  Social  History Narrative   Not on file   Social Drivers of Health   Financial Resource Strain: Low Risk  (12/01/2023)   Overall Financial Resource Strain (CARDIA)    Difficulty of Paying Living Expenses: Not hard at all  Food Insecurity: No Food Insecurity (12/01/2023)   Hunger Vital Sign    Worried About Running Out of Food in the Last Year: Never true    Ran Out of Food in the Last Year: Never true  Transportation Needs: No Transportation Needs (12/01/2023)   PRAPARE - Administrator, Civil Service (Medical): No    Lack of Transportation (Non-Medical): No  Physical Activity: Sufficiently Active (12/01/2023)   Exercise Vital Sign    Days of Exercise per Week: 5 days    Minutes of Exercise per Session: 60 min  Stress: Stress Concern Present (12/01/2023)   [employer] of Occupational Health - Occupational Stress Questionnaire    Feeling of Stress: Rather much  Social Connections: Socially Integrated (12/01/2023)   Social Connection and Isolation Panel    Frequency of Communication with Friends and Family: More than three times a week    Frequency of Social Gatherings with Friends and Family: Twice a week    Attends Religious Services: More than 4 times per year    Active Member of [employer] or Organizations: Yes    Attends Engineer, Structural: More than 4 times per year    Marital Status: Married   Allergies  Allergen Reactions   Gabapentin Itching   Hydrocodone  Itching   Oxycodone  Hcl Itching    NDC Rniz:[id number]  NDC Rniz:[id number]   Family History  Problem Relation Age of Onset   Alcohol abuse Mother    Alcohol abuse Sister    Angioedema Neg Hx    Allergic rhinitis Neg Hx    Asthma Neg Hx    Atopy Neg Hx    Eczema Neg Hx    Immunodeficiency Neg Hx    Urticaria Neg Hx    Colon cancer Neg Hx    Stomach cancer Neg Hx    Esophageal cancer Neg Hx    Colon polyps Neg Hx     Current Outpatient Medications (Endocrine & Metabolic):     levothyroxine  (SYNTHROID ) 50 MCG tablet, Take 2 tablets (100 mcg total) by mouth daily before breakfast.   Current Outpatient Medications (Cardiovascular):    ezetimibe  (ZETIA ) 10 MG tablet, Take 1 tablet (10 mg total) by mouth daily.   Current Outpatient Medications (Respiratory):    albuterol  (VENTOLIN  HFA) 108 (90 Base) MCG/ACT inhaler, Inhale 2 puffs into the lungs every 6 (six) hours as needed for wheezing or shortness of breath.   cetirizine (ZYRTEC) 10 MG tablet, Take 10 mg by mouth in the morning.   Current Outpatient Medications (Analgesics):    adalimumab  (HUMIRA , 2  SYRINGE,) 40 MG/0.8ML prefilled syringe, Inject 0.8 mLs (40 mg total) into the skin every 14 (fourteen) days.   celecoxib  (CELEBREX ) 100 MG capsule, Take 1 capsule (100 mg total) by mouth in the morning and at bedtime.     Current Outpatient Medications (Other):    anastrozole  (ARIMIDEX ) 1 MG tablet, Take 1 tablet (1 mg total) by mouth daily.   ascorbic acid (VITAMIN C) 500 MG tablet, Take 500 mg by mouth 2 (two) times a week.   bimatoprost  (LUMIGAN ) 0.01 % SOLN, Instill 1 drop into both eyes at bedtime   busPIRone  (BUSPAR ) 7.5 MG tablet, Take 1 tablet (7.5 mg total) by mouth 2 (two) times daily.   clindamycin  (CLEOCIN  T) 1 % lotion, Apply 1 Application topically 2 (two) times daily.   clotrimazole -betamethasone  (LOTRISONE ) cream, Apply 1 Application topically as needed.   DULoxetine  (CYMBALTA ) 60 MG capsule, Take 1 capsule (60 mg total) by mouth daily.   fluocinonide ointment (LIDEX) 0.05 %, Apply 1 application  topically daily as needed (irritation).   Magnesium 250 MG TABS, Take by mouth.   mupirocin  ointment (BACTROBAN ) 2 %, Apply 1 Application topically as needed.   omeprazole  (PRILOSEC) 40 MG capsule, Take 1 capsule (40 mg total) by mouth daily before supper.  Current Facility-Administered Medications (Other):    0.9 %  sodium chloride  infusion   Objective  Blood pressure 118/72, pulse 69, height 5' 2  (1.575 m), last menstrual period 01/10/2014, SpO2 98%.   General: No apparent distress alert and oriented x3 mood and affect normal, dressed appropriately.  HEENT: Pupils equal, extraocular movements intact  Respiratory: Patient's speak in full sentences and does not appear short of breath  Cardiovascular: No lower extremity edema, non tender, no erythema  Right finger pain patient states nontender on exam today.  Patient is able to move it with no significant difficulty.  No locking.    Impression and Recommendations:    The above documentation has been reviewed and is accurate and complete Stacey Mckenny M Seren Chaloux, DO

## 2023-12-18 ENCOUNTER — Other Ambulatory Visit: Payer: Self-pay

## 2023-12-18 ENCOUNTER — Ambulatory Visit: Admitting: Family Medicine

## 2023-12-18 VITALS — BP 118/72 | HR 69 | Ht 62.0 in

## 2023-12-18 DIAGNOSIS — M1811 Unilateral primary osteoarthritis of first carpometacarpal joint, right hand: Secondary | ICD-10-CM

## 2023-12-18 DIAGNOSIS — M79641 Pain in right hand: Secondary | ICD-10-CM | POA: Diagnosis not present

## 2023-12-18 NOTE — Assessment & Plan Note (Signed)
 Doing well at this time.  No changes.  Will continue to monitor.

## 2023-12-18 NOTE — Patient Instructions (Addendum)
 Good to see you! Looks amazing See you again in 3 months just in case

## 2023-12-30 ENCOUNTER — Other Ambulatory Visit (HOSPITAL_COMMUNITY): Payer: Self-pay

## 2023-12-31 ENCOUNTER — Other Ambulatory Visit (HOSPITAL_COMMUNITY): Payer: Self-pay

## 2024-01-02 ENCOUNTER — Other Ambulatory Visit: Payer: Self-pay

## 2024-01-03 NOTE — Progress Notes (Unsigned)
 " Cardiology Office Note:  .   Date:  01/06/2024 ID:  [name], DOB November 18, 1960, MRN [id number] PCP: Perri Ronal PARAS, MD [facility] Providers Cardiologist:  None   Patient Profile: .      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   Discussed the use of AI scribe software for clinical note transcription with the patient, who gave verbal consent to proceed.  History of Present Illness [name] is a very pleasant 63 year old female who presents for evaluation of her cholesterol management. She previously took rosuvastatin  but stopped due to muscle aches. She now takes Zetia  with minimal improvement in cholesterol despite efforts at a healthy diet and active lifestyle. She is concerned about stroke risk because of family history of strokes in her grandmother and uncle.  She underwent chemotherapy and radiation for treatment of breast cancer she has Crohn's disease in remission and arthritis and is unsure of current inflammation markers. She does not recall recent A1c testing. She remains physically active with walking, hiking, and tennis and generally eats a healthy diet but admits to some indiscretion secondary to stress. Thyroid  function is stable currently and she has no history of hypertension or smoking.  She denies chest pain, shortness of breath, orthopnea, PND, palpitations, edema, presyncope, syncope.   Family history: Her family history includes Alcohol abuse in her mother and sister.   Discussed the use of AI scribe software for clinical note transcription with the patient, who gave verbal consent to proceed.  ASCVD Risk Score: ASCVD (Atherosclerotic Cardiovascular Disease) Risk Algorithm including Known ASCVD from  AHA/ACC from [email or web address] on 01/03/2024 ** All calculations should be rechecked by clinician prior to use **  RESULT SUMMARY: 3.2 % Risk of cardiovascular event (coronary or stroke death or non-fatal MI or stroke) in next 10 years.  No statin recommended because 10-year risk <5%; always encourage healthy cardiovascular lifestyle choices. Some patients with other high risk features may still be appropriate for treatment.  INPUTS: History of ASCVD --> 0 = No LDL Cholesterol >=190mg /dL (5.07 mmol/L) --> 0 = No Age --> 63 years Diabetes --> 0 = No Sex --> 0 = Female Total Cholesterol --> 266 mg/dL HDL Cholesterol --> 880 mg/dL Systolic Blood Pressure --> 120 mm Hg Treatment for Hypertension --> 0 = No Smoker --> 0 = No Race --> 1 = White  No results found for: LIPOA   ROS: See HPI       Studies Reviewed: SABRA   EKG Interpretation Date/Time:  Tuesday January 06 2024 09:15:41 EST Ventricular Rate:  76 PR Interval:  150 QRS Duration:  78 QT Interval:  402 QTC Calculation: 452 R Axis:   67  Text Interpretation: Normal sinus rhythm Normal ECG When compared with ECG of 22-Jan-2022 11:04, Premature supraventricular complexes are no longer Present Confirmed by Percy Browning [phone]) on 01/06/2024 9:38:26 AM      Risk Assessment/Calculations:             Physical Exam:   VS: BP 120/72   Pulse 72   Ht 5' 2 (1.575 m)   Wt 146 lb 9.6 oz (66.5 kg)   LMP 01/10/2014   SpO2 96%   BMI 26.81 kg/m   Wt Readings from Last 3 Encounters:  01/06/24  146 lb 9.6 oz (66.5 kg)  01/05/24 148 lb 2 oz (67.2 kg)  12/09/23 140 lb (63.5 kg)     GEN: Well nourished, well developed in no acute distress NECK: No JVD; No carotid bruits CARDIAC: RRR, no murmurs, rubs, gallops RESPIRATORY:  Clear to auscultation without rales, wheezing or rhonchi  ABDOMEN: Soft, non-tender, non-distended EXTREMITIES:  No edema; No deformity     ASSESSMENT AND PLAN: .    Assessment &  Plan Dyslipidemia Family history ASCVD Cardiac risk History of mildly elevated LDL with only minimal improvement on Zetia . Family history of stroke in her grandmother and uncle, particularly concerning in her ankle given he lives a fairly healthy lifestyle.  CT calcium  score of 0 10/2021.  EKG today reveals NSR, no ST/T abnormality. She did not tolerate rosuvastatin  5 mg 3 days a week due to myalgia.  She is tolerating Zetia  without any concerning side effects. ASCVD risk score is 3.2%. Recommend further evaluation of potential risk markers including NMR lipid panel, apolipoprotein B, lipoprotein A, A1c, and high sensitivity CRP. We discussed potential nonstatin lipid-lowering therapies to consider if indicated based on the above results.  - Continue ezetimibe  10 mg daily - Get NMR, APO B, LP(a), A1c, and CRP for further risk stratification - Consider additional lipid-lowering therapy based on lab results  Crohn's disease   Her Crohn's disease is in remission.  She is on Humira  for management. Potential impact on inflammatory markers and cardiovascular risk has been discussed.  - Continue monitoring inflammatory markers as part of the cardiovascular risk assessment.  Thyroid  cancer with subsequent hypothyroidism   Her hypothyroidism is well-controlled with stable TSH on labs completed 12/01/23. Potential impact on cholesterol levels has been discussed if thyroid  function is not well controlled.  - Continue levothyroxine        Dispo: TBD after lab testing  Signed, Rosaline Bane, NP-C "

## 2024-01-05 ENCOUNTER — Ambulatory Visit: Attending: Surgery

## 2024-01-05 VITALS — Wt 148.1 lb

## 2024-01-05 DIAGNOSIS — H40013 Open angle with borderline findings, low risk, bilateral: Secondary | ICD-10-CM | POA: Diagnosis not present

## 2024-01-05 DIAGNOSIS — Z483 Aftercare following surgery for neoplasm: Secondary | ICD-10-CM | POA: Insufficient documentation

## 2024-01-05 NOTE — Therapy (Signed)
 " OUTPATIENT PHYSICAL THERAPY SOZO SCREENING NOTE   Patient Name: [name] MRN: [id number] DOB:02/06/1960, 63 y.o., female Today's Date: 01/05/2024  PCP: Perri Ronal PARAS, MD REFERRING PROVIDER: [name], MD   PT End of Session - 01/05/24 [phone]     Visit Number 11   # unchanged due to screen only   PT Start Time 0834    PT Stop Time 0838    PT Time Calculation (min) 4 min    Activity Tolerance Patient tolerated treatment well    Behavior During Therapy WFL for tasks assessed/performed          Past Medical History:  Diagnosis Date   Anxiety    Allergy  induced asthma   Arthritis    Asthma    Breast cancer (HCC)    LEFT   Crohn disease (HCC)    Depression    GERD (gastroesophageal reflux disease)    History of methicillin resistant staphylococcus aureus (MRSA)    pt denies this.   History of radiation therapy    Left Breast- 05/09/22-06/11/22-Dr. Lynwood Nasuti   Multiple thyroid  nodules    Pneumonia    Port-A-Cath in place 03/25/2022   Sleep apnea    CPAP   Thyroid  cancer [facility])    Past Surgical History:  Procedure Laterality Date   ANAL FISSURE REPAIR  2004   BREAST LUMPECTOMY WITH RADIOACTIVE SEED LOCALIZATION Left 01/01/2022   Procedure: LEFT BREAST SEED LUMPECTOMY;  Surgeon: [name], MD;  [facility]: [facility] OR;  Service: General;  Laterality: Left;   CESAREAN SECTION     COLONOSCOPY     INSERTION OF MESH N/A 07/26/2020   Procedure: INSERTION OF MESH;  Surgeon: [name], MD;  Location[facility] OR;  Service: General;  Laterality: N/A;   LIGAMENT REPAIR Right 11/23/2015   Procedure: right index radial collateral LIGAMENT REPAIR;  Surgeon: [name], MD;  Location: [facility];  Service: Orthopedics;  Laterality: Right;   PORTACATH PLACEMENT Right 02/07/2022   Procedure: INSERTION PORT-A-CATH;  Surgeon: [name], MD;  [facility] OR;  Service: General;  Laterality: Right;   RE-EXCISION OF BREAST LUMPECTOMY Left 01/22/2022    Procedure: RE-EXCISION OF LEFT BREAST LUMPECTOMY;  Surgeon: [name], MD;  Location: [facility];  Service: General;  Laterality: Left;   SENTINEL NODE BIOPSY Left 01/22/2022   Procedure: LEFT SENTINEL NODE BIOPSY;  Surgeon: [name], MD;  Location: [facility];  Service: General;  Laterality: Left;   THYROIDECTOMY N/A 01/01/2022   Procedure: TOTAL THYROIDECTOMY WITH LIMITED LYMPH NODE DISSECTION;  Surgeon: [name], MD;  [facility];  Service: General;  Laterality: N/A;   UPPER ENDOSCOPY W/ ESOPHAGEAL MANOMETRY  10/2021   VENTRAL HERNIA REPAIR N/A 07/26/2020   Procedure: LAPAROSCOPIC VENTRAL HERNIA REPAIR WITH MESH;  Surgeon: [name], MD;  Location: [facility] OR;  Service: General;  Laterality: N/A;   Patient Active Problem List   Diagnosis Date Noted   Traumatic arthropathy of metacarpophalangeal (MCP) joint 10/14/2023   AC (acromioclavicular) arthritis 04/08/2023   Ganglion cyst of finger of right hand 02/06/2023   Arthritis of carpometacarpal [facility]) joint of right thumb 02/06/2023   Carpal tunnel syndrome 02/06/2023   Malignant neoplasm of upper-inner quadrant of left breast in female, estrogen receptor positive (HCC) 01/09/2022   Postsurgical hypothyroidism 01/04/2022   Multiple thyroid  nodules 12/20/2021   Papillary thyroid  carcinoma (HCC) 12/20/2021   Pulmonary nodule 10/17/2021   Left upper lobe pulmonary nodule 10/17/2021   Acromioclavicular  sprain, left, initial encounter 07/27/2021   Tibialis posterior tendinitis, right 06/15/2020   Piriformis syndrome of left side 06/15/2020   Umbilical hernia 03/21/2020   Lumbar radiculopathy 07/09/2019   Contusion of right hip and thigh 04/20/2019   Loss of transverse plantar arch 02/11/2019   Sprain of iliolumbar ligament 10/29/2018   Piriformis syndrome of right side 09/30/2018   Nonallopathic lesion of sacral region 09/30/2018   Nonallopathic lesion of lumbosacral region 09/30/2018    Nonallopathic lesion of thoracic region 09/30/2018   Peroneal tendinitis of left lower extremity 02/11/2018   Polyarthralgia 02/11/2018   Patellofemoral syndrome of right knee 02/11/2018   Gastroesophageal reflux disease 01/16/2017   Cough variant asthma 01/16/2017   Noncompliance with medication treatment due to intermittent use of medication 01/16/2017   Allergic rhinoconjunctivitis 11/08/2015   Sinobronchitis 10/03/2015   OSA (obstructive sleep apnea) 12/14/2014   Crohn's disease of colon (HCC) 05/24/2014   Depression 02/01/2014   Disorder of intestine 12/17/2010   Anorectal polyp 12/17/2010   Glaucoma 01/14/2010   Dry skin dermatitis 01/15/2008   Crohn's disease (HCC) 01/14/2005    REFERRING DIAG: left breast cancer at risk for lymphedema  THERAPY DIAG: Aftercare following surgery for neoplasm  PERTINENT HISTORY: Patient was diagnosed on 01/01/2022 with 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 It measures 1.5 cm and is located in the upper inner quadrant with IDC only being present in lateral margin. It is ER/PR positive and HER2 negative with a Ki-67 of 5%. Thyroidectomy was also performed at the same time and 2 lymph nodes removed (nodes were negative) and all gross disease removed. Thyroid  cancer will be treated with radioactive iodine   PRECAUTIONS: left UE Lymphedema risk, None  SUBJECTIVE: Pt returns for her 3 month L-Dex screen.   PAIN:  Are you having pain? No  SOZO SCREENING: Patient was assessed today using the SOZO machine to determine the lymphedema index score. This was compared to her baseline score. It was determined that she is within the recommended range when compared to her baseline and no further action is needed at this time. She will continue SOZO screenings. These are done every 3 months for 2 years post operatively followed by every 6 months for 2 years, and then annually.   L-DEX FLOWSHEETS - 01/05/24 0800       L-DEX  LYMPHEDEMA SCREENING   Measurement Type Unilateral    L-DEX MEASUREMENT EXTREMITY Upper Extremity    POSITION  Standing    DOMINANT SIDE Right    At Risk Side Left    BASELINE SCORE (UNILATERAL) 2.8    L-DEX SCORE (UNILATERAL) -0.8    VALUE CHANGE (UNILAT) -3.6          P: Cont every 3 months until 2 years from her breast surgery.  Aden Berwyn Caldron, PTA 01/05/2024, 8:38 AM     "

## 2024-01-06 ENCOUNTER — Encounter (HOSPITAL_BASED_OUTPATIENT_CLINIC_OR_DEPARTMENT_OTHER): Payer: Self-pay | Admitting: Nurse Practitioner

## 2024-01-06 ENCOUNTER — Ambulatory Visit (INDEPENDENT_AMBULATORY_CARE_PROVIDER_SITE_OTHER): Admitting: Nurse Practitioner

## 2024-01-06 ENCOUNTER — Other Ambulatory Visit (HOSPITAL_COMMUNITY): Payer: Self-pay

## 2024-01-06 VITALS — BP 120/72 | HR 72 | Ht 62.0 in | Wt 146.6 lb

## 2024-01-06 DIAGNOSIS — E785 Hyperlipidemia, unspecified: Secondary | ICD-10-CM

## 2024-01-06 DIAGNOSIS — Z7689 Persons encountering health services in other specified circumstances: Secondary | ICD-10-CM | POA: Diagnosis not present

## 2024-01-06 DIAGNOSIS — Z7189 Other specified counseling: Secondary | ICD-10-CM | POA: Diagnosis not present

## 2024-01-06 DIAGNOSIS — Z8249 Family history of ischemic heart disease and other diseases of the circulatory system: Secondary | ICD-10-CM | POA: Diagnosis not present

## 2024-01-06 DIAGNOSIS — E890 Postprocedural hypothyroidism: Secondary | ICD-10-CM | POA: Diagnosis not present

## 2024-01-06 DIAGNOSIS — K50919 Crohn's disease, unspecified, with unspecified complications: Secondary | ICD-10-CM | POA: Diagnosis not present

## 2024-01-06 DIAGNOSIS — C73 Malignant neoplasm of thyroid gland: Secondary | ICD-10-CM

## 2024-01-06 NOTE — Patient Instructions (Signed)
 Medication Instructions:   Your physician recommends that you continue on your current medications as directed. Please refer to the Current Medication list given to you today.   *If you need a refill on your cardiac medications before your next appointment, please call your pharmacy*  Lab Work: Your physician recommends that you return for a FASTING lipid profile at your convenience.     If you have labs (blood work) drawn today and your tests are completely normal, you will receive your results only by: MyChart Message (if you have MyChart) OR A paper copy in the mail If you have any lab test that is abnormal or we need to change your treatment, we will call you to review the results.  Testing/Procedures:  None ordered.   Follow-Up: At [facility], you and your health needs are our priority.  As part of our continuing mission to provide you with exceptional heart care, our providers are all part of one team.  This team includes your primary Cardiologist (physician) and Advanced Practice Providers or APPs (Physician Assistants and Nurse Practitioners) who all work together to provide you with the care you need, when you need it.  Your next appointment:   To be determined   Provider:   Rosaline Bane, NP    We recommend signing up for the patient portal called MyChart.  Sign up information is provided on this After Visit Summary.  MyChart is used to connect with patients for Virtual Visits (Telemedicine).  Patients are able to view lab/test results, encounter notes, upcoming appointments, etc.  Non-urgent messages can be sent to your provider as well.   To learn more about what you can do with MyChart, go to [email or web address].   Other Instructions         Exercise Recommendations: Dr. Glade Che and Dr. Wonda Silvan

## 2024-01-07 ENCOUNTER — Other Ambulatory Visit: Payer: Self-pay

## 2024-01-09 ENCOUNTER — Other Ambulatory Visit: Payer: Self-pay

## 2024-01-09 NOTE — Progress Notes (Signed)
 Specialty Pharmacy Refill Coordination Note  [name] is a 63 y.o. female contacted today regarding refills of specialty medication(s) Adalimumab  (Humira  (2 Syringe))   Patient requested No data recorded  Delivery date: 01/20/24   Verified address: [address] [place], [place] [address]   Medication will be filled on: 01/19/24

## 2024-01-19 ENCOUNTER — Other Ambulatory Visit: Payer: Self-pay

## 2024-01-26 ENCOUNTER — Other Ambulatory Visit (HOSPITAL_COMMUNITY): Payer: Self-pay

## 2024-01-26 ENCOUNTER — Telehealth: Payer: Self-pay | Admitting: Internal Medicine

## 2024-01-26 ENCOUNTER — Other Ambulatory Visit: Payer: Self-pay

## 2024-01-26 NOTE — Telephone Encounter (Signed)
 NA

## 2024-02-04 ENCOUNTER — Other Ambulatory Visit (HOSPITAL_COMMUNITY): Payer: Self-pay

## 2024-02-04 ENCOUNTER — Other Ambulatory Visit: Payer: Self-pay

## 2024-02-04 ENCOUNTER — Other Ambulatory Visit: Payer: Self-pay | Admitting: Gastroenterology

## 2024-02-04 MED ORDER — OMEPRAZOLE 40 MG PO CPDR
40.0000 mg | DELAYED_RELEASE_CAPSULE | Freq: Every day | ORAL | 2 refills | Status: AC
  Filled 2024-02-04: qty 30, 30d supply, fill #0
  Filled 2024-02-04: qty 90, 90d supply, fill #0

## 2024-02-05 ENCOUNTER — Other Ambulatory Visit (HOSPITAL_COMMUNITY): Payer: Self-pay

## 2024-02-05 ENCOUNTER — Ambulatory Visit: Payer: 59 | Admitting: Internal Medicine

## 2024-02-05 ENCOUNTER — Other Ambulatory Visit

## 2024-02-05 ENCOUNTER — Encounter: Payer: Self-pay | Admitting: Internal Medicine

## 2024-02-05 VITALS — BP 120/60 | HR 90 | Ht 62.0 in | Wt 149.0 lb

## 2024-02-05 DIAGNOSIS — E890 Postprocedural hypothyroidism: Secondary | ICD-10-CM | POA: Diagnosis not present

## 2024-02-05 DIAGNOSIS — C73 Malignant neoplasm of thyroid gland: Secondary | ICD-10-CM | POA: Diagnosis not present

## 2024-02-05 DIAGNOSIS — E559 Vitamin D deficiency, unspecified: Secondary | ICD-10-CM

## 2024-02-05 NOTE — Patient Instructions (Addendum)
 Please continue levothyroxine  100 mcg 6/7 days and 50 mcg 1/7 days.  Take the thyroid  hormone every day, with water, at least 30 minutes before breakfast, separated by at least 4 hours from: - acid reflux medications - calcium  - iron - multivitamins  Please continue vitamin D  2000 units daily.  Please stop at the lab.  I ordered a neck ultrasound for you.  Please expect a call from [facility].  Please return to see me in 1 year.

## 2024-02-06 ENCOUNTER — Ambulatory Visit: Payer: Self-pay | Admitting: Internal Medicine

## 2024-02-06 ENCOUNTER — Other Ambulatory Visit (HOSPITAL_COMMUNITY): Payer: Self-pay

## 2024-02-06 LAB — THYROGLOBULIN LEVEL: Thyroglobulin: 0.1 ng/mL — ABNORMAL LOW

## 2024-02-06 LAB — TSH: TSH: 1.16 m[IU]/L (ref 0.40–4.50)

## 2024-02-06 LAB — VITAMIN D 25 HYDROXY (VIT D DEFICIENCY, FRACTURES): Vit D, 25-Hydroxy: 50 ng/mL (ref 30–100)

## 2024-02-06 LAB — THYROGLOBULIN ANTIBODY: Thyroglobulin Ab: <1 [IU]/mL

## 2024-02-06 MED ORDER — LEVOTHYROXINE SODIUM 50 MCG PO TABS
50.0000 ug | ORAL_TABLET | Freq: Every day | ORAL | 3 refills | Status: AC
  Filled 2024-02-06: qty 180, 180d supply, fill #0

## 2024-02-06 NOTE — Addendum Note (Signed)
 Addended by: TRIXIE FILE on: 02/06/2024 01:53 PM   Modules accepted: Orders

## 2024-02-10 NOTE — Progress Notes (Unsigned)
 " [facility] [address] [place] [address] Phone: [phone] Subjective:   [name], am serving as a scribe for Dr. Arthea Stacey.  I'm seeing this patient by the request  of:  [name], [name], MD  CC: Home pain, back and neck pain follow-up  YEP:Dlagzrupcz  12/18/2023 Doing well at this time.  No changes.  Will continue to monitor.     Updated 02/11/2024 [name] is a 64 y.o. female coming in with complaint of thumb pain, known arthritis in the thumb.. Patient states that her thumb is still bothering her. No change since last visit.   Also c/o L knee pain over medial aspect. Pain is constant and increases when walking down steps she notices increase in pain.   Also having pain in R foot near the 2nd and 3rd MTP joints for past 3-4 weeks. Pressure on ball of foot and cleaning in between her toes increases her pain.      Patient is scheduled for an ultrasound of the thyroid  but has had a thyroidectomy previously.  Just making sure no repeat cancer.  Past Medical History:  Diagnosis Date   Anxiety    Allergy  induced asthma   Arthritis    Asthma    Breast cancer (HCC)    LEFT   Crohn disease (HCC)    Depression    GERD (gastroesophageal reflux disease)    History of methicillin resistant staphylococcus aureus (MRSA)    pt denies this.   History of radiation therapy    Left Breast- 05/09/22-06/11/22-Dr. Lynwood Nasuti   Multiple thyroid  nodules    Pneumonia    Port-A-Cath in place 03/25/2022   Sleep apnea    CPAP   Thyroid  cancer [facility])    Past Surgical History:  Procedure Laterality Date   ANAL FISSURE REPAIR  2004   BREAST LUMPECTOMY WITH RADIOACTIVE SEED LOCALIZATION Left 01/01/2022   Procedure: LEFT BREAST SEED LUMPECTOMY;  Surgeon: [name], MD;  Location: [facility] OR;  Service: General;  Laterality: Left;   CESAREAN SECTION     COLONOSCOPY     INSERTION OF MESH N/A 07/26/2020   Procedure: INSERTION OF MESH;   Surgeon: [name], MD;  Location: [facility] OR;  Service: General;  Laterality: N/A;   LIGAMENT REPAIR Right 11/23/2015   Procedure: right index radial collateral LIGAMENT REPAIR;  Surgeon: [name], MD;  Location: [facility];  Service: Orthopedics;  Laterality: Right;   PORTACATH PLACEMENT Right 02/07/2022   Procedure: INSERTION PORT-A-CATH;  Surgeon: [name], MD;  [facility];  Service: General;  Laterality: Right;   RE-EXCISION OF BREAST LUMPECTOMY Left 01/22/2022   Procedure: RE-EXCISION OF LEFT BREAST LUMPECTOMY;  Surgeon: [name], MD;  Location: [facility];  Service: General;  Laterality: Left;   SENTINEL NODE BIOPSY Left 01/22/2022   Procedure: LEFT SENTINEL NODE BIOPSY;  Surgeon: [name], MD;  Location: [facility];  Service: General;  Laterality: Left;   THYROIDECTOMY N/A 01/01/2022   Procedure: TOTAL THYROIDECTOMY WITH LIMITED LYMPH NODE DISSECTION;  Surgeon: [name], MD;  Location[facility];  Service: General;  Laterality: N/A;   UPPER ENDOSCOPY W/ ESOPHAGEAL MANOMETRY  10/2021   VENTRAL HERNIA REPAIR N/A 07/26/2020   Procedure: LAPAROSCOPIC VENTRAL HERNIA REPAIR WITH MESH;  Surgeon: [name], MD;  Location: [facility] OR;  Service: General;  Laterality: N/A;   Social History   Socioeconomic History   Marital status: Married  Spouse name: Not on file   Number of children: 2   Years of education: Not on file   Highest education level: Bachelor's degree (e.g., BA, AB, BS)  Occupational History   Occupation: Self employed  Tobacco Use   Smoking status: Never    Passive exposure: Never   Smokeless tobacco: Never  Vaping Use   Vaping status: Never Used  Substance and Sexual Activity   Alcohol use: Yes    Alcohol/week: 10.0 standard drinks of alcohol    Types: 10 Glasses of wine per week    Comment: 10 glasses/week   Drug use: Never   Sexual activity: Yes    Birth control/protection: Post-menopausal   Other Topics Concern   Not on file  Social History Narrative   Not on file   Social Drivers of Health   Tobacco Use: Low Risk (02/05/2024)   Patient History    Smoking Tobacco Use: Never    Smokeless Tobacco Use: Never    Passive Exposure: Never  Financial Resource Strain: Low Risk (12/01/2023)   Overall Financial Resource Strain (CARDIA)    Difficulty of Paying Living Expenses: Not hard at all  Food Insecurity: No Food Insecurity (12/01/2023)   Epic    Worried About Programme Researcher, Broadcasting/film/video in the Last Year: Never true    Ran Out of Food in the Last Year: Never true  Transportation Needs: No Transportation Needs (12/01/2023)   Epic    Lack of Transportation (Medical): No    Lack of Transportation (Non-Medical): No  Physical Activity: Sufficiently Active (12/01/2023)   Exercise Vital Sign    Days of Exercise per Week: 5 days    Minutes of Exercise per Session: 60 min  Stress: Stress Concern Present (12/01/2023)   [employer] of Occupational Health - Occupational Stress Questionnaire    Feeling of Stress: Rather much  Social Connections: Socially Integrated (12/01/2023)   Social Connection and Isolation Panel    Frequency of Communication with Friends and Family: More than three times a week    Frequency of Social Gatherings with Friends and Family: Twice a week    Attends Religious Services: More than 4 times per year    Active Member of Clubs or Organizations: Yes    Attends Banker Meetings: More than 4 times per year    Marital Status: Married  Depression (PHQ2-9): Medium Risk (12/02/2023)   Depression (PHQ2-9)    PHQ-2 Score: 9  Alcohol Screen: Low Risk (12/01/2023)   Alcohol Screen    Last Alcohol Screening Score (AUDIT): 4  Housing: Low Risk (12/01/2023)   Epic    Unable to Pay for Housing in the Last Year: No    Number of Times Moved in the Last Year: 0    Homeless in the Last Year: No  Utilities: Not At Risk (01/06/2024)   Epic    Threatened  with loss of utilities: No  Health Literacy: Adequate Health Literacy (01/06/2024)   B1300 Health Literacy    Frequency of need for help with medical instructions: Never   Allergies[1] Family History  Problem Relation Age of Onset   Alcohol abuse Mother    Alcohol abuse Sister    Angioedema Neg Hx    Allergic rhinitis Neg Hx    Asthma Neg Hx    Atopy Neg Hx    Eczema Neg Hx    Immunodeficiency Neg Hx    Urticaria Neg Hx    Colon cancer Neg Hx    Stomach  cancer Neg Hx    Esophageal cancer Neg Hx    Colon polyps Neg Hx     Current Outpatient Medications (Endocrine & Metabolic):    levothyroxine  (SYNTHROID ) 50 MCG tablet, Take 1 tablet (50 mcg total) by mouth daily before breakfast. Take by mouth before breakfast 2 tablets (100 mcg total) by mouth 6 out of 7 days and 1 tablet (50 mcg) 1 out of 7 days  Current Outpatient Medications (Cardiovascular):    ezetimibe  (ZETIA ) 10 MG tablet, Take 1 tablet (10 mg total) by mouth daily.  Current Outpatient Medications (Respiratory):    albuterol  (VENTOLIN  HFA) 108 (90 Base) MCG/ACT inhaler, Inhale 2 puffs into the lungs every 6 (six) hours as needed for wheezing or shortness of breath.   cetirizine (ZYRTEC) 10 MG tablet, Take 10 mg by mouth in the morning.  Current Outpatient Medications (Analgesics):    adalimumab  (HUMIRA , 2 SYRINGE,) 40 MG/0.8ML prefilled syringe, Inject 0.8 mLs (40 mg total) into the skin every 14 (fourteen) days.   celecoxib  (CELEBREX ) 100 MG capsule, Take 1 capsule (100 mg total) by mouth in the morning and at bedtime.  Current Outpatient Medications (Other):    anastrozole  (ARIMIDEX ) 1 MG tablet, Take 1 tablet (1 mg total) by mouth daily.   ascorbic acid (VITAMIN C) 500 MG tablet, Take 500 mg by mouth 2 (two) times a week.   bimatoprost  (LUMIGAN ) 0.01 % SOLN, Instill 1 drop into both eyes at bedtime   busPIRone  (BUSPAR ) 7.5 MG tablet, Take 1 tablet (7.5 mg total) by mouth 2 (two) times daily.   clindamycin  (CLEOCIN   T) 1 % lotion, Apply 1 Application topically 2 (two) times daily.   clotrimazole -betamethasone  (LOTRISONE ) cream, Apply 1 Application topically as needed.   DULoxetine  (CYMBALTA ) 60 MG capsule, Take 1 capsule (60 mg total) by mouth daily.   fluocinonide ointment (LIDEX) 0.05 %, Apply 1 application  topically daily as needed (irritation).   gabapentin  (NEURONTIN ) 100 MG capsule, Take 2 capsules (200 mg total) by mouth at bedtime.   Magnesium 250 MG TABS, Take by mouth.   mupirocin  ointment (BACTROBAN ) 2 %, Apply 1 Application topically as needed.   omeprazole  (PRILOSEC) 40 MG capsule, Take 1 capsule (40 mg total) by mouth daily before supper.  Current Facility-Administered Medications (Other):    0.9 %  sodium chloride  infusion   Reviewed prior external information including notes and imaging from  primary care provider As well as notes that were available from care everywhere and other healthcare systems.  Past medical history, social, surgical and family history all reviewed in electronic medical record.  No pertanent information unless stated regarding to the chief complaint.   Review of Systems:  No headache, visual changes, nausea, vomiting, diarrhea, constipation, dizziness, abdominal pain, skin rash, fevers, chills, night sweats, weight loss, swollen lymph nodes, body aches, joint swelling, chest pain, shortness of breath, mood changes. POSITIVE muscle aches  Objective  Blood pressure 122/72, pulse 83, height 5' 2 (1.575 m), weight 149 lb (67.6 kg), last menstrual period 01/10/2014, SpO2 98%.   General: No apparent distress alert and oriented x3 mood and affect normal, dressed appropriately.  HEENT: Pupils equal, extraocular movements intact  Respiratory: Patient's speak in full sentences and does not appear short of breath  Cardiovascular: No lower extremity edema, non tender, no erythema  Thumb Exam shows patient does have a positive grind test noted.  More pain though more  proximally than on the [facility] joint. Patient's left knee tender to palpation over the  medial joint line.  Mild positive McMurray's.  Lateral tracking of the patella noted. Patient's foot exam does have a positive squeeze test noted on the right side.  Bunion and bunionette formation noted.  Breakdown noted the transverse arch  After informed written and verbal consent, patient was seated on exam table. Left knee was prepped with alcohol swab and utilizing anterolateral approach, patient's left knee space was injected with 4:1  marcaine  0.5%: Kenalog 40mg /dL. Patient tolerated the procedure well without immediate complications.  Limited muscular skeletal ultrasound was performed and interpreted by [name], M  Limited ultrasound of patient's left leg does show that there is an area of hypoechoic change noted between the 3rd and 4th metatarsal heads consistent with a neuroma.  Regarding patient's thumb does have some CMC arthritis but no significant swelling.  Patient does have what appears to be a injured tendon sheath ganglion cyst noted on the abductor pollicis longus of the palmar aspect of the hand.  Does have some enlargement also of the median nerve that is consistent with mild to moderate carpal tunnel as well.   Impression and Recommendations:    The above documentation has been reviewed and is accurate and complete [name] CHRISTELLA Claudene, DO        [1]  Allergies Allergen Reactions   Gabapentin  Itching   Hydrocodone  Itching   Oxycodone  Hcl Itching    NDC Rniz:[id number]  NDC Rniz:[id number]   "

## 2024-02-11 ENCOUNTER — Other Ambulatory Visit: Payer: Self-pay

## 2024-02-11 ENCOUNTER — Ambulatory Visit: Admitting: Family Medicine

## 2024-02-11 ENCOUNTER — Encounter (INDEPENDENT_AMBULATORY_CARE_PROVIDER_SITE_OTHER): Payer: Self-pay

## 2024-02-11 ENCOUNTER — Ambulatory Visit

## 2024-02-11 ENCOUNTER — Other Ambulatory Visit (HOSPITAL_COMMUNITY): Payer: Self-pay

## 2024-02-11 ENCOUNTER — Encounter: Payer: Self-pay | Admitting: Family Medicine

## 2024-02-11 VITALS — BP 122/72 | HR 83 | Ht 62.0 in | Wt 149.0 lb

## 2024-02-11 DIAGNOSIS — M79671 Pain in right foot: Secondary | ICD-10-CM

## 2024-02-11 DIAGNOSIS — M25562 Pain in left knee: Secondary | ICD-10-CM | POA: Diagnosis not present

## 2024-02-11 DIAGNOSIS — M1712 Unilateral primary osteoarthritis, left knee: Secondary | ICD-10-CM

## 2024-02-11 DIAGNOSIS — M216X1 Other acquired deformities of right foot: Secondary | ICD-10-CM

## 2024-02-11 DIAGNOSIS — M1811 Unilateral primary osteoarthritis of first carpometacarpal joint, right hand: Secondary | ICD-10-CM

## 2024-02-11 DIAGNOSIS — M79641 Pain in right hand: Secondary | ICD-10-CM

## 2024-02-11 MED ORDER — GABAPENTIN 100 MG PO CAPS
200.0000 mg | ORAL_CAPSULE | Freq: Every day | ORAL | 0 refills | Status: AC
  Filled 2024-02-11: qty 180, 90d supply, fill #0

## 2024-02-11 NOTE — Patient Instructions (Addendum)
 Xray today Knee injection today Arch ex Try gabapentin  can send in 200mg  at night [employer] Recovery sandals [place] tool See me again in 6-8 weeks

## 2024-02-11 NOTE — Assessment & Plan Note (Signed)
 Patient given injection today, will hold on imaging but may need to consider it in the future.  Could be a good candidate for viscosupplementation.  Discussed icing regimen, discussed home exercises, increase activity slowly.  Follow-up again in 6 to 8 weeks

## 2024-02-11 NOTE — Assessment & Plan Note (Addendum)
 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  Discussed icing regimen and home exercises, discussed which activities to do and which ones to avoid.  Increase activity slowly.  Discussed icing regimen.  Discussed proper shoes, over-the-counter orthotics would be more beneficial at this point.  Worsening pain consider injection gabapentin  prescribed as well

## 2024-02-11 NOTE — Assessment & Plan Note (Signed)
 Arthritis noted but definitely think it instead there is likely a cyst noted in the abductor pollicis longus tendon sheath and may need injection.  Patient wanted to hold at this time.  Differential includes the possibility of carpal tunnel as well and we will monitor.  Patient wants to see how patient does with the other treatment first.  Has had a ganglion cyst previously of the finger.

## 2024-02-12 ENCOUNTER — Other Ambulatory Visit: Payer: Self-pay

## 2024-02-12 ENCOUNTER — Ambulatory Visit
Admission: RE | Admit: 2024-02-12 | Discharge: 2024-02-12 | Disposition: A | Discharge status: Home / Self Care | Source: Ambulatory Visit | Attending: Internal Medicine | Admitting: Internal Medicine

## 2024-02-12 ENCOUNTER — Telehealth: Payer: Self-pay

## 2024-02-12 DIAGNOSIS — C73 Malignant neoplasm of thyroid gland: Secondary | ICD-10-CM

## 2024-02-12 NOTE — Telephone Encounter (Signed)
 [employer] benefits ran for left knee Case [phone]

## 2024-02-12 NOTE — Telephone Encounter (Signed)
-----   Message from Joesph JONELLE Gully sent at 02/11/2024  4:26 PM EST ----- Regarding: Gel approval L knee gel approval please

## 2024-02-12 NOTE — Progress Notes (Signed)
 Specialty Pharmacy Refill Coordination Note  [name] is a 64 y.o. female contacted today regarding refills of specialty medication(s) Adalimumab  (Humira  (2 Syringe))   Patient requested Delivery   Delivery date: 02/13/24   Verified address: [address] [place], [place] [address]   Medication will be filled on: 02/12/24

## 2024-02-16 ENCOUNTER — Ambulatory Visit: Payer: Self-pay | Admitting: Family Medicine

## 2024-02-16 LAB — NMR, LIPOPROFILE
Cholesterol, Total: 240 mg/dL — ABNORMAL HIGH (ref 100–199)
HDL Particle Number: 47.4 umol/L
HDL-C: 105 mg/dL
LDL Particle Number: 969 nmol/L
LDL Size: 21.6 nm
LDL-C (NIH Calc): 121 mg/dL — ABNORMAL HIGH (ref 0–99)
LP-IR Score: 25
Small LDL Particle Number: <90 nmol/L
Triglycerides: 81 mg/dL (ref 0–149)

## 2024-02-16 LAB — HEMOGLOBIN A1C
Est. average glucose Bld gHb Est-mCnc: 114 mg/dL
Hgb A1c MFr Bld: 5.6 % (ref 4.8–5.6)

## 2024-02-16 LAB — APOLIPOPROTEIN B: Apolipoprotein B: 90 mg/dL — ABNORMAL HIGH

## 2024-02-16 LAB — HIGH SENSITIVITY CRP: CRP, High Sensitivity: 0.34 mg/L (ref 0.00–3.00)

## 2024-02-16 LAB — LIPOPROTEIN A (LPA): Lipoprotein (a): 10.3 nmol/L

## 2024-02-17 ENCOUNTER — Telehealth (HOSPITAL_BASED_OUTPATIENT_CLINIC_OR_DEPARTMENT_OTHER): Payer: Self-pay | Admitting: Nurse Practitioner

## 2024-02-17 ENCOUNTER — Other Ambulatory Visit (HOSPITAL_COMMUNITY): Payer: Self-pay

## 2024-02-17 ENCOUNTER — Ambulatory Visit (HOSPITAL_BASED_OUTPATIENT_CLINIC_OR_DEPARTMENT_OTHER): Payer: Self-pay | Admitting: Nurse Practitioner

## 2024-02-17 ENCOUNTER — Telehealth: Payer: Self-pay | Admitting: Hematology and Oncology

## 2024-02-17 NOTE — Telephone Encounter (Signed)
 I spoke with patient and she is aware of rescheduled appointment from 03/09/2024 to 03/23/2024.

## 2024-02-18 ENCOUNTER — Other Ambulatory Visit (HOSPITAL_COMMUNITY): Payer: Self-pay

## 2024-02-18 ENCOUNTER — Other Ambulatory Visit (HOSPITAL_BASED_OUTPATIENT_CLINIC_OR_DEPARTMENT_OTHER): Payer: Self-pay | Admitting: Nurse Practitioner

## 2024-02-18 ENCOUNTER — Telehealth: Payer: Self-pay | Admitting: Pharmacy Technician

## 2024-02-18 DIAGNOSIS — E785 Hyperlipidemia, unspecified: Secondary | ICD-10-CM

## 2024-02-18 MED ORDER — NEXLETOL 180 MG PO TABS
180.0000 mg | ORAL_TABLET | Freq: Every day | ORAL | 3 refills | Status: DC
  Filled 2024-02-18: qty 90, 90d supply, fill #0

## 2024-02-18 MED ORDER — NEXLIZET 180-10 MG PO TABS
1.0000 | ORAL_TABLET | Freq: Every day | ORAL | 3 refills | Status: AC
  Filled 2024-02-18 (×2): qty 90, 90d supply, fill #0

## 2024-02-18 NOTE — Telephone Encounter (Signed)
 Patient agreeable to start Nexlizet  180-10 mg daily. She will return for lipid panel and Apo B testing in 2-3 months.

## 2024-02-18 NOTE — Addendum Note (Signed)
 Addended by: PERCY ROSALINE HERO on: 02/18/2024 05:03 PM   Modules accepted: Orders

## 2024-02-18 NOTE — Telephone Encounter (Signed)
 Had to fill precert form and fax clinicals

## 2024-02-18 NOTE — Telephone Encounter (Signed)
 Pharmacy Patient Advocate Encounter  Received notification from [employer] that Prior Authorization for Nexlizet  has been APPROVED from 02/18/24 to 02/16/25. Ran test claim, Copay is $10.00- one month. This test claim was processed through [facility]- copay amounts may vary at other pharmacies due to pharmacy/plan contracts, or as the patient moves through the different stages of their insurance plan.   PA #/Case ID/Reference #: [id number]

## 2024-02-18 NOTE — Addendum Note (Signed)
 Addended by: PERCY ROSALINE HERO on: 02/18/2024 05:08 PM   Modules accepted: Orders

## 2024-02-18 NOTE — Telephone Encounter (Signed)
 Pharmacy Patient Advocate Encounter   Received notification from Pt Calls Messages that prior authorization for Nexlizet  is required/requested.   Insurance verification completed.   The patient is insured through [facility].   Per test claim: PA required; PA submitted to above mentioned insurance via Latent Key/confirmation #/[id number] Status is pending

## 2024-02-26 ENCOUNTER — Ambulatory Visit: Admitting: Family Medicine

## 2024-03-09 ENCOUNTER — Ambulatory Visit: Payer: 59 | Admitting: Hematology and Oncology

## 2024-03-17 ENCOUNTER — Ambulatory Visit: Admitting: Family Medicine

## 2024-03-23 ENCOUNTER — Inpatient Hospital Stay: Admitting: Hematology and Oncology

## 2024-03-25 ENCOUNTER — Ambulatory Visit: Admitting: Family Medicine

## 2024-04-05 ENCOUNTER — Ambulatory Visit

## 2024-04-08 ENCOUNTER — Ambulatory Visit: Admitting: Gastroenterology

## 2025-02-01 ENCOUNTER — Ambulatory Visit: Admitting: Internal Medicine
